# Patient Record
Sex: Female | Born: 1975 | Race: Black or African American | Hispanic: No | Marital: Single | Smoking: Current some day smoker
Health system: Southern US, Community
[De-identification: ages and names within clinical notes are randomized; demographics above are authoritative.]

## PROBLEM LIST (undated history)

## (undated) DIAGNOSIS — F419 Anxiety disorder, unspecified: Secondary | ICD-10-CM

## (undated) DIAGNOSIS — F329 Major depressive disorder, single episode, unspecified: Secondary | ICD-10-CM

## (undated) DIAGNOSIS — K3184 Gastroparesis: Secondary | ICD-10-CM

## (undated) DIAGNOSIS — I10 Essential (primary) hypertension: Secondary | ICD-10-CM

## (undated) DIAGNOSIS — E119 Type 2 diabetes mellitus without complications: Secondary | ICD-10-CM

## (undated) HISTORY — DX: Gastroparesis: K31.84

## (undated) HISTORY — PX: COLONOSCOPY: SHX174

## (undated) HISTORY — PX: APPENDECTOMY: SHX54

## (undated) HISTORY — PX: UPPER GASTROINTESTINAL ENDOSCOPY: SHX188

## (undated) HISTORY — DX: Major depressive disorder, single episode, unspecified: F32.9

## (undated) HISTORY — PX: ENDOMETRIAL ABLATION: SHX621

## (undated) HISTORY — PX: HERNIA REPAIR: SHX51

## (undated) HISTORY — PX: TUBAL LIGATION: SHX77

---

## 1997-08-09 ENCOUNTER — Other Ambulatory Visit: Admission: RE | Admit: 1997-08-09 | Discharge: 1997-08-09 | Payer: Self-pay | Admitting: Obstetrics

## 1998-01-01 ENCOUNTER — Encounter: Admission: RE | Admit: 1998-01-01 | Discharge: 1998-01-01 | Payer: Self-pay | Admitting: Family Medicine

## 1998-01-23 ENCOUNTER — Encounter: Admission: RE | Admit: 1998-01-23 | Discharge: 1998-01-23 | Payer: Self-pay | Admitting: Family Medicine

## 1998-05-12 ENCOUNTER — Emergency Department (HOSPITAL_COMMUNITY): Admission: EM | Admit: 1998-05-12 | Discharge: 1998-05-12 | Payer: Self-pay | Admitting: Emergency Medicine

## 1998-06-25 ENCOUNTER — Encounter: Admission: RE | Admit: 1998-06-25 | Discharge: 1998-06-25 | Payer: Self-pay | Admitting: Family Medicine

## 1998-07-01 ENCOUNTER — Other Ambulatory Visit: Admission: RE | Admit: 1998-07-01 | Discharge: 1998-07-01 | Payer: Self-pay | Admitting: Sports Medicine

## 1998-07-01 ENCOUNTER — Encounter: Admission: RE | Admit: 1998-07-01 | Discharge: 1998-07-01 | Payer: Self-pay | Admitting: Family Medicine

## 1998-07-15 ENCOUNTER — Encounter: Admission: RE | Admit: 1998-07-15 | Discharge: 1998-07-15 | Payer: Self-pay | Admitting: Family Medicine

## 1998-11-20 ENCOUNTER — Encounter: Admission: RE | Admit: 1998-11-20 | Discharge: 1998-11-20 | Payer: Self-pay | Admitting: Family Medicine

## 1999-04-08 ENCOUNTER — Encounter: Admission: RE | Admit: 1999-04-08 | Discharge: 1999-04-08 | Payer: Self-pay | Admitting: Family Medicine

## 1999-04-24 ENCOUNTER — Encounter: Admission: RE | Admit: 1999-04-24 | Discharge: 1999-04-24 | Payer: Self-pay | Admitting: Family Medicine

## 1999-06-14 ENCOUNTER — Emergency Department (HOSPITAL_COMMUNITY): Admission: EM | Admit: 1999-06-14 | Discharge: 1999-06-15 | Payer: Self-pay | Admitting: *Deleted

## 1999-08-18 ENCOUNTER — Other Ambulatory Visit: Admission: RE | Admit: 1999-08-18 | Discharge: 1999-08-18 | Payer: Self-pay | Admitting: Obstetrics

## 1999-09-08 ENCOUNTER — Inpatient Hospital Stay (HOSPITAL_COMMUNITY): Admission: AD | Admit: 1999-09-08 | Discharge: 1999-09-08 | Payer: Self-pay | Admitting: Obstetrics

## 2000-03-02 ENCOUNTER — Inpatient Hospital Stay (HOSPITAL_COMMUNITY): Admission: AD | Admit: 2000-03-02 | Discharge: 2000-03-02 | Payer: Self-pay | Admitting: Obstetrics

## 2000-03-18 ENCOUNTER — Inpatient Hospital Stay (HOSPITAL_COMMUNITY): Admission: AD | Admit: 2000-03-18 | Discharge: 2000-03-21 | Payer: Self-pay | Admitting: Obstetrics

## 2000-10-12 ENCOUNTER — Emergency Department (HOSPITAL_COMMUNITY): Admission: EM | Admit: 2000-10-12 | Discharge: 2000-10-12 | Payer: Self-pay | Admitting: Emergency Medicine

## 2000-10-12 ENCOUNTER — Encounter: Payer: Self-pay | Admitting: Emergency Medicine

## 2001-12-03 ENCOUNTER — Emergency Department (HOSPITAL_COMMUNITY): Admission: EM | Admit: 2001-12-03 | Discharge: 2001-12-03 | Payer: Self-pay | Admitting: Emergency Medicine

## 2002-06-07 ENCOUNTER — Other Ambulatory Visit: Admission: RE | Admit: 2002-06-07 | Discharge: 2002-06-07 | Payer: Self-pay | Admitting: Obstetrics and Gynecology

## 2004-04-17 ENCOUNTER — Ambulatory Visit (HOSPITAL_COMMUNITY): Admission: RE | Admit: 2004-04-17 | Discharge: 2004-04-17 | Payer: Self-pay | Admitting: Obstetrics & Gynecology

## 2004-10-14 ENCOUNTER — Observation Stay (HOSPITAL_COMMUNITY): Admission: AD | Admit: 2004-10-14 | Discharge: 2004-10-15 | Payer: Self-pay | Admitting: Obstetrics & Gynecology

## 2004-11-25 ENCOUNTER — Ambulatory Visit (HOSPITAL_COMMUNITY): Admission: RE | Admit: 2004-11-25 | Discharge: 2004-11-25 | Payer: Self-pay | Admitting: Obstetrics & Gynecology

## 2004-11-27 ENCOUNTER — Inpatient Hospital Stay (HOSPITAL_COMMUNITY): Admission: RE | Admit: 2004-11-27 | Discharge: 2004-11-30 | Payer: Self-pay | Admitting: Obstetrics & Gynecology

## 2004-11-27 ENCOUNTER — Encounter (INDEPENDENT_AMBULATORY_CARE_PROVIDER_SITE_OTHER): Payer: Self-pay | Admitting: Specialist

## 2004-12-09 ENCOUNTER — Encounter: Admission: RE | Admit: 2004-12-09 | Discharge: 2005-01-08 | Payer: Self-pay | Admitting: Obstetrics & Gynecology

## 2005-01-09 ENCOUNTER — Encounter: Admission: RE | Admit: 2005-01-09 | Discharge: 2005-02-07 | Payer: Self-pay | Admitting: Obstetrics & Gynecology

## 2005-02-08 ENCOUNTER — Encounter: Admission: RE | Admit: 2005-02-08 | Discharge: 2005-03-10 | Payer: Self-pay | Admitting: Obstetrics & Gynecology

## 2005-03-11 ENCOUNTER — Encounter: Admission: RE | Admit: 2005-03-11 | Discharge: 2005-04-09 | Payer: Self-pay | Admitting: Obstetrics & Gynecology

## 2005-04-10 ENCOUNTER — Encounter: Admission: RE | Admit: 2005-04-10 | Discharge: 2005-05-10 | Payer: Self-pay | Admitting: Obstetrics & Gynecology

## 2005-05-11 ENCOUNTER — Encounter: Admission: RE | Admit: 2005-05-11 | Discharge: 2005-06-10 | Payer: Self-pay | Admitting: Obstetrics & Gynecology

## 2005-06-10 IMAGING — US US OB TRANSVAGINAL MODIFY
1 series · 18 of 28 positions shown · non-contrast
Comparison: none

CLINICAL DATA: Uncertain gestational age.
 EARLY OBSTETRICAL ULTRASOUND WITH TRANSVAGINAL:
 Transabdominal and endovaginal images were obtained.
 There is a single living intrauterine gestation with crown rump length indicating a gestational age of 6 weeks 4 days and an EDC of 12/07/04.  The fetal heart rate is regular and within normal limits, measuring 109 bpm.  A small remote subchorionic hemorrhage is noted measuring approximately 1 cm in greatest width.
 Both ovaries have a normal appearance and there is no free pelvic fluid.

[Series 1: us ob comp<14 wk · 18 of 38 slices shown]
[im 1/38]
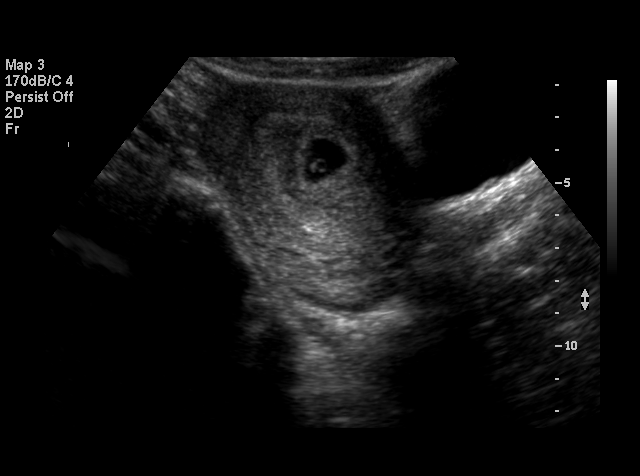
[im 3/38]
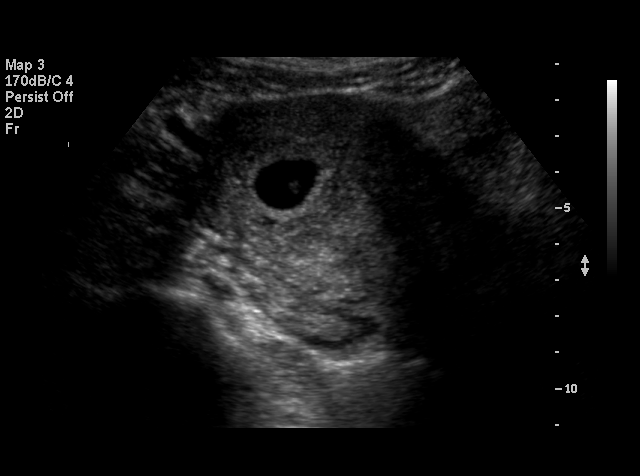
[im 5/38]
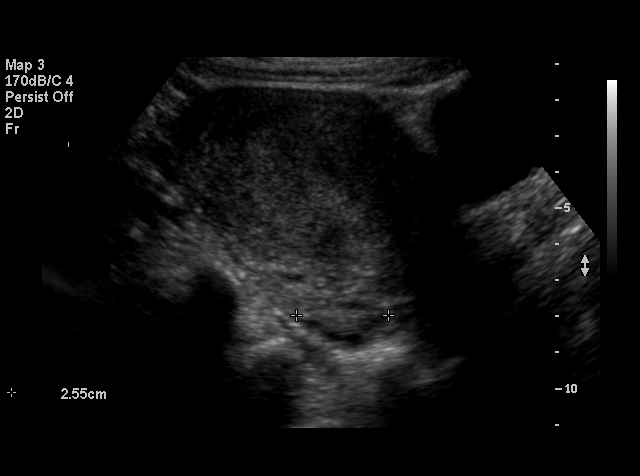
[im 7/38]
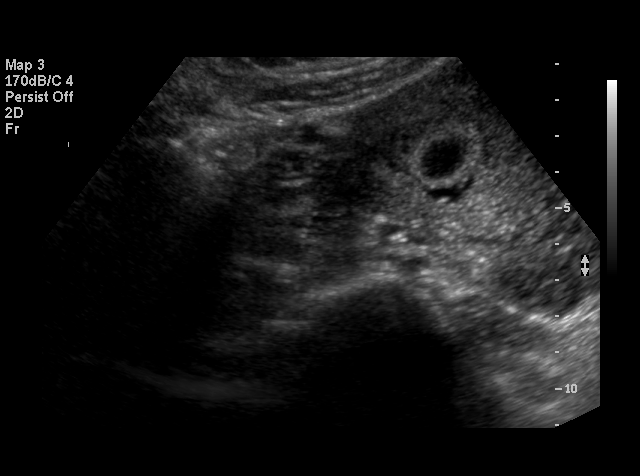
[im 10/38]
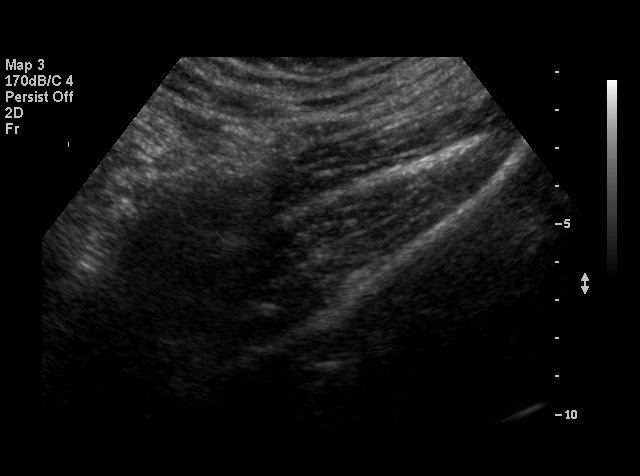
[im 11/38]
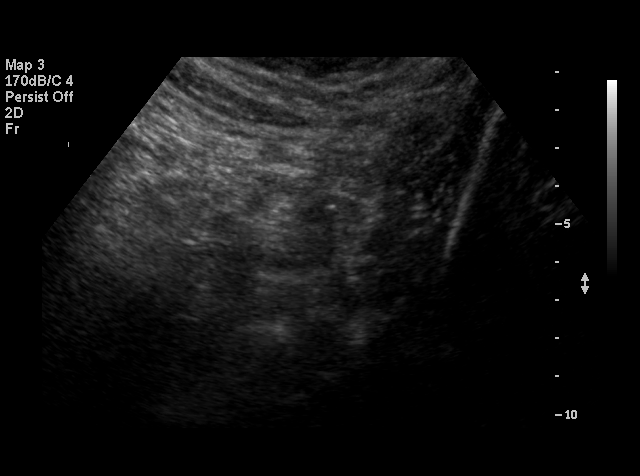
[im 14/38]
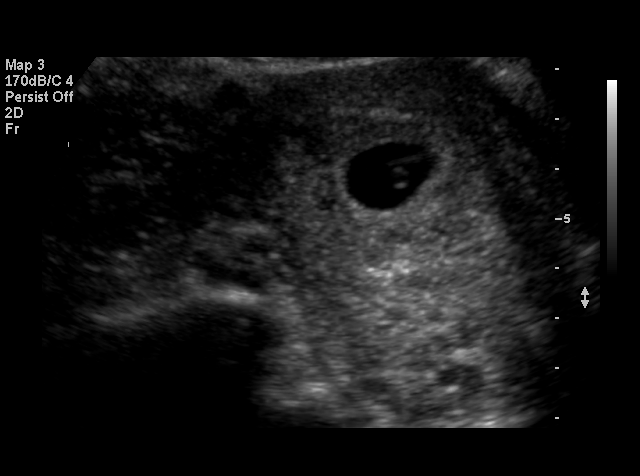
[im 16/38]
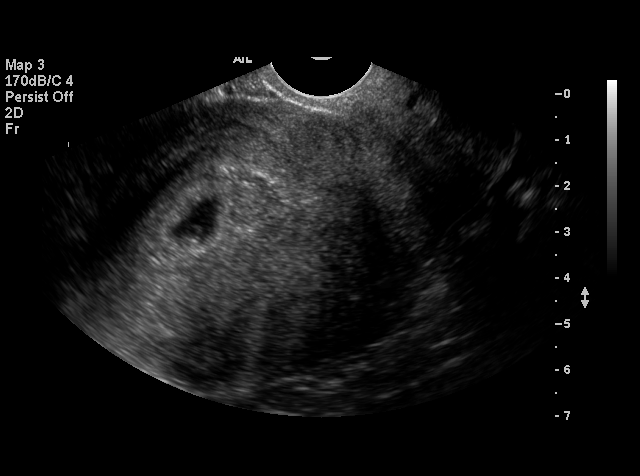
[im 18/38]
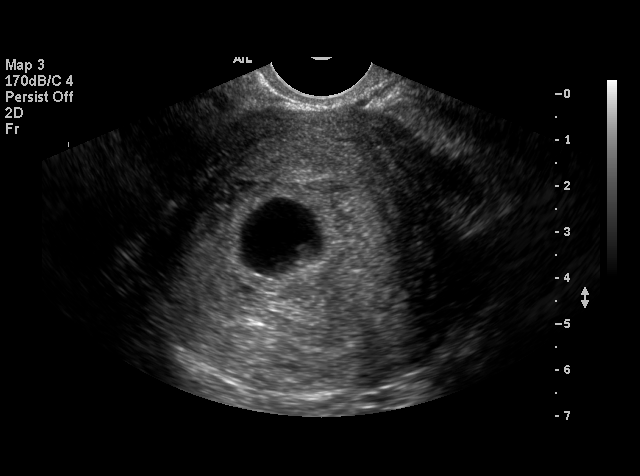
[im 20/38]
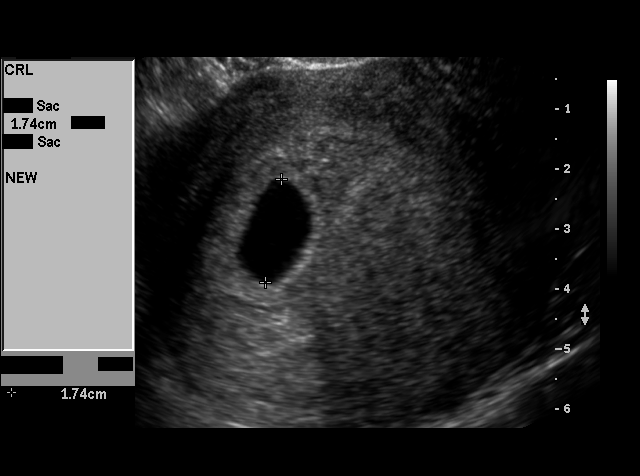
[im 22/38]
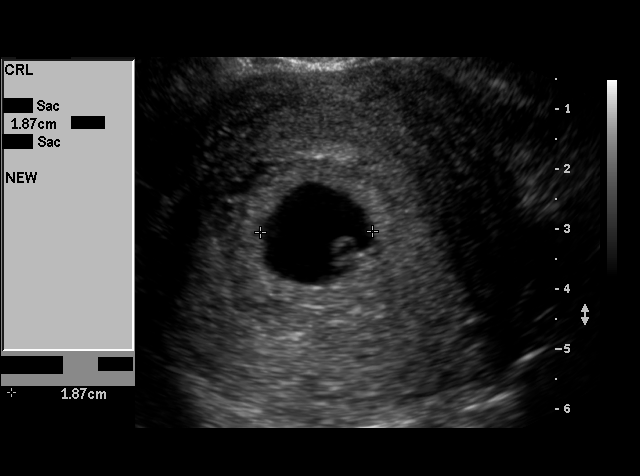
[im 24/38]
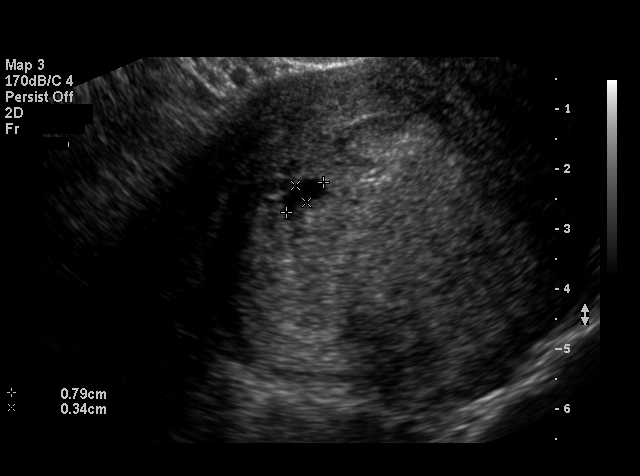
[im 27/38]
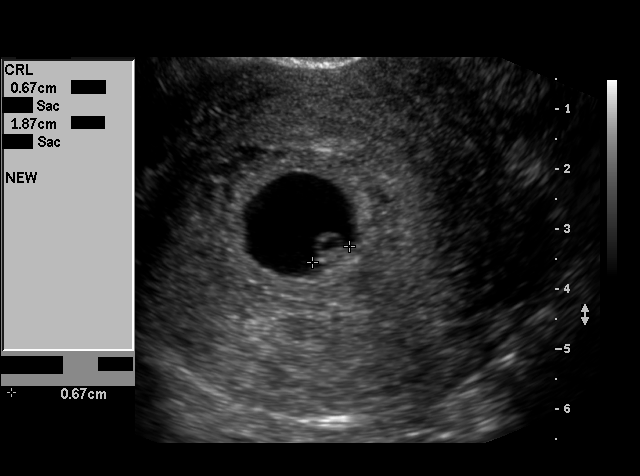
[im 29/38]
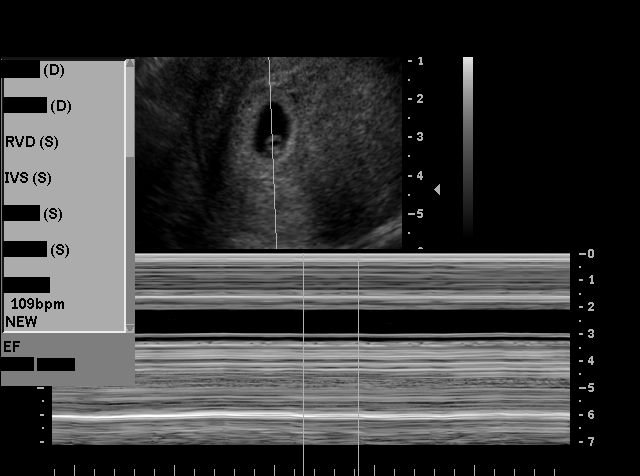
[im 31/38]
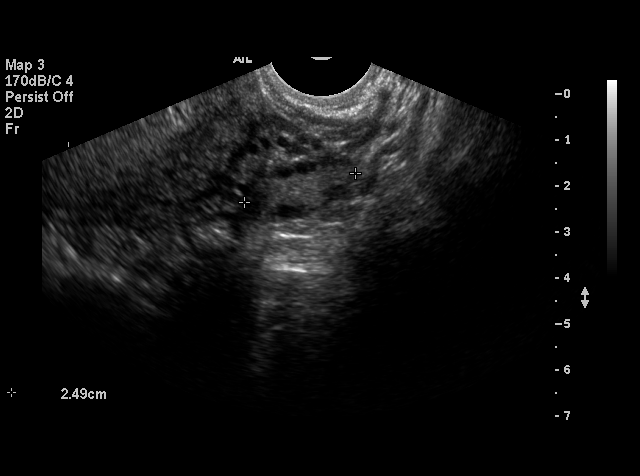
[im 33/38]
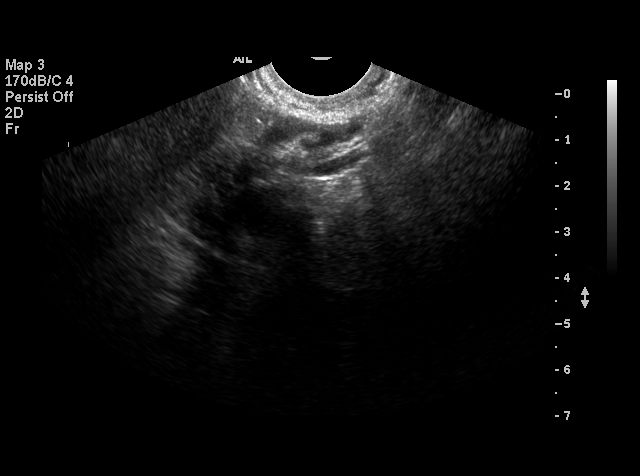
[im 35/38]
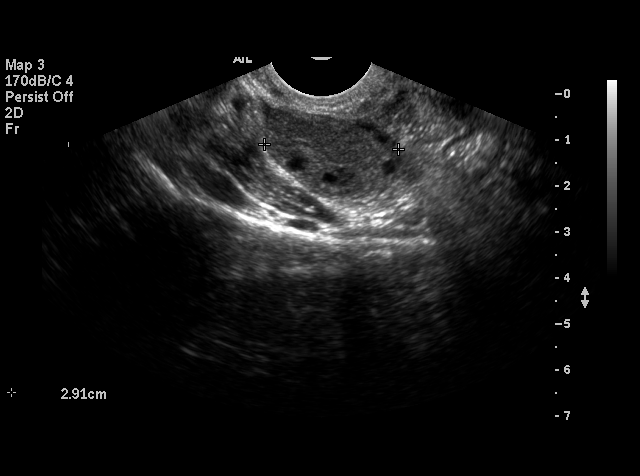
[im 38/38]
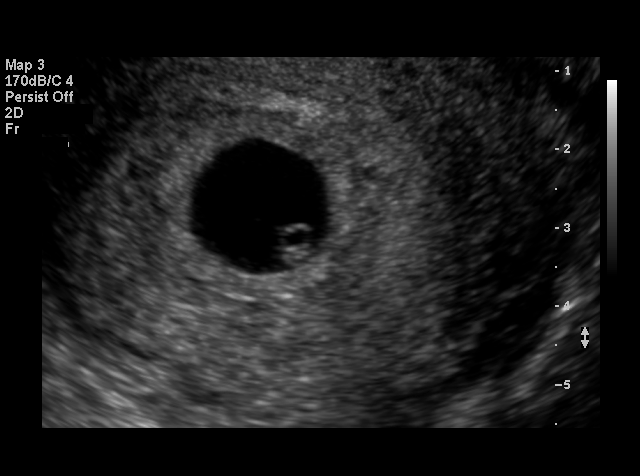

[18 of 28 positions shown; findings below may reference images not displayed]

IMPRESSION: 1.  Single living intrauterine gestation with a crown rump length indicating a gestational age of 6 weeks 4 days and an EDC of 12/07/04.
 2.  Complete anatomy scan at 19 ? 21 weeks is recommended.

## 2005-06-11 ENCOUNTER — Encounter: Admission: RE | Admit: 2005-06-11 | Discharge: 2005-07-08 | Payer: Self-pay | Admitting: Obstetrics & Gynecology

## 2005-07-09 ENCOUNTER — Encounter: Admission: RE | Admit: 2005-07-09 | Discharge: 2005-08-08 | Payer: Self-pay | Admitting: Obstetrics & Gynecology

## 2005-08-09 ENCOUNTER — Encounter: Admission: RE | Admit: 2005-08-09 | Discharge: 2005-09-07 | Payer: Self-pay | Admitting: Obstetrics & Gynecology

## 2005-09-08 ENCOUNTER — Encounter: Admission: RE | Admit: 2005-09-08 | Discharge: 2005-10-08 | Payer: Self-pay | Admitting: Obstetrics & Gynecology

## 2007-10-12 ENCOUNTER — Ambulatory Visit (HOSPITAL_COMMUNITY): Admission: RE | Admit: 2007-10-12 | Discharge: 2007-10-12 | Payer: Self-pay | Admitting: Obstetrics and Gynecology

## 2007-10-12 ENCOUNTER — Encounter (INDEPENDENT_AMBULATORY_CARE_PROVIDER_SITE_OTHER): Payer: Self-pay | Admitting: Obstetrics and Gynecology

## 2009-12-23 ENCOUNTER — Emergency Department (HOSPITAL_COMMUNITY)
Admission: EM | Admit: 2009-12-23 | Discharge: 2009-12-23 | Payer: Self-pay | Source: Home / Self Care | Admitting: Family Medicine

## 2010-07-10 LAB — HIV ANTIBODY (ROUTINE TESTING W REFLEX): HIV: NONREACTIVE

## 2010-07-10 LAB — POCT URINALYSIS DIPSTICK
Glucose, UA: NEGATIVE mg/dL
Nitrite: NEGATIVE
Protein, ur: NEGATIVE mg/dL
Urobilinogen, UA: 0.2 mg/dL (ref 0.0–1.0)

## 2010-07-10 LAB — GC/CHLAMYDIA PROBE AMP, GENITAL
Chlamydia, DNA Probe: NEGATIVE
GC Probe Amp, Genital: NEGATIVE

## 2010-07-10 LAB — WET PREP, GENITAL: Trich, Wet Prep: NONE SEEN

## 2010-07-10 LAB — POCT PREGNANCY, URINE: Preg Test, Ur: NEGATIVE

## 2010-09-08 NOTE — Op Note (Signed)
NAMEJASIYA, MARKIE NO.:  [id number]   MEDICAL RECORD NO.:  [id number]          PATIENT TYPE:  AMB   LOCATION:  SDC                           FACILITY:  [facility]   PHYSICIAN:  Lenoard Aden, M.D.DATE OF BIRTH:  Apr 07, 1976   DATE OF PROCEDURE:  10/12/2007  DATE OF DISCHARGE:                               OPERATIVE REPORT   PREOPERATIVE DIAGNOSES:  Menorrhagia and dysmenorrhea.   POSTOPERATIVE DIAGNOSES:  Menorrhagia, dysmenorrhea, plus anterior wall  uterine endometrial polyp.   PROCEDURE:  Diagnostic hysteroscopy, dilation and curettage,  polypectomy, NovaSure endometrial ablation.   ANESTHESIA:  General.   BLOOD LOSS:  Less than 2 mL.   SURGEON:  Lenoard Aden, MD.   COMPLICATIONS:  None.   DRAINS:  None.   COUNTS:  Correct.   Patient to recovery in good condition.   SPECIMEN:  Endometrial curettings to pathology.   Fluid deficit of 50 mL.   BRIEF OPERATIVE NOTE:  Being apprised of risks of anesthesia, infection,  bleeding, injury to abdominal organs, need for repair, the patient was  brought to the operating room where she was administered general  anesthetic without complications, prepped and draped in the usual  sterile fashion.  Catheter was placed.  Bladder was emptied.  Exam under  anesthesia reveals anteflexed uterus and no adnexal masses.  Dilute  Pitressin solution 16 mL total placed at 3 and 9 o'clock cervicovaginal  junction, dilute paracervical block 30 mL.  Scope placed in standard  fashion at 4 and 8 o' clock.  A single-tooth tenaculum was placed,  grasped with the cervix.  Uterus sounds to 9 cm, cervical length of 2  cm.  Cervix was easily dilated up to a #27 Pratt dilator.  Hysteroscope  was placed.  Visualization of a large anterior wall polyp, bilateral  normal tubal ostia.  At this time, polyp forceps was placed and removal  of endometrial polyp was done without difficulty.  D&C was performed.  Endometrium curettings were  visualized.  Revisualization in the cavity  reveals the complete removal of the anterior wall endometrial polyp.  At  this time, NovaSure device was placed and seated in the proper fashion.  CO2 test was performed and was negative.  Cavity width of 4 cm noted.  At this time, NovaSure was initiated and terminated as noted.  Instruments were removed  without difficulty.  Revisualization reveals a well-ablated endometrium  and no evidence of perforation.  Good hemostasis was achieved.  All  incisions were removed.  The patient tolerated the procedure and was  transferred to recovery room in good condition.      Lenoard Aden, M.D.  Electronically Signed     RJT/MEDQ  D:  10/12/2007  T:  10/13/2007  Job:  [id number]

## 2010-09-08 NOTE — H&P (Signed)
NAMEROMEY, COHEA NO.:  [id number]   MEDICAL RECORD NO.:  [id number]          PATIENT TYPE:  AMB   LOCATION:  SDC                           FACILITY:  [facility]   PHYSICIAN:  Lenoard Aden, M.D.DATE OF BIRTH:  1975-06-19   DATE OF ADMISSION:  DATE OF DISCHARGE:                              HISTORY & PHYSICAL   CHIEF COMPLAINT:  Dysmenorrhea, menorrhagia.   She is a 35 year old African-American female, G7, P4, who presents for  NovaSure, hysteroscopy, D&C.   She has no known drug allergies.   Medications previously were phentermine.   She is a nonsmoker, nondrinker.  Denies domestic or physical violence.   History of three vaginal deliveries and one C-section.  History of tubal  ligation.   PHYSICAL EXAMINATION:  She is a well-developed and well-nourished  African-American female in no acute distress.  HEENT:  Normal.  LUNGS:  Clear.  HEART:  Regular rate and rhythm.  ABDOMEN:  Soft, nontender.  PELVIC:  The uterus to be normal size, shape, and contour, bulky,  slightly enlarged.  No adnexal masses.  Uterine position is anteverted.   IMPRESSION:  Dysmenorrhea, menorrhagia with secondary to anemia.  Previous hemoglobin 10.2.   PLAN:  Proceed with diagnostic hysteroscopy, D&C, possible resectoscopic  polypectomy, NovaSure endometrial ablation.  The risks of anesthesia,  infection, bleeding, injury to intra-abdominal organs,  need for  __________ is discussed.  __________ complications, to include bowel and  bladder injury are noted, possible need for laparotomy with need for  repair of injured abdominal organs discussed, and possible inability to  cure pelvic pain and dysmenorrhea noted.  Patient acknowledges and  wishes to proceed.      Lenoard Aden, M.D.  Electronically Signed     RJT/MEDQ  D:  10/11/2007  T:  10/11/2007  Job:  [id number]

## 2010-09-11 NOTE — Op Note (Signed)
NAMEGURNEET, Gwendolyn Carrillo NO.:  [id number]   MEDICAL RECORD NO.:  [id number]          PATIENT TYPE:  INP   LOCATION:  9117                          FACILITY:  [facility]   PHYSICIAN:  Roseanna Rainbow, M.D.DATE OF BIRTH:  1975/12/03   DATE OF PROCEDURE:  11/27/2004  DATE OF DISCHARGE:                                 OPERATIVE REPORT   PREOPERATIVE DIAGNOSES:  1.  Intrauterine pregnancy at 38 plus weeks.  2.  Gestational diabetes, agent-requiring.  3.  Suspected large for gestational age fetus.  4.  Desires sterilization.   POSTOPERATIVE DIAGNOSES:  1.  Intrauterine pregnancy at 38 plus weeks.  2.  Gestational diabetes, agent-requiring.  3.  Suspected large for gestational age fetus.  4.  Desires sterilization.   PROCEDURES:  1.  Primary low uterine flap elliptical cesarean delivery.  2.  Modified Pomeroy bilateral tubal ligation via a Pfannenstiel skin      incision.   SURGEON:  Roseanna Rainbow, M.D.   ASSISTANT:  Bing Neighbors. Clearance Coots, M.D.   ANESTHESIA:  Spinal.   ESTIMATED BLOOD LOSS:  600 mL.   IV FLUIDS:  As per anesthesiology.   COMPLICATIONS:  None.   PROCEDURE:  The patient was taken to the operating room with an IV running.  A spinal anesthetic was then placed without difficulty.  The patient was  placed in the dorsal supine position with a leftward tilt and prepped and  draped in the usual sterile fashion.  A Pfannenstiel skin incision was then  made with a scalpel and carried down to the underlying fascia with the  Bovie.  The fascia was nicked in the midline.  The fascial incision was then  extended bilaterally with curved Mayo scissors.  The superior aspect of the  fascial incision was then tented up with Kocher clamps and the underlying  rectus muscles dissected off.  The inferior aspect of the fascial incision  was manipulated in a similar fashion.  The rectus muscles were separated in  midline.  The parietal peritoneum was tented up  and entered sharply.  The  peritoneal incision was then extended superiorly and inferiorly with good  visualization of the bladder.  The bladder blade was then placed.  The  vesicouterine peritoneum was tented up and entered sharply with Metzenbaum  scissors. This incision was then extended bilaterally and the bladder flap  created bluntly.  The lower uterine segment was then incised in a transverse  fashion with the scalpel.  The uterine incision was extended with bandage  scissors.  The infant's head was then delivered atraumatically.  The  oropharynx was suctioned with bulb suction.  The cord was clamped and cut.  The infant was handed off to the awaiting neonatologist.  Apgars were 9 and  9 at one and five minutes, respectively.  The placenta was then removed.  The intrauterine cavity was evacuated of any remaining amniotic fluid, clots  and debris with moistened laparotomy sponge.  The uterus was then  exteriorized.  The uterine incision was reapproximated in a running  interlocking fashion using 0 Monocryl.  Adequate hemostasis  was noted.  At  this point the midisthmic portion of the left fallopian tube was then  grasped with a Babcock clamp.  Two ligatures of 0 plain were placed around a  2 cm segment of tube.  The tube was excised.  Adequate hemostasis was noted.  The right fallopian tube was manipulated in a similar fashion.  The uterus  was returned to the abdomen.  The paracolic gutters were copiously  irrigated.  The parietal peritoneum was reapproximated in a running fashion  with 2-0 Vicryl.  The fascia was reapproximated in a running fashion with 0  Vicryl.  The skin was reapproximated with staples.  At the close of the  procedure, the instrument and pack counts were said to be correct x2.  Cefazolin 1 g was given at cord clamp.  The patient was taken to the PACU  awake and in stable condition.       LAJ/MEDQ  D:  11/27/2004  T:  11/27/2004  Job:  [id number]

## 2011-01-21 LAB — CBC
HCT: 35 — ABNORMAL LOW
Platelets: 271
RDW: 14.1
WBC: 8

## 2011-10-01 ENCOUNTER — Emergency Department (HOSPITAL_COMMUNITY)
Admission: EM | Admit: 2011-10-01 | Discharge: 2011-10-01 | Disposition: A | Discharge status: Home / Self Care | Payer: 59 | Attending: Emergency Medicine | Admitting: Emergency Medicine

## 2011-10-01 ENCOUNTER — Encounter (HOSPITAL_COMMUNITY): Payer: Self-pay | Admitting: *Deleted

## 2011-10-01 DIAGNOSIS — R0602 Shortness of breath: Secondary | ICD-10-CM | POA: Insufficient documentation

## 2011-10-01 DIAGNOSIS — F17200 Nicotine dependence, unspecified, uncomplicated: Secondary | ICD-10-CM | POA: Insufficient documentation

## 2011-10-01 DIAGNOSIS — I471 Supraventricular tachycardia, unspecified: Secondary | ICD-10-CM | POA: Insufficient documentation

## 2011-10-01 DIAGNOSIS — I10 Essential (primary) hypertension: Secondary | ICD-10-CM | POA: Insufficient documentation

## 2011-10-01 DIAGNOSIS — F411 Generalized anxiety disorder: Secondary | ICD-10-CM | POA: Insufficient documentation

## 2011-10-01 DIAGNOSIS — R079 Chest pain, unspecified: Secondary | ICD-10-CM | POA: Insufficient documentation

## 2011-10-01 DIAGNOSIS — Z79899 Other long term (current) drug therapy: Secondary | ICD-10-CM | POA: Insufficient documentation

## 2011-10-01 DIAGNOSIS — I951 Orthostatic hypotension: Secondary | ICD-10-CM | POA: Insufficient documentation

## 2011-10-01 HISTORY — DX: Essential (primary) hypertension: I10

## 2011-10-01 HISTORY — DX: Anxiety disorder, unspecified: F41.9

## 2011-10-01 LAB — CBC
Hemoglobin: 13.2 g/dL (ref 12.0–15.0)
MCH: 31.4 pg (ref 26.0–34.0)
Platelets: 360 10*3/uL (ref 150–400)
RBC: 4.21 MIL/uL (ref 3.87–5.11)
WBC: 13.4 10*3/uL — ABNORMAL HIGH (ref 4.0–10.5)

## 2011-10-01 LAB — BASIC METABOLIC PANEL
BUN: 14 mg/dL (ref 6–23)
CO2: 22 mEq/L (ref 19–32)
Chloride: 103 mEq/L (ref 96–112)
GFR calc non Af Amer: >90 mL/min (ref >90)
Glucose, Bld: 114 mg/dL — ABNORMAL HIGH (ref 70–99)
Potassium: 3.6 mEq/L (ref 3.5–5.1)
Sodium: 138 mEq/L (ref 135–145)

## 2011-10-01 LAB — POCT I-STAT TROPONIN I

## 2011-10-01 LAB — DIFFERENTIAL
Lymphocytes Relative: 29 % (ref 12–46)
Lymphs Abs: 3.9 10*3/uL (ref 0.7–4.0)
Monocytes Relative: 8 % (ref 3–12)
Neutro Abs: 8.5 10*3/uL — ABNORMAL HIGH (ref 1.7–7.7)
Neutrophils Relative %: 63 % (ref 43–77)

## 2011-10-01 MED ORDER — METOPROLOL SUCCINATE ER 50 MG PO TB24: 50.0000 mg | ORAL_TABLET | Freq: Every day | ORAL | Status: DC

## 2011-10-01 MED ORDER — SODIUM CHLORIDE 0.9 % IV BOLUS (SEPSIS)
1000.0000 mL | Freq: Once | INTRAVENOUS | Status: AC
  Administered 2011-10-01: 1000 mL via INTRAVENOUS

## 2011-10-01 MED ORDER — ADENOSINE 6 MG/2ML IV SOLN
INTRAVENOUS | Status: AC
  Administered 2011-10-01: 6 mg via INTRAVENOUS
  Filled 2011-10-01: qty 6

## 2011-10-01 MED ORDER — ADENOSINE 6 MG/2ML IV SOLN
6.0000 mg | Freq: Once | INTRAVENOUS | Status: AC
  Administered 2011-10-01: 6 mg via INTRAVENOUS

## 2011-10-01 NOTE — ED Notes (Signed)
UVO:[id number]<YQ> Expected date:<BR> Expected time:11:35 AM<BR> Means of arrival:<BR> Comments:<BR> M61 - 36yoF Anxiety attack

## 2011-10-01 NOTE — ED Notes (Signed)
Pt reports left chest pain/pressure that started last night, states "I thought it was just anxiety but it will not go away." pt reports pressure to left chest that radiates to left shoulder. Also c/o SOB and dizziness while driving to ER, pt had to call EMS to transport her here.

## 2011-10-01 NOTE — ED Notes (Signed)
Adenosine given, pt converted to ST no ectopy noted. Pt reports relief of chest pain/pressure. States "I feel much better now."

## 2011-10-01 NOTE — ED Provider Notes (Addendum)
History     CSN: [id number]  Arrival date & time 10/01/11  1138   First MD Initiated Contact with Patient 10/01/11 1145      Chief Complaint  Patient presents with  . Anxiety  . Chest Pain    (Consider location/radiation/quality/duration/timing/severity/associated sxs/prior treatment) Patient is a 36 y.o. female presenting with anxiety and chest pain. The history is provided by the patient and the EMS personnel. The history is limited by the condition of the patient.  Anxiety Associated symptoms include chest pain.  Chest Pain   the pt is a 43 y female with hx htn and anxiety attacks.  She c/o anxiety, fast hr, cp, sob since last night. She denies n/v/f/c/cough.   She smokes cigars. She has not used OTC cold meds recently.  She does not drink a lot of caffeine.  Level 5 caveat applies for urgent need for intervention.   PSVT with HR 202 and mild hypotension.  Past Medical History  Diagnosis Date  . Hypertension   . Anxiety     Past Surgical History  Procedure Date  . Endometrial ablation   . Cesarean section     History reviewed. No pertinent family history.  History  Substance Use Topics  . Smoking status: Current Everyday Smoker    Types: Cigars  . Smokeless tobacco: Not on file  . Alcohol Use: Yes     occ    OB History    Grav Para Term Preterm Abortions TAB SAB Ect Mult Living                  Review of Systems  Unable to perform ROS Cardiovascular: Positive for chest pain.    Allergies  Review of patient's allergies indicates no known allergies.  Home Medications   Current Outpatient Rx  Name Route Sig Dispense Refill  . LISINOPRIL 10 MG PO TABS Oral Take 10 mg by mouth daily.    Gaylord Shih TRI-CYCLEN (28) PO Oral Take 1 tablet by mouth daily.      BP 94/64  Pulse 101  Resp 18  SpO2 100%  Physical Exam  Nursing note and vitals reviewed. Constitutional: She is oriented to person, place, and time. She appears well-developed and  well-nourished. No distress.  HENT:  Head: Normocephalic and atraumatic.  Eyes: Conjunctivae are normal.  Neck: Normal range of motion.  Cardiovascular: Regular rhythm.   No murmur heard.      tachycardia  Pulmonary/Chest: Effort normal. She has no rales.  Abdominal: Soft. Bowel sounds are normal. She exhibits no distension.  Musculoskeletal: Normal range of motion.  Neurological: She is alert and oriented to person, place, and time.  Skin: Skin is warm and dry.  Psychiatric: She has a normal mood and affect. Thought content normal.    ED Course  Procedures (including critical care time) PSVT Resolved with IV adenosine 6 mg.   Labs Reviewed  CBC  DIFFERENTIAL  BASIC METABOLIC PANEL   No results found.   No diagnosis found.  ECG PSVT with heart rate 202 beats per minute. Normal axis. Normal intervals. Nonspecific T-wave changes. PSVT  ECG after adenosine Sinus tachycardia with heart rate 118 beats per minute. Normal axis. Normal intervals. Normal.  ST and T waves. Sinus tachycardia  No orthostatic sxs after IVF and rate control  I spoke with Dr. Sharyn Lull. He said stop licinopril hctz and start toprol.  F/u in office next week.  MDM  psvt        Tinnie Gens  Mickaela Starlin, MD 10/01/11 1610  Cheri Guppy, MD 10/01/11 1525

## 2011-10-01 NOTE — Discharge Instructions (Signed)
Your blood tests do not show any damage to your heart.  Stop your lisinopril.  Use Toprol to control your heart rate.  Followup with Dr. her only next Monday or Tuesday.  Call for appointment time.  Return for worse or uncontrolled symptoms

## 2011-10-01 NOTE — ED Notes (Signed)
Pts HR noted to be 205. EKG captured and shown to Dr. Weldon Inches. Dr. Weldon Inches at bedside to eval pt. VORBV for Adenosine.

## 2011-12-15 ENCOUNTER — Emergency Department (HOSPITAL_COMMUNITY)
Admission: EM | Admit: 2011-12-15 | Discharge: 2011-12-15 | Disposition: A | Discharge status: Home / Self Care | Payer: 59 | Attending: Emergency Medicine | Admitting: Emergency Medicine

## 2011-12-15 DIAGNOSIS — Z79899 Other long term (current) drug therapy: Secondary | ICD-10-CM | POA: Insufficient documentation

## 2011-12-15 DIAGNOSIS — I471 Supraventricular tachycardia: Secondary | ICD-10-CM

## 2011-12-15 DIAGNOSIS — I10 Essential (primary) hypertension: Secondary | ICD-10-CM | POA: Insufficient documentation

## 2011-12-15 DIAGNOSIS — F411 Generalized anxiety disorder: Secondary | ICD-10-CM | POA: Insufficient documentation

## 2011-12-15 DIAGNOSIS — R42 Dizziness and giddiness: Secondary | ICD-10-CM | POA: Insufficient documentation

## 2011-12-15 DIAGNOSIS — R079 Chest pain, unspecified: Secondary | ICD-10-CM | POA: Insufficient documentation

## 2011-12-15 DIAGNOSIS — I498 Other specified cardiac arrhythmias: Secondary | ICD-10-CM | POA: Insufficient documentation

## 2011-12-15 DIAGNOSIS — F17200 Nicotine dependence, unspecified, uncomplicated: Secondary | ICD-10-CM | POA: Insufficient documentation

## 2011-12-15 MED ORDER — ADENOSINE 6 MG/2ML IV SOLN
INTRAVENOUS | Status: AC
  Administered 2011-12-15: 6 mg via INTRAVENOUS
  Filled 2011-12-15: qty 6

## 2011-12-15 NOTE — ED Notes (Signed)
EDP gave permission for pt to have something to eat and drink

## 2011-12-15 NOTE — ED Notes (Signed)
MD at bedside. 

## 2011-12-15 NOTE — ED Notes (Signed)
Pt states she feels like her heart is racing since 9:30 pm. States has had episode of "tachycardia" in June with HR 205.  Also feels chest tightness, SOB, and dizzy.  In SVT with rate 229

## 2011-12-15 NOTE — ED Provider Notes (Signed)
History     CSN: [id number]  Arrival date & time 12/15/11  0129   First MD Initiated Contact with Patient 12/15/11 [phone]      Chief Complaint  Patient presents with  . SVT   . Chest Pain    (Consider location/radiation/quality/duration/timing/severity/associated sxs/prior treatment) HPI 36 year old female presents to emergency department complaining of palpitations. Patient with history of SVT, required adenosine cardioversion in June. Patient reports since around 9:30 she has felt her heart was racing. She initially thought was a panic attack, but found herself to the emergency department was not improving. She is complaining of some shortness of breath and mild chest pressure patient feels dizziness. Patient had workup through Dr. Sharyn Lull, reports he was discussing possible ablation with her, but admits that she doesn't understand his accent very well. Patient was told to cut back on her Adderall use. She took a pill on Friday and then again today. She reports work has been very stressful recently and she is having trouble being a single mom and working a stressful job.  Past Medical History  Diagnosis Date  . Hypertension   . Anxiety     Past Surgical History  Procedure Date  . Endometrial ablation   . Cesarean section     No family history on file.  History  Substance Use Topics  . Smoking status: Current Everyday Smoker    Types: Cigars  . Smokeless tobacco: Not on file  . Alcohol Use: Yes     occ    OB History    Grav Para Term Preterm Abortions TAB SAB Ect Mult Living                  Review of Systems  All other systems reviewed and are negative.    Allergies  Review of patient's allergies indicates no known allergies.  Home Medications   Current Outpatient Rx  Name Route Sig Dispense Refill  . ACETAMINOPHEN 500 MG PO TABS Oral Take 1,000 mg by mouth every 6 (six) hours as needed. For pain.    Marland Kitchen AMPHETAMINE-DEXTROAMPHETAMINE 10 MG PO TABS Oral Take  10 mg by mouth 2 (two) times daily.    Marland Kitchen LISINOPRIL-HYDROCHLOROTHIAZIDE 20-12.5 MG PO TABS Oral Take 1 tablet by mouth daily.    Gaylord Shih TRI-CYCLEN (28) PO Oral Take 1 tablet by mouth daily.    Marland Kitchen VITAMIN C 500 MG PO TABS Oral Take 1,000 mg by mouth daily.      BP 115/72  Pulse 117  Temp 98.6 F (37 C) (Oral)  Resp 18  SpO2 100%  Physical Exam  Nursing note and vitals reviewed. Constitutional: She is oriented to person, place, and time. She appears well-developed and well-nourished.  HENT:  Head: Normocephalic and atraumatic.  Nose: Nose normal.  Mouth/Throat: Oropharynx is clear and moist.  Eyes: Conjunctivae and EOM are normal. Pupils are equal, round, and reactive to light.  Neck: Normal range of motion. Neck supple. No JVD present. No tracheal deviation present. No thyromegaly present.  Cardiovascular: Regular rhythm, normal heart sounds and intact distal pulses.  Exam reveals no gallop and no friction rub.   No murmur heard.      Tachycardia noted  Pulmonary/Chest: Effort normal and breath sounds normal. No stridor. No respiratory distress. She has no wheezes. She has no rales. She exhibits no tenderness.  Abdominal: Soft. Bowel sounds are normal. She exhibits no distension and no mass. There is no tenderness. There is no rebound and no guarding.  Musculoskeletal: Normal range of motion. She exhibits no edema and no tenderness.  Lymphadenopathy:    She has no cervical adenopathy.  Neurological: She is alert and oriented to person, place, and time. She has normal reflexes. She exhibits normal muscle tone. Coordination normal.  Skin: Skin is warm and dry. No rash noted. No erythema. No pallor.  Psychiatric: She has a normal mood and affect. Her behavior is normal. Judgment and thought content normal.    ED Course  CARDIOVERSION Performed by: Olivia Mackie Authorized by: Olivia Mackie Consent: The procedure was performed in an emergent situation. Risks and benefits: risks,  benefits and alternatives were discussed Consent given by: patient Time out: Immediately prior to procedure a "time out" was called to verify the correct patient, procedure, equipment, support staff and site/side marked as required. Patient sedated: no Cardioversion basis: emergent Pre-procedure rhythm: supraventricular tachycardia Patient position: patient was placed in a supine position Post-procedure rhythm: normal sinus rhythm Patient tolerance: Patient tolerated the procedure well with no immediate complications. Comments: Patient was given 6 mg adenosine rapid push with resolution of SVT   (including critical care time) CRITICAL CARE Performed by: Olivia Mackie   Total critical care time: 35  Critical care time was exclusive of separately billable procedures and treating other patients.  Critical care was necessary to treat or prevent imminent or life-threatening deterioration.  Critical care was time spent personally by me on the following activities: development of treatment plan with patient and/or surrogate as well as nursing, discussions with consultants, evaluation of patient's response to treatment, examination of patient, obtaining history from patient or surrogate, ordering and performing treatments and interventions, ordering and review of laboratory studies, ordering and review of radiographic studies, pulse oximetry and re-evaluation of patient's condition.  Labs Reviewed - No data to display No results found.  Pre adenosine:  Date: 12/15/2011  Rate: 231   Rhythm: sinus tachycardia  QRS Axis: right  Intervals: normal  ST/T Wave abnormalities: nonspecific ST/T changes  Conduction Disutrbances:none  Narrative Interpretation: svt new from prior  Old EKG Reviewed: changes noted   Date: 12/15/2011  Rate: 117  Rhythm: sinus tachycardia  QRS Axis: right  Intervals: normal  ST/T Wave abnormalities: normal  Conduction Disutrbances:none  Narrative Interpretation:  svt resolved  Old EKG Reviewed: changes noted     1. SVT (supraventricular tachycardia)       MDM  36 yo female s/p SVT episode with good response to adenosine x 1.  Discussed with patient avoidance of stimulants and f/u with cardiology. She requests referral to different cardiology group.        Olivia Mackie, MD 12/15/11 [phone]

## 2011-12-15 NOTE — ED Notes (Signed)
Patient placed on Zoll monitor

## 2011-12-15 NOTE — ED Notes (Signed)
Pt states she was feeling her heart racing since 9:30pm. Pt states "It felt like my heart was beating to my back". PT denies chest pain after administration of adenosine 6mg .

## 2012-08-28 ENCOUNTER — Telehealth: Payer: Self-pay | Admitting: Family Medicine

## 2012-08-30 MED ORDER — METOPROLOL SUCCINATE ER 50 MG PO TB24: 50.0000 mg | ORAL_TABLET | Freq: Every day | ORAL | Status: DC

## 2012-08-30 NOTE — Telephone Encounter (Signed)
Rx Refilled  

## 2012-11-06 ENCOUNTER — Telehealth: Payer: Self-pay | Admitting: Family Medicine

## 2012-11-06 MED ORDER — METOPROLOL SUCCINATE ER 50 MG PO TB24: 50.0000 mg | ORAL_TABLET | Freq: Every day | ORAL | Status: DC

## 2012-11-06 NOTE — Telephone Encounter (Signed)
Meds refilled.

## 2012-12-04 ENCOUNTER — Telehealth: Payer: Self-pay | Admitting: Family Medicine

## 2012-12-04 MED ORDER — METOPROLOL SUCCINATE ER 50 MG PO TB24: 50.0000 mg | ORAL_TABLET | Freq: Every day | ORAL | Status: DC

## 2012-12-04 NOTE — Telephone Encounter (Signed)
Rx Refilled for 90 days 

## 2014-05-19 DIAGNOSIS — Z9049 Acquired absence of other specified parts of digestive tract: Secondary | ICD-10-CM | POA: Insufficient documentation

## 2018-07-04 ENCOUNTER — Other Ambulatory Visit: Payer: Self-pay

## 2018-07-04 ENCOUNTER — Emergency Department (HOSPITAL_COMMUNITY): Payer: 59

## 2018-07-04 ENCOUNTER — Encounter (HOSPITAL_COMMUNITY): Payer: Self-pay | Admitting: Emergency Medicine

## 2018-07-04 ENCOUNTER — Emergency Department (HOSPITAL_COMMUNITY)
Admission: EM | Admit: 2018-07-04 | Discharge: 2018-07-04 | Disposition: A | Discharge status: Home / Self Care | Payer: 59 | Attending: Emergency Medicine | Admitting: Emergency Medicine

## 2018-07-04 DIAGNOSIS — J101 Influenza due to other identified influenza virus with other respiratory manifestations: Secondary | ICD-10-CM

## 2018-07-04 DIAGNOSIS — I10 Essential (primary) hypertension: Secondary | ICD-10-CM | POA: Diagnosis not present

## 2018-07-04 DIAGNOSIS — F419 Anxiety disorder, unspecified: Secondary | ICD-10-CM | POA: Diagnosis not present

## 2018-07-04 DIAGNOSIS — R0602 Shortness of breath: Secondary | ICD-10-CM | POA: Diagnosis not present

## 2018-07-04 DIAGNOSIS — F17210 Nicotine dependence, cigarettes, uncomplicated: Secondary | ICD-10-CM | POA: Insufficient documentation

## 2018-07-04 DIAGNOSIS — E1165 Type 2 diabetes mellitus with hyperglycemia: Secondary | ICD-10-CM | POA: Diagnosis present

## 2018-07-04 HISTORY — DX: Type 2 diabetes mellitus without complications: E11.9

## 2018-07-04 LAB — INFLUENZA PANEL BY PCR (TYPE A & B)
INFLAPCR: POSITIVE — AB
Influenza B By PCR: NEGATIVE

## 2018-07-04 LAB — URINALYSIS, ROUTINE W REFLEX MICROSCOPIC
BACTERIA UA: NONE SEEN
BILIRUBIN URINE: NEGATIVE
Glucose, UA: >=500 mg/dL — AB
Ketones, ur: 20 mg/dL — AB
Leukocytes,Ua: NEGATIVE
Nitrite: NEGATIVE
PH: 6 (ref 5.0–8.0)
Protein, ur: NEGATIVE mg/dL
SPECIFIC GRAVITY, URINE: 1.025 (ref 1.005–1.030)

## 2018-07-04 LAB — CBC
HEMATOCRIT: 38.2 % (ref 36.0–46.0)
Hemoglobin: 13.5 g/dL (ref 12.0–15.0)
MCH: 31.9 pg (ref 26.0–34.0)
MCHC: 35.3 g/dL (ref 30.0–36.0)
MCV: 90.3 fL (ref 80.0–100.0)
NRBC: 0 % (ref 0.0–0.2)
PLATELETS: 268 10*3/uL (ref 150–400)
RBC: 4.23 MIL/uL (ref 3.87–5.11)
RDW: 11.9 % (ref 11.5–15.5)
WBC: 8.5 10*3/uL (ref 4.0–10.5)

## 2018-07-04 LAB — BASIC METABOLIC PANEL
Anion gap: 15 (ref 5–15)
BUN: 19 mg/dL (ref 6–20)
CALCIUM: 9.5 mg/dL (ref 8.9–10.3)
CHLORIDE: 91 mmol/L — AB (ref 98–111)
CO2: 23 mmol/L (ref 22–32)
CREATININE: 0.84 mg/dL (ref 0.44–1.00)
GFR calc non Af Amer: >60 mL/min (ref >60)
Glucose, Bld: 364 mg/dL — ABNORMAL HIGH (ref 70–99)
Potassium: 3.5 mmol/L (ref 3.5–5.1)
Sodium: 129 mmol/L — ABNORMAL LOW (ref 135–145)

## 2018-07-04 LAB — CBG MONITORING, ED: GLUCOSE-CAPILLARY: 375 mg/dL — AB (ref 70–99)

## 2018-07-04 LAB — I-STAT TROPONIN, ED: TROPONIN I, POC: 0 ng/mL (ref 0.00–0.08)

## 2018-07-04 LAB — I-STAT BETA HCG BLOOD, ED (MC, WL, AP ONLY)

## 2018-07-04 MED ORDER — OSELTAMIVIR PHOSPHATE 75 MG PO CAPS
75.0000 mg | ORAL_CAPSULE | Freq: Once | ORAL | Status: AC
  Administered 2018-07-04: 75 mg via ORAL
  Filled 2018-07-04: qty 1

## 2018-07-04 MED ORDER — ACETAMINOPHEN 500 MG PO TABS
1000.0000 mg | ORAL_TABLET | Freq: Once | ORAL | Status: AC
  Administered 2018-07-04: 1000 mg via ORAL
  Filled 2018-07-04: qty 2

## 2018-07-04 MED ORDER — OSELTAMIVIR PHOSPHATE 75 MG PO CAPS: 75.0000 mg | ORAL_CAPSULE | Freq: Two times a day (BID) | ORAL | 0 refills | Status: DC

## 2018-07-04 MED ORDER — SODIUM CHLORIDE 0.9 % IV BOLUS
1000.0000 mL | Freq: Once | INTRAVENOUS | Status: AC
  Administered 2018-07-04: 1000 mL via INTRAVENOUS

## 2018-07-04 MED ORDER — ONDANSETRON HCL 4 MG/2ML IJ SOLN
4.0000 mg | Freq: Once | INTRAMUSCULAR | Status: AC
  Administered 2018-07-04: 4 mg via INTRAVENOUS
  Filled 2018-07-04: qty 2

## 2018-07-04 MED ORDER — GLIPIZIDE ER 5 MG PO TB24: 5.0000 mg | ORAL_TABLET | Freq: Every day | ORAL | 1 refills | Status: DC

## 2018-07-04 NOTE — ED Notes (Signed)
CBG is 375.

## 2018-07-04 NOTE — ED Notes (Signed)
Pt aware of UA need 

## 2018-07-04 NOTE — Discharge Instructions (Addendum)
You were evaluated in the Emergency Department and after careful evaluation, we did not find any emergent condition requiring admission or further testing in the hospital.  Your symptoms today seem to be due to the flu.  Please take the Tamiflu medication as directed.  We have also sent a refill of your glipizide to your pharmacy, please restart this medication.  Continue to drink plenty of fluids at home.  Please return to the Emergency Department if you experience any worsening of your condition.  We encourage you to follow up with a primary care provider.  Thank you for allowing [place] to be a part of your care.

## 2018-07-04 NOTE — ED Notes (Signed)
Patient verbalizes understanding of discharge instructions. Opportunity for questioning and answers were provided. 

## 2018-07-04 NOTE — ED Provider Notes (Signed)
[facility] Emergency Department Provider Note MRN:  [id number]  Arrival date & time: 07/04/18     Chief Complaint   Hyperglycemia and Tachycardia   History of Present Illness   Gwendolyn Carrillo is a 43 y.o. year-old female with a history of diabetes, hypertension presenting to the ED with chief complaint of hyperglycemia.  Patient explains that she lost weight, about 42 pounds last year, which helped her get off insulin and helped her to only have to take her glipizide every other day.  Her prescription ran out a month ago and she decided to try to go without.  No diabetes medications for the past month.  Patient endorsing cold-like symptoms last week that seemed to go away, but now seem to have returned today.  Body aches, cough, general malaise, elevated heart rate, drinking a lot of water, frequent urination, denies fever, mild dull frontal headache, no neck pain, dyspnea on exertion, no chest pain, no abdominal pain.  Symptoms are constant, moderate, no exacerbating relieving factors.  Review of Systems  A complete 10 system review of systems was obtained and all systems are negative except as noted in the HPI and PMH.   Patient's Health History    Past Medical History:  Diagnosis Date  . Anxiety   . Diabetes mellitus without complication (HCC)   . Hypertension     Past Surgical History:  Procedure Laterality Date  . CESAREAN SECTION    . ENDOMETRIAL ABLATION      No family history on file.  Social History   Socioeconomic History  . Marital status: Married    Spouse name: Not on file  . Number of children: Not on file  . Years of education: Not on file  . Highest education level: Not on file  Occupational History  . Not on file  Social Needs  . Financial resource strain: Not on file  . Food insecurity:    Worry: Not on file    Inability: Not on file  . Transportation needs:    Medical: Not on file    Non-medical: Not on file  Tobacco Use  .  Smoking status: Current Every Day Smoker    Types: Cigars  . Smokeless tobacco: Never Used  Substance and Sexual Activity  . Alcohol use: Yes    Comment: occ  . Drug use: No  . Sexual activity: Not Currently  Lifestyle  . Physical activity:    Days per week: Not on file    Minutes per session: Not on file  . Stress: Not on file  Relationships  . Social connections:    Talks on phone: Not on file    Gets together: Not on file    Attends religious service: Not on file    Active member of club or organization: Not on file    Attends meetings of clubs or organizations: Not on file    Relationship status: Not on file  . Intimate partner violence:    Fear of current or ex partner: Not on file    Emotionally abused: Not on file    Physically abused: Not on file    Forced sexual activity: Not on file  Other Topics Concern  . Not on file  Social History Narrative  . Not on file     Physical Exam  Vital Signs and Nursing Notes reviewed Vitals:   07/04/18 1200 07/04/18 1215  BP: 127/80 129/77  Pulse: (!) 113 (!) 114  Resp: 19 19  Temp:    SpO2: 97% 97%    CONSTITUTIONAL: Well-appearing, NAD NEURO:  Alert and oriented x 3, no focal deficits EYES:  eyes equal and reactive ENT/NECK:  no LAD, no JVD CARDIO: Tachycardic rate, well-perfused, normal S1 and S2 PULM:  CTAB no wheezing or rhonchi GI/GU:  normal bowel sounds, non-distended, non-tender MSK/SPINE:  No gross deformities, no edema SKIN:  no rash, atraumatic PSYCH:  Appropriate speech and behavior  Diagnostic and Interventional Summary    EKG Interpretation  Date/Time:  Tuesday July 04 2018 08:59:35 EDT Ventricular Rate:  115 PR Interval:    QRS Duration: 87 QT Interval:  329 QTC Calculation: 455 R Axis:   86 Text Interpretation:  Sinus tachycardia Consider right atrial enlargement Confirmed by Kennis Carina [phone]) on 07/04/2018 9:21:22 AM      Labs Reviewed  BASIC METABOLIC PANEL - Abnormal; Notable for  the following components:      Result Value   Sodium 129 (*)    Chloride 91 (*)    Glucose, Bld 364 (*)    All other components within normal limits  URINALYSIS, ROUTINE W REFLEX MICROSCOPIC - Abnormal; Notable for the following components:   Glucose, UA >=500 (*)    Hgb urine dipstick SMALL (*)    Ketones, ur 20 (*)    All other components within normal limits  INFLUENZA PANEL BY PCR (TYPE A & B) - Abnormal; Notable for the following components:   Influenza A By PCR POSITIVE (*)    All other components within normal limits  CBG MONITORING, ED - Abnormal; Notable for the following components:   Glucose-Capillary 375 (*)    All other components within normal limits  CBC  I-STAT BETA HCG BLOOD, ED ([facility], [facility], [facility] ONLY)  I-STAT TROPONIN, ED    DG Chest 2 View  Final Result      Medications  sodium chloride 0.9 % bolus 1,000 mL (0 mLs Intravenous Stopped 07/04/18 1054)  ondansetron (ZOFRAN) injection 4 mg (4 mg Intravenous Given 07/04/18 0927)  sodium chloride 0.9 % bolus 1,000 mL (1,000 mLs Intravenous New Bag/Given 07/04/18 1145)  oseltamivir (TAMIFLU) capsule 75 mg (75 mg Oral Given 07/04/18 1145)  acetaminophen (TYLENOL) tablet 1,000 mg (1,000 mg Oral Given 07/04/18 1145)     Procedures Critical Care  ED Course and Medical Decision Making  I have reviewed the triage vital signs and the nursing notes.  Pertinent labs & imaging results that were available during my care of the patient were reviewed by me and considered in my medical decision making (see below for details).  Considering DKA in this 43 year old female who has had no diabetes medications for the past month, also exhibiting infectious symptoms, considering pneumonia versus viral process.  Work-up pending.  No evidence of DKA, hyperglycemia is mild, provided with 2 L IV fluids.  Influenza A positive.  No meningismus.  Mild fever with mild tachycardia here in the ED but well-appearing.  Heart rate improving with fluids.   Appropriate for outpatient management with Tamiflu, continued hydration at home, advised to restart her glipizide.  After the discussed management above, the patient was determined to be safe for discharge.  The patient was in agreement with this plan and all questions regarding their care were answered.  ED return precautions were discussed and the patient will return to the ED with any significant worsening of condition.  Elmer Sow. Pilar Plate, MD [facility] Emergency Medicine [facility] [email or web address]  Final Clinical Impressions(s) / ED  Diagnoses     ICD-10-CM   1. Influenza A J10.1   2. SOB (shortness of breath) R06.02 DG Chest 2 View    DG Chest 2 View    ED Discharge Orders         Ordered    oseltamivir (TAMIFLU) 75 MG capsule  Every 12 hours     07/04/18 1220    glipiZIDE (GLUCOTROL XL) 5 MG 24 hr tablet  Daily with breakfast     07/04/18 1220             Sabas Sous, MD 07/04/18 1224

## 2018-07-04 NOTE — ED Triage Notes (Signed)
Pt reports high blood sugars in the 400's and a rapid heart beat. Pt reports this started yesterday.

## 2019-06-14 ENCOUNTER — Telehealth: Payer: Self-pay | Admitting: Oncology

## 2019-06-14 NOTE — Telephone Encounter (Signed)
Received a new hem referral from Dr. Holley Bouche for elevated white blood cell count. Gwendolyn Carrillo has been cld and scheduled to see Dr. Clelia Croft on 3/4 at 2pm. Pt aware to arrive 15 minutes early.

## 2019-06-28 ENCOUNTER — Other Ambulatory Visit: Payer: Self-pay

## 2019-06-28 ENCOUNTER — Inpatient Hospital Stay: Payer: 59 | Attending: Oncology | Admitting: Oncology

## 2019-06-28 VITALS — BP 122/81 | HR 84 | Temp 97.6°F | Resp 18 | Ht 64.0 in | Wt 149.9 lb

## 2019-06-28 DIAGNOSIS — D72819 Decreased white blood cell count, unspecified: Secondary | ICD-10-CM | POA: Insufficient documentation

## 2019-06-28 DIAGNOSIS — E119 Type 2 diabetes mellitus without complications: Secondary | ICD-10-CM | POA: Insufficient documentation

## 2019-06-28 DIAGNOSIS — D72829 Elevated white blood cell count, unspecified: Secondary | ICD-10-CM

## 2019-06-28 DIAGNOSIS — I10 Essential (primary) hypertension: Secondary | ICD-10-CM | POA: Insufficient documentation

## 2019-06-28 DIAGNOSIS — Z7984 Long term (current) use of oral hypoglycemic drugs: Secondary | ICD-10-CM | POA: Insufficient documentation

## 2019-06-28 DIAGNOSIS — F419 Anxiety disorder, unspecified: Secondary | ICD-10-CM | POA: Insufficient documentation

## 2019-06-28 DIAGNOSIS — Z79899 Other long term (current) drug therapy: Secondary | ICD-10-CM | POA: Diagnosis not present

## 2019-06-28 DIAGNOSIS — F17210 Nicotine dependence, cigarettes, uncomplicated: Secondary | ICD-10-CM | POA: Insufficient documentation

## 2019-06-28 NOTE — Progress Notes (Signed)
Reason for the request: Leukocytopenia.  HPI: I was asked by Dr. Earney Navy to evaluate Gwendolyn Carrillo for leukocytopenia.  She is a 44 year old woman with history of hypertension, diabetes and anxiety who was found to have leukocytosis based on laboratory testing in January 2021.  At that time her white cell count was 13.6 with nearly hemoglobin and normal platelet count.  Her differential was normal at that time.  Previous says CBC dating back to 2009 showed fluctuating white cell count as high as 13.9 back in 2013.  Clinically, she reports no complaints at this time.  She does smoke 5 cigarettes a day and has done so for last 7 years.  She denies any lymphadenopathy, weight loss or appetite changes.  Continues to work full-time without any decline in ability to do so.  She does not report any headaches, blurry vision, syncope or seizures. Does not report any fevers, chills or sweats.  Does not report any cough, wheezing or hemoptysis.  Does not report any chest pain, palpitation, orthopnea or leg edema.  Does not report any nausea, vomiting or abdominal pain.  Does not report any constipation or diarrhea.  Does not report any skeletal complaints.    Does not report frequency, urgency or hematuria.  Does not report any skin rashes or lesions. Does not report any heat or cold intolerance.  Does not report any lymphadenopathy or petechiae.  Does not report any anxiety or depression.  Remaining review of systems is negative.    Past Medical History:  Diagnosis Date  . Anxiety   . Diabetes mellitus without complication (HCC)   . Hypertension   :  Past Surgical History:  Procedure Laterality Date  . CESAREAN SECTION    . ENDOMETRIAL ABLATION    :   Current Outpatient Medications:  .  acetaminophen (TYLENOL) 500 MG tablet, Take 1,000 mg by mouth every 6 (six) hours as needed. For pain., Disp: , Rfl:  .  glipiZIDE (GLUCOTROL XL) 5 MG 24 hr tablet, Take 1 tablet (5 mg total) by mouth daily with  breakfast., Disp: 30 tablet, Rfl: 1 .  hydrochlorothiazide (HYDRODIURIL) 50 MG tablet, Take 50 mg by mouth daily., Disp: , Rfl:  .  ibuprofen (ADVIL,MOTRIN) 200 MG tablet, Take 400 mg by mouth as needed for moderate pain., Disp: , Rfl:  .  lisinopril (PRINIVIL,ZESTRIL) 20 MG tablet, Take 20 mg by mouth daily., Disp: , Rfl:  .  metoprolol succinate (TOPROL-XL) 50 MG 24 hr tablet, Take 1 tablet (50 mg total) by mouth daily. Take with or immediately following a meal. (Patient not taking: Reported on 07/04/2018), Disp: 90 tablet, Rfl: 1 .  Omega-3 Fatty Acids (FISH OIL) 1000 MG CAPS, Take 2,000 mg by mouth daily., Disp: , Rfl:  .  omeprazole (PRILOSEC) 40 MG capsule, Take 40 mg by mouth daily., Disp: , Rfl:  .  oseltamivir (TAMIFLU) 75 MG capsule, Take 1 capsule (75 mg total) by mouth every 12 (twelve) hours., Disp: 10 capsule, Rfl: 0 .  vitamin C (ASCORBIC ACID) 500 MG tablet, Take 1,000 mg by mouth daily., Disp: , Rfl: :  No Known Allergies:  No family history on file.:  Social History   Socioeconomic History  . Marital status: Married    Spouse name: Not on file  . Number of children: Not on file  . Years of education: Not on file  . Highest education level: Not on file  Occupational History  . Not on file  Tobacco Use  . Smoking  status: Current Every Day Smoker    Types: Cigars  . Smokeless tobacco: Never Used  Substance and Sexual Activity  . Alcohol use: Yes    Comment: occ  . Drug use: No  . Sexual activity: Not Currently  Other Topics Concern  . Not on file  Social History Narrative  . Not on file   Social Determinants of Health   Financial Resource Strain:   . Difficulty of Paying Living Expenses: Not on file  Food Insecurity:   . Worried About Charity fundraiser in the Last Year: Not on file  . Ran Out of Food in the Last Year: Not on file  Transportation Needs:   . Lack of Transportation (Medical): Not on file  . Lack of Transportation (Non-Medical): Not on file   Physical Activity:   . Days of Exercise per Week: Not on file  . Minutes of Exercise per Session: Not on file  Stress:   . Feeling of Stress : Not on file  Social Connections:   . Frequency of Communication with Friends and Family: Not on file  . Frequency of Social Gatherings with Friends and Family: Not on file  . Attends Religious Services: Not on file  . Active Member of Clubs or Organizations: Not on file  . Attends Archivist Meetings: Not on file  . Marital Status: Not on file  Intimate Partner Violence:   . Fear of Current or Ex-Partner: Not on file  . Emotionally Abused: Not on file  . Physically Abused: Not on file  . Sexually Abused: Not on file  :  Pertinent items are noted in HPI.  Exam: Blood pressure 122/81, pulse 84, temperature 97.6 F (36.4 C), resp. rate 18, height 5\' 4"  (1.626 m), weight 149 lb 14.4 oz (68 kg), SpO2 100 %.   ECOG 0 General appearance: alert and cooperative appeared without distress. Head: atraumatic without any abnormalities. Eyes: conjunctivae/corneas clear. PERRL.  Sclera anicteric. Throat: lips, mucosa, and tongue normal; without oral thrush or ulcers. Resp: clear to auscultation bilaterally without rhonchi, wheezes or dullness to percussion. Cardio: regular rate and rhythm, S1, S2 normal, no murmur, click, rub or gallop GI: soft, non-tender; bowel sounds normal; no masses,  no organomegaly Skin: Skin color, texture, turgor normal. No rashes or lesions Lymph nodes: Cervical, supraclavicular, and axillary nodes normal. Neurologic: Grossly normal without any motor, sensory or deep tendon reflexes. Musculoskeletal: No joint deformity or effusion.  CBC    Component Value Date/Time   WBC 8.5 07/04/2018 0818   RBC 4.23 07/04/2018 0818   HGB 13.5 07/04/2018 0818   HCT 38.2 07/04/2018 0818   PLT 268 07/04/2018 0818   MCV 90.3 07/04/2018 0818   MCH 31.9 07/04/2018 0818   MCHC 35.3 07/04/2018 0818   RDW 11.9 07/04/2018 0818    LYMPHSABS 3.9 10/01/2011 1213   MONOABS 1.0 10/01/2011 1213   EOSABS 0.1 10/01/2011 1213   BASOSABS 0.0 10/01/2011 1213   Results for LEILYNN, PILAT (MRN [id number]) as of 06/28/2019 14:03  Ref. Range 10/12/2007 13:12 10/01/2011 12:13 07/04/2018 08:18  WBC Latest Ref Range: 4.0 - 10.5 K/uL 8.0 13.4 (H) 8.5    Assessment and Plan:   44 year old woman with the following:  1.  Leukocytosis dating back at least 06/16/2011 with a white cell count at time was 13.4.  CBC obtained in January 2021 showed similar laboratory findings with white cell count of 13.6 and normal differential.  She had a fluctuating white cell count  in between including normal white cell count of 2020.  The differential diagnosis was reviewed at this time.  Reactive leukocytosis for benign causes is the most likely etiology.  Smoking is certainly contributing to that as well as stress.  Lymphoproliferative disorder or myeloproliferative disorder are considered less likely.  Chronic myelogenous leukemia, marrow fibrosis among other causes are considered less likely.  From a management standpoint, I have recommended period of observation and surveillance and repeat testing in 6 months.  I recommended smoking cessation as well as stress reduction and exercise.    If her CBC shows close to normal range for similar white cell count and I do not recommend any further testing.  If she has persistent elevation or higher increase in her white cell count then a work-up for myeloproliferative disorder may be needed.  2.  Follow-up: will be in 6 months for repeat testing.  45  minutes were dedicated to this visit. The time was spent on reviewing laboratory data, discussing differential diagnosis and answering questions regarding future plan.       A copy of this consult has been forwarded to the requesting physician.

## 2019-06-29 ENCOUNTER — Telehealth: Payer: Self-pay | Admitting: Oncology

## 2019-06-29 NOTE — Telephone Encounter (Signed)
Scheduled appt per 3/4 los.  Spoke with pt and she is aware of her scheduled appt dates and time,

## 2019-08-27 IMAGING — CR CHEST - 2 VIEW
2 series · 2 of 2 positions shown · non-contrast
Comparison: None.

CLINICAL DATA: Shortness of breath.  Chest pain.  Productive cough.

EXAM:
CHEST - 2 VIEW

[chest lat]
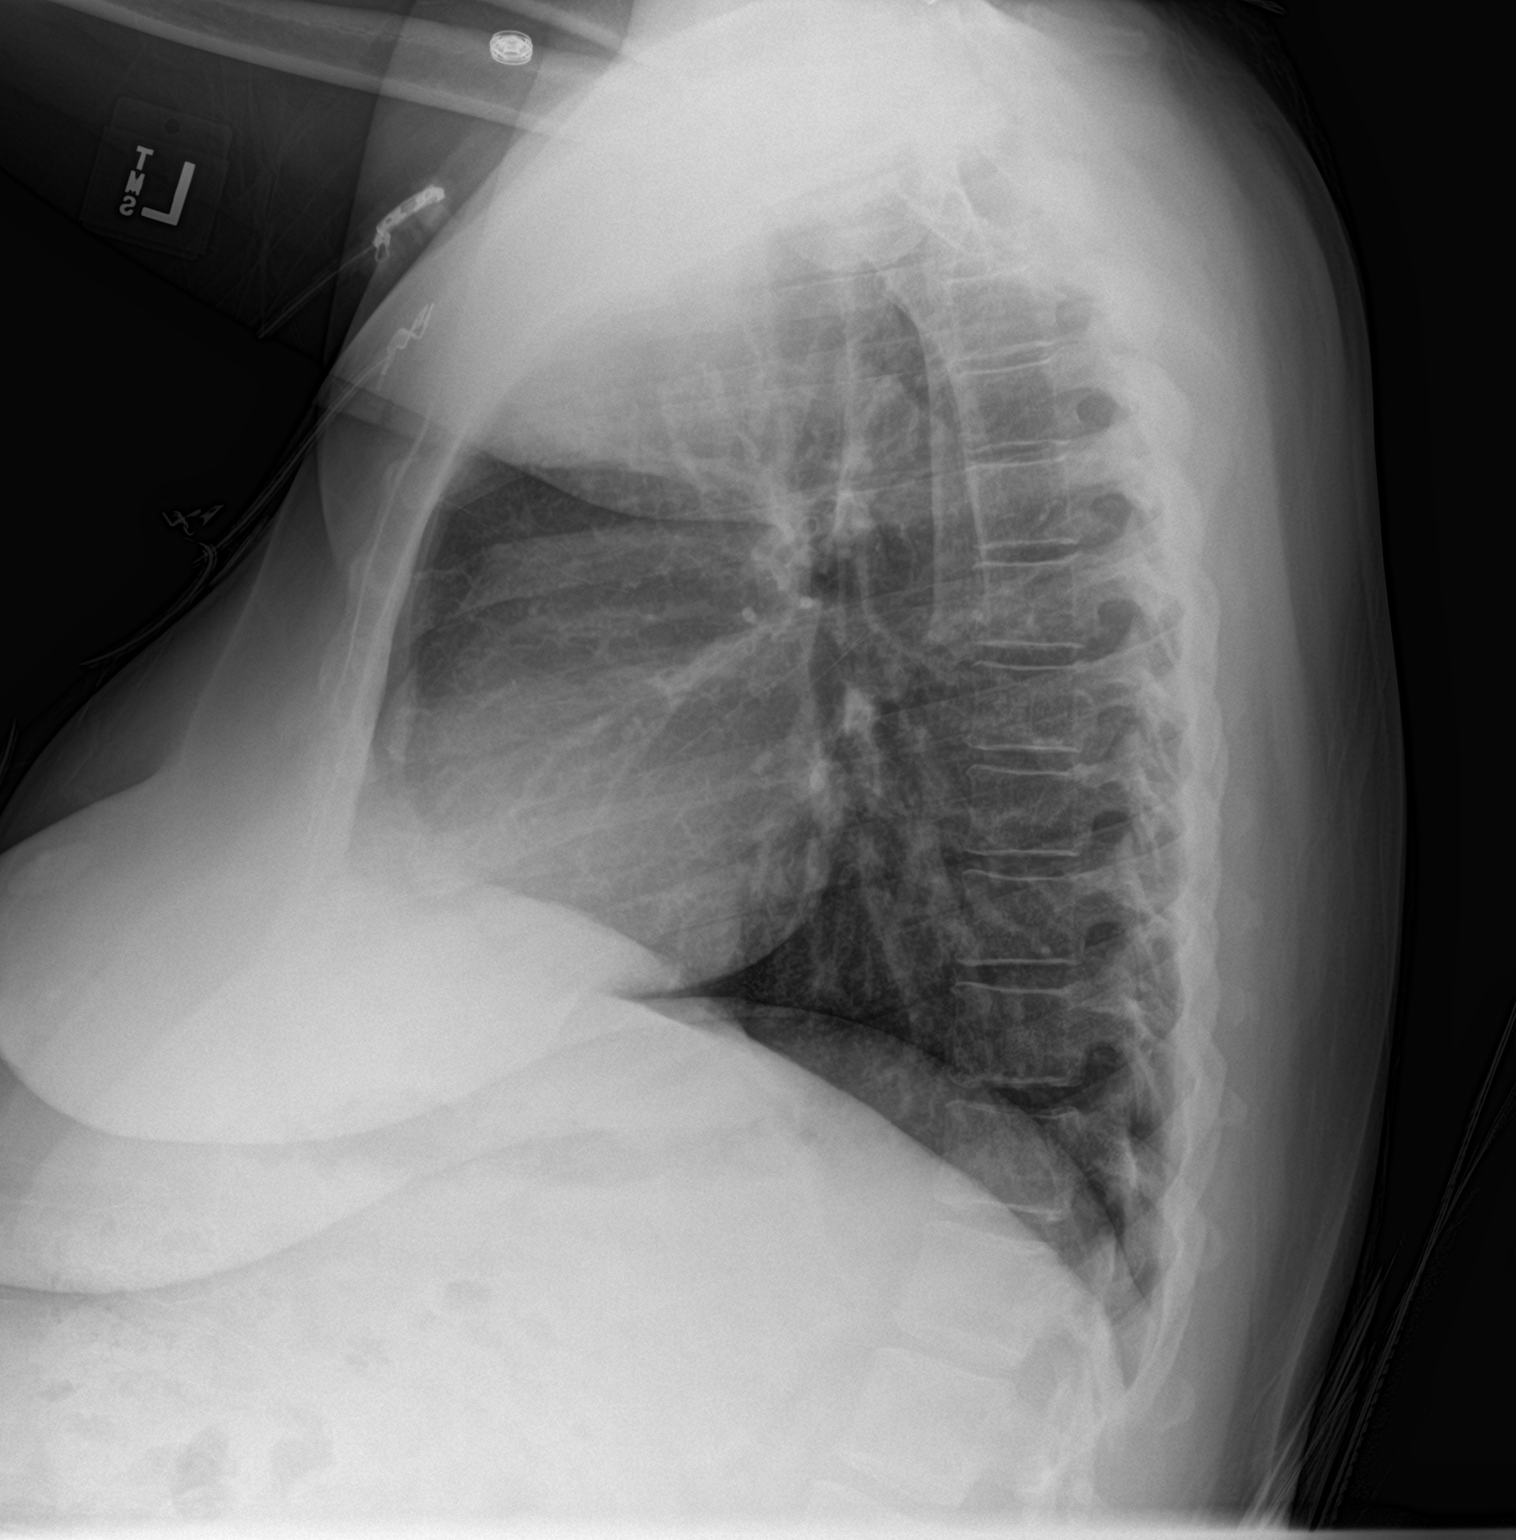

[chest ap]
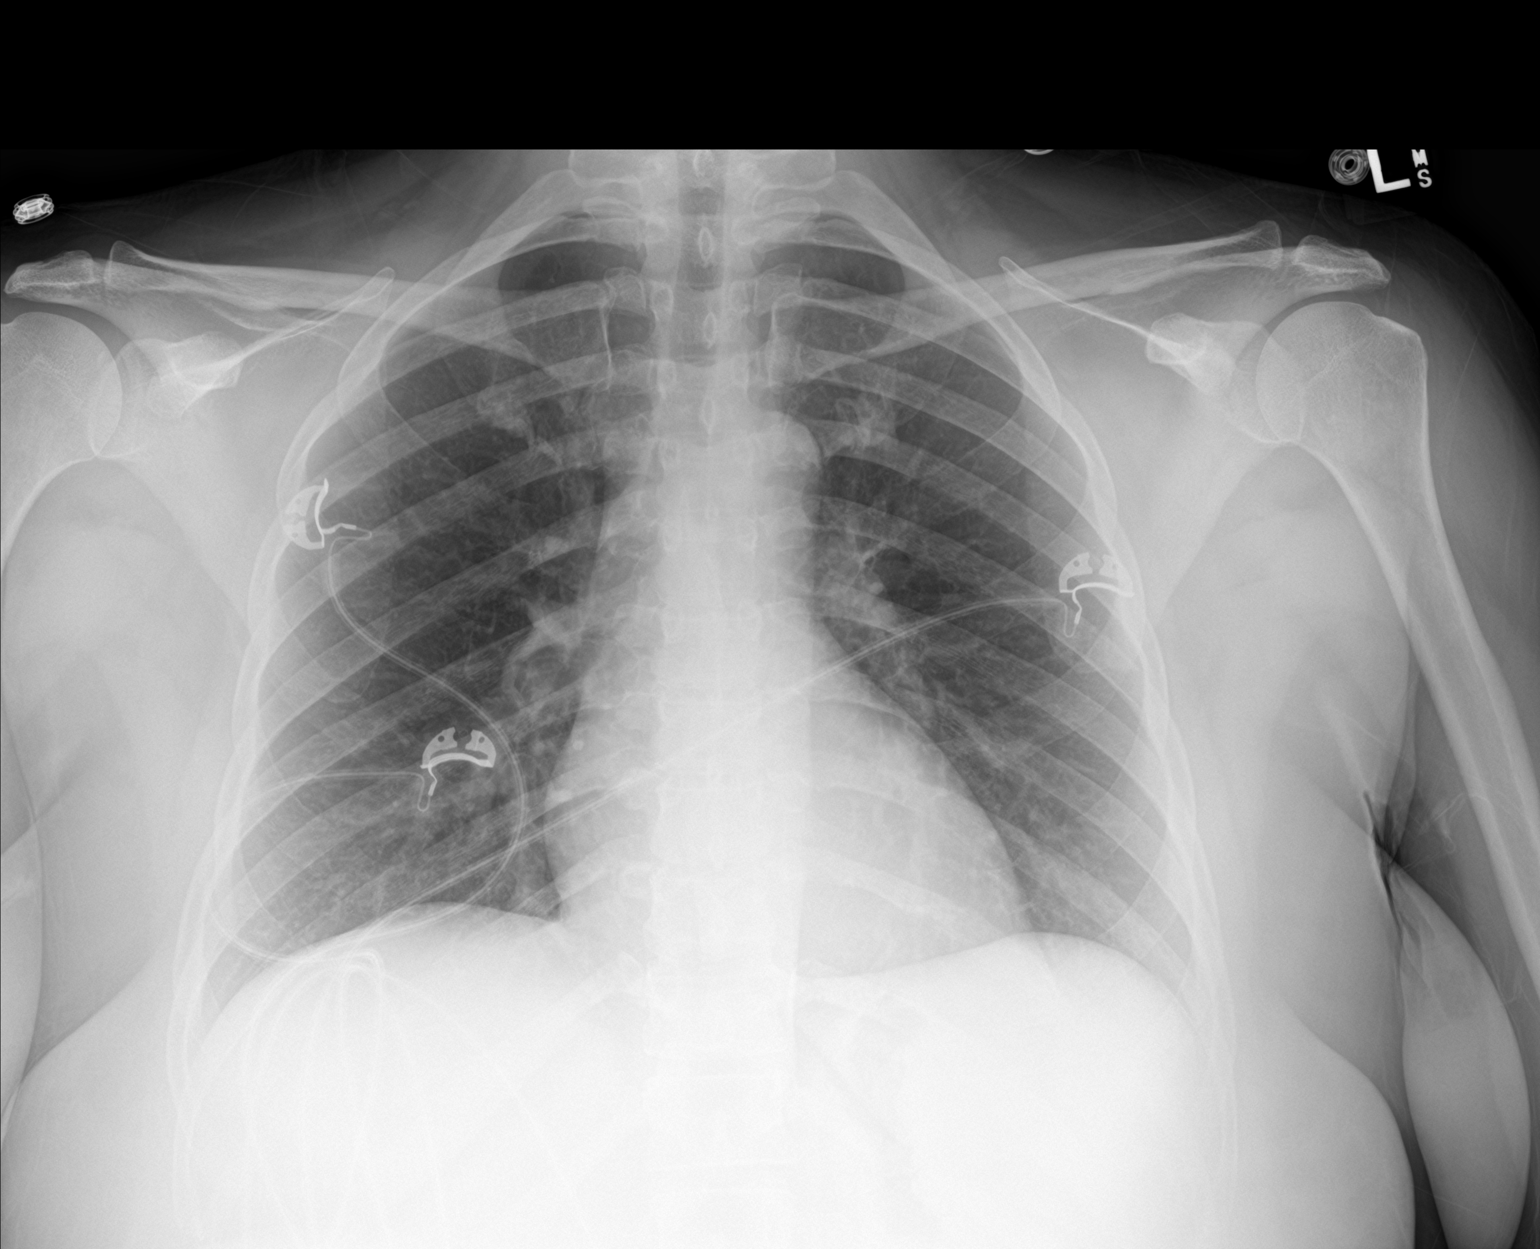

[2 of 2 positions shown; findings below may reference images not displayed]

FINDINGS: The heart size and mediastinal contours are within normal limits.
Both lungs are clear except for slight peribronchial thickening. The
visualized skeletal structures are unremarkable.
IMPRESSION: Slight bronchitic changes.  Otherwise, normal exam.

## 2019-10-10 ENCOUNTER — Emergency Department (HOSPITAL_COMMUNITY)
Admission: EM | Admit: 2019-10-10 | Discharge: 2019-10-10 | Disposition: A | Discharge status: Home / Self Care | Payer: No Typology Code available for payment source | Attending: Emergency Medicine | Admitting: Emergency Medicine

## 2019-10-10 ENCOUNTER — Encounter (HOSPITAL_COMMUNITY): Payer: Self-pay | Admitting: *Deleted

## 2019-10-10 ENCOUNTER — Other Ambulatory Visit: Payer: Self-pay

## 2019-10-10 DIAGNOSIS — R110 Nausea: Secondary | ICD-10-CM | POA: Diagnosis not present

## 2019-10-10 DIAGNOSIS — Z79899 Other long term (current) drug therapy: Secondary | ICD-10-CM | POA: Diagnosis not present

## 2019-10-10 DIAGNOSIS — R1084 Generalized abdominal pain: Secondary | ICD-10-CM | POA: Diagnosis present

## 2019-10-10 DIAGNOSIS — E119 Type 2 diabetes mellitus without complications: Secondary | ICD-10-CM | POA: Insufficient documentation

## 2019-10-10 DIAGNOSIS — I10 Essential (primary) hypertension: Secondary | ICD-10-CM | POA: Diagnosis not present

## 2019-10-10 DIAGNOSIS — Z7984 Long term (current) use of oral hypoglycemic drugs: Secondary | ICD-10-CM | POA: Insufficient documentation

## 2019-10-10 LAB — CBC
HCT: 38.4 % (ref 36.0–46.0)
Hemoglobin: 13.5 g/dL (ref 12.0–15.0)
MCH: 33.3 pg (ref 26.0–34.0)
MCHC: 35.2 g/dL (ref 30.0–36.0)
MCV: 94.8 fL (ref 80.0–100.0)
Platelets: 289 10*3/uL (ref 150–400)
RBC: 4.05 MIL/uL (ref 3.87–5.11)
RDW: 12.4 % (ref 11.5–15.5)
WBC: 10.4 10*3/uL (ref 4.0–10.5)
nRBC: 0 % (ref 0.0–0.2)

## 2019-10-10 LAB — COMPREHENSIVE METABOLIC PANEL
ALT: 41 U/L (ref 0–44)
AST: 31 U/L (ref 15–41)
Albumin: 4 g/dL (ref 3.5–5.0)
Alkaline Phosphatase: 60 U/L (ref 38–126)
Anion gap: 11 (ref 5–15)
BUN: 12 mg/dL (ref 6–20)
CO2: 23 mmol/L (ref 22–32)
Calcium: 9.5 mg/dL (ref 8.9–10.3)
Chloride: 99 mmol/L (ref 98–111)
Creatinine, Ser: 0.82 mg/dL (ref 0.44–1.00)
GFR calc Af Amer: >60 mL/min (ref >60)
GFR calc non Af Amer: >60 mL/min (ref >60)
Glucose, Bld: 91 mg/dL (ref 70–99)
Potassium: 3.2 mmol/L — ABNORMAL LOW (ref 3.5–5.1)
Sodium: 133 mmol/L — ABNORMAL LOW (ref 135–145)
Total Bilirubin: 1 mg/dL (ref 0.3–1.2)
Total Protein: 8.3 g/dL — ABNORMAL HIGH (ref 6.5–8.1)

## 2019-10-10 LAB — LIPASE, BLOOD: Lipase: 44 U/L (ref 11–51)

## 2019-10-10 LAB — I-STAT BETA HCG BLOOD, ED (MC, WL, AP ONLY): I-stat hCG, quantitative: <5 m[IU]/mL (ref <5)

## 2019-10-10 LAB — CBG MONITORING, ED: Glucose-Capillary: 91 mg/dL (ref 70–99)

## 2019-10-10 MED ORDER — ONDANSETRON HCL 4 MG PO TABS: 4.0000 mg | ORAL_TABLET | Freq: Three times a day (TID) | ORAL | 0 refills | Status: DC | PRN

## 2019-10-10 MED ORDER — SODIUM CHLORIDE 0.9% FLUSH: 3.0000 mL | Freq: Once | INTRAVENOUS | Status: DC

## 2019-10-10 MED ORDER — ONDANSETRON HCL 4 MG PO TABS
4.0000 mg | ORAL_TABLET | Freq: Once | ORAL | Status: AC
  Administered 2019-10-10: 4 mg via ORAL
  Filled 2019-10-10: qty 1

## 2019-10-10 NOTE — ED Notes (Signed)
Discharge instructions reviewed with pt. Pt verbalized understanding.   

## 2019-10-10 NOTE — ED Triage Notes (Signed)
PT states 2016 had appy and since has been having stomach issue.  Diagnosed a year later for DM.  Pt states needed to come in for mid abdominal pain.

## 2019-10-10 NOTE — ED Provider Notes (Signed)
[facility] EMERGENCY DEPARTMENT Provider Note   CSN: [id number] Arrival date & time: 10/10/19  1359     History Chief Complaint  Patient presents with  . Abdominal Pain    Gwendolyn Carrillo is a 44 y.o. female.  Patient with history of diabetes, hypertension who presents to the ED with abdominal cramping.  Symptoms on and off for several months to years.  Does have GI follow-up coming up next month with an EGD and colonoscopy.  States had slightly worse cramping this morning.  Some emesis.  Has now resolved.  Denies any constipation or diarrhea but does have those at times.  Has not noticed any blood in her stool.  Denies any urinary symptoms.  The history is provided by the patient.  Abdominal Cramping This is a chronic problem. The current episode started more than 1 week ago. The problem occurs every several days. Associated symptoms include abdominal pain. Pertinent negatives include no chest pain, no headaches and no shortness of breath. Nothing aggravates the symptoms. Nothing relieves the symptoms. She has tried nothing for the symptoms. The treatment provided no relief.       Past Medical History:  Diagnosis Date  . Anxiety   . Diabetes mellitus without complication (HCC)   . Hypertension     There are no problems to display for this patient.   Past Surgical History:  Procedure Laterality Date  . APPENDECTOMY    . CESAREAN SECTION    . ENDOMETRIAL ABLATION       OB History   No obstetric history on file.     No family history on file.  Social History   Tobacco Use  . Smoking status: Current Every Day Smoker    Types: Cigars  . Smokeless tobacco: Never Used  Vaping Use  . Vaping Use: Never used  Substance Use Topics  . Alcohol use: Yes    Comment: occ  . Drug use: No    Home Medications Prior to Admission medications   Medication Sig Start Date End Date Taking? Authorizing Provider  acetaminophen (TYLENOL) 500 MG tablet Take  1,000 mg by mouth every 6 (six) hours as needed. For pain.    [provider]  ALPRAZolam (XANAX XR) 0.5 MG 24 hr tablet Take 0.5 mg by mouth daily as needed. 04/12/19   [provider]  ALPRAZolam Prudy Feeler) 0.5 MG tablet Take 0.5 mg by mouth daily as needed. 06/10/19   [provider]  atorvastatin (LIPITOR) 40 MG tablet Take 40 mg by mouth daily. 05/08/19   [provider]  fluconazole (DIFLUCAN) 150 MG tablet Take 150 mg by mouth daily. 05/08/19   [provider]  glipiZIDE (GLUCOTROL XL) 5 MG 24 hr tablet Take 1 tablet (5 mg total) by mouth daily with breakfast. 07/04/18   Sabas Sous, MD  hydrochlorothiazide (HYDRODIURIL) 50 MG tablet Take 50 mg by mouth daily.    [provider]  ibuprofen (ADVIL,MOTRIN) 200 MG tablet Take 400 mg by mouth as needed for moderate pain.    [provider]  lisinopril (PRINIVIL,ZESTRIL) 20 MG tablet Take 20 mg by mouth daily.    [provider]  metFORMIN (GLUCOPHAGE-XR) 500 MG 24 hr tablet Take 500 mg by mouth daily. 01/22/19   [provider]  metoprolol succinate (TOPROL-XL) 50 MG 24 hr tablet Take 1 tablet (50 mg total) by mouth daily. Take with or immediately following a meal. Patient not taking: Reported on 07/04/2018 12/04/12  Susy Frizzle, MD  Omega-3 Fatty Acids (FISH OIL) 1000 MG CAPS Take 2,000 mg by mouth daily.    [provider]  omeprazole (PRILOSEC) 40 MG capsule Take 40 mg by mouth daily.    [provider]  ondansetron (ZOFRAN) 4 MG tablet Take 1 tablet (4 mg total) by mouth every 8 (eight) hours as needed for up to 20 doses for nausea or vomiting. 10/10/19   Adaiah Jaskot, DO  oseltamivir (TAMIFLU) 75 MG capsule Take 1 capsule (75 mg total) by mouth every 12 (twelve) hours. 07/04/18   Maudie Flakes, MD  OZEMPIC, 1 MG/DOSE, 2 MG/1.5ML SOPN SMARTSIG:1 Pre-Filled Pen Syringe SUB-Q Once a Week 06/02/19   [provider]  vitamin C (ASCORBIC  ACID) 500 MG tablet Take 1,000 mg by mouth daily.    [provider]  Vitamin D, Ergocalciferol, (DRISDOL) 1.25 MG (50000 UNIT) CAPS capsule Take 50,000 Units by mouth once a week. 06/12/19   [provider]    Allergies    Patient has no known allergies.  Review of Systems   Review of Systems  Constitutional: Negative for chills and fever.  HENT: Negative for ear pain and sore throat.   Eyes: Negative for pain and visual disturbance.  Respiratory: Negative for cough and shortness of breath.   Cardiovascular: Negative for chest pain and palpitations.  Gastrointestinal: Positive for abdominal pain and nausea. Negative for vomiting.  Genitourinary: Negative for dysuria and hematuria.  Musculoskeletal: Negative for arthralgias and back pain.  Skin: Negative for color change and rash.  Neurological: Negative for seizures, syncope and headaches.  All other systems reviewed and are negative.   Physical Exam Updated Vital Signs BP 112/84 (BP Location: Left Arm)   Pulse 86   Temp 97.7 F (36.5 C) (Oral)   Resp 18   SpO2 100%   Physical Exam Vitals and nursing note reviewed.  Constitutional:      General: She is not in acute distress.    Appearance: She is well-developed.  HENT:     Head: Normocephalic and atraumatic.     Mouth/Throat:     Mouth: Mucous membranes are moist.  Eyes:     Extraocular Movements: Extraocular movements intact.     Conjunctiva/sclera: Conjunctivae normal.  Cardiovascular:     Rate and Rhythm: Normal rate and regular rhythm.     Heart sounds: Normal heart sounds. No murmur heard.   Pulmonary:     Effort: Pulmonary effort is normal. No respiratory distress.     Breath sounds: Normal breath sounds.  Abdominal:     General: There is no distension.     Palpations: Abdomen is soft.     Tenderness: There is no abdominal tenderness. There is no right CVA tenderness, left CVA tenderness, guarding or rebound. Negative signs include Murphy's  sign and Rovsing's sign.  Musculoskeletal:     Cervical back: Neck supple.  Skin:    General: Skin is warm and dry.     Capillary Refill: Capillary refill takes less than 2 seconds.  Neurological:     General: No focal deficit present.     Mental Status: She is alert.     ED Results / Procedures / Treatments   Labs (all labs ordered are listed, but only abnormal results are displayed) Labs Reviewed  COMPREHENSIVE METABOLIC PANEL - Abnormal; Notable for the following components:      Result Value   Sodium 133 (*)    Potassium 3.2 (*)  Total Protein 8.3 (*)    All other components within normal limits  LIPASE, BLOOD  CBC  I-STAT BETA HCG BLOOD, ED ([facility], [facility], [facility] ONLY)  CBG MONITORING, ED    EKG EKG Interpretation  Date/Time:  Wednesday October 10 2019 14:15:44 EDT Ventricular Rate:  104 PR Interval:  154 QRS Duration: 82 QT Interval:  336 QTC Calculation: 441 R Axis:   89 Text Interpretation: Sinus tachycardia Right atrial enlargement Borderline ECG Confirmed by Virgina Norfolk [phone]) on 10/10/2019 6:47:32 PM   Radiology No results found.  Procedures Procedures (including critical care time)  Medications Ordered in ED Medications  sodium chloride flush (NS) 0.9 % injection 3 mL (has no administration in time range)  ondansetron (ZOFRAN) tablet 4 mg (has no administration in time range)    ED Course  I have reviewed the triage vital signs and the nursing notes.  Pertinent labs & imaging results that were available during my care of the patient were reviewed by me and considered in my medical decision making (see chart for details).    MDM Rules/Calculators/A&P                          CORIANNE BUCCELLATO is a 44 year old female with history of hypertension, diabetes who presents to the ED with abdominal cramping.  Normal vitals.  No fever.  Lab work done prior to my evaluation.  Lipase normal doubt pancreatitis.  Gallbladder and liver enzymes within normal limits.   Doubt hepatobiliary process such as cholecystitis.  No significant leukocytosis, anemia, electrolyte abnormality.  Pregnancy test is negative.  Patient with crampy abdominal pain on and off for several months to years.  Possibly IBS or IBD.  Does not have any blood in her stool or vomit.  Patient overall with unremarkable abdominal exam.  She could have some mild gastroparesis as well.  Will give Zofran.  Does have follow-up with GI in place for EGD and colonoscopy which will be useful to rule out IBD.  Given return precautions and discharged from ED in good condition.  This chart was dictated using voice recognition software.  Despite best efforts to proofread,  errors can occur which can change the documentation meaning.  Final Clinical Impression(s) / ED Diagnoses Final diagnoses:  Generalized abdominal pain    Rx / DC Orders ED Discharge Orders         Ordered    ondansetron (ZOFRAN) 4 MG tablet  Every 8 hours PRN     Discontinue  Reprint     10/10/19 1926           Virgina Norfolk, DO 10/10/19 1927

## 2019-12-28 ENCOUNTER — Inpatient Hospital Stay: Payer: No Typology Code available for payment source | Attending: Oncology | Admitting: Oncology

## 2019-12-28 ENCOUNTER — Inpatient Hospital Stay: Payer: No Typology Code available for payment source

## 2020-01-21 ENCOUNTER — Ambulatory Visit (INDEPENDENT_AMBULATORY_CARE_PROVIDER_SITE_OTHER): Payer: No Typology Code available for payment source | Admitting: Adult Health

## 2020-01-21 ENCOUNTER — Encounter: Payer: Self-pay | Admitting: Adult Health

## 2020-01-21 ENCOUNTER — Other Ambulatory Visit: Payer: Self-pay

## 2020-01-21 VITALS — BP 126/78 | HR 86 | Ht 64.0 in | Wt 149.0 lb

## 2020-01-21 DIAGNOSIS — F411 Generalized anxiety disorder: Secondary | ICD-10-CM

## 2020-01-21 DIAGNOSIS — F331 Major depressive disorder, recurrent, moderate: Secondary | ICD-10-CM

## 2020-01-21 NOTE — Progress Notes (Signed)
[name] MD/PA/NP Initial Note  01/21/2020 5:32 PM Gwendolyn Carrillo  MRN:  [id number]  Chief Complaint:   HPI:   Describes mood today as "not so good". Tearful at times - "I cry alone". Mood symptoms - reports depression, anxiety, and irritability. Has "felt overwhelmed". Having panic attacks. Seen in the ER thinking she was having heart attack. Has been trialed on multiple SSRI's and other medications without any success. Has taken Xanax 0.5mg  daily and feels like it is helpful. Has taken two tablets daily on "occasion".   Stating I'm having to face some things of my own". Raised with 3 brothers. Mother was a good mother, but not the kindest. Had her first child at 53. Had her 4th child at 55. Has always taken care of her responsibilities. Feels if she failed as a mother, it's because she gave her children too much. Stating "I'm ready to live and I don't know how". Has been taking Xanax 0.5mg  daily for anxiety. Need help learning how to "feel ok with saying no". Needs to learn to put herself first sometimes. Wants to get up and enjoy her day. Stating "sometimes I feel like packing up my things and disappearing".   Has had a very difficult time with her oldest son. Son diagnosed with Schizoaffective disorder in 2015. Has to have an injection monthly. Son had a football scholarship to [employer] and was not able to attend.  Her ex is still giving her a hard time. Needs a break - "a getaway". No desire to do things. No sex drive. Hates her job. Is stressed out. Feels like she lost herself and can't find her way back. Having to accept that she can be "broken". Has tried to heal on her own and can't. Decreased interest and motivation. Has a difficult time taking a shower. Taking medications as prescribed.   Energy levels low - "all the time". Active, does not have a regular exercise routine.  Enjoys some usual interests and activities. Single. Lives with boyfriend x 3 years. Has 4 children 25, 19, 23,  and 15. Family local. Spending time with family. Appetite adequate - doing better now. Recent GI issues. Weight gain - 10 pounds - 149 pounds. Sleeps well most nights. Averages 8 to 10 hours - "it never feels like enough". Focus and concentration difficulties - "that's a big issue". Completing tasks. Managing aspects of household. Works full time for [employer] x 2 years. Has been struggling in work setting to complete tasks. Stating "I'm not able to do my job". Stating "my performance is not where it should be". Has beenworking with Production designer, theatre/television/film.  Denies SI or HI.  Denies AH or VH.  Previous medication trials: Xanax 0.5mg  daily, Lexapro x 1 year, Trintellix, any antidepressant.   Visit Diagnosis:    ICD-10-CM   1. Generalized anxiety disorder  F41.1   2. Major depressive disorder, recurrent episode, moderate (HCC)  F33.1     Past Psychiatric History: Denies psychiatric hospitalization.  Past Medical History:  Past Medical History:  Diagnosis Date  . Anxiety   . Diabetes mellitus without complication (HCC)   . Hypertension     Past Surgical History:  Procedure Laterality Date  . APPENDECTOMY    . CESAREAN SECTION    . ENDOMETRIAL ABLATION      Family Psychiatric History: Denies any family history of mental illness.  Family History: No family history on file.  Social History:  Social History   Socioeconomic History  . Marital status: Married  Spouse name: Not on file  . Number of children: Not on file  . Years of education: Not on file  . Highest education level: Not on file  Occupational History  . Not on file  Tobacco Use  . Smoking status: Current Every Day Smoker    Types: Cigars  . Smokeless tobacco: Never Used  Vaping Use  . Vaping Use: Never used  Substance and Sexual Activity  . Alcohol use: Yes    Comment: occ  . Drug use: No  . Sexual activity: Not Currently  Other Topics Concern  . Not on file  Social History Narrative  . Not on file   Social  Determinants of Health   Financial Resource Strain:   . Difficulty of Paying Living Expenses: Not on file  Food Insecurity:   . Worried About Programme researcher, broadcasting/film/video in the Last Year: Not on file  . Ran Out of Food in the Last Year: Not on file  Transportation Needs:   . Lack of Transportation (Medical): Not on file  . Lack of Transportation (Non-Medical): Not on file  Physical Activity:   . Days of Exercise per Week: Not on file  . Minutes of Exercise per Session: Not on file  Stress:   . Feeling of Stress : Not on file  Social Connections:   . Frequency of Communication with Friends and Family: Not on file  . Frequency of Social Gatherings with Friends and Family: Not on file  . Attends Religious Services: Not on file  . Active Member of Clubs or Organizations: Not on file  . Attends Banker Meetings: Not on file  . Marital Status: Not on file    Allergies: No Known Allergies  Metabolic Disorder Labs: No results found for: HGBA1C, MPG No results found for: PROLACTIN No results found for: CHOL, TRIG, HDL, CHOLHDL, VLDL, LDLCALC No results found for: TSH  Therapeutic Level Labs: No results found for: LITHIUM No results found for: VALPROATE No components found for:  CBMZ  Current Medications: Current Outpatient Medications  Medication Sig Dispense Refill  . acetaminophen (TYLENOL) 500 MG tablet Take 1,000 mg by mouth every 6 (six) hours as needed. For pain.    Marland Kitchen ALPRAZolam (XANAX XR) 0.5 MG 24 hr tablet Take 0.5 mg by mouth daily as needed.    . ALPRAZolam (XANAX) 0.5 MG tablet Take 0.5 mg by mouth daily as needed.    Marland Kitchen atorvastatin (LIPITOR) 40 MG tablet Take 40 mg by mouth daily.    . fluconazole (DIFLUCAN) 150 MG tablet Take 150 mg by mouth daily.    Marland Kitchen glipiZIDE (GLUCOTROL XL) 5 MG 24 hr tablet Take 1 tablet (5 mg total) by mouth daily with breakfast. 30 tablet 1  . hydrochlorothiazide (HYDRODIURIL) 50 MG tablet Take 50 mg by mouth daily.    Marland Kitchen ibuprofen  (ADVIL,MOTRIN) 200 MG tablet Take 400 mg by mouth as needed for moderate pain.    Marland Kitchen lisinopril (PRINIVIL,ZESTRIL) 20 MG tablet Take 20 mg by mouth daily.    . metFORMIN (GLUCOPHAGE-XR) 500 MG 24 hr tablet Take 500 mg by mouth daily.    . metoprolol succinate (TOPROL-XL) 50 MG 24 hr tablet Take 1 tablet (50 mg total) by mouth daily. Take with or immediately following a meal. (Patient not taking: Reported on 07/04/2018) 90 tablet 1  . Omega-3 Fatty Acids (FISH OIL) 1000 MG CAPS Take 2,000 mg by mouth daily.    Marland Kitchen omeprazole (PRILOSEC) 40 MG capsule Take 40  mg by mouth daily.    . ondansetron (ZOFRAN) 4 MG tablet Take 1 tablet (4 mg total) by mouth every 8 (eight) hours as needed for up to 20 doses for nausea or vomiting. 20 tablet 0  . oseltamivir (TAMIFLU) 75 MG capsule Take 1 capsule (75 mg total) by mouth every 12 (twelve) hours. 10 capsule 0  . OZEMPIC, 1 MG/DOSE, 2 MG/1.5ML SOPN SMARTSIG:1 Pre-Filled Pen Syringe SUB-Q Once a Week    . vitamin C (ASCORBIC ACID) 500 MG tablet Take 1,000 mg by mouth daily.    . Vitamin D, Ergocalciferol, (DRISDOL) 1.25 MG (50000 UNIT) CAPS capsule Take 50,000 Units by mouth once a week.     No current facility-administered medications for this visit.    Medication Side Effects: none  Orders placed this visit:  No orders of the defined types were placed in this encounter.   Psychiatric Specialty Exam:  Review of Systems  Blood pressure 126/78, pulse 86, height 5\' 4"  (1.626 m), weight 149 lb (67.6 kg).Body mass index is 25.58 kg/m.  General Appearance: Casual  Eye Contact:  Good  Speech:  Clear and Coherent and Normal Rate  Volume:  Normal  Mood:  Anxious, Depressed and Irritable  Affect:  Appropriate and Congruent  Thought Process:  Coherent and Descriptions of Associations: Intact  Orientation:  Full (Time, Place, and Person)  Thought Content: Logical   Suicidal Thoughts:  No  Homicidal Thoughts:  No  Memory:  WNL  Judgement:  Fair  Insight:  Fair   Psychomotor Activity:  Normal  Concentration:  Concentration: Fair  Recall:  NA  Fund of Knowledge: Good  Language: Good  Assets:  Communication Skills Desire for Improvement Financial Resources/Insurance Housing Intimacy Leisure Time Physical Health Resilience Social Support Talents/Skills Transportation Vocational/Educational  ADL's:  Intact  Cognition: WNL  Prognosis:  Good   Screenings: MDQ  Receiving Psychotherapy: No   Treatment Plan/Recommendations:   Plan:  PDMP reviewed  1. Increase Xanax 0.5mg  daily to BID 2. Set up with therapist   Patient disabled at this time. She is to remain out of work 01/21/2020 to 02/18/2020. She will return in 4 weeks for re-evaluation.  Refer to [facility] for ADHD testing.  Plans to set up an appointment with a therapist.   RTC 4 weeks  Patient advised to contact office with any questions, adverse effects, or acute worsening in signs and symptoms.  Time spent with patient was 60 minutes.   Greater than 50% of face to face time with patient was spent on counseling and coordination of care. We discussed medications, therapy, ADHD - testing. Information ive to get tested.    02/20/2020, NP

## 2020-01-24 ENCOUNTER — Telehealth: Payer: Self-pay | Admitting: Adult Health

## 2020-01-24 NOTE — Telephone Encounter (Signed)
FMLA received today and given to [facility]

## 2020-01-25 NOTE — Telephone Encounter (Signed)
Paperwork completed for continuous leave, but need to confirm beginning date. Note states to remain out of work 01/21/20

## 2020-01-28 ENCOUNTER — Telehealth: Payer: Self-pay | Admitting: Adult Health

## 2020-01-28 DIAGNOSIS — Z0289 Encounter for other administrative examinations: Secondary | ICD-10-CM

## 2020-01-28 NOTE — Telephone Encounter (Signed)
Patient disabled at this time. She is to remain out of work 01/21/2020 to 02/18/2020. She will return in 4 weeks for re-evaluation.

## 2020-01-28 NOTE — Telephone Encounter (Signed)
Thank you :)

## 2020-01-28 NOTE — Telephone Encounter (Signed)
[place] forms completed, signed and faxed.

## 2020-01-31 ENCOUNTER — Telehealth: Payer: Self-pay | Admitting: Adult Health

## 2020-01-31 NOTE — Telephone Encounter (Signed)
Please review

## 2020-01-31 NOTE — Telephone Encounter (Signed)
Patient was last seen on 9/27 and was asked if she wanted an RX but she said no. She is feeling depressed now and would like a medicine called in. [facility] [address] Ginette Otto. Next appt is 10/21.

## 2020-01-31 NOTE — Telephone Encounter (Signed)
Have her move up apt if possible.

## 2020-01-31 NOTE — Telephone Encounter (Signed)
She will need to set up an appointment with previous history of medication failures to discuss. Was also advised to set up appt with [facility].

## 2020-02-01 ENCOUNTER — Telehealth: Payer: Self-pay | Admitting: Adult Health

## 2020-02-01 NOTE — Telephone Encounter (Signed)
Please have her move up apt if possible.

## 2020-02-01 NOTE — Telephone Encounter (Signed)
Error

## 2020-02-14 ENCOUNTER — Encounter: Payer: Self-pay | Admitting: Adult Health

## 2020-02-14 ENCOUNTER — Other Ambulatory Visit: Payer: Self-pay

## 2020-02-14 ENCOUNTER — Ambulatory Visit (INDEPENDENT_AMBULATORY_CARE_PROVIDER_SITE_OTHER): Payer: No Typology Code available for payment source | Admitting: Adult Health

## 2020-02-14 DIAGNOSIS — F410 Panic disorder [episodic paroxysmal anxiety] without agoraphobia: Secondary | ICD-10-CM | POA: Diagnosis not present

## 2020-02-14 DIAGNOSIS — F422 Mixed obsessional thoughts and acts: Secondary | ICD-10-CM

## 2020-02-14 DIAGNOSIS — F331 Major depressive disorder, recurrent, moderate: Secondary | ICD-10-CM

## 2020-02-14 DIAGNOSIS — F411 Generalized anxiety disorder: Secondary | ICD-10-CM

## 2020-02-14 MED ORDER — LORAZEPAM 1 MG PO TABS: 1.0000 mg | ORAL_TABLET | Freq: Two times a day (BID) | ORAL | 0 refills | Status: DC

## 2020-02-14 MED ORDER — FLUOXETINE HCL 10 MG PO CAPS: 10.0000 mg | ORAL_CAPSULE | Freq: Every day | ORAL | 2 refills | Status: DC

## 2020-02-14 NOTE — Progress Notes (Signed)
Gwendolyn Carrillo [id number] 1975-08-12 44 y.o.  Subjective:   Patient ID:  Gwendolyn Carrillo is a 44 y.o. (DOB [birth year]) female.  Chief Complaint: No chief complaint on file.   HPI Gwendolyn Carrillo presents to the office today for follow-up of MDD and GAD.   Describes mood today as "not the best". Crying throughout the day. Mood symptoms - reports depression, anxiety, and irritability. Anxiety is worse. Having panic attacks. Stating "I'm panicking over everything". Increased worry and rumination. Thinking the worst is going to happen. Stating "I have periods of feeling so sad". Gets overwhelmed easily. Wanting things in order - "clean freak". Difficulties completing ADL's. Mostly staying home.Stating "right now I feel like a failure". Decreased interest and motivation. Taking medications as prescribed - Xanax making her tired. Does not feel like she is capable of returning to work.  Energy levels low. Trying some different vitamins. Active, does not have a regular exercise routine.  Not able to enjoy usual interests and activities. Single. Lives with boyfriend x 3 years. Has 4 children 25, 19, 23, and 15. Family local. Spending time with family. Appetite adequate - doing better now. GI issues have improved. Weight loss 10 pounds - 138 pounds.  Sleeps better some nights than others. Averages 8 to 10 hours of broken sleep. Focus and concentration difficulties. Awaiting appointment with [facility]. Completing tasks. Managing some aspects of household. Works full time for [employer]. Out on disability currently. Denies SI or HI.  Denies AH or VH.  Previous medication trials: Xanax 0.5mg  daily, Lexapro x 1 year - felt tired all the time, Trintellix, any antidepressant.     Review of Systems:  Review of Systems  Musculoskeletal: Negative for gait problem.  Neurological: Negative for tremors.  Psychiatric/Behavioral:       Please refer to HPI    Medications: I have reviewed the patient's current  medications.  Current Outpatient Medications  Medication Sig Dispense Refill   acetaminophen (TYLENOL) 500 MG tablet Take 1,000 mg by mouth every 6 (six) hours as needed. For pain.     ALPRAZolam (XANAX XR) 0.5 MG 24 hr tablet Take 0.5 mg by mouth daily as needed.     atorvastatin (LIPITOR) 40 MG tablet Take 40 mg by mouth daily.     fluconazole (DIFLUCAN) 150 MG tablet Take 150 mg by mouth daily.     FLUoxetine (PROZAC) 10 MG capsule Take 1 capsule (10 mg total) by mouth daily. 30 capsule 2   glipiZIDE (GLUCOTROL XL) 5 MG 24 hr tablet Take 1 tablet (5 mg total) by mouth daily with breakfast. 30 tablet 1   hydrochlorothiazide (HYDRODIURIL) 50 MG tablet Take 50 mg by mouth daily.     ibuprofen (ADVIL,MOTRIN) 200 MG tablet Take 400 mg by mouth as needed for moderate pain.     lisinopril (PRINIVIL,ZESTRIL) 20 MG tablet Take 20 mg by mouth daily.     LORazepam (ATIVAN) 1 MG tablet Take 1 tablet (1 mg total) by mouth 2 (two) times daily. 30 tablet 0   metFORMIN (GLUCOPHAGE-XR) 500 MG 24 hr tablet Take 500 mg by mouth daily.     metoprolol succinate (TOPROL-XL) 50 MG 24 hr tablet Take 1 tablet (50 mg total) by mouth daily. Take with or immediately following a meal. (Patient not taking: Reported on 07/04/2018) 90 tablet 1   Omega-3 Fatty Acids (FISH OIL) 1000 MG CAPS Take 2,000 mg by mouth daily.     omeprazole (PRILOSEC) 40 MG capsule Take 40 mg by  mouth daily.     ondansetron (ZOFRAN) 4 MG tablet Take 1 tablet (4 mg total) by mouth every 8 (eight) hours as needed for up to 20 doses for nausea or vomiting. 20 tablet 0   oseltamivir (TAMIFLU) 75 MG capsule Take 1 capsule (75 mg total) by mouth every 12 (twelve) hours. 10 capsule 0   OZEMPIC, 1 MG/DOSE, 2 MG/1.5ML SOPN SMARTSIG:1 Pre-Filled Pen Syringe SUB-Q Once a Week     vitamin C (ASCORBIC ACID) 500 MG tablet Take 1,000 mg by mouth daily.     Vitamin D, Ergocalciferol, (DRISDOL) 1.25 MG (50000 UNIT) CAPS capsule Take 50,000 Units  by mouth once a week.     No current facility-administered medications for this visit.    Medication Side Effects: None  Allergies: No Known Allergies  Past Medical History:  Diagnosis Date   Anxiety    Diabetes mellitus without complication (HCC)    Hypertension     No family history on file.  Social History   Socioeconomic History   Marital status: Married    Spouse name: Not on file   Number of children: Not on file   Years of education: Not on file   Highest education level: Not on file  Occupational History   Not on file  Tobacco Use   Smoking status: Current Every Day Smoker    Types: Cigars   Smokeless tobacco: Never Used  Vaping Use   Vaping Use: Never used  Substance and Sexual Activity   Alcohol use: Yes    Comment: occ   Drug use: No   Sexual activity: Not Currently  Other Topics Concern   Not on file  Social History Narrative   Not on file   Social Determinants of Health   Financial Resource Strain:    Difficulty of Paying Living Expenses: Not on file  Food Insecurity:    Worried About Running Out of Food in the Last Year: Not on file   Ran Out of Food in the Last Year: Not on file  Transportation Needs:    Lack of Transportation (Medical): Not on file   Lack of Transportation (Non-Medical): Not on file  Physical Activity:    Days of Exercise per Week: Not on file   Minutes of Exercise per Session: Not on file  Stress:    Feeling of Stress : Not on file  Social Connections:    Frequency of Communication with Friends and Family: Not on file   Frequency of Social Gatherings with Friends and Family: Not on file   Attends Religious Services: Not on file   Active Member of Clubs or Organizations: Not on file   Attends Banker Meetings: Not on file   Marital Status: Not on file  Intimate Partner Violence:    Fear of Current or Ex-Partner: Not on file   Emotionally Abused: Not on file   Physically  Abused: Not on file   Sexually Abused: Not on file    Past Medical History, Surgical history, Social history, and Family history were reviewed and updated as appropriate.   Please see review of systems for further details on the patient's review from today.   Objective:   Physical Exam:  There were no vitals taken for this visit.  Physical Exam Constitutional:      General: She is not in acute distress. Musculoskeletal:        General: No deformity.  Neurological:     Mental Status: She is alert and  oriented to person, place, and time.     Coordination: Coordination normal.  Psychiatric:        Attention and Perception: Attention and perception normal. She does not perceive auditory or visual hallucinations.        Mood and Affect: Mood is anxious and depressed. Affect is not labile, blunt, angry or inappropriate.        Speech: Speech normal.        Behavior: Behavior normal.        Thought Content: Thought content normal. Thought content is not paranoid or delusional. Thought content does not include homicidal or suicidal ideation. Thought content does not include homicidal or suicidal plan.        Cognition and Memory: Cognition and memory normal.        Judgment: Judgment normal.     Comments: Insight intact     Lab Review:     Component Value Date/Time   NA 133 (L) 10/10/2019 1441   K 3.2 (L) 10/10/2019 1441   CL 99 10/10/2019 1441   CO2 23 10/10/2019 1441   GLUCOSE 91 10/10/2019 1441   BUN 12 10/10/2019 1441   CREATININE 0.82 10/10/2019 1441   CALCIUM 9.5 10/10/2019 1441   PROT 8.3 (H) 10/10/2019 1441   ALBUMIN 4.0 10/10/2019 1441   AST 31 10/10/2019 1441   ALT 41 10/10/2019 1441   ALKPHOS 60 10/10/2019 1441   BILITOT 1.0 10/10/2019 1441   GFRNONAA >60 10/10/2019 1441   GFRAA >60 10/10/2019 1441       Component Value Date/Time   WBC 10.4 10/10/2019 1441   RBC 4.05 10/10/2019 1441   HGB 13.5 10/10/2019 1441   HCT 38.4 10/10/2019 1441   PLT 289  10/10/2019 1441   MCV 94.8 10/10/2019 1441   MCH 33.3 10/10/2019 1441   MCHC 35.2 10/10/2019 1441   RDW 12.4 10/10/2019 1441   LYMPHSABS 3.9 10/01/2011 1213   MONOABS 1.0 10/01/2011 1213   EOSABS 0.1 10/01/2011 1213   BASOSABS 0.0 10/01/2011 1213    No results found for: POCLITH, LITHIUM   No results found for: PHENYTOIN, PHENOBARB, VALPROATE, CBMZ   .res Assessment: Plan:    Plan:  PDMP reviewed  1. D/C Xanax 0.5mg  daily to BID 2. Add Ativan 1mg  BID 3. Add Prozac 10mg  daily  Patient disabled at this time. She is to remain out of work 02/14/2020 to 03/18/2020. She will return in 4 weeks for re-evaluation.  Refer to [facility] for ADHD testing.  [facility] 02/16/2020 - therapist.  RTC 4 weeks  Patient advised to contact office with any questions, adverse effects, or acute worsening in signs and symptoms.   Greater than 50% of face to face time with patient was spent on counseling and coordination of care.    Diagnoses and all orders for this visit:  Generalized anxiety disorder -     FLUoxetine (PROZAC) 10 MG capsule; Take 1 capsule (10 mg total) by mouth daily. -     LORazepam (ATIVAN) 1 MG tablet; Take 1 tablet (1 mg total) by mouth 2 (two) times daily.  Major depressive disorder, recurrent episode, moderate (HCC) -     FLUoxetine (PROZAC) 10 MG capsule; Take 1 capsule (10 mg total) by mouth daily.  Mixed obsessional thoughts and acts -     FLUoxetine (PROZAC) 10 MG capsule; Take 1 capsule (10 mg total) by mouth daily.  Panic attacks -     LORazepam (ATIVAN) 1 MG tablet; Take 1 tablet (1 mg  total) by mouth 2 (two) times daily.     Please see After Visit Summary for patient specific instructions.  No future appointments.  No orders of the defined types were placed in this encounter.   -------------------------------

## 2020-02-15 ENCOUNTER — Telehealth: Payer: Self-pay | Admitting: Adult Health

## 2020-02-15 NOTE — Telephone Encounter (Signed)
Received a fax from [employer] to complete a disability form on [place].

## 2020-02-19 ENCOUNTER — Telehealth: Payer: Self-pay | Admitting: Adult Health

## 2020-02-19 DIAGNOSIS — Z0289 Encounter for other administrative examinations: Secondary | ICD-10-CM

## 2020-02-19 NOTE — Telephone Encounter (Signed)
[employer] disability forms and complete and faxed with requested records.

## 2020-02-20 NOTE — Telephone Encounter (Signed)
Noted  

## 2020-02-20 NOTE — Telephone Encounter (Signed)
Paperwork completed and signed by [name]

## 2020-02-28 ENCOUNTER — Other Ambulatory Visit: Payer: Self-pay | Admitting: Adult Health

## 2020-02-28 DIAGNOSIS — F410 Panic disorder [episodic paroxysmal anxiety] without agoraphobia: Secondary | ICD-10-CM

## 2020-02-28 DIAGNOSIS — F411 Generalized anxiety disorder: Secondary | ICD-10-CM

## 2020-02-29 ENCOUNTER — Other Ambulatory Visit: Payer: Self-pay | Admitting: Adult Health

## 2020-02-29 ENCOUNTER — Telehealth: Payer: Self-pay | Admitting: Adult Health

## 2020-02-29 DIAGNOSIS — F411 Generalized anxiety disorder: Secondary | ICD-10-CM

## 2020-02-29 DIAGNOSIS — F410 Panic disorder [episodic paroxysmal anxiety] without agoraphobia: Secondary | ICD-10-CM

## 2020-02-29 MED ORDER — LORAZEPAM 1 MG PO TABS: 1.0000 mg | ORAL_TABLET | Freq: Two times a day (BID) | ORAL | 0 refills | Status: DC

## 2020-02-29 NOTE — Telephone Encounter (Signed)
It was denied yesterday, she has apt now

## 2020-02-29 NOTE — Telephone Encounter (Signed)
Pt called requesting refill for Lorazepam 2/d @ [facility] on file. Apt 11/22. Pt has 1 pill left.

## 2020-03-07 ENCOUNTER — Other Ambulatory Visit: Payer: Self-pay | Admitting: Adult Health

## 2020-03-07 DIAGNOSIS — F411 Generalized anxiety disorder: Secondary | ICD-10-CM

## 2020-03-07 DIAGNOSIS — F331 Major depressive disorder, recurrent, moderate: Secondary | ICD-10-CM

## 2020-03-07 DIAGNOSIS — F422 Mixed obsessional thoughts and acts: Secondary | ICD-10-CM

## 2020-03-17 ENCOUNTER — Telehealth: Payer: Self-pay | Admitting: Adult Health

## 2020-03-17 ENCOUNTER — Other Ambulatory Visit: Payer: Self-pay

## 2020-03-17 ENCOUNTER — Ambulatory Visit (INDEPENDENT_AMBULATORY_CARE_PROVIDER_SITE_OTHER): Payer: No Typology Code available for payment source | Admitting: Adult Health

## 2020-03-17 ENCOUNTER — Encounter: Payer: Self-pay | Admitting: Adult Health

## 2020-03-17 DIAGNOSIS — F411 Generalized anxiety disorder: Secondary | ICD-10-CM

## 2020-03-17 DIAGNOSIS — F422 Mixed obsessional thoughts and acts: Secondary | ICD-10-CM

## 2020-03-17 DIAGNOSIS — F410 Panic disorder [episodic paroxysmal anxiety] without agoraphobia: Secondary | ICD-10-CM | POA: Diagnosis not present

## 2020-03-17 DIAGNOSIS — F331 Major depressive disorder, recurrent, moderate: Secondary | ICD-10-CM

## 2020-03-17 MED ORDER — ALPRAZOLAM 0.5 MG PO TABS: 0.5000 mg | ORAL_TABLET | Freq: Two times a day (BID) | ORAL | 2 refills | Status: DC | PRN

## 2020-03-17 MED ORDER — FLUOXETINE HCL 20 MG PO CAPS: 20.0000 mg | ORAL_CAPSULE | Freq: Every day | ORAL | 5 refills | Status: DC

## 2020-03-17 NOTE — Telephone Encounter (Signed)
Received a fax from [facility] for short term disability form completion.

## 2020-03-17 NOTE — Telephone Encounter (Signed)
Pt called to ask nurse to change dates on FMLA paperwork to 03/30/20 and return to work date 03/31/20.Fax to number on form. Next apt 12/20 Contact # [phone] if needed

## 2020-03-17 NOTE — Progress Notes (Signed)
[name] [id number] 1975/07/07 44 y.o.  Subjective:   Patient ID:  Gwendolyn Carrillo is a 44 y.o. (DOB 1975/11/05) female.  Chief Complaint: No chief complaint on file.   HPI Gwendolyn Carrillo presents to the office today for follow-up of MDD, mixed obsessional thoughts , panic attacks, and GAD.   Describes mood today as "a little better, not great". Tearful at times, but not as often. Having depressed days - "not as many". Feels like Ativan is not helping - "it's making me dizzy". Reports panic attacks. Waking up from sleep "not able to breathe". Feels like environmental stressors are also contrubuting to current mood. Stating "I feel like the Prozac is working finally"Wanting to do more than she was - "maybe a 20% difference. Continues to worry and ruminate. Anticipation of "what is going to happen". Son has schizophrenia and she worries about him. Has a lot of "dread". No desire for sex or affection. Has improved ADL's - not as hard. Mostly staying home. Decreased interest and motivation. Taking medications as prescribed. Does not feel like she is capable of returning to work.  Energy levels remain low. Active, does not have a regular exercise routine.  Not able to enjoy usual interests and activities. Single. Lives with boyfriend x 3 years. Has 4 children 25, 19, 23, and 15. Family local. Spending time with family. Appetite improved. Weight stabilizing - 138 pounds.  Sleeps better some nights than others. Averages 6 hours and then wakes up early. Focus and concentration difficulties. Appointment with [facility] pending. Completing tasks. Managing some aspects of household. Works full time for [employer]. Out on disability currently. Denies SI or HI.  Denies AH or VH.  Previous medication trials: Xanax 0.5mg  daily, Lexapro x 1 year - felt tired all the time, Trintellix, any antidepressant.     Review of Systems:  Review of Systems  Musculoskeletal: Negative for gait problem.   Neurological: Negative for tremors.  Psychiatric/Behavioral:       Please refer to HPI    Medications: I have reviewed the patient's current medications.  Current Outpatient Medications  Medication Sig Dispense Refill   acetaminophen (TYLENOL) 500 MG tablet Take 1,000 mg by mouth every 6 (six) hours as needed. For pain.     ALPRAZolam (XANAX XR) 0.5 MG 24 hr tablet Take 0.5 mg by mouth daily as needed.     ALPRAZolam (XANAX) 0.5 MG tablet Take 1 tablet (0.5 mg total) by mouth 2 (two) times daily as needed for anxiety. 60 tablet 2   atorvastatin (LIPITOR) 40 MG tablet Take 40 mg by mouth daily.     fluconazole (DIFLUCAN) 150 MG tablet Take 150 mg by mouth daily.     FLUoxetine (PROZAC) 20 MG capsule Take 1 capsule (20 mg total) by mouth daily. 30 capsule 5   glipiZIDE (GLUCOTROL XL) 5 MG 24 hr tablet Take 1 tablet (5 mg total) by mouth daily with breakfast. 30 tablet 1   hydrochlorothiazide (HYDRODIURIL) 50 MG tablet Take 50 mg by mouth daily.     ibuprofen (ADVIL,MOTRIN) 200 MG tablet Take 400 mg by mouth as needed for moderate pain.     lisinopril (PRINIVIL,ZESTRIL) 20 MG tablet Take 20 mg by mouth daily.     LORazepam (ATIVAN) 1 MG tablet Take 1 tablet (1 mg total) by mouth 2 (two) times daily. 60 tablet 0   metFORMIN (GLUCOPHAGE-XR) 500 MG 24 hr tablet Take 500 mg by mouth daily.     metoprolol succinate (TOPROL-XL) 50  MG 24 hr tablet Take 1 tablet (50 mg total) by mouth daily. Take with or immediately following a meal. (Patient not taking: Reported on 07/04/2018) 90 tablet 1   Omega-3 Fatty Acids (FISH OIL) 1000 MG CAPS Take 2,000 mg by mouth daily.     omeprazole (PRILOSEC) 40 MG capsule Take 40 mg by mouth daily.     ondansetron (ZOFRAN) 4 MG tablet Take 1 tablet (4 mg total) by mouth every 8 (eight) hours as needed for up to 20 doses for nausea or vomiting. 20 tablet 0   oseltamivir (TAMIFLU) 75 MG capsule Take 1 capsule (75 mg total) by mouth every 12 (twelve)  hours. 10 capsule 0   OZEMPIC, 1 MG/DOSE, 2 MG/1.5ML SOPN SMARTSIG:1 Pre-Filled Pen Syringe SUB-Q Once a Week     vitamin C (ASCORBIC ACID) 500 MG tablet Take 1,000 mg by mouth daily.     Vitamin D, Ergocalciferol, (DRISDOL) 1.25 MG (50000 UNIT) CAPS capsule Take 50,000 Units by mouth once a week.     No current facility-administered medications for this visit.    Medication Side Effects: None  Allergies: No Known Allergies  Past Medical History:  Diagnosis Date   Anxiety    Diabetes mellitus without complication (HCC)    Hypertension     No family history on file.  Social History   Socioeconomic History   Marital status: Married    Spouse name: Not on file   Number of children: Not on file   Years of education: Not on file   Highest education level: Not on file  Occupational History   Not on file  Tobacco Use   Smoking status: Current Every Day Smoker    Types: Cigars   Smokeless tobacco: Never Used  Vaping Use   Vaping Use: Never used  Substance and Sexual Activity   Alcohol use: Yes    Comment: occ   Drug use: No   Sexual activity: Not Currently  Other Topics Concern   Not on file  Social History Narrative   Not on file   Social Determinants of Health   Financial Resource Strain:    Difficulty of Paying Living Expenses: Not on file  Food Insecurity:    Worried About Running Out of Food in the Last Year: Not on file   Ran Out of Food in the Last Year: Not on file  Transportation Needs:    Lack of Transportation (Medical): Not on file   Lack of Transportation (Non-Medical): Not on file  Physical Activity:    Days of Exercise per Week: Not on file   Minutes of Exercise per Session: Not on file  Stress:    Feeling of Stress : Not on file  Social Connections:    Frequency of Communication with Friends and Family: Not on file   Frequency of Social Gatherings with Friends and Family: Not on file   Attends Religious Services:  Not on file   Active Member of Clubs or Organizations: Not on file   Attends Banker Meetings: Not on file   Marital Status: Not on file  Intimate Partner Violence:    Fear of Current or Ex-Partner: Not on file   Emotionally Abused: Not on file   Physically Abused: Not on file   Sexually Abused: Not on file    Past Medical History, Surgical history, Social history, and Family history were reviewed and updated as appropriate.   Please see review of systems for further details on the patient's  review from today.   Objective:   Physical Exam:  There were no vitals taken for this visit.  Physical Exam Constitutional:      General: She is not in acute distress. Musculoskeletal:        General: No deformity.  Neurological:     Mental Status: She is alert and oriented to person, place, and time.     Coordination: Coordination normal.  Psychiatric:        Attention and Perception: Attention and perception normal. She does not perceive auditory or visual hallucinations.        Mood and Affect: Mood is anxious and depressed. Affect is not labile, blunt, angry or inappropriate.        Speech: Speech normal.        Behavior: Behavior normal.        Thought Content: Thought content normal. Thought content is not paranoid or delusional. Thought content does not include homicidal or suicidal ideation. Thought content does not include homicidal or suicidal plan.        Cognition and Memory: Cognition and memory normal.        Judgment: Judgment normal.     Comments: Insight intact     Lab Review:     Component Value Date/Time   NA 133 (L) 10/10/2019 1441   K 3.2 (L) 10/10/2019 1441   CL 99 10/10/2019 1441   CO2 23 10/10/2019 1441   GLUCOSE 91 10/10/2019 1441   BUN 12 10/10/2019 1441   CREATININE 0.82 10/10/2019 1441   CALCIUM 9.5 10/10/2019 1441   PROT 8.3 (H) 10/10/2019 1441   ALBUMIN 4.0 10/10/2019 1441   AST 31 10/10/2019 1441   ALT 41 10/10/2019 1441    ALKPHOS 60 10/10/2019 1441   BILITOT 1.0 10/10/2019 1441   GFRNONAA >60 10/10/2019 1441   GFRAA >60 10/10/2019 1441       Component Value Date/Time   WBC 10.4 10/10/2019 1441   RBC 4.05 10/10/2019 1441   HGB 13.5 10/10/2019 1441   HCT 38.4 10/10/2019 1441   PLT 289 10/10/2019 1441   MCV 94.8 10/10/2019 1441   MCH 33.3 10/10/2019 1441   MCHC 35.2 10/10/2019 1441   RDW 12.4 10/10/2019 1441   LYMPHSABS 3.9 10/01/2011 1213   MONOABS 1.0 10/01/2011 1213   EOSABS 0.1 10/01/2011 1213   BASOSABS 0.0 10/01/2011 1213    No results found for: POCLITH, LITHIUM   No results found for: PHENYTOIN, PHENOBARB, VALPROATE, CBMZ   .res Assessment: Plan:    Plan:  PDMP reviewed  1. Restart Xanax 0.5mg  daily to BID 2. D/C Ativan 1mg  BID 3. Increase Prozac 10mg  to 20mg  daily  Patient disabled at this time. She is to remain out of work 03/17/2020 to 03/30/2020. Patient wanting to give medication changes a few more weeks before returning to work. Improving. She will plan to return to work on 03/31/2020 without restrictions.  Refer to [facility] for ADHD testing.  Continue therapy - [facility] - therapist.  RTC 4 weeks  Patient advised to contact office with any questions, adverse effects, or acute worsening in signs and symptoms.   Diagnoses and all orders for this visit:  Panic attacks  Generalized anxiety disorder -     FLUoxetine (PROZAC) 20 MG capsule; Take 1 capsule (20 mg total) by mouth daily. -     ALPRAZolam (XANAX) 0.5 MG tablet; Take 1 tablet (0.5 mg total) by mouth 2 (two) times daily as needed for anxiety.  Major depressive  disorder, recurrent episode, moderate (HCC) -     FLUoxetine (PROZAC) 20 MG capsule; Take 1 capsule (20 mg total) by mouth daily.  Mixed obsessional thoughts and acts -     FLUoxetine (PROZAC) 20 MG capsule; Take 1 capsule (20 mg total) by mouth daily.     Please see After Visit Summary for patient specific instructions.  No future  appointments.  No orders of the defined types were placed in this encounter.   -------------------------------

## 2020-03-18 NOTE — Telephone Encounter (Signed)
Noted, working on her forms

## 2020-03-19 NOTE — Telephone Encounter (Signed)
Noted  

## 2020-03-19 NOTE — Telephone Encounter (Signed)
Forms completed

## 2020-03-19 NOTE — Telephone Encounter (Signed)
Forms completed and given to Gina to sign  

## 2020-03-24 ENCOUNTER — Telehealth: Payer: Self-pay | Admitting: Adult Health

## 2020-03-24 NOTE — Telephone Encounter (Signed)
noted 

## 2020-03-24 NOTE — Telephone Encounter (Signed)
STD forms with records were faxed to [employer]

## 2020-03-25 ENCOUNTER — Telehealth: Payer: Self-pay | Admitting: Adult Health

## 2020-03-26 NOTE — Telephone Encounter (Signed)
Error

## 2020-04-08 ENCOUNTER — Other Ambulatory Visit: Payer: Self-pay | Admitting: Adult Health

## 2020-04-08 DIAGNOSIS — F411 Generalized anxiety disorder: Secondary | ICD-10-CM

## 2020-04-08 DIAGNOSIS — F422 Mixed obsessional thoughts and acts: Secondary | ICD-10-CM

## 2020-04-08 DIAGNOSIS — F331 Major depressive disorder, recurrent, moderate: Secondary | ICD-10-CM

## 2020-04-14 ENCOUNTER — Ambulatory Visit (INDEPENDENT_AMBULATORY_CARE_PROVIDER_SITE_OTHER): Payer: No Typology Code available for payment source | Admitting: Adult Health

## 2020-04-14 ENCOUNTER — Encounter: Payer: Self-pay | Admitting: Adult Health

## 2020-04-14 ENCOUNTER — Other Ambulatory Visit: Payer: Self-pay

## 2020-04-14 DIAGNOSIS — F411 Generalized anxiety disorder: Secondary | ICD-10-CM

## 2020-04-14 DIAGNOSIS — F331 Major depressive disorder, recurrent, moderate: Secondary | ICD-10-CM | POA: Diagnosis not present

## 2020-04-14 DIAGNOSIS — F422 Mixed obsessional thoughts and acts: Secondary | ICD-10-CM | POA: Diagnosis not present

## 2020-04-14 DIAGNOSIS — G4700 Insomnia, unspecified: Secondary | ICD-10-CM

## 2020-04-14 DIAGNOSIS — F410 Panic disorder [episodic paroxysmal anxiety] without agoraphobia: Secondary | ICD-10-CM | POA: Diagnosis not present

## 2020-04-14 MED ORDER — HYDROXYZINE HCL 25 MG PO TABS: ORAL_TABLET | ORAL | 2 refills | Status: DC

## 2020-04-14 NOTE — Progress Notes (Signed)
SHANDEL BUSIC [id number] [birth year] 44 y.o.  Subjective:   Patient ID:  Gwendolyn Carrillo is a 44 y.o. (DOB Apr 01, 1976) female.  Chief Complaint: No chief complaint on file.   HPI RHYLIE STEHR presents to the office today for follow-up of MDD, mixed obsessional thoughts , panic attacks, and GAD.   Describes mood today as "not the best". Tearful throughout interview. Pleasant. Mood symptoms - feels depressed, anxious, and irritable. More anxious overall. Stating "I'm not doing to well". Reports panic attacks. Loosing sleep. She and boyfriend of 3 years have broken up - "he put me out". Her mother has agreed to let her come back home. Does not feel capable of working right now. Stating "I need to have a few weeks to get straightened out". Not able to complete ADL's. Stating "I feel sick physically and emotionally". Decreased interest and motivation. Taking medications as prescribed. Seeing therapist.  Energy levels low. Active, does not have a regular exercise routine.  Not able to enjoy usual interests and activities. Single. Has 4 children 25, 19, 23, and 15. Family local. Spending time with family. Appetite improved. Weight gain 8 pounds - 146 pounds.  Has not been sleeping - "it's really bad". Mind is constantly racing. Averages 4 hours. Focus and concentration difficulties. Completing tasks. Managing some aspects of household. Works full time for [employer].  Denies SI or HI.  Denies AH or VH.  Previous medication trials: Xanax 0.5mg  daily, Lexapro x 1 year - felt tired all the time, Trintellix, any antidepressant.    Review of Systems:  Review of Systems  Musculoskeletal: Negative for gait problem.  Neurological: Negative for tremors.  Psychiatric/Behavioral:       Please refer to HPI    Medications: I have reviewed the patient's current medications.  Current Outpatient Medications  Medication Sig Dispense Refill   acetaminophen (TYLENOL) 500 MG tablet Take 1,000 mg by  mouth every 6 (six) hours as needed. For pain.     ALPRAZolam (XANAX XR) 0.5 MG 24 hr tablet Take 0.5 mg by mouth daily as needed.     ALPRAZolam (XANAX) 0.5 MG tablet Take 1 tablet (0.5 mg total) by mouth 2 (two) times daily as needed for anxiety. 60 tablet 2   atorvastatin (LIPITOR) 40 MG tablet Take 40 mg by mouth daily.     fluconazole (DIFLUCAN) 150 MG tablet Take 150 mg by mouth daily.     FLUoxetine (PROZAC) 20 MG capsule Take 1 capsule (20 mg total) by mouth daily. 30 capsule 5   glipiZIDE (GLUCOTROL XL) 5 MG 24 hr tablet Take 1 tablet (5 mg total) by mouth daily with breakfast. 30 tablet 1   hydrochlorothiazide (HYDRODIURIL) 50 MG tablet Take 50 mg by mouth daily.     hydrOXYzine (ATARAX/VISTARIL) 25 MG tablet Take one to two capsules at bedtime. 60 tablet 2   ibuprofen (ADVIL,MOTRIN) 200 MG tablet Take 400 mg by mouth as needed for moderate pain.     lisinopril (PRINIVIL,ZESTRIL) 20 MG tablet Take 20 mg by mouth daily.     LORazepam (ATIVAN) 1 MG tablet Take 1 tablet (1 mg total) by mouth 2 (two) times daily. 60 tablet 0   metFORMIN (GLUCOPHAGE-XR) 500 MG 24 hr tablet Take 500 mg by mouth daily.     metoprolol succinate (TOPROL-XL) 50 MG 24 hr tablet Take 1 tablet (50 mg total) by mouth daily. Take with or immediately following a meal. (Patient not taking: Reported on 07/04/2018) 90 tablet 1  Omega-3 Fatty Acids (FISH OIL) 1000 MG CAPS Take 2,000 mg by mouth daily.     omeprazole (PRILOSEC) 40 MG capsule Take 40 mg by mouth daily.     ondansetron (ZOFRAN) 4 MG tablet Take 1 tablet (4 mg total) by mouth every 8 (eight) hours as needed for up to 20 doses for nausea or vomiting. 20 tablet 0   oseltamivir (TAMIFLU) 75 MG capsule Take 1 capsule (75 mg total) by mouth every 12 (twelve) hours. 10 capsule 0   OZEMPIC, 1 MG/DOSE, 2 MG/1.5ML SOPN SMARTSIG:1 Pre-Filled Pen Syringe SUB-Q Once a Week     vitamin C (ASCORBIC ACID) 500 MG tablet Take 1,000 mg by mouth daily.      Vitamin D, Ergocalciferol, (DRISDOL) 1.25 MG (50000 UNIT) CAPS capsule Take 50,000 Units by mouth once a week.     No current facility-administered medications for this visit.    Medication Side Effects: None  Allergies: No Known Allergies  Past Medical History:  Diagnosis Date   Anxiety    Diabetes mellitus without complication (HCC)    Hypertension     No family history on file.  Social History   Socioeconomic History   Marital status: Married    Spouse name: Not on file   Number of children: Not on file   Years of education: Not on file   Highest education level: Not on file  Occupational History   Not on file  Tobacco Use   Smoking status: Current Every Day Smoker    Types: Cigars   Smokeless tobacco: Never Used  Vaping Use   Vaping Use: Never used  Substance and Sexual Activity   Alcohol use: Yes    Comment: occ   Drug use: No   Sexual activity: Not Currently  Other Topics Concern   Not on file  Social History Narrative   Not on file   Social Determinants of Health   Financial Resource Strain: Not on file  Food Insecurity: Not on file  Transportation Needs: Not on file  Physical Activity: Not on file  Stress: Not on file  Social Connections: Not on file  Intimate Partner Violence: Not on file    Past Medical History, Surgical history, Social history, and Family history were reviewed and updated as appropriate.   Please see review of systems for further details on the patient's review from today.   Objective:   Physical Exam:  There were no vitals taken for this visit.  Physical Exam Constitutional:      General: She is not in acute distress. Musculoskeletal:        General: No deformity.  Neurological:     Mental Status: She is alert and oriented to person, place, and time.     Coordination: Coordination normal.  Psychiatric:        Attention and Perception: Attention and perception normal. She does not perceive auditory or  visual hallucinations.        Mood and Affect: Mood is anxious and depressed. Affect is flat and tearful. Affect is not labile, blunt, angry or inappropriate.        Speech: Speech normal.        Behavior: Behavior is withdrawn.        Thought Content: Thought content normal. Thought content is not paranoid or delusional. Thought content does not include homicidal or suicidal ideation. Thought content does not include homicidal or suicidal plan.        Cognition and Memory: Cognition and memory  normal.        Judgment: Judgment normal.     Comments: Insight intact     Lab Review:     Component Value Date/Time   NA 133 (L) 10/10/2019 1441   K 3.2 (L) 10/10/2019 1441   CL 99 10/10/2019 1441   CO2 23 10/10/2019 1441   GLUCOSE 91 10/10/2019 1441   BUN 12 10/10/2019 1441   CREATININE 0.82 10/10/2019 1441   CALCIUM 9.5 10/10/2019 1441   PROT 8.3 (H) 10/10/2019 1441   ALBUMIN 4.0 10/10/2019 1441   AST 31 10/10/2019 1441   ALT 41 10/10/2019 1441   ALKPHOS 60 10/10/2019 1441   BILITOT 1.0 10/10/2019 1441   GFRNONAA >60 10/10/2019 1441   GFRAA >60 10/10/2019 1441       Component Value Date/Time   WBC 10.4 10/10/2019 1441   RBC 4.05 10/10/2019 1441   HGB 13.5 10/10/2019 1441   HCT 38.4 10/10/2019 1441   PLT 289 10/10/2019 1441   MCV 94.8 10/10/2019 1441   MCH 33.3 10/10/2019 1441   MCHC 35.2 10/10/2019 1441   RDW 12.4 10/10/2019 1441   LYMPHSABS 3.9 10/01/2011 1213   MONOABS 1.0 10/01/2011 1213   EOSABS 0.1 10/01/2011 1213   BASOSABS 0.0 10/01/2011 1213    No results found for: POCLITH, LITHIUM   No results found for: PHENYTOIN, PHENOBARB, VALPROATE, CBMZ   .res Assessment: Plan:    Plan:  PDMP reviewed  1. Increase Xanax 0.5mg  BID to TID 2. Continue Prozac 20mg  daily 3. Add Hydroxyzine 25mg  - 1 to 2 caps at bedtime.  Patient disabled at this time. She is to remain out of work 04/14/2020 to 04/28/2020. She will plan to return to work on 04/29/2020 without  restrictions.  Refer to [facility] for ADHD testing.  Continue therapy - [facility] 06/26/2020 - therapist.  RTC 4 weeks  Patient advised to contact office with any questions, adverse effects, or acute worsening in signs and symptoms.  Discussed potential benefits, risk, and side effects of benzodiazepines to include potential risk of tolerance and dependence, as well as possible drowsiness.  Advised patient not to drive if experiencing drowsiness and to take lowest possible effective dose to minimize risk of dependence and tolerance.   Diagnoses and all orders for this visit:  Generalized anxiety disorder -     hydrOXYzine (ATARAX/VISTARIL) 25 MG tablet; Take one to two capsules at bedtime.  Major depressive disorder, recurrent episode, moderate (HCC)  Mixed obsessional thoughts and acts  Panic attacks -     hydrOXYzine (ATARAX/VISTARIL) 25 MG tablet; Take one to two capsules at bedtime.  Insomnia, unspecified type -     hydrOXYzine (ATARAX/VISTARIL) 25 MG tablet; Take one to two capsules at bedtime.     Please see After Visit Summary for patient specific instructions.  No future appointments.  No orders of the defined types were placed in this encounter.   -------------------------------

## 2020-04-16 ENCOUNTER — Telehealth: Payer: Self-pay | Admitting: Adult Health

## 2020-04-16 NOTE — Telephone Encounter (Signed)
Received Short Term Disability fax from [employer] regarding Gwendolyn Carrillo.

## 2020-04-16 NOTE — Telephone Encounter (Signed)
FYI

## 2020-04-23 ENCOUNTER — Telehealth: Payer: Self-pay | Admitting: Adult Health

## 2020-04-23 NOTE — Telephone Encounter (Signed)
Error

## 2020-04-24 ENCOUNTER — Telehealth: Payer: Self-pay | Admitting: Adult Health

## 2020-04-24 NOTE — Telephone Encounter (Signed)
Noted  

## 2020-04-24 NOTE — Telephone Encounter (Signed)
Form completed and given to Almira Coaster to review and sign for restrictions and limitations.

## 2020-04-24 NOTE — Telephone Encounter (Signed)
Form and records faxed to [facility]

## 2020-04-29 NOTE — Telephone Encounter (Signed)
noted 

## 2020-05-12 ENCOUNTER — Ambulatory Visit: Payer: No Typology Code available for payment source | Admitting: Adult Health

## 2020-05-12 ENCOUNTER — Other Ambulatory Visit: Payer: Self-pay | Admitting: Adult Health

## 2020-05-12 DIAGNOSIS — F422 Mixed obsessional thoughts and acts: Secondary | ICD-10-CM

## 2020-05-12 DIAGNOSIS — F331 Major depressive disorder, recurrent, moderate: Secondary | ICD-10-CM

## 2020-05-12 DIAGNOSIS — F411 Generalized anxiety disorder: Secondary | ICD-10-CM

## 2020-06-16 ENCOUNTER — Ambulatory Visit (INDEPENDENT_AMBULATORY_CARE_PROVIDER_SITE_OTHER): Payer: 59 | Admitting: Adult Health

## 2020-06-16 ENCOUNTER — Encounter: Payer: Self-pay | Admitting: Adult Health

## 2020-06-16 ENCOUNTER — Other Ambulatory Visit: Payer: Self-pay

## 2020-06-16 DIAGNOSIS — F411 Generalized anxiety disorder: Secondary | ICD-10-CM

## 2020-06-16 DIAGNOSIS — F410 Panic disorder [episodic paroxysmal anxiety] without agoraphobia: Secondary | ICD-10-CM

## 2020-06-16 DIAGNOSIS — F422 Mixed obsessional thoughts and acts: Secondary | ICD-10-CM

## 2020-06-16 DIAGNOSIS — F331 Major depressive disorder, recurrent, moderate: Secondary | ICD-10-CM

## 2020-06-16 MED ORDER — FLUOXETINE HCL 10 MG PO CAPS: 10.0000 mg | ORAL_CAPSULE | Freq: Every day | ORAL | 5 refills | Status: DC

## 2020-06-16 MED ORDER — FLUOXETINE HCL 20 MG PO CAPS: 20.0000 mg | ORAL_CAPSULE | Freq: Every day | ORAL | 5 refills | Status: DC

## 2020-06-16 MED ORDER — ALPRAZOLAM 1 MG PO TABS: 1.0000 mg | ORAL_TABLET | Freq: Two times a day (BID) | ORAL | 2 refills | Status: DC | PRN

## 2020-06-16 NOTE — Progress Notes (Signed)
Gwendolyn Carrillo [id number] 03/15/1976 45 y.o.  Subjective:   Patient ID:  Gwendolyn Carrillo is a 45 y.o. (DOB 04/11/1976) female.  Chief Complaint: No chief complaint on file.   HPI Gwendolyn Carrillo presents to the office today for follow-up of MDD, mixed obsessional thoughts , panic attacks, and GAD.   Describes mood today as "better". Denies tearfulness. Pleasant. Mood symptoms - decreased depression, anxiety, and irritability. Decreased panic attacks. Stating "I'm doing better". Quit previous job and has started at new job - working for [employer]. Stating "I love it". Is working in an office setting and feel that is much "better" for her.  Improved interest and motivation. Taking medications as prescribed. Seeing therapist.  Energy levels improved. Active, does not have a regular exercise routine.  Able to enjoy usual interests and activities. Single. Has 4 children 25, 19, 23, and 15. Family local. Spending time with family. Appetite improved. Weight gain 8 pounds - 146 pounds.  Sleep has improved. Averages 6 to 8 hours. Focus and concentration difficulties. Completing tasks. Managing some aspects of household. Works full time.  Denies SI or HI.  Denies AH or VH.  Previous medication trials: Xanax 0.5mg  daily, Lexapro x 1 year - felt tired all the time, Trintellix, any antidepressant.    Review of Systems:  Review of Systems  Musculoskeletal: Negative for gait problem.  Neurological: Negative for tremors.  Psychiatric/Behavioral:       Please refer to HPI    Medications: I have reviewed the patient's current medications.  Current Outpatient Medications  Medication Sig Dispense Refill  . FLUoxetine (PROZAC) 10 MG capsule Take 1 capsule (10 mg total) by mouth daily. 30 capsule 5  . acetaminophen (TYLENOL) 500 MG tablet Take 1,000 mg by mouth every 6 (six) hours as needed. For pain.    Marland Kitchen ALPRAZolam (XANAX) 1 MG tablet Take 1 tablet (1 mg total) by mouth  2 (two) times daily as needed for anxiety. 60 tablet 2  . atorvastatin (LIPITOR) 40 MG tablet Take 40 mg by mouth daily.    . fluconazole (DIFLUCAN) 150 MG tablet Take 150 mg by mouth daily.    Marland Kitchen FLUoxetine (PROZAC) 20 MG capsule Take 1 capsule (20 mg total) by mouth daily. 30 capsule 5  . glipiZIDE (GLUCOTROL XL) 5 MG 24 hr tablet Take 1 tablet (5 mg total) by mouth daily with breakfast. 30 tablet 1  . hydrochlorothiazide (HYDRODIURIL) 50 MG tablet Take 50 mg by mouth daily.    Marland Kitchen ibuprofen (ADVIL,MOTRIN) 200 MG tablet Take 400 mg by mouth as needed for moderate pain.    Marland Kitchen lisinopril (PRINIVIL,ZESTRIL) 20 MG tablet Take 20 mg by mouth daily.    . metFORMIN (GLUCOPHAGE-XR) 500 MG 24 hr tablet Take 500 mg by mouth daily.    . metoprolol succinate (TOPROL-XL) 50 MG 24 hr tablet Take 1 tablet (50 mg total) by mouth daily. Take with or immediately following a meal. (Patient not taking: Reported on 07/04/2018) 90 tablet 1  . Omega-3 Fatty Acids (FISH OIL) 1000 MG CAPS Take 2,000 mg by mouth daily.    Marland Kitchen omeprazole (PRILOSEC) 40 MG capsule Take 40 mg by mouth daily.    . ondansetron (ZOFRAN) 4 MG tablet Take 1 tablet (4 mg total) by mouth every 8 (eight) hours as needed for up to 20 doses for nausea or vomiting. 20 tablet 0  . oseltamivir (TAMIFLU) 75 MG capsule Take 1 capsule (75 mg total) by mouth every 12 (  twelve) hours. 10 capsule 0  . OZEMPIC, 1 MG/DOSE, 2 MG/1.5ML SOPN SMARTSIG:1 Pre-Filled Pen Syringe SUB-Q Once a Week    . vitamin C (ASCORBIC ACID) 500 MG tablet Take 1,000 mg by mouth daily.    . Vitamin D, Ergocalciferol, (DRISDOL) 1.25 MG (50000 UNIT) CAPS capsule Take 50,000 Units by mouth once a week.     No current facility-administered medications for this visit.    Medication Side Effects: None  Allergies: No Known Allergies  Past Medical History:  Diagnosis Date  . Anxiety   . Diabetes mellitus without complication (HCC)   . Hypertension     No family history on file.  Social  History   Socioeconomic History  . Marital status: Married    Spouse name: Not on file  . Number of children: Not on file  . Years of education: Not on file  . Highest education level: Not on file  Occupational History  . Not on file  Tobacco Use  . Smoking status: Current Every Day Smoker    Types: Cigars  . Smokeless tobacco: Never Used  Vaping Use  . Vaping Use: Never used  Substance and Sexual Activity  . Alcohol use: Yes    Comment: occ  . Drug use: No  . Sexual activity: Not Currently  Other Topics Concern  . Not on file  Social History Narrative  . Not on file   Social Determinants of Health   Financial Resource Strain: Not on file  Food Insecurity: Not on file  Transportation Needs: Not on file  Physical Activity: Not on file  Stress: Not on file  Social Connections: Not on file  Intimate Partner Violence: Not on file    Past Medical History, Surgical history, Social history, and Family history were reviewed and updated as appropriate.   Please see review of systems for further details on the patient's review from today.   Objective:   Physical Exam:  There were no vitals taken for this visit.  Physical Exam Constitutional:      General: She is not in acute distress. Musculoskeletal:        General: No deformity.  Neurological:     Mental Status: She is alert and oriented to person, place, and time.     Coordination: Coordination normal.  Psychiatric:        Attention and Perception: Attention and perception normal. She does not perceive auditory or visual hallucinations.        Mood and Affect: Mood normal. Mood is not anxious or depressed. Affect is not labile, blunt, angry or inappropriate.        Speech: Speech normal.        Behavior: Behavior normal.        Thought Content: Thought content normal. Thought content is not paranoid or delusional. Thought content does not include homicidal or suicidal ideation. Thought content does not include  homicidal or suicidal plan.        Cognition and Memory: Cognition and memory normal.        Judgment: Judgment normal.     Comments: Insight intact     Lab Review:     Component Value Date/Time   NA 133 (L) 10/10/2019 1441   K 3.2 (L) 10/10/2019 1441   CL 99 10/10/2019 1441   CO2 23 10/10/2019 1441   GLUCOSE 91 10/10/2019 1441   BUN 12 10/10/2019 1441   CREATININE 0.82 10/10/2019 1441   CALCIUM 9.5 10/10/2019 1441   PROT  8.3 (H) 10/10/2019 1441   ALBUMIN 4.0 10/10/2019 1441   AST 31 10/10/2019 1441   ALT 41 10/10/2019 1441   ALKPHOS 60 10/10/2019 1441   BILITOT 1.0 10/10/2019 1441   GFRNONAA >60 10/10/2019 1441   GFRAA >60 10/10/2019 1441       Component Value Date/Time   WBC 10.4 10/10/2019 1441   RBC 4.05 10/10/2019 1441   HGB 13.5 10/10/2019 1441   HCT 38.4 10/10/2019 1441   PLT 289 10/10/2019 1441   MCV 94.8 10/10/2019 1441   MCH 33.3 10/10/2019 1441   MCHC 35.2 10/10/2019 1441   RDW 12.4 10/10/2019 1441   LYMPHSABS 3.9 10/01/2011 1213   MONOABS 1.0 10/01/2011 1213   EOSABS 0.1 10/01/2011 1213   BASOSABS 0.0 10/01/2011 1213    No results found for: POCLITH, LITHIUM   No results found for: PHENYTOIN, PHENOBARB, VALPROATE, CBMZ   .res Assessment: Plan:    Plan:  PDMP reviewed  1. Increase Xanax 0.5mg  TID to 1mg  BID 2. Continue Prozac 20mg  daily 3. D/C Hydroxyzine 25mg  - 1 to 2 caps at bedtime.  Continue therapy - [facility] - therapist.  RTC 3 months  Patient advised to contact office with any questions, adverse effects, or acute worsening in signs and symptoms.  Discussed potential benefits, risk, and side effects of benzodiazepines to include potential risk of tolerance and dependence, as well as possible drowsiness.  Advised patient not to drive if experiencing drowsiness and to take lowest possible effective dose to minimize risk of dependence and tolerance.  Diagnoses and all orders for this visit:  Generalized anxiety disorder -      FLUoxetine (PROZAC) 20 MG capsule; Take 1 capsule (20 mg total) by mouth daily. -     ALPRAZolam (XANAX) 1 MG tablet; Take 1 tablet (1 mg total) by mouth 2 (two) times daily as needed for anxiety. -     FLUoxetine (PROZAC) 10 MG capsule; Take 1 capsule (10 mg total) by mouth daily.  Panic attacks  Major depressive disorder, recurrent episode, moderate (HCC) -     FLUoxetine (PROZAC) 20 MG capsule; Take 1 capsule (20 mg total) by mouth daily.  Mixed obsessional thoughts and acts -     FLUoxetine (PROZAC) 20 MG capsule; Take 1 capsule (20 mg total) by mouth daily.     Please see After Visit Summary for patient specific instructions.  Future Appointments  Date Time Provider Department Center  09/15/2020 10:00 AM Azizi Bally, , NP [facility] None    No orders of the defined types were placed in this encounter.   -------------------------------

## 2020-08-20 ENCOUNTER — Emergency Department (HOSPITAL_COMMUNITY)
Admission: EM | Admit: 2020-08-20 | Discharge: 2020-08-21 | Disposition: A | Discharge status: Other Acute Inpatient Hospital | Payer: 59 | Source: Home / Self Care | Attending: Emergency Medicine | Admitting: Emergency Medicine

## 2020-08-20 ENCOUNTER — Encounter (HOSPITAL_COMMUNITY): Payer: Self-pay

## 2020-08-20 DIAGNOSIS — R45851 Suicidal ideations: Secondary | ICD-10-CM | POA: Insufficient documentation

## 2020-08-20 DIAGNOSIS — Z79899 Other long term (current) drug therapy: Secondary | ICD-10-CM | POA: Insufficient documentation

## 2020-08-20 DIAGNOSIS — I10 Essential (primary) hypertension: Secondary | ICD-10-CM | POA: Insufficient documentation

## 2020-08-20 DIAGNOSIS — F17290 Nicotine dependence, other tobacco product, uncomplicated: Secondary | ICD-10-CM | POA: Insufficient documentation

## 2020-08-20 DIAGNOSIS — F419 Anxiety disorder, unspecified: Secondary | ICD-10-CM | POA: Insufficient documentation

## 2020-08-20 DIAGNOSIS — Z7984 Long term (current) use of oral hypoglycemic drugs: Secondary | ICD-10-CM | POA: Insufficient documentation

## 2020-08-20 DIAGNOSIS — Z20822 Contact with and (suspected) exposure to covid-19: Secondary | ICD-10-CM | POA: Insufficient documentation

## 2020-08-20 DIAGNOSIS — F329 Major depressive disorder, single episode, unspecified: Secondary | ICD-10-CM | POA: Insufficient documentation

## 2020-08-20 DIAGNOSIS — F332 Major depressive disorder, recurrent severe without psychotic features: Secondary | ICD-10-CM | POA: Insufficient documentation

## 2020-08-20 DIAGNOSIS — E119 Type 2 diabetes mellitus without complications: Secondary | ICD-10-CM | POA: Insufficient documentation

## 2020-08-20 LAB — COMPREHENSIVE METABOLIC PANEL
ALT: 34 U/L (ref 0–44)
AST: 35 U/L (ref 15–41)
Albumin: 3.8 g/dL (ref 3.5–5.0)
Alkaline Phosphatase: 52 U/L (ref 38–126)
Anion gap: 12 (ref 5–15)
BUN: 20 mg/dL (ref 6–20)
CO2: 25 mmol/L (ref 22–32)
Calcium: 9.3 mg/dL (ref 8.9–10.3)
Chloride: 101 mmol/L (ref 98–111)
Creatinine, Ser: 0.73 mg/dL (ref 0.44–1.00)
GFR, Estimated: >60 mL/min (ref >60)
Glucose, Bld: 99 mg/dL (ref 70–99)
Potassium: 3.7 mmol/L (ref 3.5–5.1)
Sodium: 138 mmol/L (ref 135–145)
Total Bilirubin: 0.3 mg/dL (ref 0.3–1.2)
Total Protein: 7.3 g/dL (ref 6.5–8.1)

## 2020-08-20 LAB — CBC WITH DIFFERENTIAL/PLATELET
Abs Immature Granulocytes: 0.02 10*3/uL (ref 0.00–0.07)
Basophils Absolute: 0 10*3/uL (ref 0.0–0.1)
Basophils Relative: 1 %
Eosinophils Absolute: 0.1 10*3/uL (ref 0.0–0.5)
Eosinophils Relative: 1 %
HCT: 38.7 % (ref 36.0–46.0)
Hemoglobin: 13.5 g/dL (ref 12.0–15.0)
Immature Granulocytes: 0 %
Lymphocytes Relative: 17 %
Lymphs Abs: 1.4 10*3/uL (ref 0.7–4.0)
MCH: 32.7 pg (ref 26.0–34.0)
MCHC: 34.9 g/dL (ref 30.0–36.0)
MCV: 93.7 fL (ref 80.0–100.0)
Monocytes Absolute: 0.6 10*3/uL (ref 0.1–1.0)
Monocytes Relative: 8 %
Neutro Abs: 6.1 10*3/uL (ref 1.7–7.7)
Neutrophils Relative %: 73 %
Platelets: 266 10*3/uL (ref 150–400)
RBC: 4.13 MIL/uL (ref 3.87–5.11)
RDW: 12.4 % (ref 11.5–15.5)
WBC: 8.3 10*3/uL (ref 4.0–10.5)
nRBC: 0 % (ref 0.0–0.2)

## 2020-08-20 LAB — ETHANOL: Alcohol, Ethyl (B): <10 mg/dL (ref <10)

## 2020-08-20 LAB — RAPID URINE DRUG SCREEN, HOSP PERFORMED
Amphetamines: NOT DETECTED
Barbiturates: NOT DETECTED
Benzodiazepines: POSITIVE — AB
Cocaine: NOT DETECTED
Opiates: NOT DETECTED
Tetrahydrocannabinol: POSITIVE — AB

## 2020-08-20 LAB — RESP PANEL BY RT-PCR (FLU A&B, COVID) ARPGX2
Influenza A by PCR: NEGATIVE
Influenza B by PCR: NEGATIVE
SARS Coronavirus 2 by RT PCR: NEGATIVE

## 2020-08-20 LAB — I-STAT BETA HCG BLOOD, ED (MC, WL, AP ONLY): I-stat hCG, quantitative: <5 m[IU]/mL (ref <5)

## 2020-08-20 LAB — SALICYLATE LEVEL: Salicylate Lvl: <7 mg/dL — ABNORMAL LOW (ref 7.0–30.0)

## 2020-08-20 LAB — CBG MONITORING, ED: Glucose-Capillary: 95 mg/dL (ref 70–99)

## 2020-08-20 LAB — ACETAMINOPHEN LEVEL: Acetaminophen (Tylenol), Serum: <10 ug/mL — ABNORMAL LOW (ref 10–30)

## 2020-08-20 MED ORDER — IBUPROFEN 200 MG PO TABS
600.0000 mg | ORAL_TABLET | Freq: Once | ORAL | Status: AC
  Administered 2020-08-20: 600 mg via ORAL
  Filled 2020-08-20: qty 3

## 2020-08-20 MED ORDER — LORAZEPAM 1 MG PO TABS: 0.0000 mg | ORAL_TABLET | Freq: Two times a day (BID) | ORAL | Status: DC

## 2020-08-20 MED ORDER — ALPRAZOLAM 0.5 MG PO TABS
1.0000 mg | ORAL_TABLET | Freq: Two times a day (BID) | ORAL | Status: DC | PRN
  Administered 2020-08-20: 1 mg via ORAL
  Filled 2020-08-20 (×2): qty 2

## 2020-08-20 MED ORDER — LORAZEPAM 1 MG PO TABS
0.0000 mg | ORAL_TABLET | Freq: Four times a day (QID) | ORAL | Status: DC
  Administered 2020-08-20: 1 mg via ORAL
  Administered 2020-08-20: 2 mg via ORAL
  Filled 2020-08-20: qty 2
  Filled 2020-08-20: qty 1

## 2020-08-20 MED ORDER — LORAZEPAM 2 MG/ML IJ SOLN: 0.0000 mg | Freq: Two times a day (BID) | INTRAMUSCULAR | Status: DC

## 2020-08-20 MED ORDER — FLUOXETINE HCL 20 MG PO CAPS
20.0000 mg | ORAL_CAPSULE | Freq: Every day | ORAL | Status: DC
  Administered 2020-08-21: 20 mg via ORAL
  Filled 2020-08-20: qty 1

## 2020-08-20 MED ORDER — THIAMINE HCL 100 MG/ML IJ SOLN: 100.0000 mg | Freq: Every day | INTRAMUSCULAR | Status: DC

## 2020-08-20 MED ORDER — ONDANSETRON 4 MG PO TBDP
4.0000 mg | ORAL_TABLET | Freq: Once | ORAL | Status: AC
  Administered 2020-08-20: 4 mg via ORAL
  Filled 2020-08-20: qty 1

## 2020-08-20 MED ORDER — HYDROCHLOROTHIAZIDE 25 MG PO TABS
25.0000 mg | ORAL_TABLET | Freq: Every day | ORAL | Status: DC
  Administered 2020-08-21: 25 mg via ORAL
  Filled 2020-08-20: qty 1

## 2020-08-20 MED ORDER — LISINOPRIL 20 MG PO TABS
20.0000 mg | ORAL_TABLET | Freq: Every day | ORAL | Status: DC
  Administered 2020-08-21: 20 mg via ORAL
  Filled 2020-08-20: qty 1

## 2020-08-20 MED ORDER — ONDANSETRON HCL 4 MG/2ML IJ SOLN: 4.0000 mg | Freq: Once | INTRAMUSCULAR | Status: DC

## 2020-08-20 MED ORDER — THIAMINE HCL 100 MG PO TABS
100.0000 mg | ORAL_TABLET | Freq: Every day | ORAL | Status: DC
  Administered 2020-08-20 – 2020-08-21 (×2): 100 mg via ORAL
  Filled 2020-08-20 (×2): qty 1

## 2020-08-20 MED ORDER — MELATONIN 3 MG PO TABS
3.0000 mg | ORAL_TABLET | Freq: Every evening | ORAL | Status: DC | PRN
  Administered 2020-08-20: 3 mg via ORAL
  Filled 2020-08-20: qty 1

## 2020-08-20 MED ORDER — LISINOPRIL 20 MG PO TABS: 20.0000 mg | ORAL_TABLET | Freq: Every day | ORAL | Status: DC

## 2020-08-20 MED ORDER — LORAZEPAM 2 MG/ML IJ SOLN
0.0000 mg | Freq: Four times a day (QID) | INTRAMUSCULAR | Status: DC
Start: 2020-08-20 — End: 2020-08-21
  Administered 2020-08-20 – 2020-08-21 (×2): 0 mg via INTRAVENOUS

## 2020-08-20 MED ORDER — FLUOXETINE HCL 10 MG PO CAPS
10.0000 mg | ORAL_CAPSULE | Freq: Every day | ORAL | Status: DC
  Administered 2020-08-21: 10 mg via ORAL
  Filled 2020-08-20: qty 1

## 2020-08-20 NOTE — ED Triage Notes (Signed)
Pt arrived via POV, c/o SI, with plan to shoot herself. No access to guns. Denies any HI. Denies any known trigger.

## 2020-08-20 NOTE — BH Assessment (Incomplete)
Comprehensive Clinical Assessment (CCA) Note  08/20/2020 Gwendolyn Carrillo [id number]  DISPOSITION:  Gave clinical report to Gwendolyn Gamblesarolyn Coleman, NP, who determined that Pt meets inpatient criteria.  [facility] advised.  The patient demonstrates the following risk factors for suicide: Chronic risk factors for suicide include: psychiatric disorder of GAD. Acute risk factors for suicide include: social withdrawal/isolation. Protective factors for this patient include: positive therapeutic relationship. Considering these factors, the overall suicide risk at this point appears to be low. Patient is not appropriate for outpatient follow up.  Flowsheet Row ED from 08/20/2020 in [facility]  C-SSRS RISK CATEGORY Low Risk     Chief Complaint:  Chief Complaint  Patient presents with  . Suicidal   Visit Diagnosis: Major Depressive Disorder, Recurrent, Severe w/o psychotic features; GAD  NARRATIVE:  Pt is a 45 year old female who [name] with complaint of ongoing suicidal ideation with plan to secure a firearm and shoot herself.  Pt reported that she has felt suicidal ''for a while.''  [place] in [name] her boyfriend, and she is employed.  She receives outpatient psychiatric treatment for GAD and depressive symptoms through [name].  Pt reported as follows:  She stated that she feels suicidal and is alarmed ''because I'm getting braver at the thought of killing myself.''  Pt endorsed the following symptoms:  Suicidal ideation; despondency; anxiety; tearfulness; isolation; irritability; poor sleep.  Pt reported that she smokes marijuana 3 times a week to try to ease emotional distress, and she also ingests ''several shots of vodka'' per night to calm down.  Pt denies having immediate access to a firearm.  Possible stressor is chronic body pain.  Pt denied homicidal ideation, hallucination, and self-injurious behavior.  Pt stated that she  did not feel safe going home.  Pt requested inpatient treatment.  During assessment, Pt presented as alert and oriented.  She had good eye contact and was cooperative.  Pt was appropriately groomed.  Pt's mood was depressed and anxious.  Affect was blunted.  Pt's speech was normal in rate, rhythm, and volume.  Thought processes were within normal range, and thought content was logical and goal-oriented. There was no evidence of delusion.  Memory and concentration were intact.  Insight, judgment, and impulse control were fair.  CCA Screening, Triage and Referral (STR)  Patient Reported Information How did you hear about Gwendolyn Carrillo? Self  Referral name: No data recorded Referral phone number: No data recorded  Whom do you see for routine medical problems? Primary Care  Practice/Facility Name: [name]  Practice/Facility Phone Number: No data recorded Name of Contact: No data recorded Contact Number: No data recorded Contact Fax Number: No data recorded Prescriber Name: No data recorded Prescriber Address (if known): No data recorded  What Is the Reason for Your Visit/Call Today? No data recorded How Long Has This Been Causing You Problems? 1-6 months  What Do You Feel Would Help You the Most Today? Treatment for Depression or other mood problem   Have You Recently Been in Any Inpatient Treatment (Hospital/Detox/Crisis Center/28-Day Program)? No  Name/Location of Program/Hospital:No data recorded How Long Were You There? No data recorded When Were You Discharged? No data recorded  Have You [facility] Received [facility] Before? Yes  Who [facility]? ED   Have You Recently Had Any Thoughts About Hurting Yourself? Yes  Are You Planning to Commit Suicide/Harm Yourself At This time? Yes  Have you Recently Had Thoughts About Hurting Someone Gwendolyn Carrillo? No  Explanation: No data recorded  Have You Used Any Alcohol or Drugs in the Past 24 Hours? Yes  How Long Ago  Did You Use Drugs or Alcohol? No data recorded What Did You Use and How Much? Marijuana, alcohol   Do You Currently Have a Therapist/Psychiatrist? Yes  Name of Therapist/Psychiatrist: [name] with [name]   Have You Been Recently Discharged From Any Office Practice or Programs? No  Explanation of Discharge From Practice/Program: No data recorded    CCA Screening Triage Referral Assessment Type of Contact: Tele-Assessment  Is this Initial or Reassessment? Initial Assessment  Date Telepsych consult ordered in [facility]:  08/20/2020  Time Telepsych consult ordered in [facility]:  No data recorded  Patient Reported Information Reviewed? Yes  Patient Left Without Being Seen? No data recorded Reason for Not Completing Assessment: No data recorded  Collateral Involvement: No data recorded  Does Patient Have a Court Appointed Legal Guardian? No data recorded Name and Contact of Legal Guardian: No data recorded If Minor and Not Living with Parent(s), Who has Custody? No data recorded Is CPS involved or ever been involved? Never  Is APS involved or ever been involved? Never   Patient Determined To Be At Risk for Harm To Self or Others Based on Review of Patient Reported Information or Presenting Complaint? No  Method: No data recorded Availability of Means: No data recorded Intent: No data recorded Notification Required: No data recorded Additional Information for Danger to Others Potential: No data recorded Additional Comments for Danger to Others Potential: No data recorded Are There Guns or Other Weapons in Your Home? No data recorded Types of Guns/Weapons: No data recorded Are These Weapons Safely Secured?                            No data recorded Who Could Verify You Are Able To Have These Secured: No data recorded Do You Have any Outstanding Charges, Pending Court Dates, Parole/Probation? No data recorded Contacted To Inform of Risk of Harm To Self or Others: No data  recorded  Location of Assessment: [name] Assessment Services   Does Patient Present under Involuntary Commitment? No  IVC Papers Initial File Date: No data recorded  [place] of Residence: [place]   Patient Currently Receiving the Following Services: Medication Management; Individual Therapy   Determination of Need: Emergent (2 hours)   Options For Referral: Inpatient Hospitalization     CCA Biopsychosocial Intake/Chief Complaint:  Pt endorsed ongoing suicidal ideation, despondency, and anxiety  Current Symptoms/Problems: Pt endorsed suicidal ideation with plan to secure a firearm and shoot herself   Patient Reported Schizophrenia/Schizoaffective Diagnosis in Past: No   Strengths: No data recorded Preferences: No data recorded Abilities: No data recorded  Type of Services Patient Feels are Needed: No data recorded  Initial Clinical Notes/Concerns: Pt endorsed suicidal ideation wtih plan to find gun and shoot herself.  She denied homicidal ideation, hallucination,self-injurious behavior   Mental Health Symptoms Depression:  Change in energy/activity; Fatigue; Hopelessness; Difficulty Concentrating; Sleep (too much or little); Tearfulness; Worthlessness   Duration of Depressive symptoms: Greater than two weeks   Mania:  N/A   Anxiety:   Restlessness; Tension; Worrying; Difficulty concentrating; Fatigue; Irritability   Psychosis:  None   Duration of Psychotic symptoms: No data recorded  Trauma:  N/A   Obsessions:  N/A   Compulsions:  N/A   Inattention:  N/A  Hyperactivity/Impulsivity:  N/A   Oppositional/Defiant Behaviors:  N/A   Emotional Irregularity:  N/A   Other Mood/Personality Symptoms:  No data recorded   Mental Status Exam Appearance and self-care  Stature:  Average   Weight:  Average weight   Clothing:  Casual   Grooming:  Normal   Cosmetic use:  Age appropriate   Posture/gait:  Normal   Motor activity:  Not Remarkable    Sensorium  Attention:  Normal   Concentration:  Normal   Orientation:  X5   Recall/memory:  Normal   Affect and Mood  Affect:  Blunted   Mood:  Depressed; Anxious   Relating  Eye contact:  Normal   Facial expression:  Depressed   Attitude toward examiner:  Cooperative   Thought and Language  Speech flow: Normal   Thought content:  Appropriate to Mood and Circumstances   Preoccupation:  None   Hallucinations:  None   Organization:  No data recorded  [employer] of Knowledge:  Average   Intelligence:  Average   Abstraction:  Functional   Judgement:  Fair   Reality Testing:  Adequate   Insight:  Fair   Decision Making:  Normal   Social Functioning  Social Maturity:  Isolates   Social Judgement:  Normal   Stress  Stressors:  Other (Comment) (Ongoing anxiety)   Coping Ability:  Overwhelmed   Skill Deficits:  None   Supports:  Friends/Service system     Religion:    Leisure/Recreation:    Exercise/Diet: Exercise/Diet Do You Have Any Trouble Sleeping?: Yes Explanation of Sleeping Difficulties: Significant insomnia   CCA Employment/Education Employment/Work Situation: Employment / Work Situation Employment situation: Employed  Education: Education Is Patient Currently Attending School?: No   CCA Family/Childhood History Family and Relationship History:    Childhood History:     Child/Adolescent Assessment:     CCA Substance Use Alcohol/Drug Use: Alcohol / Drug Use Pain Medications: Please see MAR Prescriptions: Please see MAR Over the Counter: Please see MAR History of alcohol / drug use?: Yes Substance #1 Name of Substance 1: Alcohol 1 - Amount (size/oz): a few shots 1 - Frequency: Daily 1 - Duration: Ongoing 1 - Last Use / Amount: 08/19/2020 1 - Method of Aquiring: Purchase 1- Route of Use: Ingestion Substance #2 Name of Substance 2: Marijuana 2 - Amount (size/oz): ''a few hits'' 2 - Frequency:  Weekly 2 - Duration: Ongoing 2 - Last Use / Amount: 08/19/2020                     ASAM's:  Six Dimensions of Multidimensional Assessment  Dimension 1:  Acute Intoxication and/or Withdrawal Potential:      Dimension 2:  Biomedical Conditions and Complications:      Dimension 3:  Emotional, Behavioral, or Cognitive Conditions and Complications:     Dimension 4:  Readiness to Change:     Dimension 5:  Relapse, Continued use, or Continued Problem Potential:     Dimension 6:  Recovery/Living Environment:     ASAM Severity Score:    ASAM Recommended Level of Treatment:     Substance use Disorder (SUD)    Recommendations for Services/Supports/Treatments:    DSM5 Diagnoses: Patient Active Problem List   Diagnosis Date Noted  . Severe episode of recurrent major depressive disorder, without psychotic features (HCC)   . S/P appendectomy 05/19/2014    Patient Centered Plan: Patient is on the following Treatment Plan(s):     Referrals  to Alternative Service(s): Referred to Alternative Service(s):   Place:   Date:   Time:    Referred to Alternative Service(s):   Place:   Date:   Time:    Referred to Alternative Service(s):   Place:   Date:   Time:    Referred to Alternative Service(s):   Place:   Date:   Time:     Earline Mayotte, [facility]

## 2020-08-20 NOTE — ED Provider Notes (Signed)
[facility] Provider Note   CSN: [id number] Arrival date & time: 08/20/20  8469     History Chief Complaint  Patient presents with  . Suicidal    Gwendolyn Carrillo is a 45 y.o. female.  Gwendolyn Carrillo is a 45 y.o. female with a history of hypertension, diabetes, gastroparesis, depression and anxiety, presents to the ED voluntarily for evaluation of depression with suicidal ideations.  Patient reports that after being extremely sick for a year with diabetes and gastroparesis she had worsening of her depression and anxiety.  She started seeking treatment for this last fall, but reports over the last few weeks her depression has been worsening.  She has been having increasing thoughts of suicide.  Now endorses continued more persistent thoughts of suicide with plan to gain access to a gun and shoot herself.  Patient reports that she just does not want to deal with living anymore, she wants to die, and she wants to go quickly.  She reports she sees a Veterinary surgeon and psychiatrist, and takes Prozac which she has been taking regularly.  She reports that her chronic illnesses and just the stress of life in general have become more difficult to manage.  She reports when she gets overwhelmed like this and cries a lot she often has a headache and nausea which she is experiencing today but denies vomiting or abdominal pain.  No chest pain or shortness of breath.  Headache feels like her typical headaches and she has no associated neck pain, stiffness, fever, vision changes, numbness or weakness.  No other medical complaints.  She does report that to help deal with this anxiety and depression she has been drinking more frequently, and reports that she has been taking a few shots of vodka nightly recently to help her relax.  Reports occasional marijuana use but otherwise denies any other drug use.  Reports she has not been checking her sugars regularly, is not insulin-dependent.   No other aggravating or alleviating factors.        Past Medical History:  Diagnosis Date  . Anxiety   . Diabetes mellitus without complication (HCC)   . Hypertension     Patient Active Problem List   Diagnosis Date Noted  . S/P appendectomy 05/19/2014    Past Surgical History:  Procedure Laterality Date  . APPENDECTOMY    . CESAREAN SECTION    . ENDOMETRIAL ABLATION       OB History   No obstetric history on file.     History reviewed. No pertinent family history.  Social History   Tobacco Use  . Smoking status: Current Every Day Smoker    Types: Cigars  . Smokeless tobacco: Never Used  Vaping Use  . Vaping Use: Never used  Substance Use Topics  . Alcohol use: Yes    Comment: occ  . Drug use: No    Home Medications Prior to Admission medications   Medication Sig Start Date End Date Taking? Authorizing Provider  acetaminophen (TYLENOL) 500 MG tablet Take 1,000 mg by mouth every 6 (six) hours as needed. For pain.    [provider]  ALPRAZolam Prudy Feeler) 1 MG tablet Take 1 tablet (1 mg total) by mouth 2 (two) times daily as needed for anxiety. 06/16/20   Mozingo, Thereasa Solo, NP  atorvastatin (LIPITOR) 40 MG tablet Take 40 mg by mouth daily. 05/08/19   [provider]  fluconazole (DIFLUCAN) 150 MG tablet Take 150 mg by mouth daily. 05/08/19  [provider]  FLUoxetine (PROZAC) 10 MG capsule Take 1 capsule (10 mg total) by mouth daily. 06/16/20   Mozingo, Thereasa Solo, NP  FLUoxetine (PROZAC) 20 MG capsule Take 1 capsule (20 mg total) by mouth daily. 06/16/20   Mozingo, Thereasa Solo, NP  glipiZIDE (GLUCOTROL XL) 5 MG 24 hr tablet Take 1 tablet (5 mg total) by mouth daily with breakfast. 07/04/18   Sabas Sous, MD  hydrochlorothiazide (HYDRODIURIL) 25 MG tablet Take 25 mg by mouth daily. 07/19/20   [provider]  hydrochlorothiazide (HYDRODIURIL) 50 MG tablet Take 50 mg by mouth daily.    [provider]   ibuprofen (ADVIL,MOTRIN) 200 MG tablet Take 400 mg by mouth as needed for moderate pain.    [provider]  LINZESS 145 MCG CAPS capsule Take 145 mcg by mouth daily. 08/06/20   [provider]  lisinopril (PRINIVIL,ZESTRIL) 20 MG tablet Take 20 mg by mouth daily.    [provider]  metFORMIN (GLUCOPHAGE-XR) 500 MG 24 hr tablet Take 500 mg by mouth daily. 01/22/19   [provider]  metoprolol succinate (TOPROL-XL) 50 MG 24 hr tablet Take 1 tablet (50 mg total) by mouth daily. Take with or immediately following a meal. Patient not taking: Reported on 07/04/2018 12/04/12   Donita Brooks, MD  Omega-3 Fatty Acids (FISH OIL) 1000 MG CAPS Take 2,000 mg by mouth daily.    [provider]  omeprazole (PRILOSEC) 40 MG capsule Take 40 mg by mouth daily.    [provider]  ondansetron (ZOFRAN) 4 MG tablet Take 1 tablet (4 mg total) by mouth every 8 (eight) hours as needed for up to 20 doses for nausea or vomiting. 10/10/19   Curatolo, Adam, DO  oseltamivir (TAMIFLU) 75 MG capsule Take 1 capsule (75 mg total) by mouth every 12 (twelve) hours. 07/04/18   Sabas Sous, MD  OZEMPIC, 0.25 OR 0.5 MG/DOSE, 2 MG/1.5ML SOPN Inject 0.5 mg into the skin once a week. 06/27/20   [provider]  OZEMPIC, 1 MG/DOSE, 2 MG/1.5ML SOPN SMARTSIG:1 Pre-Filled Pen Syringe SUB-Q Once a Week 06/02/19   [provider]  vitamin C (ASCORBIC ACID) 500 MG tablet Take 1,000 mg by mouth daily.    [provider]  Vitamin D, Ergocalciferol, (DRISDOL) 1.25 MG (50000 UNIT) CAPS capsule Take 50,000 Units by mouth once a week. 06/12/19   [provider]    Allergies    Patient has no known allergies.  Review of Systems   Review of Systems  Constitutional: Negative for chills and fever.  HENT: Negative.   Respiratory: Negative for cough and shortness of breath.   Cardiovascular: Negative for chest pain.  Gastrointestinal: Positive for nausea.  Negative for abdominal pain, diarrhea and vomiting.  Genitourinary: Negative for dysuria.  Musculoskeletal: Negative for arthralgias, myalgias, neck pain and neck stiffness.  Skin: Negative for color change and rash.  Neurological: Positive for headaches.  Psychiatric/Behavioral: Positive for dysphoric mood and suicidal ideas. Negative for self-injury. The patient is nervous/anxious.     Physical Exam Updated Vital Signs BP (!) 169/104 (BP Location: Left Arm)   Pulse 88   Temp 98.7 F (37.1 C) (Oral)   Resp 16   SpO2 92%   Physical Exam Vitals and nursing note reviewed.  Constitutional:      General: She is not in acute distress.    Appearance: Normal appearance. She is well-developed. She is not diaphoretic.  HENT:  Head: Normocephalic and atraumatic.     Mouth/Throat:     Mouth: Mucous membranes are moist.     Pharynx: Oropharynx is clear.  Eyes:     General:        Right eye: No discharge.        Left eye: No discharge.  Cardiovascular:     Rate and Rhythm: Normal rate and regular rhythm.     Heart sounds: Normal heart sounds. No murmur heard. No friction rub. No gallop.   Pulmonary:     Effort: Pulmonary effort is normal. No respiratory distress.     Breath sounds: Normal breath sounds. No wheezing or rales.     Comments: Respirations equal and unlabored, patient able to speak in full sentences, lungs clear to auscultation bilaterally  Abdominal:     General: Bowel sounds are normal. There is no distension.     Palpations: Abdomen is soft. There is no mass.     Tenderness: There is no abdominal tenderness. There is no guarding.     Comments: Abdomen soft, nondistended, nontender to palpation in all quadrants without guarding or peritoneal signs   Musculoskeletal:        General: No deformity.     Cervical back: Neck supple.  Skin:    General: Skin is warm and dry.     Capillary Refill: Capillary refill takes less than 2 seconds.  Neurological:     Mental  Status: She is alert and oriented to person, place, and time.     Coordination: Coordination normal.     Comments: Speech is clear, able to follow commands Moves extremities without ataxia, coordination intact  Psychiatric:        Attention and Perception: She does not perceive auditory or visual hallucinations.        Mood and Affect: Mood is anxious and depressed. Affect is tearful.        Speech: She is noncommunicative.        Behavior: Behavior normal. Behavior is cooperative.        Thought Content: Thought content includes suicidal ideation. Thought content does not include homicidal ideation. Thought content includes suicidal plan.     ED Results / Procedures / Treatments   Labs (all labs ordered are listed, but only abnormal results are displayed) Labs Reviewed  SALICYLATE LEVEL - Abnormal; Notable for the following components:      Result Value   Salicylate Lvl <7.0 (*)    All other components within normal limits  ACETAMINOPHEN LEVEL - Abnormal; Notable for the following components:   Acetaminophen (Tylenol), Serum <10 (*)    All other components within normal limits  RESP PANEL BY RT-PCR (FLU A&B, COVID) ARPGX2  COMPREHENSIVE METABOLIC PANEL  ETHANOL  CBC WITH DIFFERENTIAL/PLATELET  RAPID URINE DRUG SCREEN, HOSP PERFORMED  I-STAT BETA HCG BLOOD, ED ([facility], [facility], [facility] ONLY)    EKG EKG Interpretation  Date/Time:  Wednesday August 20 2020 10:27:54 EDT Ventricular Rate:  76 PR Interval:  174 QRS Duration: 82 QT Interval:  408 QTC Calculation: 459 R Axis:   87 Text Interpretation: Normal sinus rhythm Normal ECG No acute changes No significant change since last tracing Confirmed by Derwood KaplanNanavati, Ankit ([id number] on 08/20/2020 10:31:45 AM   Radiology No results found.  Procedures Procedures   Medications Ordered in ED Medications  LORazepam (ATIVAN) injection 0-4 mg (has no administration in time range)    Or  LORazepam (ATIVAN) tablet 0-4 mg (has no administration in time  range)  LORazepam (ATIVAN) injection 0-4 mg (has no administration in time range)    Or  LORazepam (ATIVAN) tablet 0-4 mg (has no administration in time range)  thiamine tablet 100 mg (100 mg Oral Given 08/20/20 1138)    Or  thiamine (B-1) injection 100 mg ( Intravenous See Alternative 08/20/20 1138)  ibuprofen (ADVIL) tablet 600 mg (600 mg Oral Given 08/20/20 1139)  ondansetron (ZOFRAN-ODT) disintegrating tablet 4 mg (4 mg Oral Given 08/20/20 1139)    ED Course  I have reviewed the triage vital signs and the nursing notes.  Pertinent labs & imaging results that were available during my care of the patient were reviewed by me and considered in my medical decision making (see chart for details).    MDM Rules/Calculators/A&P                           45 year old female presents with suicidal ideations and plan to shoot herself, does not have immediate access to weapons.  Worsening depression and anxiety in the setting of chronic illness.  No focal medical complaints today aside from nausea and headache which she reports she gets routinely when she gets very worked up and overwhelmed and has been crying which she reports today.  The symptoms resolved with Zofran and ibuprofen given in the ED.  Patient here voluntarily, will get medical clearance labs and consult TTS for evaluation.  I have independently ordered, reviewed and interpreted all labs: CBC: No leukocytosis, normal hemoglobin CMP: No electrolyte derangements, normal renal and liver function Ethanol, acetaminophen and salicylate levels negative. Pregnancy: Negative UDS: positive for THC and benzos  Patient is medically cleared.  Awaiting TTS evaluation and recommendations for disposition.  The patient has been placed in psychiatric observation due to the need to provide a safe environment for the patient while obtaining psychiatric consultation and evaluation, as well as ongoing medical and medication management to treat the  patient's condition.  The patient has not been placed under full IVC at this time.   Final Clinical Impression(s) / ED Diagnoses Final diagnoses:  Suicidal ideations    Rx / DC Orders ED Discharge Orders    None       Legrand Rams 08/20/20 1247    Derwood Kaplan, MD 08/21/20 2226

## 2020-08-20 NOTE — ED Notes (Signed)
Pt belongings: one shirt(G), one pants(Blk), earring(1set), chain(1), no cell phone, state emp debit card [id number], capital one  [id number], visa [id number], chime visa [phone]. Bag at nurse desk PT place in hall B

## 2020-08-20 NOTE — BH Assessment (Addendum)
Disposition: Pt meets inpt criteria per Vernard Gambles. Disposition Counselor notified [facility] (Tosin, RN) of patient's bed needs.

## 2020-08-21 ENCOUNTER — Other Ambulatory Visit: Payer: Self-pay

## 2020-08-21 ENCOUNTER — Inpatient Hospital Stay (HOSPITAL_COMMUNITY)
Admission: AD | Admit: 2020-08-21 | Discharge: 2020-08-25 | DRG: 885 | Disposition: A | Discharge status: Home / Self Care | Payer: 59 | Source: Intra-hospital | Attending: Psychiatry | Admitting: Psychiatry

## 2020-08-21 ENCOUNTER — Encounter (HOSPITAL_COMMUNITY): Payer: Self-pay | Admitting: Psychiatry

## 2020-08-21 DIAGNOSIS — I10 Essential (primary) hypertension: Secondary | ICD-10-CM | POA: Diagnosis present

## 2020-08-21 DIAGNOSIS — E559 Vitamin D deficiency, unspecified: Secondary | ICD-10-CM | POA: Diagnosis present

## 2020-08-21 DIAGNOSIS — F329 Major depressive disorder, single episode, unspecified: Secondary | ICD-10-CM | POA: Diagnosis present

## 2020-08-21 DIAGNOSIS — K3184 Gastroparesis: Secondary | ICD-10-CM | POA: Diagnosis present

## 2020-08-21 DIAGNOSIS — F17290 Nicotine dependence, other tobacco product, uncomplicated: Secondary | ICD-10-CM | POA: Diagnosis present

## 2020-08-21 DIAGNOSIS — R45851 Suicidal ideations: Secondary | ICD-10-CM | POA: Diagnosis present

## 2020-08-21 DIAGNOSIS — F332 Major depressive disorder, recurrent severe without psychotic features: Secondary | ICD-10-CM | POA: Diagnosis present

## 2020-08-21 DIAGNOSIS — K219 Gastro-esophageal reflux disease without esophagitis: Secondary | ICD-10-CM | POA: Diagnosis present

## 2020-08-21 DIAGNOSIS — F411 Generalized anxiety disorder: Secondary | ICD-10-CM | POA: Diagnosis present

## 2020-08-21 DIAGNOSIS — Z20822 Contact with and (suspected) exposure to covid-19: Secondary | ICD-10-CM | POA: Diagnosis present

## 2020-08-21 DIAGNOSIS — E119 Type 2 diabetes mellitus without complications: Secondary | ICD-10-CM

## 2020-08-21 MED ORDER — GABAPENTIN 100 MG PO CAPS
100.0000 mg | ORAL_CAPSULE | Freq: Three times a day (TID) | ORAL | Status: DC
  Administered 2020-08-21 – 2020-08-25 (×11): 100 mg via ORAL
  Filled 2020-08-21 (×18): qty 1

## 2020-08-21 MED ORDER — ACETAMINOPHEN 325 MG PO TABS
650.0000 mg | ORAL_TABLET | Freq: Four times a day (QID) | ORAL | Status: DC | PRN
  Administered 2020-08-23 – 2020-08-24 (×2): 650 mg via ORAL
  Filled 2020-08-21 (×2): qty 2

## 2020-08-21 MED ORDER — THIAMINE HCL 100 MG PO TABS
100.0000 mg | ORAL_TABLET | Freq: Every day | ORAL | Status: DC
  Administered 2020-08-22 – 2020-08-25 (×4): 100 mg via ORAL
  Filled 2020-08-21 (×7): qty 1

## 2020-08-21 MED ORDER — FLUOXETINE HCL 20 MG PO CAPS
40.0000 mg | ORAL_CAPSULE | Freq: Every day | ORAL | Status: DC
  Filled 2020-08-21 (×2): qty 2

## 2020-08-21 MED ORDER — HYDROXYZINE HCL 25 MG PO TABS: 25.0000 mg | ORAL_TABLET | Freq: Three times a day (TID) | ORAL | Status: DC | PRN

## 2020-08-21 MED ORDER — LISINOPRIL 20 MG PO TABS
20.0000 mg | ORAL_TABLET | Freq: Every day | ORAL | Status: DC
  Administered 2020-08-22 – 2020-08-25 (×4): 20 mg via ORAL
  Filled 2020-08-21 (×7): qty 1

## 2020-08-21 MED ORDER — ALUM & MAG HYDROXIDE-SIMETH 200-200-20 MG/5ML PO SUSP: 30.0000 mL | ORAL | Status: DC | PRN

## 2020-08-21 MED ORDER — LINACLOTIDE 145 MCG PO CAPS
145.0000 ug | ORAL_CAPSULE | Freq: Every day | ORAL | Status: DC
  Administered 2020-08-25: 145 ug via ORAL
  Filled 2020-08-21 (×7): qty 1

## 2020-08-21 MED ORDER — LORAZEPAM 1 MG PO TABS: 1.0000 mg | ORAL_TABLET | Freq: Four times a day (QID) | ORAL | Status: DC | PRN

## 2020-08-21 MED ORDER — HYDROCHLOROTHIAZIDE 50 MG PO TABS
50.0000 mg | ORAL_TABLET | Freq: Every day | ORAL | Status: DC
  Administered 2020-08-22 – 2020-08-25 (×4): 50 mg via ORAL
  Filled 2020-08-21 (×6): qty 1

## 2020-08-21 MED ORDER — ONDANSETRON HCL 4 MG PO TABS
4.0000 mg | ORAL_TABLET | Freq: Three times a day (TID) | ORAL | Status: DC | PRN
  Administered 2020-08-24 – 2020-08-25 (×2): 4 mg via ORAL
  Filled 2020-08-21 (×2): qty 1

## 2020-08-21 MED ORDER — VITAMIN D (ERGOCALCIFEROL) 1.25 MG (50000 UNIT) PO CAPS
50000.0000 [IU] | ORAL_CAPSULE | ORAL | Status: DC
  Administered 2020-08-25: 50000 [IU] via ORAL
  Filled 2020-08-21: qty 1

## 2020-08-21 MED ORDER — MAGNESIUM HYDROXIDE 400 MG/5ML PO SUSP: 30.0000 mL | Freq: Every day | ORAL | Status: DC | PRN

## 2020-08-21 MED ORDER — PANTOPRAZOLE SODIUM 40 MG PO TBEC
40.0000 mg | DELAYED_RELEASE_TABLET | Freq: Every day | ORAL | Status: DC
  Administered 2020-08-21 – 2020-08-25 (×5): 40 mg via ORAL
  Filled 2020-08-21 (×8): qty 1

## 2020-08-21 MED ORDER — HYDROCHLOROTHIAZIDE 25 MG PO TABS
25.0000 mg | ORAL_TABLET | Freq: Every day | ORAL | Status: DC
  Filled 2020-08-21 (×2): qty 1

## 2020-08-21 MED ORDER — LOPERAMIDE HCL 2 MG PO CAPS: 2.0000 mg | ORAL_CAPSULE | ORAL | Status: AC | PRN

## 2020-08-21 MED ORDER — TRAZODONE HCL 50 MG PO TABS
50.0000 mg | ORAL_TABLET | Freq: Every evening | ORAL | Status: DC | PRN
  Administered 2020-08-21 – 2020-08-24 (×4): 50 mg via ORAL
  Filled 2020-08-21 (×4): qty 1

## 2020-08-21 MED ORDER — NICOTINE 14 MG/24HR TD PT24
14.0000 mg | MEDICATED_PATCH | Freq: Every day | TRANSDERMAL | Status: DC
  Administered 2020-08-22: 14 mg via TRANSDERMAL
  Filled 2020-08-21 (×6): qty 1

## 2020-08-21 MED ORDER — ADULT MULTIVITAMIN W/MINERALS CH
1.0000 | ORAL_TABLET | Freq: Every day | ORAL | Status: DC
  Administered 2020-08-21 – 2020-08-24 (×4): 1 via ORAL
  Filled 2020-08-21 (×8): qty 1

## 2020-08-21 MED ORDER — HYDROXYZINE HCL 50 MG PO TABS
50.0000 mg | ORAL_TABLET | ORAL | Status: DC | PRN
  Administered 2020-08-21: 50 mg via ORAL
  Filled 2020-08-21: qty 1

## 2020-08-21 MED ORDER — METOPROLOL SUCCINATE ER 50 MG PO TB24
50.0000 mg | ORAL_TABLET | Freq: Every day | ORAL | Status: DC
  Filled 2020-08-21 (×2): qty 1
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
## 2020-08-21 NOTE — ED Provider Notes (Signed)
admited to [facility].   Virgina Norfolk, DO 08/21/20 [phone]

## 2020-08-21 NOTE — [facility] Suicide Risk Assessment (Signed)
[facility] Admission Suicide Risk Assessment   Nursing information obtained from:  Family Demographic factors:  Divorced, has children Current Mental Status:  Self-harm thoughts Loss Factors:  Physical health conditions Historical Factors:  Family history of mental illness or substance abuse,Impulsivity (My 43 y/o son has Bipolar & Schizophrenia"); alcohol and THC use prior to admission; previous psychiatric diagnoses  Risk Reduction Factors:  Sense of responsibility to family,Employed,Living with another person, especially a relative,Positive social support,Positive therapeutic relationship,Positive coping skills or problem solving skills  Total Time Spent in Direct Patient Care:  I personally spent 60 minutes on the unit in direct patient care. The direct patient care time included face-to-face time with the patient, reviewing the patient's chart, communicating with other professionals, and coordinating care. Greater than 50% of this time was spent in counseling or coordinating care with the patient regarding goals of hospitalization, psycho-education, and discharge planning needs.  Principal Problem: MDD (major depressive disorder), recurrent severe, without psychosis (HCC) Diagnosis:  Principal Problem:   MDD (major depressive disorder), recurrent severe, without psychosis (HCC) Active Problems:   GAD (generalized anxiety disorder)  Subjective Data: Patient is a 45y/o female with h/o MDD and GAD, who was admitted voluntarily from [facility] for management of worsening depression with SI. The patient states she has suffered with depressive episodes off and on since childhood and has been in her current severe depressive episode for over a month. She states it has been an effort to shower, to comb her hair, to go to work, or to talk to people, and she has found herself more isolative recently. She reports associated depressive symptoms including worsening anhedonia, poor sleep (4-6 hours nightly), low  appetite, poor focus, low energy, and belief that she is a "failure as a person." She states that she was started on Prozac 10mg  last November and the dose was gradually titrated up to 30mg  over a month ago. She can tell that of all the antidepressants she has previously tried that Prozac has worked better for her, but she is not finding symptom relief at her present dose. She denies previously having mood stabilizer or antipsychotic medications as augmentation to her SSRI. She states that she mentioned to her boyfriend that she was having thoughts of getting a gun and killing herself at which time he insisted that she seek assessment at the ED. She states she has never tried to commit suicide in the past and denies previous psychiatric inpatient admissions. She previously had psychotherapy through [facility] but states she feels she needs a new provider. She is seeing a mental health provider through [name] (Dr. December).   She reports that in addition to depression she has been diagnosed with GAD. She states she has intermittent panic attacks and physical symptoms associated with her anxiety. She is prescribed Xanax 1mg  bid but states that she usually takes 0.5mg  once a day if she is anxious at work. To help self-medicate for her anxiety and depression, she states she has also been using 3-4 shots of alcohol nightly or every other night since January. She denies withdrawal when she is not drinking and does not drink during the day. She denies cravings for alcohol or previous rehab/detox. 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 She denies h/o AVH, paranoia, mania/hypomania, PTSD symptoms, or HI.   Continued Clinical Symptoms:  Alcohol Use Disorder Identification Test Final Score (AUDIT): 6 The "Alcohol Use Disorders Identification Test", Guidelines for Use in Primary Care,  Second Edition.  World Science writer [facility]). Score between 0-7:  no or  low risk or alcohol related problems. Score between 8-15:  moderate risk of alcohol related problems. Score between 16-19:  high risk of alcohol related problems. Score 20 or above:  warrants further diagnostic evaluation for alcohol dependence and treatment.  CLINICAL FACTORS:   Panic Attacks Depression:   Anhedonia Hopelessness Insomnia Severe Alcohol/Substance Abuse/Dependencies More than one psychiatric diagnosis Previous Psychiatric Diagnoses and Treatments   Musculoskeletal: Strength & Muscle Tone: within normal limits Gait & Station: normal, steady Patient leans: N/A  Psychiatric Specialty Exam: Physical Exam Vitals reviewed.  HENT:     Head: Normocephalic.     Mouth/Throat:     Mouth: Mucous membranes are moist.     Pharynx: Oropharynx is clear.  Eyes:     Extraocular Movements: Extraocular movements intact.  Cardiovascular:     Rate and Rhythm: Normal rate and regular rhythm.  Pulmonary:     Effort: Pulmonary effort is normal.     Breath sounds: Normal breath sounds.  Musculoskeletal:        General: Normal range of motion.  Skin:    General: Skin is warm and dry.  Neurological:     Mental Status: She is alert.     Comments: CN 3-12 grossly intact, intact finger to nose, equal grip     Review of Systems  Constitutional: Negative for fever.  HENT: Negative.   Respiratory: Negative for shortness of breath.   Cardiovascular: Negative for chest pain.  Gastrointestinal: Negative for abdominal pain, diarrhea, nausea and vomiting.  Genitourinary: Negative.   Musculoskeletal: Negative.   Skin: Negative for rash.  Neurological: Negative for headaches.    Blood pressure 118/74, pulse 75, temperature 98.9 F (37.2 C), temperature source Oral, resp. rate 20, height 5\' 4"  (1.626 m), weight 78.9 kg, SpO2 100 %.Body mass index is 29.87 kg/m.  General Appearance: casually dressed in scrubs, fair hygiene, appears stated age  Eye Contact:  Good  Speech:  Clear and  Coherent and Normal Rate  Volume:  Normal  Mood:  Depressed  Affect:  Constricted  Thought Process:  Goal Directed and Linear  Orientation:  Full (Time, Place, and Person)  Thought Content:  Logical and Denies AVH, paranoia; no acute psychosis or delusions on exam; no obsessions/compulsions on exam  Suicidal Thoughts:  SI with thought to shoot self  Homicidal Thoughts:  No  Memory:  Recent;   Good  Judgement:  Fair  Insight:  Fair  Psychomotor Activity:  Normal  Concentration:  Concentration: Good and Attention Span: Good  Recall:  Good  Fund of Knowledge:  Good  Language:  Good  Akathisia:  Negative  Assets:  Communication Skills Desire for Improvement Housing Resilience Social Support Vocational/Educational  ADL's:  Intact  Cognition:  WNL   COGNITIVE FEATURES THAT CONTRIBUTE TO RISK:  None    SUICIDE RISK:   Moderate:  Frequent suicidal ideation with limited intensity, and duration, some specificity in terms of plans, no associated intent, good self-control, limited dysphoria/symptomatology, some risk factors present, and identifiable protective factors, including available and accessible social support.  PLAN OF CARE: See admission H&P  I certify that inpatient services furnished can reasonably be expected to improve the patient's condition.   , MD, FAPA 08/21/2020, 4:47 PM

## 2020-08-21 NOTE — Tx Team (Signed)
Initial Treatment Plan 08/21/2020 7:16 PM Gwendolyn Carrillo [id number]    PATIENT STRESSORS: Financial difficulties Medication change or noncompliance Occupational concerns Substance abuse   PATIENT STRENGTHS: Capable of independent living Financial means Supportive family/friends Work skills   PATIENT IDENTIFIED PROBLEMS: Alterations in mood (anxiety & depression) "My anxiety has been worse lately, I can't function well".    Substance Abuse "I drink about 3 shots of vodka to sleep and I smoke weed as well".    Financial constraints "I don't know if I still have my job at [employer]. I just started there in January".             DISCHARGE CRITERIA:  Improved stabilization in mood, thinking, and/or behavior Verbal commitment to aftercare and medication compliance  PRELIMINARY DISCHARGE PLAN: Outpatient therapy Return to previous living arrangement Return to previous work or school arrangements  PATIENT/FAMILY INVOLVEMENT: This treatment plan has been presented to and reviewed with the patient, Gwendolyn Carrillo.  The patient have been given the opportunity to ask questions and make suggestions.  Sherryl Manges, RN 08/21/2020, 7:16 PM

## 2020-08-21 NOTE — BH Assessment (Addendum)
Patient accepted to [facility] (adult unit) by Vernard Gambles. The attending provider is Dr. Jola Babinski. [facility] will be ready to assume care for patient after 9am. Room # 402-02. Nurse report [phone]. Please have patient sign the voluntary admission form prior to transfer. Also, nursing to arrange transfer.   Disposition updates provided to charge nurse Jerilee Hoh), patient's nurse Toni Amend, RN), and EDP (Dr. Candice Camp).
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
## 2020-08-21 NOTE — ED Notes (Signed)
Transport called to bring pt to [facility].

## 2020-08-21 NOTE — ED Notes (Signed)
Attempted report to [facility] without success. RN unavailable. Awaiting call back. Pt tearful, signed voluntary form with this RN at bedside.

## 2020-08-21 NOTE — Progress Notes (Signed)
[facility] Group Notes:  (Nursing/MHT/Case Management/Adjunct)  Date:  08/21/2020  Time:  2015 Type of Therapy:  wrap up group  Participation Level:  Active  Participation Quality:  Appropriate, Attentive, Sharing and Supportive  Affect:  Depressed  Cognitive:  Alert  Insight:  Improving  Engagement in Group:  Engaged  Modes of Intervention:  Clarification, Education and Support  Summary of Progress/Problems: Positive thinking and positive change were discussed.   Johann Capers S 08/21/2020, 9:22 PM

## 2020-08-21 NOTE — Progress Notes (Signed)
Pt's mother Robbi Garter [phone]), called with a concern about pt restarting Prozac. "She seemed to get worse when they increased her dosage a few months ago. I'm not a doctor or anything but I do have a Masters in Psychology. I know my daughter's depression got worse when they increased her Prozac a few months ago." Mother asked for name of provider here and the best time to call tomorrow.

## 2020-08-21 NOTE — Progress Notes (Addendum)
   08/21/20 2138  Psych Admission Type (Psych Patients Only)  Admission Status Voluntary  Psychosocial Assessment  Patient Complaints Anxiety;Depression  Eye Contact Fair  Facial Expression Sad;Worried  Affect Appropriate to circumstance  Speech Logical/coherent;Soft  Interaction Assertive  Motor Activity Slow  Appearance/Hygiene In scrubs  Behavior Characteristics Cooperative;Anxious  Mood Anxious;Depressed;Pleasant  Thought Process  Coherency WDL  Content WDL  Delusions None reported or observed  Perception WDL  Hallucination None reported or observed  Judgment Poor  Confusion None  Danger to Self  Current suicidal ideation? Denies  Danger to Others  Danger to Others None reported or observed   Pt denies SI, HI, AVH and pain. Pt rates anxiety 4/10 and depression 4/10. Pt states that Prozac has been the only medication helping her. "I know that my mother and husband believe that it is bad for me. They've been reading about it online." Pt says that the increased dosage of 40 mg is okay for her. "I feel like everyone is trying to manage my life even my children. I'm not living; I'm just existing."  Pt says her youngest is 43 years old. "I seem to take care of everyone else." Pt advised to use her time at [facility] to decide how she will handle her mental health and her life once she is discharged.

## 2020-08-22 LAB — LIPID PANEL
Cholesterol: 211 mg/dL — ABNORMAL HIGH (ref 0–200)
HDL: 50 mg/dL (ref >40)
LDL Cholesterol: 128 mg/dL — ABNORMAL HIGH (ref 0–99)
Total CHOL/HDL Ratio: 4.2 RATIO
Triglycerides: 163 mg/dL — ABNORMAL HIGH (ref <150)
VLDL: 33 mg/dL (ref 0–40)

## 2020-08-22 LAB — HEMOGLOBIN A1C
Hgb A1c MFr Bld: 5.9 % — ABNORMAL HIGH (ref 4.8–5.6)
Mean Plasma Glucose: 122.63 mg/dL

## 2020-08-22 LAB — TSH: TSH: 1.937 u[IU]/mL (ref 0.350–4.500)

## 2020-08-22 MED ORDER — DULOXETINE HCL 30 MG PO CPEP
30.0000 mg | ORAL_CAPSULE | Freq: Every day | ORAL | Status: DC
  Administered 2020-08-22 – 2020-08-23 (×2): 30 mg via ORAL
  Filled 2020-08-22 (×5): qty 1

## 2020-08-22 MED ORDER — HYDROXYZINE HCL 25 MG PO TABS
25.0000 mg | ORAL_TABLET | Freq: Three times a day (TID) | ORAL | Status: DC | PRN
  Administered 2020-08-22: 25 mg via ORAL
  Filled 2020-08-22: qty 1

## 2020-08-22 MED ORDER — FLUOXETINE HCL 20 MG PO CAPS
20.0000 mg | ORAL_CAPSULE | Freq: Every day | ORAL | Status: AC
  Administered 2020-08-22: 20 mg via ORAL
  Filled 2020-08-22: qty 1

## 2020-08-22 NOTE — Plan of Care (Signed)
Discussed with patient in presence of nursing the fact that she signed her 72 hour request for discharge last night. I advised that given the severity of her depression with SI on admission and the fact that we are cross-tapering medications that I would advise that she reconsider and rescind the form and stay for continued treatment and avoid potential IVC. She agrees after discussion to remain voluntary and rescind the form. Nursing to assist with this process.   Bartholomew Crews, MD, Celene Skeen

## 2020-08-22 NOTE — Progress Notes (Signed)
[facility]  08/22/2020 6:47 AM Gwendolyn Carrillo  MRN:  [id number]   Chief Complaint: worsening depression and SI  Subjective:  Gwendolyn Echevariaakisha J Streng is a 45 y.o. female with a history of MDD and GAD, who was initially admitted for inpatient psychiatric hospitalization on 08/21/2020 for management of worsening depression with SI. The patient is currently on Hospital Day 1.   Chart Review from last 24 hours:  The patient's chart was reviewed and nursing notes were reviewed. The patient's case was discussed in multidisciplinary team meeting.Per nursing, patient's mother and boyfriend called with concerns that she has only been suicidal since starting Prozac. No safety concerns or behavioral issues noted. Per MAR she was compliant with scheduled medications with exception of refusal of Linzess. She did receive Vistaril PRN X1 for anxiety and Trazodone X1 for sleep  Information Obtained Today During Patient Interview: The patient was seen and evaluated on the unit. On assessment today the patient reports that she slept well overnight and has a good appetite. We discussed the concerns expressed by her mother and boyfriend that she has only had SI since starting Prozac, and she admits that this may be the case when she thinks about the medication in retrospect. She was willing to consider transition to a different antidepressant after discussion today. After medication options were discussed, she consents to trial of Cymbalta. She denies AVH, paranoia, or delusions. She denies SI or HI. She admits she has not showered but she is waiting on her boyfriend to bring in her special soap and a change of clothes today. She states she attended group last night. She admits she still feels depressed but she is feeling less anxious with start of Neurontin. She states she had mild sensation of heart racing in the context of anxiety yesterday which has resolved and voices no other physical complaints. She does not  appear to have acute signs of alcohol withdrawal based on CIWA scores or clinical exam.  Principal Problem: MDD (major depressive disorder), recurrent severe, without psychosis (HCC) Diagnosis: Principal Problem:   MDD (major depressive disorder), recurrent severe, without psychosis (HCC) Active Problems:   GAD (generalized anxiety disorder)   Gastroparesis   Type II diabetes mellitus (HCC)   Hypertension   Vitamin D deficiency  Total Time Spent in Direct Patient Care:  I personally spent 30 minutes on the unit in direct patient care. The direct patient care time included face-to-face time with the patient, reviewing the patient's chart, communicating with other professionals, and coordinating care. Greater than 50% of this time was spent in counseling or coordinating care with the patient regarding goals of hospitalization, psycho-education, and discharge planning needs.  Past Psychiatric History: see admission H&P  Past Medical History:  Gastroparesis Type II DM - not on medications presently (previously on Ozempic)  HTN Vitamin D deficiency  Past Surgical History:  Procedure Laterality Date  . APPENDECTOMY    . CESAREAN SECTION    . ENDOMETRIAL ABLATION    Umbilical hernia repair Oral surgery  Family History: see admission H&P  Family Psychiatric  History: see admission H&P  Social History:  Social History   Substance and Sexual Activity  Alcohol Use Yes   Comment: occ     Social History   Substance and Sexual Activity  Drug Use Yes  . Types: Marijuana   Comment: "not very often"    Social History   Socioeconomic History  . Marital status: Married    Spouse name: Not on  file  . Number of children: Not on file  . Years of education: Not on file  . Highest education level: Not on file  Occupational History  . Not on file  Tobacco Use  . Smoking status: Current Every Day Smoker    Types: Cigars  . Smokeless tobacco: Never Used  . Tobacco comment: "I  smoke when I'm drinking"  Vaping Use  . Vaping Use: Never used  Substance and Sexual Activity  . Alcohol use: Yes    Comment: occ  . Drug use: Yes    Types: Marijuana    Comment: "not very often"  . Sexual activity: Not Currently  Other Topics Concern  . Not on file  Social History Narrative  . Not on file   Social Determinants of Health   Financial Resource Strain: Not on file  Food Insecurity: Not on file  Transportation Needs: Not on file  Physical Activity: Not on file  Stress: Not on file  Social Connections: Not on file   Sleep: Good  Appetite:  Good  Current Medications: Current Facility-Administered Medications  Medication Dose Route Frequency Provider Last Rate Last Admin  . acetaminophen (TYLENOL) tablet 650 mg  650 mg Oral Q6H PRN White, Patrice L, NP      . alum & mag hydroxide-simeth (MAALOX/MYLANTA) 200-200-20 MG/5ML suspension 30 mL  30 mL Oral Q4H PRN White, Patrice L, NP      . FLUoxetine (PROZAC) capsule 40 mg  40 mg Oral Daily Rey Fors E, MD      . gabapentin (NEURONTIN) capsule 100 mg  100 mg Oral TID Comer Locket, MD   100 mg at 08/21/20 1812  . hydrochlorothiazide (HYDRODIURIL) tablet 50 mg  50 mg Oral Daily Mason Jim, Riyad Keena E, MD      . linaclotide (LINZESS) capsule 145 mcg  145 mcg Oral QAC breakfast Mason Jim, Arihaan Bellucci E, MD      . lisinopril (ZESTRIL) tablet 20 mg  20 mg Oral Daily Nwoko, Agnes I, NP      . loperamide (IMODIUM) capsule 2-4 mg  2-4 mg Oral PRN Mason Jim, Noraa Pickeral E, MD      . LORazepam (ATIVAN) tablet 1 mg  1 mg Oral Q6H PRN Mason Jim, Alicha Raspberry E, MD      . magnesium hydroxide (MILK OF MAGNESIA) suspension 30 mL  30 mL Oral Daily PRN White, Patrice L, NP      . multivitamin with minerals tablet 1 tablet  1 tablet Oral Daily Comer Locket, MD   1 tablet at 08/21/20 2132  . nicotine (NICODERM CQ - dosed in mg/24 hours) patch 14 mg  14 mg Transdermal Daily Nwoko, Agnes I, NP      . ondansetron (ZOFRAN) tablet 4 mg  4 mg Oral Q8H PRN  Mason Jim, Aubrionna Istre E, MD      . pantoprazole (PROTONIX) EC tablet 40 mg  40 mg Oral Daily Mason Jim, Archer Moist E, MD   40 mg at 08/21/20 1812  . thiamine tablet 100 mg  100 mg Oral Daily Tyann Niehaus E, MD      . traZODone (DESYREL) tablet 50 mg  50 mg Oral QHS PRN Comer Locket, MD   50 mg at 08/21/20 2132  . [START ON 08/25/2020] Vitamin D (Ergocalciferol) (DRISDOL) capsule 50,000 Units  50,000 Units Oral Q7 days Comer Locket, MD        Lab Results:  Results for orders placed or performed during the hospital encounter of 08/20/20 (from the past 48  hour(s))  Urine rapid drug screen (hosp performed)     Status: Abnormal   Collection Time: 08/20/20 10:02 AM  Result Value Ref Range   Opiates NONE DETECTED NONE DETECTED   Cocaine NONE DETECTED NONE DETECTED   Benzodiazepines POSITIVE (A) NONE DETECTED   Amphetamines NONE DETECTED NONE DETECTED   Tetrahydrocannabinol POSITIVE (A) NONE DETECTED   Barbiturates NONE DETECTED NONE DETECTED    Comment: (NOTE) DRUG SCREEN FOR MEDICAL PURPOSES ONLY.  IF CONFIRMATION IS NEEDED FOR ANY PURPOSE, NOTIFY LAB WITHIN 5 DAYS.  LOWEST DETECTABLE LIMITS FOR URINE DRUG SCREEN Drug Class                     Cutoff (ng/mL) Amphetamine and metabolites    1000 Barbiturate and metabolites    200 Benzodiazepine                 200 Tricyclics and metabolites     300 Opiates and metabolites        300 Cocaine and metabolites        300 THC                            50 Performed at [facility], [address], [place], [place] [address]   Comprehensive metabolic panel     Status: None   Collection Time: 08/20/20 10:37 AM  Result Value Ref Range   Sodium 138 135 - 145 mmol/L   Potassium 3.7 3.5 - 5.1 mmol/L   Chloride 101 98 - 111 mmol/L   CO2 25 22 - 32 mmol/L   Glucose, Bld 99 70 - 99 mg/dL    Comment: Glucose reference range applies only to samples taken after fasting for at least 8 hours.   BUN 20 6 - 20 mg/dL   Creatinine, Ser  1.47 0.44 - 1.00 mg/dL   Calcium 9.3 8.9 - 82.9 mg/dL   Total Protein 7.3 6.5 - 8.1 g/dL   Albumin 3.8 3.5 - 5.0 g/dL   AST 35 15 - 41 U/L   ALT 34 0 - 44 U/L   Alkaline Phosphatase 52 38 - 126 U/L   Total Bilirubin 0.3 0.3 - 1.2 mg/dL   GFR, Estimated >56 >21 mL/min    Comment: (NOTE) Calculated using the CKD-EPI Creatinine Equation (2021)    Anion gap 12 5 - 15    Comment: Performed at [facility], [address] [place], [place] [address]  Ethanol     Status: None   Collection Time: 08/20/20 10:37 AM  Result Value Ref Range   Alcohol, Ethyl (B) <10 <10 mg/dL    Comment: (NOTE) Lowest detectable limit for serum alcohol is 10 mg/dL.  For medical purposes only. Performed at [facility], [address], [place], [place] [address]   CBC with Diff     Status: None   Collection Time: 08/20/20 10:37 AM  Result Value Ref Range   WBC 8.3 4.0 - 10.5 K/uL   RBC 4.13 3.87 - 5.11 MIL/uL   Hemoglobin 13.5 12.0 - 15.0 g/dL   HCT 62.9 52.8 - 41.3 %   MCV 93.7 80.0 - 100.0 fL   MCH 32.7 26.0 - 34.0 pg   MCHC 34.9 30.0 - 36.0 g/dL   RDW 24.4 01.0 - 27.2 %   Platelets 266 150 - 400 K/uL   nRBC 0.0 0.0 - 0.2 %   Neutrophils Relative %  73 %   Neutro Abs 6.1 1.7 - 7.7 K/uL   Lymphocytes Relative 17 %   Lymphs Abs 1.4 0.7 - 4.0 K/uL   Monocytes Relative 8 %   Monocytes Absolute 0.6 0.1 - 1.0 K/uL   Eosinophils Relative 1 %   Eosinophils Absolute 0.1 0.0 - 0.5 K/uL   Basophils Relative 1 %   Basophils Absolute 0.0 0.0 - 0.1 K/uL   Immature Granulocytes 0 %   Abs Immature Granulocytes 0.02 0.00 - 0.07 K/uL    Comment: Performed at [facility], [address], [place], [place] [address]  Salicylate level     Status: Abnormal   Collection Time: 08/20/20 10:37 AM  Result Value Ref Range   Salicylate Lvl <7.0 (L) 7.0 - 30.0 mg/dL    Comment: Performed at [facility], [address], [place], [place] [address]   Acetaminophen level     Status: Abnormal   Collection Time: 08/20/20 10:37 AM  Result Value Ref Range   Acetaminophen (Tylenol), Serum <10 (L) 10 - 30 ug/mL    Comment: (NOTE) Therapeutic concentrations vary significantly. A range of 10-30 ug/mL  may be an effective concentration for many patients. However, some  are best treated at concentrations outside of this range. Acetaminophen concentrations >150 ug/mL at 4 hours after ingestion  and >50 ug/mL at 12 hours after ingestion are often associated with  toxic reactions.  Performed at [facility], [address], [place], [place] [address]   I-Stat beta hCG blood, ED     Status: None   Collection Time: 08/20/20 10:43 AM  Result Value Ref Range   I-stat hCG, quantitative <5.0 <5 mIU/mL   Comment 3            Comment:   GEST. AGE      CONC.  (mIU/mL)   <=1 WEEK        5 - 50     2 WEEKS       50 - 500     3 WEEKS       100 - 10,000     4 WEEKS     1,000 - 30,000        FEMALE AND NON-PREGNANT FEMALE:     LESS THAN 5 mIU/mL   CBG monitoring, ED     Status: None   Collection Time: 08/20/20 12:10 PM  Result Value Ref Range   Glucose-Capillary 95 70 - 99 mg/dL    Comment: Glucose reference range applies only to samples taken after fasting for at least 8 hours.  Resp Panel by RT-PCR (Flu A&B, Covid) Nasopharyngeal Swab     Status: None   Collection Time: 08/20/20 12:13 PM   Specimen: Nasopharyngeal Swab; Nasopharyngeal(NP) swabs in vial transport medium  Result Value Ref Range   SARS Coronavirus 2 by RT PCR NEGATIVE NEGATIVE    Comment: (NOTE) SARS-CoV-2 target nucleic acids are NOT DETECTED.  The SARS-CoV-2 RNA is generally detectable in upper respiratory specimens during the acute phase of infection. The lowest concentration of SARS-CoV-2 viral copies this assay can detect is 138 copies/mL. A negative result does not preclude SARS-Cov-2 infection and should not be used as the sole basis for treatment  or other patient management decisions. A negative result may occur with  improper specimen collection/handling, submission of specimen other than nasopharyngeal swab, presence of viral mutation(s) within the areas targeted by this assay, and inadequate number of viral copies(<138 copies/mL). A negative  result must be combined with clinical observations, patient history, and epidemiological information. The expected result is Negative.  Fact Sheet for Patients:  BloggerCourse.com  Fact Sheet for Healthcare Providers:  SeriousBroker.it  This test is no t yet approved or cleared by the [place] FDA and  has been authorized for detection and/or diagnosis of SARS-CoV-2 by FDA under an Emergency Use Authorization (EUA). This EUA will remain  in effect (meaning this test can be used) for the duration of the COVID-19 declaration under Section 564(b)(1) of the Act, 21 U.S.C.section 360bbb-3(b)(1), unless the authorization is terminated  or revoked sooner.       Influenza A by PCR NEGATIVE NEGATIVE   Influenza B by PCR NEGATIVE NEGATIVE    Comment: (NOTE) The Xpert Xpress SARS-CoV-2/FLU/RSV plus assay is intended as an aid in the diagnosis of influenza from Nasopharyngeal swab specimens and should not be used as a sole basis for treatment. Nasal washings and aspirates are unacceptable for Xpert Xpress SARS-CoV-2/FLU/RSV testing.  Fact Sheet for Patients: BloggerCourse.com  Fact Sheet for Healthcare Providers: SeriousBroker.it  This test is not yet approved or cleared by the [place] FDA and has been authorized for detection and/or diagnosis of SARS-CoV-2 by FDA under an Emergency Use Authorization (EUA). This EUA will remain in effect (meaning this test can be used) for the duration of the COVID-19 declaration under Section 564(b)(1) of the Act, 21 U.S.C. section  360bbb-3(b)(1), unless the authorization is terminated or revoked.  Performed at [facility], [address] [place], [place] [address]     Blood Alcohol level:  Lab Results  Component Value Date   ETH <10 08/20/2020    Metabolic Disorder Labs: No results found for: HGBA1C, MPG No results found for: PROLACTIN No results found for: CHOL, TRIG, HDL, CHOLHDL, VLDL, LDLCALC  Physical Findings: AIMS: Facial and Oral Movements Muscles of Facial Expression: None, normal Lips and Perioral Area: None, normal Jaw: None, normal Tongue: None, normal,Extremity Movements Upper (arms, wrists, hands, fingers): None, normal Lower (legs, knees, ankles, toes): None, normal, Trunk Movements Neck, shoulders, hips: None, normal, Overall Severity Severity of abnormal movements (highest score from questions above): None, normal Incapacitation due to abnormal movements: None, normal Patient's awareness of abnormal movements (rate only patient's report): No Awareness, Dental Status Current problems with teeth and/or dentures?: No Does patient usually wear dentures?: No  CIWA:  CIWA-Ar Total: 0     Musculoskeletal: Strength & Muscle Tone: within normal limits Gait & Station: normal, steady Patient leans: N/A  Psychiatric Specialty Exam: Physical Exam Vitals reviewed.  HENT:     Head: Normocephalic.  Pulmonary:     Effort: Pulmonary effort is normal.  Neurological:     Mental Status: She is alert.     Review of Systems  Respiratory: Negative for shortness of breath.   Cardiovascular: Negative for chest pain.  Gastrointestinal: Negative for diarrhea, nausea and vomiting.    Blood pressure 104/82, pulse 70, temperature (!) 97.5 F (36.4 C), temperature source Oral, resp. rate 20, height 5\' 4"  (1.626 m), weight 78.9 kg, SpO2 100 %.Body mass index is 29.87 kg/m.  General Appearance: casually dressed in scrubs, fair hygiene  Eye Contact:  Good  Speech:  Clear and  Coherent and Normal Rate  Volume:  Normal  Mood:  Depressed  Affect:  Constricted  Thought Process:  Goal Directed and Linear  Orientation:  Full (Time, Place, and Person)  Thought Content:  Logical and Denies AVH, paranoia, or delusions; no  acute psychosis on exam  Suicidal Thoughts:  No  Homicidal Thoughts:  No  Memory:  Recent;   Good  Judgement:  Fair  Insight:  Fair  Psychomotor Activity:  Normal  Concentration:  Concentration: Good and Attention Span: Good  Recall:  Good  Fund of Knowledge:  Good  Language:  Good  Akathisia:  Negative  Assets:  Communication Skills Desire for Improvement Housing Social Support  ADL's:  Intact  Cognition:  WNL  Sleep:  Number of Hours: 6.5   Treatment Plan Summary: Diagnoses / Active Problems: MDD recurrent severe without psychotic features GAD by hx Gastroparesis Type II DM HTN R/o alcohol use d/o  PLAN: 1. Safety and Monitoring:             -- Voluntary admission to inpatient psychiatric unit for safety, stabilization and treatment             -- Daily contact with patient to assess and evaluate symptoms and progress in treatment             -- Patient's case to be discussed in multi-disciplinary team meeting             -- Observation Level : q15 minute checks             -- Vital signs:  q12 hours             -- Precautions: suicide  2. Psychiatric Diagnoses and Treatment:              MDD recurrent severe without psychotic features             GAD by hx             -- Reduce Prozac to  today and then  tomorrow then stop. Start Cymbalta  daily today with plans to titrate up as tolerated (r/b/se/a to Cymbalta discussed and she consents to med trial - discussed transition to SNRI class of medication which she states she has not been on)             -- Continue Neurontin  tid to help with anxiety, potential withdrawal from alcohol, and sleep              -- Refused PRN Vistaril due to past side-effects with  medication; Holding Xanax at this time in the context of start of CIWA protocol and recent alcohol/THC use             -- Trazodone  po qhs PRN insomnia             -- Would benefit from psychotherapy referral after discharge  -- TSH pending             -- Encouraged patient to participate in unit milieu and in scheduled group therapies              -- Short Term Goals: Ability to identify changes in lifestyle to reduce recurrence of condition will improve, Ability to demonstrate self-control will improve and Ability to identify and develop effective coping behaviors will improve             -- Long Term Goals: Improvement in symptoms so as ready for discharge              R/o alcohol use d/o             -- Continue CIWA monitoring for withdrawal with Ativan  for CIWA >10; oral thiamine and MVI replacement  --  CIWA scores: 0,0             -- Counseled on the need to abstain from alcohol after discharge             -- Short Term Goals: Ability to identify triggers associated with substance abuse/mental health issues will improve             -- Long Term Goals: Improvement in symptoms so as ready for discharge              3. Medical Issues Being Addressed:              HTN             -- Continue home Lisinopril  daily and HCTZ  daily               Type II DM             -- Currently off medication             -- HbgA1c pending              Gastroparesis             -- Restart home Linzess daily              -- Protonix  daily              Vitamin D Deficiency             -- Continue home Vit D 50,000IU weekly on Mondays              Admission labs reviewed: TSH, HbgA1c, and lipid panel pending; UDS positive for benzodiazepines and THC; CMP WNL; ETOH <10; WBC 8.3, H/H 13.5/38.7 and platelets 266; Salicylate <7; tylenol <10; hcg negative; respiratory panel negative; EKG shows NSR 76bpm with QTc  4. Discharge Planning:              -- Social work  and case management to assist with discharge planning and identification of hospital follow-up needs prior to discharge             -- Estimated LOS: 3-4 days             -- Discharge Concerns: Need to establish a safety plan; Medication compliance and effectiveness             -- Discharge Goals: Return home with outpatient referrals for mental health follow-up including medication management/psychotherapy  I certify that inpatient services furnished can reasonably be expected to improve the patient's condition.    Comer Locket, MD, FAPA 08/22/2020, 6:47 AM

## 2020-08-22 NOTE — Progress Notes (Signed)
Pt remains guarded, sad with fair eye contact, depressed mood and affect. Cautious, minimal but forwards and is pleasant on interactions. Observed in hallway and dayroom with peers at brief intervals this shift. Noted crying on phone this afternoon "I just feel like the world is on my shoulders. 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 I was crying because when my man and my mother told me not to worry about bills, they are paid, now I feel like I can focus on me, on my health". Denies SI, HI, AVH and pain at this time. Verbally contracts for safety. Resended her 72 hours discharge request in agreement to focus on her mental health "To get my depression under control" she's also in agreement changes made in her medication regimen to achieve this goal. Tolerates scheduled medications and meals well without issues.  Emotional support and encouaragement offered. Q 15 minutes safety checks maintained without self harm gestures / outburst. All medications given as ordered without verbal education.  Pt remains safe on and off unit. Denies concerns at this time.

## 2020-08-22 NOTE — Progress Notes (Signed)
Recreation Therapy Notes  Date:  4.29.22 Time: 0935 Location: [address] Dayroom  Group Topic: Stress Management  Goal Area(s) Addresses:  Patient will identify positive stress management techniques. Patient will identify benefits of using stress management post d/c.  Intervention: Stress Management  Activity:  Meditation.  LRT played a meditation that focused on calming anxiety.  Patients were to listen as the meditation played and focus on their breathing, releasing any tension and what they are feeling.    Education:  Stress Management, Discharge Planning.   Education Outcome: Acknowledges Education  Clinical Observations/Feedback: Pt did not attend group session.    Caroll Rancher, LRT/CTRS         Caroll Rancher A 08/22/2020 11:26 AM

## 2020-08-22 NOTE — Tx Team (Signed)
Interdisciplinary Treatment and Diagnostic Plan Update  08/22/2020 Time of Session:  Gwendolyn Carrillo MRN: [id number]  Principal Diagnosis: MDD (major depressive disorder), recurrent severe, without psychosis ([place])  Secondary Diagnoses: Principal Problem:   MDD (major depressive disorder), recurrent severe, without psychosis ([place]) Active Problems:   GAD (generalized anxiety disorder)   Gastroparesis   Type II diabetes mellitus ([place])   Hypertension   Vitamin D deficiency   Current Medications:  Current Facility-Administered Medications  Medication Dose Route Frequency Provider Last Rate Last Admin  . acetaminophen (TYLENOL) tablet 650 mg  650 mg Oral Q6H PRN White, Patrice L, NP      . alum & mag hydroxide-simeth (MAALOX/MYLANTA) 200-200-20 MG/5ML suspension 30 mL  30 mL Oral Q4H PRN White, Patrice L, NP      . DULoxetine (CYMBALTA) DR capsule 30 mg  30 mg Oral Daily Viann Fish E, MD   30 mg at 08/22/20 0810  . gabapentin (NEURONTIN) capsule 100 mg  100 mg Oral TID Viann Fish E, MD   100 mg at 08/22/20 1248  . hydrochlorothiazide (HYDRODIURIL) tablet 50 mg  50 mg Oral Daily Harlow Asa, MD   50 mg at 08/22/20 0807  . linaclotide (LINZESS) capsule 145 mcg  145 mcg Oral QAC breakfast Nelda Marseille, Amy E, MD      . lisinopril (ZESTRIL) tablet 20 mg  20 mg Oral Daily Lindell Spar I, NP   20 mg at 08/22/20 0808  . loperamide (IMODIUM) capsule 2-4 mg  2-4 mg Oral PRN Nelda Marseille, Amy E, MD      . LORazepam (ATIVAN) tablet 1 mg  1 mg Oral Q6H PRN Nelda Marseille, Amy E, MD      . magnesium hydroxide (MILK OF MAGNESIA) suspension 30 mL  30 mL Oral Daily PRN White, Patrice L, NP      . multivitamin with minerals tablet 1 tablet  1 tablet Oral Daily Harlow Asa, MD   1 tablet at 08/22/20 [phone]  . nicotine (NICODERM CQ - dosed in mg/24 hours) patch 14 mg  14 mg Transdermal Daily Lindell Spar I, NP   14 mg at 08/22/20 0807  . ondansetron (ZOFRAN) tablet 4 mg  4 mg Oral Q8H PRN Nelda Marseille,  Amy E, MD      . pantoprazole (PROTONIX) EC tablet 40 mg  40 mg Oral Daily Harlow Asa, MD   40 mg at 08/22/20 7846  . thiamine tablet 100 mg  100 mg Oral Daily Nelda Marseille, Amy E, MD   100 mg at 08/22/20 0810  . traZODone (DESYREL) tablet 50 mg  50 mg Oral QHS PRN Harlow Asa, MD   50 mg at 08/21/20 2132  . [START ON 08/25/2020] Vitamin D (Ergocalciferol) (DRISDOL) capsule 50,000 Units  50,000 Units Oral Q7 days Harlow Asa, MD       PTA Medications: Medications Prior to Admission  Medication Sig Dispense Refill Last Dose  . ALPRAZolam (XANAX) 1 MG tablet Take 1 tablet (1 mg total) by mouth 2 (two) times daily as needed for anxiety. 60 tablet 2   . Aspirin-Acetaminophen-Caffeine (GOODY HEADACHE PO) Take 1 Package by mouth 2 (two) times daily as needed (headache/pain).     Marland Kitchen FLUoxetine (PROZAC) 10 MG capsule Take 1 capsule (10 mg total) by mouth daily. (Patient taking differently: Take 10 mg by mouth daily. Take with 40m to equal 342m 30 capsule 5   . FLUoxetine (PROZAC) 20 MG capsule Take 1 capsule (20 mg total) by mouth daily. (  Patient taking differently: Take 20 mg by mouth daily. Take with 51m to equal 364m 30 capsule 5   . glipiZIDE (GLUCOTROL XL) 5 MG 24 hr tablet Take 1 tablet (5 mg total) by mouth daily with breakfast. (Patient not taking: Reported on 08/20/2020) 30 tablet 1   . hydrochlorothiazide (HYDRODIURIL) 25 MG tablet Take 25 mg by mouth daily.     . Marland KitchenINZESS 145 MCG CAPS capsule Take 145 mcg by mouth daily as needed (constipation).     . Marland Kitchenisinopril (PRINIVIL,ZESTRIL) 20 MG tablet Take 20 mg by mouth daily.     . metoprolol succinate (TOPROL-XL) 50 MG 24 hr tablet Take 1 tablet (50 mg total) by mouth daily. Take with or immediately following a meal. 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   Physician Treatment Plan for Primary Diagnosis: MDD (major depressive disorder), recurrent severe, without psychosis ([place] Improvement in symptoms so as ready for discharge Improvement in symptoms so as ready for discharge   Short Term Goals: Ability to identify changes in lifestyle to reduce recurrence of condition will improve Ability to demonstrate self-control will improve Ability to identify and develop effective coping behaviors will improve Ability to identify triggers associated with substance abuse/mental health issues will improve  Medication Management: Evaluate patient's response, side effects, and tolerance of medication regimen.  Therapeutic Interventions: 1 to 1 sessions, Unit Group sessions and Medication administration.  Evaluation of Outcomes: Not Met  Physician Treatment Plan for Secondary Diagnosis: Principal Problem:   MDD (major depressive disorder), recurrent severe, without psychosis ([place] Problems:   GAD (generalized anxiety disorder)   Gastroparesis   Type II diabetes mellitus ([place]  Hypertension   Vitamin D deficiency  Long Term Goal(s): Improvement in symptoms so as ready for discharge Improvement in symptoms so as ready for discharge   Short Term Goals: Ability to identify changes in lifestyle to reduce recurrence of condition will improve Ability to demonstrate self-control will improve Ability to identify and develop effective coping behaviors will improve Ability to identify triggers associated with substance abuse/mental health issues will improve     Medication Management: Evaluate patient's response, side effects, and tolerance of medication regimen.  Therapeutic Interventions: 1 to 1 sessions, Unit Group  sessions and Medication administration.  Evaluation of Outcomes: Not Met   RN Treatment Plan for Primary Diagnosis: MDD (major depressive disorder), recurrent severe, without psychosis ([place] Term Goal(s): Knowledge of disease and therapeutic regimen to maintain health will improve  Short Term Goals: Ability to participate in decision making will improve, Ability to verbalize feelings will improve and Ability to disclose and discuss suicidal ideas  Medication Management: RN will administer medications as ordered by provider, will assess and evaluate patient's response and provide education to patient for prescribed medication. RN will report any adverse and/or side effects to prescribing provider.  Therapeutic Interventions: 1 on 1 counseling sessions, Psychoeducation, Medication administration, Evaluate responses to treatment, Monitor vital signs and CBGs as ordered, Perform/monitor CIWA, COWS, AIMS and Fall Risk screenings as ordered, Perform wound care treatments as ordered.  Evaluation of Outcomes: Not Met   LCSW Treatment Plan for Primary Diagnosis: MDD (major depressive disorder), recurrent severe, without psychosis (  [place]) Long Term Goal(s): Safe transition to appropriate next level of care at discharge, Engage patient in therapeutic group addressing interpersonal concerns.  Short Term Goals: Engage patient in aftercare planning with referrals and resources, Increase social support and Increase ability to appropriately verbalize feelings  Therapeutic Interventions: Assess for all discharge needs, 1 to 1 time with Social worker, Explore available resources and support systems, Assess for adequacy in community support network, Educate family and significant other(s) on suicide prevention, Complete Psychosocial Assessment, Interpersonal group therapy.  Evaluation of Outcomes: Not Met   Progress in Treatment: Attending groups: No. Participating in groups: No. Taking medication as  prescribed: Yes. Toleration medication: Yes. Family/Significant other contact made: No, will contact:  fiancee Patient understands diagnosis: Yes. Discussing patient identified problems/goals with staff: Yes. Medical problems stabilized or resolved: Yes. Denies suicidal/homicidal ideation: Yes. Issues/concerns per patient self-inventory: No. Other: None  New problem(s) identified: No, Describe:  None  New Short Term/Long Term Goal(s):medication stabilization, elimination of SI thoughts, development of comprehensive mental wellness plan.  Patient Goals:  "feel better and find balance."  Discharge Plan or Barriers: Patient recently admitted. CSW will continue to follow and assess for appropriate referrals and possible discharge planning.  Reason for Continuation of Hospitalization: Anxiety Depression Medication stabilization Suicidal ideation  Estimated Length of Stay: 3-5 days  Attendees: Patient: Gwendolyn Carrillo 08/22/2020   Physician:  08/22/2020   Nursing:  08/22/2020   RN Care Manager: 08/22/2020   Social Worker: Toney Reil, Latanya Presser 08/22/2020   Recreational Therapist:  08/22/2020   Other:  08/22/2020   Other:  08/22/2020   Other: 08/22/2020     Scribe for Treatment Team: Mliss Fritz, Latanya Presser 08/22/2020 2:01 PM
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
## 2020-08-22 NOTE — [facility] Group Notes (Signed)
Type of Therapy and Topic: Group Therapy: Anger Management   Participation: Active  Description of Group: In this group, patients will learn helpful strategies and techniques to manage anger, express anger in alternative ways, change hostile attitudes, and prevent aggressive acts, such as verbal abuse and violence.This group will be process-oriented and eductional, with patients participating in exploration of their own experiences as well as giving and receiving support and challenge from other group members.  Therapeutic Goals: 1. Patient will learn to manage anger. 2. Patient will learn to stop violence or the threat of violence. 3. Patient will learn to develop self control over thoughts and actions. 4. Patient will receive support and feedback from others  CSW provided worksheet packets for group members and answered any questions that were asked during this time.   Therapeutic Modalities: Cognitive Behavioral Therapy Solution Focused Therapy Motivational Interviewing 

## 2020-08-22 NOTE — Progress Notes (Signed)
   08/22/20 2115  COVID-19 Daily Checkoff  Have you had a fever (temp > 37.80C/100F)  in the past 24 hours?  No  If you have had runny nose, nasal congestion, sneezing in the past 24 hours, has it worsened? No  COVID-19 EXPOSURE  Have you traveled outside the state in the past 14 days? No  Have you been in contact with someone with a confirmed diagnosis of COVID-19 or PUI in the past 14 days without wearing appropriate PPE? No  Have you been living in the same home as a person with confirmed diagnosis of COVID-19 or a PUI (household contact)? No  Have you been diagnosed with COVID-19? No

## 2020-08-22 NOTE — Progress Notes (Signed)
   08/22/20 2115  Psych Admission Type (Psych Patients Only)  Admission Status Voluntary  Psychosocial Assessment  Patient Complaints Anxiety  Eye Contact Fair  Facial Expression Sad  Affect Appropriate to circumstance  Speech Logical/coherent;Soft  Interaction Assertive  Motor Activity Slow  Appearance/Hygiene Improved  Behavior Characteristics Cooperative;Anxious  Mood Anxious;Pleasant  Thought Process  Coherency WDL  Content WDL  Delusions None reported or observed  Perception WDL  Hallucination None reported or observed  Judgment Poor  Confusion None  Danger to Self  Current suicidal ideation? Denies  Danger to Others  Danger to Others None reported or observed

## 2020-08-22 NOTE — Progress Notes (Signed)
This Clinical research associate just received call from pt's husband, Maretta Bees [phone]) about pt's medications. Pt wanted to make it known that his wife did not become suicidal until she was prescribed Prozac by her doctor in October 2021. "I've been with this woman for 4 years and I have never heard her speak about killing herself. Since she has been on Prozac, she wakes up at least a couple times a week and says 'I want to die today.' Then she went back to her doctor and they upped the dosage of the Prozac. Now, she says that she wants to die about 3-4 times a week. Please do not give her that medication anymore."  Pt told that the pt can refuse medication but if she agrees to take it, then the medication will be given as prescribed. Pt wanted to speak with the provider and was told to call back in the morning. "She will not challenge authority and will take the medication if they say she needs to. So, I'm concerned what will happen."

## 2020-08-23 MED ORDER — FLUOXETINE HCL 10 MG PO CAPS
10.0000 mg | ORAL_CAPSULE | Freq: Every day | ORAL | Status: AC
  Administered 2020-08-23: 10 mg via ORAL
  Filled 2020-08-23 (×2): qty 1

## 2020-08-23 MED ORDER — FLUOXETINE HCL 10 MG PO CAPS: 10.0000 mg | ORAL_CAPSULE | Freq: Every day | ORAL | Status: DC

## 2020-08-23 MED ORDER — DULOXETINE HCL 20 MG PO CPEP
40.0000 mg | ORAL_CAPSULE | Freq: Every day | ORAL | Status: DC
  Administered 2020-08-24 – 2020-08-25 (×2): 40 mg via ORAL
  Filled 2020-08-23 (×4): qty 2

## 2020-08-23 NOTE — Progress Notes (Signed)
[facility] Group Notes:  (Nursing/MHT/Case Management/Adjunct)  Date:  08/23/2020  Time: 2030  Type of Therapy:  wrap u pgroup  Participation Level:  Active  Participation Quality:  Appropriate, Attentive, Sharing and Supportive  Affect:  Appropriate  Cognitive:  Alert  Insight:  Improving  Engagement in Group:  Engaged  Modes of Intervention:  Clarification, Education and Support  Summary of Progress/Problems: Positive thinking and self-care were discussed.   Marcille Buffy 08/23/2020, 9:53 PM

## 2020-08-23 NOTE — Progress Notes (Signed)
   08/23/20 1400  Psych Admission Type (Psych Patients Only)  Admission Status Voluntary  Psychosocial Assessment  Patient Complaints Anxiety  Eye Contact Fair  Facial Expression Sad  Affect Appropriate to circumstance  Speech Logical/coherent;Soft  Interaction Assertive  Motor Activity Slow  Appearance/Hygiene Improved  Behavior Characteristics Cooperative;Anxious  Mood Anxious;Pleasant  Thought Process  Coherency WDL  Content WDL  Delusions None reported or observed  Perception WDL  Hallucination None reported or observed  Judgment Poor  Confusion None  Danger to Self  Current suicidal ideation? Denies  Danger to Others  Danger to Others None reported or observed

## 2020-08-23 NOTE — Progress Notes (Signed)
[place] NOVEL CORONAVIRUS (COVID-19) DAILY CHECK-OFF SYMPTOMS - answer yes or no to each - every day NO YES  Have you had a fever in the past 24 hours?  . Fever (Temp > 37.80C / 100F) X   Have you had any of these symptoms in the past 24 hours? . New Cough .  Sore Throat  .  Shortness of Breath .  Difficulty Breathing .  Unexplained Body Aches   X   Have you had any one of these symptoms in the past 24 hours not related to allergies?   . Runny Nose .  Nasal Congestion .  Sneezing   X   If you have had runny nose, nasal congestion, sneezing in the past 24 hours, has it worsened?  X   EXPOSURES - check yes or no X   Have you traveled outside the state in the past 14 days?  X   Have you been in contact with someone with a confirmed diagnosis of COVID-19 or PUI in the past 14 days without wearing appropriate PPE?  X   Have you been living in the same home as a person with confirmed diagnosis of COVID-19 or a PUI (household contact)?    X   Have you been diagnosed with COVID-19?    X              What to do next: Answered NO to all: Answered YES to anything:   Proceed with unit schedule Follow the [facility] Inpatient Flowsheet.   

## 2020-08-23 NOTE — Progress Notes (Signed)
[facility] MD Progress Note  08/23/2020 7:51 AM Gwendolyn Carrillo  MRN:  [id number]   Chief Complaint: worsening depression and SI  Subjective:  Gwendolyn Carrillo is a 45 y.o. female with a history of MDD and GAD, who was initially admitted for inpatient psychiatric hospitalization on 08/21/2020 for management of worsening depression with SI. The patient is currently on Hospital Day 2.   Chart Review from last 24 hours:  The patient's chart was reviewed and nursing notes were reviewed. The patient's case was discussed in multidisciplinary team meeting. Patient rescinded her 72 hour request for discharge yesterday. No safety concerns or behavioral issues noted. Per MAR she was compliant with scheduled medications with exception of refusal of Linzess. She did require Vistaril X1 for anxiety and Trazodone X1 for sleep.  Information Obtained Today During Patient Interview: The patient was seen and evaluated on the unit. She states she does not feel quite as sad but is still struggling with feelings of depression. She states she is starting to recognize her triggers, and today is sad because she is missing her daughter's prom. She reports that her anxiety has reduced with use of Neurontin and she denies cravings for alcohol or signs of acute withdrawal. She reports that PRN Vistaril made her feel more restless and no longer wants to use this medication. She denies SI, HI, AVH, or paranoia. She states she had a little nausea this morning which has resolved and voices no other physical complaints.   Principal Problem: MDD (major depressive disorder), recurrent severe, without psychosis (HCC) Diagnosis: Principal Problem:   MDD (major depressive disorder), recurrent severe, without psychosis (HCC) Active Problems:   GAD (generalized anxiety disorder)   Gastroparesis   Type II diabetes mellitus (HCC)   Hypertension   Vitamin D deficiency  Total Time Spent in Direct Patient Care:  I personally spent 30  minutes on the unit in direct patient care. The direct patient care time included face-to-face time with the patient, reviewing the patient's chart, communicating with other professionals, and coordinating care. Greater than 50% of this time was spent in counseling or coordinating care with the patient regarding goals of hospitalization, psycho-education, and discharge planning needs.  Past Psychiatric History: see admission H&P  Past Medical History:  Gastroparesis Type II DM - not on medications presently (previously on Ozempic)  HTN Vitamin D deficiency  Past Surgical History:  Procedure Laterality Date  . APPENDECTOMY    . CESAREAN SECTION    . ENDOMETRIAL ABLATION    Umbilical hernia repair Oral surgery  Family History: see admission H&P  Family Psychiatric  History: see admission H&P  Social History:  Social History   Substance and Sexual Activity  Alcohol Use Yes   Comment: occ     Social History   Substance and Sexual Activity  Drug Use Yes  . Types: Marijuana   Comment: "not very often"    Social History   Socioeconomic History  . Marital status: Married    Spouse name: Not on file  . Number of children: Not on file  . Years of education: Not on file  . Highest education level: Not on file  Occupational History  . Not on file  Tobacco Use  . Smoking status: Current Every Day Smoker    Types: Cigars  . Smokeless tobacco: Never Used  . Tobacco comment: "I smoke when I'm drinking"  Vaping Use  . Vaping Use: Never used  Substance and Sexual Activity  . Alcohol use: Yes  Comment: occ  . Drug use: Yes    Types: Marijuana    Comment: "not very often"  . Sexual activity: Not Currently  Other Topics Concern  . Not on file  Social History Narrative  . Not on file   Social Determinants of Health   Financial Resource Strain: Not on file  Food Insecurity: Not on file  Transportation Needs: Not on file  Physical Activity: Not on file  Stress: Not on  file  Social Connections: Not on file   Sleep: Good  Appetite:  Fair  Current Medications: Current Facility-Administered Medications  Medication Dose Route Frequency Provider Last Rate Last Admin  . acetaminophen (TYLENOL) tablet 650 mg  650 mg Oral Q6H PRN White, Patrice L, NP      . alum & mag hydroxide-simeth (MAALOX/MYLANTA) 200-200-20 MG/5ML suspension 30 mL  30 mL Oral Q4H PRN White, Patrice L, NP      . DULoxetine (CYMBALTA) DR capsule 30 mg  30 mg Oral Daily Bartholomew Crews E, MD   30 mg at 08/22/20 0810  . gabapentin (NEURONTIN) capsule 100 mg  100 mg Oral TID Bartholomew Crews E, MD   100 mg at 08/22/20 1710  . hydrochlorothiazide (HYDRODIURIL) tablet 50 mg  50 mg Oral Daily Mason Jim, Jamoni Broadfoot E, MD   50 mg at 08/22/20 2025  . hydrOXYzine (ATARAX/VISTARIL) tablet 25 mg  25 mg Oral TID PRN Jaclyn Shaggy, PA-C   25 mg at 08/22/20 2203  . linaclotide (LINZESS) capsule 145 mcg  145 mcg Oral QAC breakfast Mason Jim, Janely Gullickson E, MD      . lisinopril (ZESTRIL) tablet 20 mg  20 mg Oral Daily Armandina Stammer I, NP   20 mg at 08/22/20 0808  . loperamide (IMODIUM) capsule 2-4 mg  2-4 mg Oral PRN Mason Jim, Daphyne Miguez E, MD      . magnesium hydroxide (MILK OF MAGNESIA) suspension 30 mL  30 mL Oral Daily PRN White, Patrice L, NP      . multivitamin with minerals tablet 1 tablet  1 tablet Oral Daily Comer Locket, MD   1 tablet at 08/22/20 [phone]  . nicotine (NICODERM CQ - dosed in mg/24 hours) patch 14 mg  14 mg Transdermal Daily Armandina Stammer I, NP   14 mg at 08/22/20 0807  . ondansetron (ZOFRAN) tablet 4 mg  4 mg Oral Q8H PRN Mason Jim, Fredrick Geoghegan E, MD      . pantoprazole (PROTONIX) EC tablet 40 mg  40 mg Oral Daily Comer Locket, MD   40 mg at 08/22/20 6237  . thiamine tablet 100 mg  100 mg Oral Daily Mason Jim, Felesia Stahlecker E, MD   100 mg at 08/22/20 0810  . traZODone (DESYREL) tablet 50 mg  50 mg Oral QHS PRN Comer Locket, MD   50 mg at 08/22/20 2115  . [START ON 08/25/2020] Vitamin D (Ergocalciferol) (DRISDOL) capsule  50,000 Units  50,000 Units Oral Q7 days Comer Locket, MD        Lab Results:  Results for orders placed or performed during the hospital encounter of 08/21/20 (from the past 48 hour(s))  Lipid panel     Status: Abnormal   Collection Time: 08/22/20  6:56 AM  Result Value Ref Range   Cholesterol 211 (H) 0 - 200 mg/dL   Triglycerides 628 (H) <150 mg/dL   HDL 50 >31 mg/dL   Total CHOL/HDL Ratio 4.2 RATIO   VLDL 33 0 - 40 mg/dL   LDL Cholesterol 517 (H) 0 -  99 mg/dL    Comment:        Total Cholesterol/HDL:CHD Risk Coronary Heart Disease Risk Table                     Men   Women  1/2 Average Risk   3.4   3.3  Average Risk       5.0   4.4  2 X Average Risk   9.6   7.1  3 X Average Risk  23.4   11.0        Use the calculated Patient Ratio above and the CHD Risk Table to determine the patient's CHD Risk.        ATP III CLASSIFICATION (LDL):  <100     mg/dL   Optimal  [phone]  mg/dL   Near or Above                    Optimal  130-159  mg/dL   Borderline  [id number]  mg/dL   High  >811>190     mg/dL   Very High Performed at [facility], [address] [place], [place] [address]   Hemoglobin A1c     Status: Abnormal   Collection Time: 08/22/20  6:56 AM  Result Value Ref Range   Hgb A1c MFr Bld 5.9 (H) 4.8 - 5.6 %    Comment: (NOTE) Pre diabetes:          5.7%-6.4%  Diabetes:              >6.4%  Glycemic control for   <7.0% adults with diabetes    Mean Plasma Glucose 122.63 mg/dL    Comment: Performed at [facility], [address] [place], [place] [address]  TSH     Status: None   Collection Time: 08/22/20  6:56 AM  Result Value Ref Range   TSH 1.937 0.350 - 4.500 uIU/mL    Comment: Performed by a 3rd Generation assay with a functional sensitivity of <=0.01 uIU/mL. Performed at [facility], [address] [place], [place] [address]     Blood Alcohol level:  Lab Results  Component Value Date   ETH <10 08/20/2020     Metabolic Disorder Labs: Lab Results  Component Value Date   HGBA1C 5.9 (H) 08/22/2020   MPG 122.63 08/22/2020   No results found for: PROLACTIN Lab Results  Component Value Date   CHOL 211 (H) 08/22/2020   TRIG 163 (H) 08/22/2020   HDL 50 08/22/2020   CHOLHDL 4.2 08/22/2020   VLDL 33 08/22/2020   LDLCALC 128 (H) 08/22/2020    Physical Findings: AIMS: Facial and Oral Movements Muscles of Facial Expression: None, normal Lips and Perioral Area: None, normal Jaw: None, normal Tongue: None, normal,Extremity Movements Upper (arms, wrists, hands, fingers): None, normal Lower (legs, knees, ankles, toes): None, normal, Trunk Movements Neck, shoulders, hips: None, normal, Overall Severity Severity of abnormal movements (highest score from questions above): None, normal Incapacitation due to abnormal movements: None, normal Patient's awareness of abnormal movements (rate only patient's report): No Awareness, Dental Status Current problems with teeth and/or dentures?: No Does patient usually wear dentures?: No  CIWA:  CIWA-Ar Total: 1     Musculoskeletal: Strength & Muscle Tone: within normal limits Gait & Station: normal, steady Patient leans: N/A  Psychiatric Specialty Exam: Physical Exam Vitals reviewed.  HENT:     Head: Normocephalic.  Pulmonary:     Effort: Pulmonary effort is  normal.  Neurological:     Mental Status: She is alert.     Review of Systems  Respiratory: Negative for shortness of breath.   Cardiovascular: Negative for chest pain.  Gastrointestinal: Positive for nausea. Negative for diarrhea and vomiting.    Blood pressure 105/82, pulse 91, temperature 98.3 F (36.8 C), temperature source Oral, resp. rate 18, height 5\' 4"  (1.626 m), weight 78.9 kg, SpO2 99 %.Body mass index is 29.87 kg/m.  General Appearance: casually dressed, improved hygiene  Eye Contact:  Good  Speech:  Clear and Coherent and Normal Rate  Volume:  Normal  Mood:  Depressed   Affect:  Constricted  Thought Process:  Goal Directed and Linear  Orientation:  Full (Time, Place, and Person)  Thought Content:  Logical and Denies AVH, paranoia, or delusions; no acute psychosis on exam  Suicidal Thoughts:  No  Homicidal Thoughts:  No  Memory:  Recent;   Good  Judgement:  Fair  Insight:  Fair  Psychomotor Activity:  Normal  Concentration:  Concentration: Good and Attention Span: Good  Recall:  Good  Fund of Knowledge:  Good  Language:  Good  Akathisia:  Negative  Assets:  Communication Skills Desire for Improvement Housing Social Support  ADL's:  Intact  Cognition:  WNL  Sleep:  Number of Hours: 6.5   Treatment Plan Summary: Diagnoses / Active Problems: MDD recurrent severe without psychotic features GAD by hx Gastroparesis Type II DM HTN R/o alcohol use d/o  PLAN: 1. Safety and Monitoring:             -- Voluntary admission to inpatient psychiatric unit for safety, stabilization and treatment             -- Daily contact with patient to assess and evaluate symptoms and progress in treatment             -- Patient's case to be discussed in multi-disciplinary team meeting             -- Observation Level : q15 minute checks             -- Vital signs:  q12 hours             -- Precautions: suicide  2. Psychiatric Diagnoses and Treatment:              MDD recurrent severe without psychotic features             GAD by hx             -- Reduce Prozac to 10mg  today and then stop due to long-half-life of medication. Continue Cymbalta 30mg  today as cross-taper and increase to 40mg  tomorrow.              -- Continue Neurontin 100mg  tid to help with anxiety, potential withdrawal from alcohol, and sleep              -- Refused PRN Vistaril due to past side-effects with medication; Holding Xanax at this time in the context of start of CIWA protocol and recent alcohol/THC use             -- Trazodone 50mg  po qhs PRN insomnia             -- Would benefit from  psychotherapy referral after discharge  -- TSH 1.937             -- Encouraged patient to participate in unit milieu and in scheduled group therapies              --  Short Term Goals: Ability to identify changes in lifestyle to reduce recurrence of condition will improve, Ability to demonstrate self-control will improve and Ability to identify and develop effective coping behaviors will improve             -- Long Term Goals: Improvement in symptoms so as ready for discharge              R/o alcohol use d/o             -- Continue CIWA monitoring for withdrawal with Ativan  for CIWA >10; oral thiamine and MVI replacement  -- CIWA scores:4,1,5             -- Counseled on the need to abstain from alcohol after discharge             -- Short Term Goals: Ability to identify triggers associated with substance abuse/mental health issues will improve             -- Long Term Goals: Improvement in symptoms so as ready for discharge              3. Medical Issues Being Addressed:              HTN             -- Continue home Lisinopril  daily and HCTZ  daily               Type II DM             -- Currently off medication             -- HbgA1c 5.9 - can follow up with PCP after discharge for continued monitoring              Gastroparesis             -- Restart home Linzess daily              -- Protonix  daily              Vitamin D Deficiency             -- Continue home Vit D 50,000IU weekly on Mondays              Hyperlipidemia  -- Cholesterol 211, triglycerides 163, and LDL 128   -- Will need to f/u with PCP after discharge  4. Discharge Planning:              -- Social work and case management to assist with discharge planning and identification of hospital follow-up needs prior to discharge             -- Estimated LOS: 3-4 days             -- Discharge Concerns: Need to establish a safety plan; Medication compliance and effectiveness             --  Discharge Goals: Return home with outpatient referrals for mental health follow-up including medication management/psychotherapy  I certify that inpatient services furnished can reasonably be expected to improve the patient's condition.    Comer Locket, MD, FAPA 08/23/2020, 7:51 AM

## 2020-08-23 NOTE — [facility] Group Notes (Signed)
.  Psychoeducational Group Note  Date: 08/23/2020 Time: 0900-1000    Goal Setting   Purpose of Group: This group helps to provide patients with the steps of setting a goal that is specific, measurable, attainable, realistic and time specific. A discussion on how we keep ourselves stuck with negative self talk.    Participation Level:  Did not attend   Leelan Rajewski A  

## 2020-08-23 NOTE — [facility] Group Notes (Signed)
Psychoeducational Group Note    Date:08/23/2020 Time: 1300-1400    Life Skills:  A group where two lists are made. What people need and what are things that we do that are healthy. The lists are developed by the patients and it is explained that we often do the actions that are not healthy to get our list of needs met.   Purpose of Group: . The group focus' on teaching patients on how to identify their needs and how to develop the coping skills needed to get their needs met  Participation Level:  Active  Participation Quality:  Appropriate  Affect:  Appropriate  Cognitive:  Oriented  Insight:  Improving  Engagement in Group:  Engaged  Additional Comments:  Rates her energy at a 6/10. Participated fully in the group. Added laughter as well.   Paulino Rily

## 2020-08-24 NOTE — Progress Notes (Signed)
   08/23/20 2130  COVID-19 Daily Checkoff  Have you had a fever (temp > 37.80C/100F)  in the past 24 hours?  No  If you have had runny nose, nasal congestion, sneezing in the past 24 hours, has it worsened? No  COVID-19 EXPOSURE  Have you traveled outside the state in the past 14 days? No  Have you been in contact with someone with a confirmed diagnosis of COVID-19 or PUI in the past 14 days without wearing appropriate PPE? No  Have you been living in the same home as a person with confirmed diagnosis of COVID-19 or a PUI (household contact)? No  Have you been diagnosed with COVID-19? No   

## 2020-08-24 NOTE — Progress Notes (Addendum)
   08/24/20 1500  Psych Admission Type (Psych Patients Only)  Admission Status Voluntary  Psychosocial Assessment  Patient Complaints Anxiety  Eye Contact Fair  Facial Expression Animated  Affect Appropriate to circumstance  Speech Logical/coherent;Soft  Interaction Assertive  Motor Activity Slow  Appearance/Hygiene Improved  Behavior Characteristics Cooperative;Appropriate to situation  Mood Anxious;Pleasant  Thought Process  Coherency WDL  Content WDL  Delusions None reported or observed  Perception WDL  Hallucination None reported or observed  Judgment Poor  Confusion None  Danger to Self  Current suicidal ideation? Denies  Danger to Others  Danger to Others None reported or observed  D. Pt presents as friendly, has been calm and cooperative throughout the shift- Pt has been visible in the milieu interacting well with her peers and attending groups Per pt's self inventory, pt rated her depression, hopelessness and anxiety a 3/3/5, respectively.  Pt currently denies SI/HI and AVH  A. Labs and vitals monitored. Pt given and educated on medications. Pt supported emotionally and encouraged to express concerns and ask questions.   R. Pt remains safe with 15 minute checks. Will continue POC.

## 2020-08-24 NOTE — Progress Notes (Addendum)
[facility] MD Progress Note  08/24/2020 2:19 PM Gwendolyn Carrillo  MRN:  [id number]   Chief Complaint: worsening depression and SI  Subjective:  Gwendolyn Carrillo is a 45 y.o. female with a history of MDD and GAD, who was initially admitted for inpatient psychiatric hospitalization on 08/21/2020 for management of worsening depression with SI. The patient is currently on Hospital Day 3.   Chart Review from last 24 hours:  The patient's chart was reviewed and nursing notes were reviewed. The patient's case was discussed in multidisciplinary team meeting. Per nursing, she did attend 2 groups and was visible in the milieu. No safety concerns or behavioral issues were noted. Per MAR she was compliant with scheduled medications except for refusal of Linzess and Nicotine patch. She did receive Trazodone X1 for sleep but did not require PRN medications for anxiety or agitation or CIWA. Recent CIWA scores: 5,0.  Information Obtained Today During Patient Interview: The patient was seen and evaluated on the unit. She reports that she has some nausea this morning after taking a MVI and states that she usually has trouble with vitamins. We discussed the need to monitor for GI symptoms as she cross-tapers off Prozac and onto Cymbalta. She denies any other medication side-effects and feels she is tolerating the medication change well. She states she is "feeling better" and believes her mood is improving. She states she is "a little sleepy" today but believes she slept better overnight. She reports good appetite and voices no other physical complaints. She has showered and has been attending groups. She states she has talked to her family on the phone and they have been supportive. She denies SI, HI, AVH, paranoia, or delusions. She continues to minimize her alcohol use prior to admission and was again encouraged to abstain from alcohol after discharge. She denies cravings for alcohol or current signs of acute withdrawal.    Principal Problem: MDD (major depressive disorder), recurrent severe, without psychosis (HCC) Diagnosis: Principal Problem:   MDD (major depressive disorder), recurrent severe, without psychosis (HCC) Active Problems:   GAD (generalized anxiety disorder)   Gastroparesis   Type II diabetes mellitus (HCC)   Hypertension   Vitamin D deficiency  Total Time Spent in Direct Patient Care:  I personally spent 25 minutes on the unit in direct patient care. The direct patient care time included face-to-face time with the patient, reviewing the patient's chart, communicating with other professionals, and coordinating care. Greater than 50% of this time was spent in counseling or coordinating care with the patient regarding goals of hospitalization, psycho-education, and discharge planning needs.  Past Psychiatric History: see admission H&P  Past Medical History:  Gastroparesis Type II DM - not on medications presently (previously on Ozempic)  HTN Vitamin D deficiency  Past Surgical History:  Procedure Laterality Date  . APPENDECTOMY    . CESAREAN SECTION    . ENDOMETRIAL ABLATION    Umbilical hernia repair Oral surgery  Family History: see admission H&P  Family Psychiatric  History: see admission H&P  Social History:  Social History   Substance and Sexual Activity  Alcohol Use Yes   Comment: occ     Social History   Substance and Sexual Activity  Drug Use Yes  . Types: Marijuana   Comment: "not very often"    Social History   Socioeconomic History  . Marital status: Married    Spouse name: Not on file  . Number of children: Not on file  . Years of education:  Not on file  . Highest education level: Not on file  Occupational History  . Not on file  Tobacco Use  . Smoking status: Current Every Day Smoker    Types: Cigars  . Smokeless tobacco: Never Used  . Tobacco comment: "I smoke when I'm drinking"  Vaping Use  . Vaping Use: Never used  Substance and Sexual  Activity  . Alcohol use: Yes    Comment: occ  . Drug use: Yes    Types: Marijuana    Comment: "not very often"  . Sexual activity: Not Currently  Other Topics Concern  . Not on file  Social History Narrative  . Not on file   Social Determinants of Health   Financial Resource Strain: Not on file  Food Insecurity: Not on file  Transportation Needs: Not on file  Physical Activity: Not on file  Stress: Not on file  Social Connections: Not on file   Sleep: Good  Appetite:  Good  Current Medications: Current Facility-Administered Medications  Medication Dose Route Frequency Provider Last Rate Last Admin  . acetaminophen (TYLENOL) tablet 650 mg  650 mg Oral Q6H PRN White, Patrice L, NP   650 mg at 08/24/20 0716  . alum & mag hydroxide-simeth (MAALOX/MYLANTA) 200-200-20 MG/5ML suspension 30 mL  30 mL Oral Q4H PRN White, Patrice L, NP      . DULoxetine (CYMBALTA) DR capsule 40 mg  40 mg Oral Daily Comer Locket, MD   40 mg at 08/24/20 0717  . gabapentin (NEURONTIN) capsule 100 mg  100 mg Oral TID Bartholomew Crews E, MD   100 mg at 08/24/20 0716  . hydrochlorothiazide (HYDRODIURIL) tablet 50 mg  50 mg Oral Daily Comer Locket, MD   50 mg at 08/24/20 0716  . linaclotide (LINZESS) capsule 145 mcg  145 mcg Oral QAC breakfast Mason Jim, Taylen Osorto E, MD      . lisinopril (ZESTRIL) tablet 20 mg  20 mg Oral Daily Armandina Stammer I, NP   20 mg at 08/24/20 0716  . loperamide (IMODIUM) capsule 2-4 mg  2-4 mg Oral PRN Mason Jim, Diany Formosa E, MD      . magnesium hydroxide (MILK OF MAGNESIA) suspension 30 mL  30 mL Oral Daily PRN White, Patrice L, NP      . multivitamin with minerals tablet 1 tablet  1 tablet Oral Daily Mason Jim, Melyna Huron E, MD   1 tablet at 08/24/20 0716  . nicotine (NICODERM CQ - dosed in mg/24 hours) patch 14 mg  14 mg Transdermal Daily Armandina Stammer I, NP   14 mg at 08/22/20 0807  . ondansetron (ZOFRAN) tablet 4 mg  4 mg Oral Q8H PRN Comer Locket, MD   4 mg at 08/24/20 1011  . pantoprazole  (PROTONIX) EC tablet 40 mg  40 mg Oral Daily Comer Locket, MD   40 mg at 08/24/20 0716  . thiamine tablet 100 mg  100 mg Oral Daily Bartholomew Crews E, MD   100 mg at 08/24/20 0716  . traZODone (DESYREL) tablet 50 mg  50 mg Oral QHS PRN Comer Locket, MD   50 mg at 08/23/20 2130  . [START ON 08/25/2020] Vitamin D (Ergocalciferol) (DRISDOL) capsule 50,000 Units  50,000 Units Oral Q7 days Comer Locket, MD        Lab Results:  No results found for this or any previous visit (from the past 48 hour(s)).  Blood Alcohol level:  Lab Results  Component Value Date   ETH <10  08/20/2020    Metabolic Disorder Labs: Lab Results  Component Value Date   HGBA1C 5.9 (H) 08/22/2020   MPG 122.63 08/22/2020   No results found for: PROLACTIN Lab Results  Component Value Date   CHOL 211 (H) 08/22/2020   TRIG 163 (H) 08/22/2020   HDL 50 08/22/2020   CHOLHDL 4.2 08/22/2020   VLDL 33 08/22/2020   LDLCALC 128 (H) 08/22/2020    Physical Findings: AIMS: Facial and Oral Movements Muscles of Facial Expression: None, normal Lips and Perioral Area: None, normal Jaw: None, normal Tongue: None, normal,Extremity Movements Upper (arms, wrists, hands, fingers): None, normal Lower (legs, knees, ankles, toes): None, normal, Trunk Movements Neck, shoulders, hips: None, normal, Overall Severity Severity of abnormal movements (highest score from questions above): None, normal Incapacitation due to abnormal movements: None, normal Patient's awareness of abnormal movements (rate only patient's report): No Awareness, Dental Status Current problems with teeth and/or dentures?: No Does patient usually wear dentures?: No  CIWA:  CIWA-Ar Total: 0     Musculoskeletal: Strength & Muscle Tone: within normal limits Gait & Station: normal, steady Patient leans: N/A  Psychiatric Specialty Exam: Physical Exam Vitals reviewed.  HENT:     Head: Normocephalic.  Pulmonary:     Effort: Pulmonary effort is  normal.  Neurological:     Mental Status: She is alert.     Review of Systems  Respiratory: Negative for shortness of breath.   Cardiovascular: Negative for chest pain.  Gastrointestinal: Positive for nausea. Negative for abdominal pain, diarrhea and vomiting.  Neurological: Negative for tremors and headaches.    Blood pressure 112/69, pulse 91, temperature 97.8 F (36.6 C), temperature source Oral, resp. rate 18, height 5\' 4"  (1.626 m), weight 78.9 kg, SpO2 99 %.Body mass index is 29.87 kg/m.  General Appearance: casually dressed, good hygiene, appears stated age  Eye Contact:  Good  Speech:  Clear and Coherent and Normal Rate  Volume:  Normal  Mood:  Described as improving - appears less dysphoric  Affect:  Less constricted, somewhat brighter appearing  Thought Process:  Goal Directed and Linear  Orientation:  Full (Time, Place, and Person)  Thought Content:  Logical and Denies AVH, paranoia, or delusions; no acute psychosis on exam  Suicidal Thoughts:  No  Homicidal Thoughts:  No  Memory:  Recent;   Good  Judgement:  Fair  Insight:  Fair  Psychomotor Activity:  Normal  Concentration:  Concentration: Good and Attention Span: Good  Recall:  Good  Fund of Knowledge:  Good  Language:  Good  Akathisia:  Negative  Assets:  Communication Skills Desire for Improvement Housing Social Support  ADL's:  Intact  Cognition:  WNL  Sleep:  Number of Hours: 6.75   Treatment Plan Summary: Diagnoses / Active Problems: MDD recurrent severe without psychotic features GAD by hx Gastroparesis Type II DM HTN R/o alcohol use d/o  PLAN: 1. Safety and Monitoring:             -- Voluntary admission to inpatient psychiatric unit for safety, stabilization and treatment             -- Daily contact with patient to assess and evaluate symptoms and progress in treatment             -- Patient's case to be discussed in multi-disciplinary team meeting             -- Observation Level : q15  minute checks             --  Vital signs:  q12 hours             -- Precautions: suicide  2. Psychiatric Diagnoses and Treatment:              MDD recurrent severe without psychotic features             GAD by hx             -- Taper off Prozac and starting Cymbalta 40mg  today             -- Continue Neurontin 100mg  tid to help with anxiety, potential withdrawal from alcohol, and sleep              -- Trazodone 50mg  po qhs PRN insomnia             -- Would benefit from psychotherapy referral after discharge             -- Encouraged patient to participate in unit milieu and in scheduled group therapies              -- Short Term Goals: Ability to identify changes in lifestyle to reduce recurrence of condition will improve, Ability to demonstrate self-control will improve and Ability to identify and develop effective coping behaviors will improve             -- Long Term Goals: Improvement in symptoms so as ready for discharge              R/o alcohol use d/o             -- Continue CIWA monitoring for withdrawal with Ativan 1mg  for CIWA >10; oral thiamine and MVI replacement             -- Counseled on the need to abstain from alcohol after discharge             -- Short Term Goals: Ability to identify triggers associated with substance abuse/mental health issues will improve             -- Long Term Goals: Improvement in symptoms so as ready for discharge              3. Medical Issues Being Addressed:              HTN             -- Continue home Lisinopril 20mg  daily and HCTZ 50mg  daily   -- Rechecking BMP tomorrow for monitoring of electrolytes with use of diuretic and new antidepressant              Type II DM             -- Currently off medication             -- HbgA1c 5.9 - can follow up with PCP after discharge for continued monitoring              Gastroparesis             -- Restart home Linzess 145mcg daily - patient currently refusing             -- Protonix 40mg   daily              Vitamin D Deficiency             -- Continue home Vit D 50,000IU weekly on Mondays              Hyperlipidemia  --  Cholesterol 211, triglycerides 163, and LDL 128   -- Will need to f/u with PCP after discharge  4. Discharge Planning:              -- Social work and case management to assist with discharge planning and identification of hospital follow-up needs prior to discharge             -- Estimated LOS: 2-3 days             -- Discharge Concerns: Need to establish a safety plan; Medication compliance and effectiveness             -- Discharge Goals: Return home with outpatient referrals for mental health follow-up including medication management/psychotherapy  I certify that inpatient services furnished can reasonably be expected to improve the patient's condition.    Comer Locket, MD, FAPA 08/24/2020, 2:19 PM

## 2020-08-24 NOTE — Progress Notes (Signed)
[place] NOVEL CORONAVIRUS (COVID-19) DAILY CHECK-OFF SYMPTOMS - answer yes or no to each - every day NO YES  Have you had a fever in the past 24 hours?  . Fever (Temp > 37.80C / 100F) X   Have you had any of these symptoms in the past 24 hours? . New Cough .  Sore Throat  .  Shortness of Breath .  Difficulty Breathing .  Unexplained Body Aches   X   Have you had any one of these symptoms in the past 24 hours not related to allergies?   . Runny Nose .  Nasal Congestion .  Sneezing   X   If you have had runny nose, nasal congestion, sneezing in the past 24 hours, has it worsened?  X   EXPOSURES - check yes or no X   Have you traveled outside the state in the past 14 days?  X   Have you been in contact with someone with a confirmed diagnosis of COVID-19 or PUI in the past 14 days without wearing appropriate PPE?  X   Have you been living in the same home as a person with confirmed diagnosis of COVID-19 or a PUI (household contact)?    X   Have you been diagnosed with COVID-19?    X              What to do next: Answered NO to all: Answered YES to anything:   Proceed with unit schedule Follow the [facility] Inpatient Flowsheet.   

## 2020-08-24 NOTE — Progress Notes (Signed)
   08/23/20 2130  Psych Admission Type (Psych Patients Only)  Admission Status Voluntary  Psychosocial Assessment  Patient Complaints Anxiety  Eye Contact Fair  Facial Expression Animated  Affect Appropriate to circumstance  Speech Logical/coherent;Soft  Interaction Assertive  Motor Activity Slow  Appearance/Hygiene Improved  Behavior Characteristics Cooperative;Appropriate to situation  Mood Pleasant  Thought Process  Coherency WDL  Content WDL  Delusions None reported or observed  Perception WDL  Hallucination None reported or observed  Judgment Poor  Confusion None  Danger to Self  Current suicidal ideation? Denies  Danger to Others  Danger to Others None reported or observed

## 2020-08-24 NOTE — Progress Notes (Signed)
   08/23/20 2130  COVID-19 Daily Checkoff  Have you had a fever (temp > 37.80C/100F)  in the past 24 hours?  No  If you have had runny nose, nasal congestion, sneezing in the past 24 hours, has it worsened? No  COVID-19 EXPOSURE  Have you traveled outside the state in the past 14 days? No  Have you been in contact with someone with a confirmed diagnosis of COVID-19 or PUI in the past 14 days without wearing appropriate PPE? No  Have you been living in the same home as a person with confirmed diagnosis of COVID-19 or a PUI (household contact)? No  Have you been diagnosed with COVID-19? No

## 2020-08-25 LAB — BASIC METABOLIC PANEL
Anion gap: 8 (ref 5–15)
BUN: 24 mg/dL — ABNORMAL HIGH (ref 6–20)
CO2: 28 mmol/L (ref 22–32)
Calcium: 9.7 mg/dL (ref 8.9–10.3)
Chloride: 101 mmol/L (ref 98–111)
Creatinine, Ser: 0.77 mg/dL (ref 0.44–1.00)
GFR, Estimated: >60 mL/min (ref >60)
Glucose, Bld: 144 mg/dL — ABNORMAL HIGH (ref 70–99)
Potassium: 3.9 mmol/L (ref 3.5–5.1)
Sodium: 137 mmol/L (ref 135–145)

## 2020-08-25 MED ORDER — VITAMIN D (ERGOCALCIFEROL) 1.25 MG (50000 UNIT) PO CAPS: 50000.0000 [IU] | ORAL_CAPSULE | ORAL | 0 refills | Status: DC

## 2020-08-25 MED ORDER — PANTOPRAZOLE SODIUM 40 MG PO TBEC: 40.0000 mg | DELAYED_RELEASE_TABLET | Freq: Every day | ORAL | 0 refills | Status: DC

## 2020-08-25 MED ORDER — TRAZODONE HCL 50 MG PO TABS: 50.0000 mg | ORAL_TABLET | Freq: Every evening | ORAL | 0 refills | Status: DC | PRN

## 2020-08-25 MED ORDER — HYDROCHLOROTHIAZIDE 50 MG PO TABS: 50.0000 mg | ORAL_TABLET | Freq: Every day | ORAL | 0 refills | Status: DC

## 2020-08-25 MED ORDER — DULOXETINE HCL 40 MG PO CPEP: 40.0000 mg | ORAL_CAPSULE | Freq: Every day | ORAL | 0 refills | Status: DC

## 2020-08-25 MED ORDER — GABAPENTIN 100 MG PO CAPS: 100.0000 mg | ORAL_CAPSULE | Freq: Three times a day (TID) | ORAL | 0 refills | Status: DC

## 2020-08-25 MED ORDER — LISINOPRIL 20 MG PO TABS: 20.0000 mg | ORAL_TABLET | Freq: Every day | ORAL | 0 refills | Status: DC

## 2020-08-25 NOTE — Progress Notes (Signed)
Patient has been up and active on the unit, attended group this evening and has voiced no complaints. Patient currently denies having pain, -si/hi/a/v hall. Support and encouragement offered, safety maintained on unit, will continue to monitor.  

## 2020-08-25 NOTE — Progress Notes (Signed)
RN met with pt and reviewed pt's discharge instructions.  Pt verbalized understanding of discharge instructions and pt did not have any questions. RN reviewed and provided pt with a copy of SRA, AVS and Transition Record.  RN returned pt's belongings to pt.  Pt denied SI/HI/AVH and voiced no concerns.  Pt was appreciative of the care pt received at [facility].  Patient discharged to the lobby without incident. 

## 2020-08-25 NOTE — [facility] Suicide Risk Assessment (Signed)
[facility] INPATIENT:  Family/Significant Other Suicide Prevention Education  Suicide Prevention Education:   Joyce Gross discussed with this CSW that patient began to see a psychiatrist in October of last year and he feels like the depression began to get worse. Support reported that he believes that as she continued to engage with therapeutic supports she was re-traumatized and suicidal thoughts became more prevalent.  Support discussed that patient would be returning home to the residence where they reside.  Support reported that guns/weapons have been removed from the house and he is aware of next steps if they are in crisis again. Support reports that they he will continue to support patient in mental health needs.   Support plans to pick up patient later today.    Education Completed; Joyce Gross has been identified by the patient as the family member/significant other with whom the patient will be residing, and identified as the person(s) who will aid the patient in the event of a mental health crisis (suicidal ideations/suicide attempt).  With written consent from the patient, the family member/significant other has been provided the following suicide prevention education, prior to the and/or following the discharge of the patient.  The suicide prevention education provided includes the following:  Suicide risk factors  Suicide prevention and interventions  National Suicide Hotline telephone number  [facility] assessment telephone number  [facility] Emergency Assistance 911  [facility] and/or Residential Mobile Crisis Unit telephone number  Request made of family/significant other to:  Remove weapons (e.g., guns, rifles, knives), all items previously/currently identified as safety concern.    Remove drugs/medications (over-the-counter, prescriptions, illicit drugs), all items previously/currently identified as a safety concern.  The family member/significant other  verbalizes understanding of the suicide prevention education information provided.  The family member/significant other agrees to remove the items of safety concern listed above.  Kenneshia Rehm E Katheryn Culliton 08/25/2020, 10:01 AM

## 2020-08-25 NOTE — [facility] Suicide Risk Assessment (Signed)
[facility] Discharge Suicide Risk Assessment   Principal Problem: MDD (major depressive disorder), recurrent severe, without psychosis (HCC) Discharge Diagnoses: Principal Problem:   MDD (major depressive disorder), recurrent severe, without psychosis (HCC) Active Problems:   GAD (generalized anxiety disorder)   Gastroparesis   Type II diabetes mellitus (HCC)   Hypertension   Vitamin D deficiency  Total Time Spent in Direct Patient Care:  I personally spent 30 minutes on the unit in direct patient care. The direct patient care time included face-to-face time with the patient, reviewing the patient's chart, communicating with other professionals, and coordinating care. Greater than 50% of this time was spent in counseling or coordinating care with the patient regarding goals of hospitalization, psycho-education, and discharge planning needs.  Subjective: Patient was seen on rounds. She states her mood is improved and she is wanting to go home. She denies SI, HI, AVH, paranoia, or delusions. She did not sleep as well last night but attributes this to her roommate keeping her up. She voices no physical complaints other than intermittent, mild nausea. She attributes this to not eating enough prior to taking her MVI and we discussed that this could also be related to her cross-taper off Prozac and onto Cymbalta. She was advised that she can switch her Cymbalta to nighttime dosing if needed should nausea persists and should monitor to see if symptoms improve the longer she is on the medication. She denies cravings for alcohol or signs of withdrawal. She can articulate a safety plan for discharge and after care plans were reviewed.   Labs 08/25/20: BMP WNL except for nonfasting glucose 144 and BUN 24 (creatinine normal at 0.77)  Musculoskeletal: Strength & Muscle Tone: within normal limits Gait & Station: normal, steady Patient leans: N/A  Psychiatric Specialty Exam: Review of Systems  Respiratory:  Negative for shortness of breath.   Cardiovascular: Negative for chest pain.  Gastrointestinal: Positive for nausea. Negative for abdominal pain, diarrhea and vomiting.    Blood pressure 108/70, pulse 100, temperature 98.3 F (36.8 C), temperature source Oral, resp. rate 18, height 5\' 4"  (1.626 m), weight 78.9 kg, SpO2 99 %.Body mass index is 29.87 kg/m.  General Appearance: casually dressed, adequate hygiene, well engaged with examiner  Eye Contact::  Good  Speech:  Clear and Coherent and Normal Rate  Volume:  Normal  Mood:  mildly anxious - more euthymic  Affect:  Brighter appearing, less constricted  Thought Process:  Goal Directed and Linear  Orientation:  Full (Time, Place, and Person)  Thought Content:  Logical and no evidence of psychosis, paranoia, or delusions  Suicidal Thoughts:  No  Homicidal Thoughts:  No  Memory:  Recent;   Good  Judgement:  Fair  Insight:  Fair  Psychomotor Activity:  Normal  Concentration:  Good  Recall:  Good  Fund of Knowledge:Good  Language: Good  Akathisia:  Negative  Assets:  Communication Skills Desire for Improvement Financial Resources/Insurance Housing Resilience Social Support Vocational/Educational  Sleep:  Number of Hours: 6.75  Cognition: WNL  ADL's:  Intact   Mental Status Per Nursing Assessment::   On Admission:  Self-harm thoughts  Demographic Factors:  divorced  Loss Factors: physical health concerns  Historical Factors: Family history of mental illness or substance abuse  Risk Reduction Factors:   Sense of responsibility to family, Employed, Living with another person, especially a relative, Positive social support, Positive therapeutic relationship and Positive coping skills or problem solving skills  Continued Clinical Symptoms:  Depression:   Recent sense  of peace/wellbeing  Cognitive Features That Contribute To Risk:  None    Suicide Risk:  Minimal: No identifiable suicidal ideation.  Patients presenting  with no risk factors but with morbid ruminations; may be classified as minimal risk based on the severity of the depressive symptoms   Follow-up Information    Group, [name]. Go on 09/15/2020.   Specialty: Behavioral Health Why: You have an appointment for medication management 09/15/20 at 10:00 am.  This appointment will be held in person.  You have an appointment for therapy services on 09/24/20 at 11:00 am. (You may cancel this appointment if not needed).    Contact information: [address] [place] [address] [phone]        [facility]. Go on 09/29/2020.   Why: You have a hospital follow up appointment for primary care services on 09/29/20 at 2:10 pm.   This appointment will be held in person.  Contact information: [address] [place] [address] [phone]              Plan Of Care/Follow-up recommendations:  Activity:  as tolerated Diet:  heart healthy Other:  Patient advised to keep scheduled mental health outpatient appointments and to comply with medications. She was made aware that her BUN is slightly elevated and she needs to fluid hydrate while on a diuretic and have her renal function and electrolytes monitored by her outpatient physician. She was encoruaged to have her primary care provider recheck and manage her hypertension, gastroparesis, and DM. She was made aware that her lipids were mildly elevated during admission. She was advised that she should work on dietary changes/increased exercise and see her outpatient physician for monitoring/treatment of her lipids. She was encouraged to abstain from alcohol and illicit substances after discharge.   Comer Locket, MD, FAPA 08/25/2020, 10:02 AM

## 2020-08-25 NOTE — Progress Notes (Signed)
Recreation Therapy Notes  Date:  5.2.22 Time: 0947 Location: 300 [address] Dayroom  Group Topic: Stress Management  Goal Area(s) Addresses:  Patient will identify positive stress management techniques. Patient will identify benefits of using stress management post d/c.  Behavioral Response: Engaged  Intervention: Stress Management  Activity: Meditation.  LRT played meditation that focused on being able to free yourself by not holding on to things you can't change.  Patients were to listen and follow as meditation played to engage in activity.   Education:  Stress Management, Discharge Planning.   Education Outcome: Acknowledges Education  Clinical Observations/Feedback: Pt attended and participated in group activity.   Caroll Rancher, LRT/CTRS         Lillia Abed, June Vacha A 08/25/2020 10:59 AM

## 2020-08-25 NOTE — Progress Notes (Signed)
   08/24/20 2015  COVID-19 Daily Checkoff  Have you had a fever (temp > 37.80C/100F)  in the past 24 hours?  No  If you have had runny nose, nasal congestion, sneezing in the past 24 hours, has it worsened? No  COVID-19 EXPOSURE  Have you traveled outside the state in the past 14 days? No  Have you been in contact with someone with a confirmed diagnosis of COVID-19 or PUI in the past 14 days without wearing appropriate PPE? No  Have you been living in the same home as a person with confirmed diagnosis of COVID-19 or a PUI (household contact)? No  Have you been diagnosed with COVID-19? No   

## 2020-08-25 NOTE — Progress Notes (Signed)
  [facility] Adult Case Management Discharge Plan :  Will you be returning to the same living situation after discharge:  Yes,  Will be going back to residence with Fiance, Joyce Gross, as primary support.  Support confirmed that no weapons/guns are in the residence and patient will not have access to weapons At discharge, do you have transportation home?: Yes,  Joyce Gross, fiance Do you have the ability to pay for your medications: Yes,  private insurance  Release of information consent forms completed and in the chart;  Patient's signature needed at discharge.  Patient to Follow up at:  Follow-up Information    Group, [name]. Go on 09/15/2020.   Specialty: Behavioral Health Why: You have an appointment for medication management 09/15/20 at 10:00 am.  This appointment will be held in person.  You have an appointment for therapy services on 09/24/20 at 11:00 am. (You may cancel this appointment if not needed).    Contact information: [address] [place] [address] [phone]        [facility]. Go on 09/29/2020.   Why: You have a hospital follow up appointment for primary care services on 09/29/20 at 2:10 pm.   This appointment will be held in person.  Contact information: [address] [place] [address] [phone]              Next level of care provider has access to [facility] Link:yes  Safety Planning and Suicide Prevention discussed: Yes,  with Joyce Gross, fiance    Has patient been referred to the Quitline?: Patient refused referral , Patient reports she only recently started and does not feel like she needs extra support to quit. Provided psychoeducation and places to contact if she ever did need support.   Patient has been referred for addiction treatment: N/A- no issues  Cristopher Ciccarelli E Demonica Farrey, LCSW 08/25/2020, 10:13 AM

## 2020-08-25 NOTE — Discharge Summary (Signed)
Physician Discharge Summary Note  Patient:  Gwendolyn Carrillo is an 45 y.o., female MRN:  [id number] DOB:  06/19/1975 Patient phone:  [phone] (home)  Patient address:   [address] [place] [id number],  Total Time spent with patient: 30 minutes  Date of Admission:  08/21/2020 Date of Discharge: 08/25/2020  Reason for Admission:  (From MD's admission note): Patient is a 45y/o female with h/o MDD and GAD, who was [facility] management of worsening depression with SI. The patient states she has suffered with depressive episodes off and on since childhood and has been in her current severe depressive episode for over a month. She states it has been an effort to shower, to comb her hair, to go to work, or to talk to people, and she has found herself more isolative recently. She reports associated depressive symptoms including worsening anhedonia, poor sleep (4-6 hours nightly), low appetite, poor focus, low energy, and belief that she is a "failure as a person." She states that she was started on Prozac 10mg  last November and the dose was gradually titrated up to 30mg  over a month ago. She can tell that of all the antidepressants she has previously tried that Prozac has worked better for her, but she is not finding symptom relief at her present dose. She denies previously having mood stabilizer or antipsychotic medications as augmentation to her SSRI. She states that she mentioned to her boyfriend that she was having thoughts of getting a gun and killing herself at which time he insisted that she seek assessment at the ED. She states she has never tried to commit suicide in the past and denies previous psychiatric inpatient admissions. She previously had psychotherapy through [facility] but states she feels she needs a new provider. She is seeing a mental health provider through [name] (Dr. Kallie LocksMozingo).   She reports that in addition to depression she  has been diagnosed with GAD. She states she has intermittent panic attacks and physical symptoms associated with her anxiety. She is prescribed Xanax 1mg  bid but states that she usually takes 0.5mg  once a day if she is anxious at work. To help self-medicate for her anxiety and depression, she states she has also been using 3-4 shots of alcohol nightly or every other night since January. She denies withdrawal when she is not drinking and does not drink during the day. She denies cravings for alcohol or previous rehab/detox. 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 She denies h/o AVH, paranoia, mania/hypomania, PTSD symptoms, or HI.   Evaluation on the unit today: Patient was seen and evaluated. She denies SI/HI/AVH, paranoia and delusions. She is taking her medications as prescribed and has no issues with them. She reported good sleep and a good appetite. She is attending group and interacting appropriately with staff and peers. She has follow up appointments for therapy, medication management and a PCP, listed in her discharge paperwork. She agrees to continue to take her medications and got to therapy after discharge. Patient is stable for discharge home today.   Principal Problem: MDD (major depressive disorder), recurrent severe, without psychosis (HCC) Discharge Diagnoses: Principal Problem:   MDD (major depressive disorder), recurrent severe, without psychosis (HCC) Active Problems:   GAD (generalized anxiety disorder)   Gastroparesis   Type II diabetes mellitus (HCC)   Hypertension   Vitamin D deficiency   Past Psychiatric History: See H&P  Past Medical History:  Past Medical History:  Diagnosis Date  . Anxiety   . Diabetes mellitus without complication (HCC)   . Hypertension     Past Surgical History:  Procedure Laterality Date  . APPENDECTOMY    . CESAREAN SECTION    . ENDOMETRIAL ABLATION     Family History: History reviewed. No pertinent  family history. Family Psychiatric  History: See H&P Social History:  Social History   Substance and Sexual Activity  Alcohol Use Yes   Comment: occ     Social History   Substance and Sexual Activity  Drug Use Yes  . Types: Marijuana   Comment: "not very often"    Social History   Socioeconomic History  . Marital status: Married    Spouse name: Not on file  . Number of children: Not on file  . Years of education: Not on file  . Highest education level: Not on file  Occupational History  . Not on file  Tobacco Use  . Smoking status: Current Every Day Smoker    Types: Cigars  . Smokeless tobacco: Never Used  . Tobacco comment: "I smoke when I'm drinking"  Vaping Use  . Vaping Use: Never used  Substance and Sexual Activity  . Alcohol use: Yes    Comment: occ  . Drug use: Yes    Types: Marijuana    Comment: "not very often"  . Sexual activity: Not Currently  Other Topics Concern  . Not on file  Social History Narrative  . Not on file   Social Determinants of Health   Financial Resource Strain: Not on file  Food Insecurity: Not on file  Transportation Needs: Not on file  Physical Activity: Not on file  Stress: Not on file  Social Connections: Not on file    Hospital Course:  After the above admission evaluation, Joshlyn's presenting symptoms were noted. She was recommended for mood stabilization treatments. The medication regimen targeting those presenting symptoms were discussed with her & initiated with her consent. Her UDS on arrival to the ED was positive for benzodiazepines and THC, BAL was negative. She was however medicated, stabilized & discharged on the medications as listed on her discharge medication list below. Besides the mood stabilization treatments, Kitiara was also enrolled & participated in the group counseling sessions being offered & held on this unit. She learned coping skills. She presented no other significant pre-existing medical issues that  required treatment. She tolerated his treatment regimen without any adverse effects or reactions reported.   During the course of her hospitalization, the 15-minute checks were adequate to ensure patient's safety. Marquelle did not display any dangerous, violent or suicidal behavior on the unit.  She interacted with patients & staff appropriately, participated appropriately in the group sessions/therapies. Her medications were addressed & adjusted to meet her needs. She was recommended for outpatient follow-up care & medication management upon discharge to assure continuity of care & mood stability.  At the time of discharge patient is not reporting any acute suicidal/homicidal ideations. She feels more confident about her self-care & in managing his mental health. She currently denies any new issues or concerns. Education and supportive counseling provided throughout her hospital stay & upon discharge.   Today upon her discharge evaluation with the attending psychiatrist, Paislee shares she is doing well. She denies any other specific concerns. She is sleeping well. Her appetite is good. She denies other physical complaints. She denies AH/VH, delusional thoughts or paranoia. She does not appear to be responding to  any internal stimuli. She feels that her medications have been helpful & is in agreement to continue his/her current treatment regimen as recommended. She was able to engage in safety planning including plan to return to [facility] or contact emergency services if she feels unable to maintain his/her own safety or the safety of others. Pt had no further questions, comments, or concerns. [facility] with all personal belongings in no apparent distress. Transportation per private vehicle.   Physical Findings: AIMS: Facial and Oral Movements Muscles of Facial Expression: None, normal Lips and Perioral Area: None, normal Jaw: None, normal Tongue: None, normal,Extremity Movements Upper (arms, wrists,  hands, fingers): None, normal Lower (legs, knees, ankles, toes): None, normal, Trunk Movements Neck, shoulders, hips: None, normal, Overall Severity Severity of abnormal movements (highest score from questions above): None, normal Incapacitation due to abnormal movements: None, normal Patient's awareness of abnormal movements (rate only patient's report): No Awareness, Dental Status Current problems with teeth and/or dentures?: No Does patient usually wear dentures?: No  CIWA:  CIWA-Ar Total: 0 COWS:     Musculoskeletal: Strength & Muscle Tone: within normal limits Gait & Station: normal Patient leans: N/A  Psychiatric Specialty Exam:  Presentation  General Appearance: Appropriate for Environment; Casual  Eye Contact:Good  Speech:Clear and Coherent; Normal Rate  Speech Volume:Normal  Handedness:Right  Mood and Affect  Mood:Euthymic  Affect:Appropriate; Congruent  Thought Process  Thought Processes:Coherent; Goal Directed; Linear  Descriptions of Associations:Intact  Orientation:Full (Time, Place and Person)  Thought Content:Logical  History of Schizophrenia/Schizoaffective disorder:No  Duration of Psychotic Symptoms:No data recorded Hallucinations:Hallucinations: None  Ideas of Reference:None  Suicidal Thoughts:Suicidal Thoughts: No  Homicidal Thoughts:Homicidal Thoughts: No  Sensorium  Memory:Immediate Good; Recent Good; Remote Fair  Judgment:Fair  Insight:Fair  Executive Functions  Concentration:Good  Attention Span:Good  Recall:Good  Fund of Knowledge:Good  Language:Good  Psychomotor Activity  Psychomotor Activity:Psychomotor Activity: Normal  Assets  Assets:Communication Skills; Desire for Improvement; Financial Resources/Insurance; Housing; Physical Health; Resilience; Social Support  Sleep  Sleep:Sleep: Good Number of Hours of Sleep: 6.75   Physical Exam: Physical Exam Vitals and nursing note reviewed.  Constitutional:       Appearance: Normal appearance.  HENT:     Head: Normocephalic.  Pulmonary:     Effort: Pulmonary effort is normal.  Musculoskeletal:        General: Normal range of motion.     Cervical back: Normal range of motion.  Neurological:     Mental Status: She is alert and oriented to person, place, and time.    Review of Systems  Constitutional: Negative for fever.  HENT: Negative for congestion and sore throat.   Respiratory: Negative for cough and shortness of breath.   Cardiovascular: Negative for chest pain.  Gastrointestinal: Negative.   Genitourinary: Negative.   Musculoskeletal: Negative.   Neurological: Negative.    Blood pressure 108/70, pulse 100, temperature 98.3 F (36.8 C), temperature source Oral, resp. rate 18, height 5\' 4"  (1.626 m), weight 78.9 kg, SpO2 99 %. Body mass index is 29.87 kg/m.  Has this patient used any form of tobacco in the last 30 days? (Cigarettes, Smokeless Tobacco, Cigars, and/or Pipes) Yes, Yes, A prescription for an FDA-approved tobacco cessation medication was offered at discharge and the patient refused  Blood Alcohol level:  Lab Results  Component Value Date   [facility] <10 08/20/2020    Metabolic Disorder Labs:  Lab Results  Component Value Date   HGBA1C 5.9 (H) 08/22/2020   MPG 122.63 08/22/2020  No results found for: PROLACTIN Lab Results  Component Value Date   CHOL 211 (H) 08/22/2020   TRIG 163 (H) 08/22/2020   HDL 50 08/22/2020   CHOLHDL 4.2 08/22/2020   VLDL 33 08/22/2020   LDLCALC 128 (H) 08/22/2020    See Psychiatric Specialty Exam and Suicide Risk Assessment completed by Attending Physician prior to discharge.  Discharge destination:  Home  Is patient on multiple antipsychotic therapies at discharge:  No   Has Patient had three or more failed trials of antipsychotic monotherapy by history:  No  Recommended Plan for Multiple Antipsychotic Therapies: NA  Discharge Instructions    Diet - low sodium heart healthy    Complete by: As directed    Increase activity slowly   Complete by: As directed      Allergies as of 08/25/2020   No Known Allergies     Medication List    STOP taking these medications   ALPRAZolam 1 MG tablet Commonly known as: Xanax   FLUoxetine 10 MG capsule Commonly known as: PROzac   FLUoxetine 20 MG capsule Commonly known as: PROzac   glipiZIDE 5 MG 24 hr tablet Commonly known as: GLUCOTROL XL   GOODY HEADACHE PO   metoprolol succinate 50 MG 24 hr tablet Commonly known as: TOPROL-XL     TAKE these medications     Indication  DULoxetine HCl 40 MG Cpep Take 40 mg by mouth daily. Start taking on: Aug 26, 2020  Indication: Major Depressive Disorder   gabapentin 100 MG capsule Commonly known as: NEURONTIN Take 1 capsule (100 mg total) by mouth 3 (three) times daily.  Indication: Alcohol Withdrawal Syndrome   hydrochlorothiazide 50 MG tablet Commonly known as: HYDRODIURIL Take 1 tablet (50 mg total) by mouth daily. Start taking on: Aug 26, 2020 What changed:   medication strength  how much to take  Indication: High Blood Pressure Disorder   Linzess 145 MCG Caps capsule Generic drug: linaclotide Take 145 mcg by mouth daily as needed (constipation).  Indication: Constipation caused by Irritable Bowel Syndrome   lisinopril 20 MG tablet Commonly known as: ZESTRIL Take 1 tablet (20 mg total) by mouth daily.  Indication: High Blood Pressure Disorder   pantoprazole 40 MG tablet Commonly known as: PROTONIX Take 1 tablet (40 mg total) by mouth daily. Start taking on: Aug 26, 2020  Indication: Gastroesophageal Reflux Disease, Heartburn   traZODone 50 MG tablet Commonly known as: DESYREL Take 1 tablet (50 mg total) by mouth at bedtime as needed for sleep.  Indication: Trouble Sleeping   Vitamin D (Ergocalciferol) 1.25 MG (50000 UNIT) Caps capsule Commonly known as: DRISDOL Take 1 capsule (50,000 Units total) by mouth every 7 (seven) days. Start taking  on: Sep 01, 2020  Indication: Vitamin D Deficiency       Follow-up Information    Group, [name]. Go on 09/15/2020.   Specialty: Behavioral Health Why: You have an appointment for medication management 09/15/20 at 10:00 am.  This appointment will be held in person.  You have an appointment for therapy services on 09/24/20 at 11:00 am. (You may cancel this appointment if not needed).    Contact information: [address] [place] [address] [phone]        [facility]. Go on 09/29/2020.   Why: You have a hospital follow up appointment for primary care services on 09/29/20 at 2:10 pm.   This appointment will be held in person.  Contact  information: [place] [address] [phone]       [facility]. Go to.   Why: go to pharmacy for low cost prescriptions and assistance with medications.  Contact information: [address] [place],  [place]  [address]              Follow-up recommendations:  Activity:  as tolerated Diet:  Heart Healthy  Comments:  Prescriptions provided at discharge.  Patient is agreeable with the discharge plan.  She was given opportunity to ask questions.  She appears to feel comfortable with discharge denies any current suicidal or homicidal thoughts.   Patient is instructed prior to discharge to: Take all medications as prescribed by her mental healthcare provider. Report any adverse effects and or reactions from the medicines to her outpatient provider promptly. Patient has been instructed & cautioned: To not engage in alcohol and or illegal drug use while on prescription medicines. In the event of worsening symptoms, patient is instructed to call the crisis hotline, 911 and or go to the nearest ED for appropriate evaluation and treatment of symptoms. To follow-up with his primary care provider for your other medical issues,  concerns and or health care needs.   Signed: Laveda Abbe, NP 08/25/2020, 11:44 AM

## 2020-08-26 NOTE — [facility] Group Notes (Signed)
ADULT GRIEF GROUP NOTE:    Spiritual care group on grief and loss facilitated by chaplain Dyanne Carrel, [place]    Group Goal:    Support / Education around grief and loss   Members engage in facilitated group support and psycho-social education.    Group Description:    Following introductions and group rules, group members engaged in facilitated group dialog and support around topic of loss, with particular support around experiences of loss in their lives. Group Identified types of loss (relationships / self / things) and identified patterns, circumstances, and changes that precipitate losses. Reflected on thoughts / feelings around loss, normalized grief responses, and recognized variety in grief experience.   Group noted Worden's four tasks of grief in discussion.    Group drew on Adlerian / Rogerian, narrative, MI,   Patient Progress:  Gwendolyn Carrillo attended and participated in group after she joining partway through.  She shared openly and engaged in conversation about her own experiences and feelings around loss as well as some coping strategies.  Chaplain Dyanne Carrel, [facility] Pager, [phone] 2:30 PM

## 2020-09-15 ENCOUNTER — Ambulatory Visit (INDEPENDENT_AMBULATORY_CARE_PROVIDER_SITE_OTHER): Payer: 59 | Admitting: Adult Health

## 2020-09-15 ENCOUNTER — Encounter: Payer: Self-pay | Admitting: Adult Health

## 2020-09-15 ENCOUNTER — Other Ambulatory Visit: Payer: Self-pay

## 2020-09-15 DIAGNOSIS — F411 Generalized anxiety disorder: Secondary | ICD-10-CM | POA: Diagnosis not present

## 2020-09-15 DIAGNOSIS — F331 Major depressive disorder, recurrent, moderate: Secondary | ICD-10-CM

## 2020-09-15 DIAGNOSIS — G4700 Insomnia, unspecified: Secondary | ICD-10-CM

## 2020-09-15 DIAGNOSIS — F422 Mixed obsessional thoughts and acts: Secondary | ICD-10-CM | POA: Diagnosis not present

## 2020-09-15 DIAGNOSIS — F410 Panic disorder [episodic paroxysmal anxiety] without agoraphobia: Secondary | ICD-10-CM | POA: Diagnosis not present

## 2020-09-15 MED ORDER — GABAPENTIN 100 MG PO CAPS: 100.0000 mg | ORAL_CAPSULE | Freq: Three times a day (TID) | ORAL | 5 refills | Status: DC

## 2020-09-15 NOTE — Progress Notes (Signed)
Gwendolyn Carrillo [id number] Jul 11, 1975 45 y.o.  Subjective:   Patient ID:  Gwendolyn Carrillo is a 45 y.o. (DOB 07-Jul-1975) female.  Chief Complaint: No chief complaint on file.   HPI Gwendolyn Carrillo presents to the office today for follow-up of MDD, mixed obsessional thoughts , panic attacks, and GAD.   Recently admitted to [facility] for 5 days for SI - collateral on file and reviewed.  Describes mood today as "ok". Tearful during interview. Pleasant. Mood symptoms - decreased depression, anxiety, and irritability. Decreased panic attacks. Stating "I'm doing better". Has been taking the Gabapentin 100mg  daily. Did not start the Cymbalta. Increased stressors with being hospitalized. Brother recently has a "massive" stroke. Working for . Improved interest and motivation. Taking medications as prescribed. Seeing therapist.  Energy level improved. Active, does not have a regular exercise routine.  Able to enjoy usual interests and activities. Single. Dating. Has 4 children 25, 19, 23, and 15. Family local. Spending time with family. Appetite improved. Weight gain - 146 to 169 pounds.  Sleep has improved. Averages 8 hours. Focus and concentration difficulties. Completing tasks. Managing some aspects of household. Works full time.  Denies SI or HI.  Denies AH or VH.   Previous medication trials: Xanax 0.5mg  daily, Lexapro x 1 year - felt tired all the time, Trintellix, any antidepressant.    AIMS   Flowsheet Row [facility]  AIMS Total Score 0    AUDIT   Flowsheet Row [facility]  Alcohol Use Disorder Identification Test Final Score (AUDIT) 6    PHQ2-9   Flowsheet Row ED from [facility]/20/2020 in [facility]  PHQ-2 Total Score 6  PHQ-9 Total Score 17    Flowsheet  Row [facility]  C-SSRS RISK CATEGORY Moderate Risk High Risk       Review of Systems:  Review of Systems  Musculoskeletal: Negative for gait problem.  Neurological: Negative for tremors.  Psychiatric/Behavioral:       Please refer to HPI    Medications: I have reviewed the patient's current medications.  Current Outpatient Medications  Medication Sig Dispense Refill  . gabapentin (NEURONTIN) 100 MG capsule Take 1 capsule (100 mg total) by mouth 3 (three) times daily. 90 capsule 5  . hydrochlorothiazide (HYDRODIURIL) 50 MG tablet Take 1 tablet (50 mg total) by mouth daily. 30 tablet 0  . LINZESS 145 MCG CAPS capsule Take 145 mcg by mouth daily as needed (constipation).    08/22/2020 lisinopril (ZESTRIL) 20 MG tablet Take 1 tablet (20 mg total) by mouth daily. 30 tablet 0  . pantoprazole (PROTONIX) 40 MG tablet Take 1 tablet (40 mg total) by mouth daily. 30 tablet 0  . Vitamin D, Ergocalciferol, (DRISDOL) 1.25 MG (50000 UNIT) CAPS capsule Take 1 capsule (50,000 Units total) by mouth every 7 (seven) days. 5 capsule 0   No current facility-administered medications for this visit.    Medication Side Effects: None  Allergies: No Known Allergies  Past Medical History:  Diagnosis Date  . Anxiety   . Diabetes mellitus without complication (HCC)   . Hypertension     Past Medical History, Surgical history, Social history, and Family history were reviewed and updated as appropriate.   Please see review of systems for  further details on the patient's review from today.   Objective:   Physical Exam:  There were no vitals taken for this visit.  Physical Exam Constitutional:      General: She is not in acute distress. Musculoskeletal:        General: No deformity.  Neurological:     Mental Status: She is alert and oriented to person, place, and  time.     Coordination: Coordination normal.  Psychiatric:        Attention and Perception: Attention and perception normal. She does not perceive auditory or visual hallucinations.        Mood and Affect: Mood normal. Mood is not anxious or depressed. Affect is not labile, blunt, angry or inappropriate.        Speech: Speech normal.        Behavior: Behavior normal.        Thought Content: Thought content normal. Thought content is not paranoid or delusional. Thought content does not include homicidal or suicidal ideation. Thought content does not include homicidal or suicidal plan.        Cognition and Memory: Cognition and memory normal.        Judgment: Judgment normal.     Comments: Insight intact     Lab Review:     Component Value Date/Time   NA 137 08/25/2020 0626   K 3.9 08/25/2020 0626   CL 101 08/25/2020 0626   CO2 28 08/25/2020 0626   GLUCOSE 144 (H) 08/25/2020 0626   BUN 24 (H) 08/25/2020 0626   CREATININE 0.77 08/25/2020 0626   CALCIUM 9.7 08/25/2020 0626   PROT 7.3 08/20/2020 1037   ALBUMIN 3.8 08/20/2020 1037   AST 35 08/20/2020 1037   ALT 34 08/20/2020 1037   ALKPHOS 52 08/20/2020 1037   BILITOT 0.3 08/20/2020 1037   GFRNONAA >60 08/25/2020 0626   GFRAA >60 10/10/2019 1441       Component Value Date/Time   WBC 8.3 08/20/2020 1037   RBC 4.13 08/20/2020 1037   HGB 13.5 08/20/2020 1037   HCT 38.7 08/20/2020 1037   PLT 266 08/20/2020 1037   MCV 93.7 08/20/2020 1037   MCH 32.7 08/20/2020 1037   MCHC 34.9 08/20/2020 1037   RDW 12.4 08/20/2020 1037   LYMPHSABS 1.4 08/20/2020 1037   MONOABS 0.6 08/20/2020 1037   EOSABS 0.1 08/20/2020 1037   BASOSABS 0.0 08/20/2020 1037    No results found for: POCLITH, LITHIUM   No results found for: PHENYTOIN, PHENOBARB, VALPROATE, CBMZ   .res Assessment: Plan:    Plan:  PDMP reviewed  1. D/C Xanax 1mg  BID - recent hospitalization ETOH and THC. 2. Gabapentin 100mg  TID  Continue therapy - [facility] -  therapist.  RTC 3 months  Patient advised to contact office with any questions, adverse effects, or acute worsening in signs and symptoms.  Discussed potential benefits, risk, and side effects of benzodiazepines to include potential risk of tolerance and dependence, as well as possible drowsiness.  Advised patient not to drive if experiencing drowsiness and to take lowest possible effective dose to minimize risk of dependence and tolerance.     Diagnoses and all orders for this visit:  Generalized anxiety disorder -     gabapentin (NEURONTIN) 100 MG capsule; Take 1 capsule (100 mg total) by mouth 3 (three) times daily.  Panic attacks -     gabapentin (NEURONTIN) 100 MG capsule; Take 1 capsule (100 mg total) by mouth 3 (three) times daily.  Mixed obsessional thoughts and acts -     gabapentin (NEURONTIN) 100 MG capsule; Take 1 capsule (100 mg total) by mouth 3 (three) times daily.  Insomnia, unspecified type -     gabapentin (NEURONTIN) 100 MG capsule; Take 1 capsule (100 mg total) by mouth 3 (three) times daily.  Major depressive disorder, recurrent episode, moderate (HCC) -     gabapentin (NEURONTIN) 100 MG capsule; Take 1 capsule (100 mg total) by mouth 3 (three) times daily.     Please see After Visit Summary for patient specific instructions.  Future Appointments  Date Time Provider Department Center  09/29/2020  2:10 PM Grayce Sessions, NP [facility] None  03/18/2021 10:00 AM Gaberial Cada, Thereasa Solo, NP [facility] None    No orders of the defined types were placed in this encounter.   -------------------------------

## 2020-09-21 ENCOUNTER — Other Ambulatory Visit (HOSPITAL_COMMUNITY): Payer: Self-pay | Admitting: Psychiatry

## 2020-09-21 DIAGNOSIS — F410 Panic disorder [episodic paroxysmal anxiety] without agoraphobia: Secondary | ICD-10-CM

## 2020-09-21 DIAGNOSIS — F422 Mixed obsessional thoughts and acts: Secondary | ICD-10-CM

## 2020-09-21 DIAGNOSIS — G4700 Insomnia, unspecified: Secondary | ICD-10-CM

## 2020-09-21 DIAGNOSIS — F411 Generalized anxiety disorder: Secondary | ICD-10-CM

## 2020-09-21 DIAGNOSIS — F331 Major depressive disorder, recurrent, moderate: Secondary | ICD-10-CM

## 2020-09-24 ENCOUNTER — Ambulatory Visit: Payer: Self-pay | Admitting: Mental Health

## 2020-09-29 ENCOUNTER — Other Ambulatory Visit: Payer: Self-pay

## 2020-09-29 ENCOUNTER — Encounter (INDEPENDENT_AMBULATORY_CARE_PROVIDER_SITE_OTHER): Payer: Self-pay | Admitting: Primary Care

## 2020-09-29 ENCOUNTER — Ambulatory Visit (INDEPENDENT_AMBULATORY_CARE_PROVIDER_SITE_OTHER): Payer: 59 | Admitting: Primary Care

## 2020-09-29 VITALS — BP 129/88 | HR 82 | Temp 98.1°F | Ht 64.0 in | Wt 182.2 lb

## 2020-09-29 DIAGNOSIS — E559 Vitamin D deficiency, unspecified: Secondary | ICD-10-CM | POA: Diagnosis not present

## 2020-09-29 DIAGNOSIS — I10 Essential (primary) hypertension: Secondary | ICD-10-CM | POA: Diagnosis not present

## 2020-09-29 DIAGNOSIS — Z6831 Body mass index (BMI) 31.0-31.9, adult: Secondary | ICD-10-CM

## 2020-09-29 DIAGNOSIS — F332 Major depressive disorder, recurrent severe without psychotic features: Secondary | ICD-10-CM

## 2020-09-29 DIAGNOSIS — R7303 Prediabetes: Secondary | ICD-10-CM

## 2020-09-29 DIAGNOSIS — Z7689 Persons encountering health services in other specified circumstances: Secondary | ICD-10-CM

## 2020-09-29 DIAGNOSIS — E782 Mixed hyperlipidemia: Secondary | ICD-10-CM

## 2020-09-29 DIAGNOSIS — E6609 Other obesity due to excess calories: Secondary | ICD-10-CM

## 2020-09-29 MED ORDER — VITAMIN D3 50 MCG (2000 UT) PO CAPS: 2000.0000 [IU] | ORAL_CAPSULE | Freq: Every day | ORAL | 1 refills | Status: DC

## 2020-09-29 MED ORDER — LISINOPRIL 20 MG PO TABS: 20.0000 mg | ORAL_TABLET | Freq: Every day | ORAL | 1 refills | Status: DC

## 2020-09-29 MED ORDER — ATORVASTATIN CALCIUM 20 MG PO TABS: 20.0000 mg | ORAL_TABLET | Freq: Every day | ORAL | 3 refills | Status: DC

## 2020-09-29 MED ORDER — HYDROCHLOROTHIAZIDE 25 MG PO TABS
25.0000 mg | ORAL_TABLET | Freq: Every day | ORAL | 1 refills | Status: DC
Start: 2020-09-29 — End: 2020-12-30

## 2020-09-29 NOTE — Patient Instructions (Signed)
High Cholesterol  High cholesterol is a condition in which the blood has high levels of a white, waxy substance similar to fat (cholesterol). The liver makes all the cholesterol that the body needs. The human body needs small amounts of cholesterol to help build cells. A person gets extra or excess cholesterol from the food that he or she eats. The blood carries cholesterol from the liver to the rest of the body. If you have high cholesterol, deposits (plaques) may build up on the walls of your arteries. Arteries are the blood vessels that carry blood away from your heart. These plaques make the arteries narrow and stiff. Cholesterol plaques increase your risk for heart attack and stroke. Work with your health care provider to keep your cholesterol levels in a healthy range. What increases the risk? The following factors may make you more likely to develop this condition:  Eating foods that are high in animal fat (saturated fat) or cholesterol.  Being overweight.  Not getting enough exercise.  A family history of high cholesterol (familial hypercholesterolemia).  Use of tobacco products.  Having diabetes. What are the signs or symptoms? There are no symptoms of this condition. How is this diagnosed? This condition may be diagnosed based on the results of a blood test.  If you are older than 43 years of age, your health care provider may check your cholesterol levels every 4-6 years.  You may be checked more often if you have high cholesterol or other risk factors for heart disease. The blood test for cholesterol measures:  "Bad" cholesterol, or LDL cholesterol. This is the main type of cholesterol that causes heart disease. The desired level is less than 100 mg/dL.  "Good" cholesterol, or HDL cholesterol. HDL helps protect against heart disease by cleaning the arteries and carrying the LDL to the liver for processing. The desired level for HDL is 60 mg/dL or higher.  Triglycerides.  These are fats that your body can store or burn for energy. The desired level is less than 150 mg/dL.  Total cholesterol. This measures the total amount of cholesterol in your blood and includes LDL, HDL, and triglycerides. The desired level is less than 200 mg/dL. How is this treated? This condition may be treated with:  Diet changes. You may be asked to eat foods that have more fiber and less saturated fats or added sugar.  Lifestyle changes. These may include regular exercise, maintaining a healthy weight, and quitting use of tobacco products.  Medicines. These are given when diet and lifestyle changes have not worked. You may be prescribed a statin medicine to help lower your cholesterol levels. Follow these instructions at home: Eating and drinking  Eat a healthy, balanced diet. This diet includes: ? Daily servings of a variety of fresh, frozen, or canned fruits and vegetables. ? Daily servings of whole grain foods that are rich in fiber. ? Foods that are low in saturated fats and trans fats. These include poultry and fish without skin, lean cuts of meat, and low-fat dairy products. ? A variety of fish, especially oily fish that contain omega-3 fatty acids. Aim to eat fish at least 2 times a week.  Avoid foods and drinks that have added sugar.  Use healthy cooking methods, such as roasting, grilling, broiling, baking, poaching, steaming, and stir-frying. Do not fry your food except for stir-frying.   Lifestyle  Get regular exercise. Aim to exercise for a total of 150 minutes a week. Increase your activity level by doing activities   such as gardening, walking, and taking the stairs.  Do not use any products that contain nicotine or tobacco, such as cigarettes, e-cigarettes, and chewing tobacco. If you need help quitting, ask your health care provider.   General instructions  Take over-the-counter and prescription medicines only as told by your health care provider.  Keep all  follow-up visits as told by your health care provider. This is important. Where to find more information  American Heart Association: www.heart.org  National Heart, Lung, and Blood Institute: www.nhlbi.nih.gov Contact a health care provider if:  You have trouble achieving or maintaining a healthy diet or weight.  You are starting an exercise program.  You are unable to stop smoking. Get help right away if:  You have chest pain.  You have trouble breathing.  You have any symptoms of a stroke. "BE FAST" is an easy way to remember the main warning signs of a stroke: ? B - Balance. Signs are dizziness, sudden trouble walking, or loss of balance. ? E - Eyes. Signs are trouble seeing or a sudden change in vision. ? F - Face. Signs are sudden weakness or numbness of the face, or the face or eyelid drooping on one side. ? A - Arms. Signs are weakness or numbness in an arm. This happens suddenly and usually on one side of the body. ? S - Speech. Signs are sudden trouble speaking, slurred speech, or trouble understanding what people say. ? T - Time. Time to call emergency services. Write down what time symptoms started.  You have other signs of a stroke, such as: ? A sudden, severe headache with no known cause. ? Nausea or vomiting. ? Seizure. These symptoms may represent a serious problem that is an emergency. Do not wait to see if the symptoms will go away. Get medical help right away. Call your local emergency services (911 in the [place] Do not drive yourself to the hospital. Summary  Cholesterol plaques increase your risk for heart attack and stroke. Work with your health care provider to keep your cholesterol levels in a healthy range.  Eat a healthy, balanced diet, get regular exercise, and maintain a healthy weight.  Do not use any products that contain nicotine or tobacco, such as cigarettes, e-cigarettes, and chewing tobacco.  Get help right away if you have any symptoms of a  stroke. This information is not intended to replace advice given to you by your health care provider. Make sure you discuss any questions you have with your health care provider. Document Revised: 03/12/2019 Document Reviewed: 03/12/2019 Elsevier Patient Education  2021 Elsevier Inc.  

## 2020-09-29 NOTE — Progress Notes (Signed)
[facility]   Subjective:   [name] is a 45 y.o. female presents for hospital follow up and establish care. Admit date to the hospital was 08/21/20, patient was discharged from the hospital on 08/25/20, patient was admitted for:   Past Medical History:  Diagnosis Date  . Anxiety   . Diabetes mellitus without complication ([place])   . Hypertension      No Known Allergies    Current Outpatient Medications on File Prior to Visit  Medication Sig Dispense Refill  . gabapentin (NEURONTIN) 100 MG capsule Take 1 capsule (100 mg total) by mouth 3 (three) times daily. 90 capsule 5  . hydrochlorothiazide (HYDRODIURIL) 50 MG tablet Take 1 tablet (50 mg total) by mouth daily. 30 tablet 0  . LINZESS 145 MCG CAPS capsule Take 145 mcg by mouth daily as needed (constipation).    Marland Kitchen lisinopril (ZESTRIL) 20 MG tablet Take 1 tablet (20 mg total) by mouth daily. 30 tablet 0  . pantoprazole (PROTONIX) 40 MG tablet Take 1 tablet (40 mg total) by mouth daily. 30 tablet 0  . Vitamin D, Ergocalciferol, (DRISDOL) 1.25 MG (50000 UNIT) CAPS capsule Take 1 capsule (50,000 Units total) by mouth every 7 (seven) days. 5 capsule 0   No current facility-administered medications on file prior to visit.     Review of System: Review of Systems  Constitutional: Positive for weight loss.       Due to medication   Psychiatric/Behavioral: Positive for depression. The patient is nervous/anxious.   All other systems reviewed and are negative.   Objective:  BP 129/88 (BP Location: Right Arm, Patient Position: Sitting, Cuff Size: Normal)   Pulse 82   Temp 98.1 F (36.7 C) (Temporal)   Ht _0  (1.626 m)   Wt 182 lb 3.2 oz (82.6 kg)   SpO2 95%   BMI 31.27 kg/m   Filed Weights   09/29/20 1411  Weight: 182 lb 3.2 oz (82.6 kg)    Physical Exam: General Appearance: Well nourished, obese female  in no apparent distress. Eyes: PERRLA, EOMs, conjunctiva no swelling or erythema Sinuses: No  Frontal/maxillary tenderness ENT/Mouth: Ext aud canals clear, TMs without erythema, bulging. No erythema, swelling, or exudate on post pharynx. Hearing normal.  Neck: Supple, thyroid normal.  Respiratory: Respiratory effort normal, BS equal bilaterally without rales, rhonchi, wheezing or stridor.  Cardio: RRR with no MRGs. Brisk peripheral pulses without edema.  Abdomen: Soft, + BS.  Non tender, no guarding, rebound, hernias, masses. Lymphatics: Non tender without lymphadenopathy.  Musculoskeletal: Full ROM, 5/5 strength, normal gait.  Skin: Warm, dry without rashes, lesions, ecchymosis.  Neuro: Normal muscle tone Psych: Awake and oriented X 3, normal affect, Insight and Judgment appropriate.    Assessment:  Taressa was seen today for hospitalization follow-up.  Diagnoses and all orders for this visit:  Prediabetes A1C 5.9 on no medication at this time previously on  ozempic caused gastroparesis and a 40 lbs weight loss. Agreed to monitor carbs and exercise Decrease foods that are high in carbohydrates are the following rice, potatoes, breads, sugars, and pastas.  Reduction in the intake (eating) will assist in lowering your blood sugars.  Primary hypertension Blood pressure goal of less than 130/80, which is met low-sodium, DASH diet, medication compliance, 150 minutes of moderate intensity exercise per week. Discussed medication compliance, adverse effects.  Vitamin D deficiency Vitamin D is needed to make and keep bones strong. The patient will need to take a prescription strength vitamin D  tablet once weekly until next appointment.  Vitamin D level will be rechecked at future visit.  I have sent the vitamin D tablet to the pharmacy and it should be ready for pick up.  Please remind patient of upcoming appointments and/or schedule for follow-up if needed in 6-8 weeks.  MDD (major depressive disorder), recurrent severe, without psychosis ([place]) Followed by behavior health   Encounter  to establish care Establish care with new provider  Class 1 obesity due to excess calories without serious comorbidity with body mass index (BMI) of 31.0 to 31.9 in adult Obesity is 30-39 indicating an excess in caloric intake or underlining conditions. This may lead to other co-morbidities. Lifestyle modifications of diet and exercise may reduce obesity.   Mixed hyperlipidemia Reviewed previous labs prescribed statin -     atorvastatin (LIPITOR) 20 MG tablet; Take 1 tablet (20 mg total) by mouth daily.   This note has been created with Surveyor, quantity. Any transcriptional errors are unintentional.   Kerin Perna, NP 09/29/2020, 2:20 PM

## 2020-10-03 ENCOUNTER — Other Ambulatory Visit (HOSPITAL_COMMUNITY): Payer: Self-pay | Admitting: Psychiatry

## 2020-10-04 NOTE — Telephone Encounter (Signed)
Prescription was written for 5 capsules. One capsule every 7 days. No refills are required.

## 2020-10-20 ENCOUNTER — Other Ambulatory Visit: Payer: Self-pay | Admitting: Adult Health

## 2020-10-20 DIAGNOSIS — F411 Generalized anxiety disorder: Secondary | ICD-10-CM

## 2020-10-20 NOTE — Telephone Encounter (Signed)
Last filled 08/25/20 appt on 11/23

## 2020-10-21 ENCOUNTER — Telehealth: Payer: Self-pay | Admitting: Adult Health

## 2020-10-21 ENCOUNTER — Other Ambulatory Visit: Payer: Self-pay

## 2020-10-21 MED ORDER — BUSPIRONE HCL 10 MG PO TABS: 10.0000 mg | ORAL_TABLET | Freq: Two times a day (BID) | ORAL | 1 refills | Status: DC

## 2020-10-21 NOTE — Telephone Encounter (Signed)
Pt reports Xanax is the only thing that helps with her anxiety but she will try the Buspar. What dose do you want to start her?   She also reports she is unable to take the gabapentin due to side effects of causing vertigo.

## 2020-10-21 NOTE — Telephone Encounter (Signed)
Please review.You d/c xanax last visit.

## 2020-10-21 NOTE — Telephone Encounter (Signed)
It was discontinued while in the hospital. We can offer Buspar.

## 2020-10-21 NOTE — Telephone Encounter (Signed)
Buspar 10mg  BID to start.

## 2020-10-21 NOTE — Telephone Encounter (Signed)
Pt called and said that the xanax was denied. She said that her anxiety is still bad . She said if you don't want her to take the xanax she would like something else for her anxiety. Please give her a call at [phone]

## 2020-10-21 NOTE — Telephone Encounter (Signed)
Rx sent 

## 2020-10-24 ENCOUNTER — Telehealth: Payer: Self-pay | Admitting: Adult Health

## 2020-10-24 NOTE — Telephone Encounter (Signed)
She is coming in on tuesday

## 2020-10-24 NOTE — Telephone Encounter (Signed)
We can have the staff check my schedule.

## 2020-10-24 NOTE — Telephone Encounter (Signed)
Please see if she can be worked in next week

## 2020-10-24 NOTE — Telephone Encounter (Signed)
Pt left a voice message requesting a doctor's note so she can be out of work. Please give her  a call at [phone] this is a time sensitive

## 2020-10-24 NOTE — Telephone Encounter (Signed)
Pt stated she needs it by next Thursday she is on cancellation list but wants to know if there is any way you can work her in

## 2020-10-24 NOTE — Telephone Encounter (Signed)
Pt stated she needs a note stating she can be off work starting 6/29 until 4 weeks from that date.She stated she is having a hard time with her depression and anxiety.She said they don't need a FMLA since it is just for a leave of absence so a doctors note will do.

## 2020-10-24 NOTE — Telephone Encounter (Signed)
Please add to cancellation list

## 2020-10-24 NOTE — Telephone Encounter (Signed)
She will need to be seen and evaluated before she can be taken out of work.

## 2020-10-24 NOTE — Telephone Encounter (Signed)
LVM with info and for her to return call

## 2020-10-28 ENCOUNTER — Ambulatory Visit (INDEPENDENT_AMBULATORY_CARE_PROVIDER_SITE_OTHER): Payer: 59 | Admitting: Adult Health

## 2020-10-28 ENCOUNTER — Other Ambulatory Visit: Payer: Self-pay

## 2020-10-28 ENCOUNTER — Encounter: Payer: Self-pay | Admitting: Adult Health

## 2020-10-28 DIAGNOSIS — F411 Generalized anxiety disorder: Secondary | ICD-10-CM

## 2020-10-28 DIAGNOSIS — F331 Major depressive disorder, recurrent, moderate: Secondary | ICD-10-CM

## 2020-10-28 DIAGNOSIS — F410 Panic disorder [episodic paroxysmal anxiety] without agoraphobia: Secondary | ICD-10-CM | POA: Diagnosis not present

## 2020-10-28 DIAGNOSIS — F422 Mixed obsessional thoughts and acts: Secondary | ICD-10-CM

## 2020-10-28 DIAGNOSIS — G4700 Insomnia, unspecified: Secondary | ICD-10-CM | POA: Diagnosis not present

## 2020-10-28 MED ORDER — DESVENLAFAXINE SUCCINATE ER 50 MG PO TB24: 50.0000 mg | ORAL_TABLET | Freq: Every day | ORAL | 2 refills | Status: DC

## 2020-10-28 MED ORDER — BUSPIRONE HCL 10 MG PO TABS: ORAL_TABLET | ORAL | 2 refills | Status: DC

## 2020-10-28 NOTE — Progress Notes (Signed)
Gwendolyn Carrillo [id number] 1975/11/18 45 y.o.  Subjective:   Patient ID:  Gwendolyn Carrillo is a 45 y.o. (DOB 08-05-1975) female.  Chief Complaint: No chief complaint on file.   HPI Gwendolyn Carrillo presents to the office today for follow-up of MDD, mixed obsessional thoughts, panic attacks, and GAD.   Describes mood today as "not good". Tearful throughout interview. Pleasant. Mood symptoms - depression, anxiety, and irritability. Increased panic attacks. Stating "I'm not doing good". Isolating. Not wanting to be around people. Brother recently had a massive stroke. Sister has gone blind from diabetes. Stating "I can't catch a break". Stopped taking the Gabapentin - have bad dreams. Has restarted the Prozac at 20mg  daily. Added the Buspar 10mg  twice daily and feels it is helping some. Working for - has been out of work since June 29th. Improved interest and motivation. Taking medications as prescribed. Seeing therapist.  Energy level lower - having to push herself. Active, does not have a regular exercise routine.  Able to enjoy usual interests and activities. Single. Dating. Has 4 children 25, 19, 23, and 15. Family local. Spending time with family. Appetite improved. Weight gain - 185 pounds.  Sleeping difficulties. Laying in the bed for hours. Averages 5 hours during the week - sleeping longer during the week. Focus and concentration difficulties. Completing tasks. Managing some aspects of household. Works full time, but has been out of work since June the 29th.  Denies SI or HI.  Denies AH or VH.   Previous medication trials: Xanax 0.5mg  daily, Lexapro x 1 year - felt tired all the time, Trintellix, any antidepressant.     GAD-7    Flowsheet Row Office Visit from 09/29/2020 in [facility]  Total GAD-7 Score 6      PHQ2-9    Flowsheet Row Office Visit from 09/29/2020 in [facility] ED from 08/20/2020 in  [facility]  PHQ-2 Total Score 5 6  PHQ-9 Total Score 12 17      Flowsheet Row [facility]  C-SSRS RISK CATEGORY High Risk        Review of Systems:  Review of Systems  Musculoskeletal:  Negative for gait problem.  Neurological:  Negative for tremors.  Psychiatric/Behavioral:         Please refer to HPI   Medications: I have reviewed the patient's current medications.  Current Outpatient Medications  Medication Sig Dispense Refill   desvenlafaxine (PRISTIQ) 50 MG 24 hr tablet Take 1 tablet (50 mg total) by mouth daily. 30 tablet 2   atorvastatin (LIPITOR) 20 MG tablet Take 1 tablet (20 mg total) by mouth daily. 90 tablet 3   busPIRone (BUSPAR) 10 MG tablet Take two tablets twice daily. 120 tablet 2   Cholecalciferol (VITAMIN D3) 50 MCG (2000 UT) capsule Take 1 capsule (2,000 Units total) by mouth daily. 90 capsule 1   hydrochlorothiazide (HYDRODIURIL) 25 MG tablet Take 1 tablet (25 mg total) by mouth daily. 90 tablet 1   LINZESS 145 MCG CAPS capsule Take 145 mcg by mouth daily as needed (constipation).     lisinopril (ZESTRIL) 20 MG tablet Take 1 tablet (20 mg total) by mouth daily. 90 tablet 1   pantoprazole (PROTONIX) 40 MG tablet Take 1 tablet (40 mg total) by mouth daily. 30 tablet 0   Vitamin D, Ergocalciferol, (DRISDOL) 1.25 MG (50000 UNIT) CAPS capsule Take 1 capsule (50,000  Units total) by mouth every 7 (seven) days. 5 capsule 0   No current facility-administered medications for this visit.    Medication Side Effects: None  Allergies: No Known Allergies  Past Medical History:  Diagnosis Date   Anxiety    Diabetes mellitus without complication (HCC)    Hypertension     Past Medical History, Surgical history, Social history, and Family history were reviewed and updated as appropriate.   Please see review of systems for further details on the patient's review from today.    Objective:   Physical Exam:  There were no vitals taken for this visit.  Physical Exam Constitutional:      General: She is not in acute distress. Musculoskeletal:        General: No deformity.  Neurological:     Mental Status: She is alert and oriented to person, place, and time.     Coordination: Coordination normal.  Psychiatric:        Attention and Perception: Attention and perception normal. She does not perceive auditory or visual hallucinations.        Mood and Affect: Mood normal. Mood is not anxious or depressed. Affect is not labile, blunt, angry or inappropriate.        Speech: Speech normal.        Behavior: Behavior normal.        Thought Content: Thought content normal. Thought content is not paranoid or delusional. Thought content does not include homicidal or suicidal ideation. Thought content does not include homicidal or suicidal plan.        Cognition and Memory: Cognition and memory normal.        Judgment: Judgment normal.     Comments: Insight intact    Lab Review:     Component Value Date/Time   NA 137 08/25/2020 0626   K 3.9 08/25/2020 0626   CL 101 08/25/2020 0626   CO2 28 08/25/2020 0626   GLUCOSE 144 (H) 08/25/2020 0626   BUN 24 (H) 08/25/2020 0626   CREATININE 0.77 08/25/2020 0626   CALCIUM 9.7 08/25/2020 0626   PROT 7.3 08/20/2020 1037   ALBUMIN 3.8 08/20/2020 1037   AST 35 08/20/2020 1037   ALT 34 08/20/2020 1037   ALKPHOS 52 08/20/2020 1037   BILITOT 0.3 08/20/2020 1037   GFRNONAA >60 08/25/2020 0626   GFRAA >60 10/10/2019 1441       Component Value Date/Time   WBC 8.3 08/20/2020 1037   RBC 4.13 08/20/2020 1037   HGB 13.5 08/20/2020 1037   HCT 38.7 08/20/2020 1037   PLT 266 08/20/2020 1037   MCV 93.7 08/20/2020 1037   MCH 32.7 08/20/2020 1037   MCHC 34.9 08/20/2020 1037   RDW 12.4 08/20/2020 1037   LYMPHSABS 1.4 08/20/2020 1037   MONOABS 0.6 08/20/2020 1037   EOSABS 0.1 08/20/2020 1037   BASOSABS 0.0 08/20/2020 1037     No results found for: POCLITH, LITHIUM   No results found for: PHENYTOIN, PHENOBARB, VALPROATE, CBMZ   .res Assessment: Plan:     Plan:  PDMP reviewed  1. D/C Xanax 1mg  BID - recent hospitalization ETOH and THC 2. D/C Gabapentin 100mg  TID - weird dreams 3. Restarted Prozac 20mg  daily - 2 weeks ago 4. Buspar 10mg  BID  Consider low dose of Abilify  Continue therapy - [facility] - therapist.  RTC 3 months  Patien weeks advised to contact office with any questions, adverse effects, or acute worsening in signs and symptoms.  Discussed potential  benefits, risk, and side effects of benzodiazepines to include potential risk of tolerance and dependence, as well as possible drowsiness.  Advised patient not to drive if experiencing drowsiness and to take lowest possible effective dose to minimize risk of dependence and tolerance.     Diagnoses and all orders for this visit:  Major depressive disorder, recurrent episode, moderate (HCC) -     desvenlafaxine (PRISTIQ) 50 MG 24 hr tablet; Take 1 tablet (50 mg total) by mouth daily.  Insomnia, unspecified type  Mixed obsessional thoughts and acts -     desvenlafaxine (PRISTIQ) 50 MG 24 hr tablet; Take 1 tablet (50 mg total) by mouth daily.  Panic attacks -     busPIRone (BUSPAR) 10 MG tablet; Take two tablets twice daily.  Generalized anxiety disorder -     busPIRone (BUSPAR) 10 MG tablet; Take two tablets twice daily. -     desvenlafaxine (PRISTIQ) 50 MG 24 hr tablet; Take 1 tablet (50 mg total) by mouth daily.    Please see After Visit Summary for patient specific instructions.  Future Appointments  Date Time Provider Department Center  12/30/2020  8:30 AM Grayce Sessions, NP [facility] None  03/18/2021 10:00 AM Deona Novitski, Thereasa Solo, NP [facility] None    No orders of the defined types were placed in this encounter.   -------------------------------

## 2020-10-30 ENCOUNTER — Telehealth: Payer: Self-pay | Admitting: Adult Health

## 2020-10-30 NOTE — Telephone Encounter (Signed)
Received Mental Health Attending Physician's Statement Form. Placed on Traci's desk 10/30/20

## 2020-11-04 DIAGNOSIS — Z0289 Encounter for other administrative examinations: Secondary | ICD-10-CM

## 2020-11-05 NOTE — Telephone Encounter (Signed)
Were you able to complete this one yet?

## 2020-11-05 NOTE — Telephone Encounter (Signed)
noted 

## 2020-11-05 NOTE — Telephone Encounter (Signed)
Gwendolyn Carrillo called to check status of this form.  She was told it needed to be faxed in by 11/04/20.  Has this been done yet.  Please let her know status.

## 2020-11-05 NOTE — Telephone Encounter (Signed)
Faxed completed paperwork to [facility] 11/05/20 fax # [phone].

## 2020-11-21 ENCOUNTER — Encounter: Payer: Self-pay | Admitting: Adult Health

## 2020-11-21 ENCOUNTER — Other Ambulatory Visit: Payer: Self-pay

## 2020-11-21 ENCOUNTER — Ambulatory Visit (INDEPENDENT_AMBULATORY_CARE_PROVIDER_SITE_OTHER): Payer: 59 | Admitting: Adult Health

## 2020-11-21 DIAGNOSIS — F411 Generalized anxiety disorder: Secondary | ICD-10-CM | POA: Diagnosis not present

## 2020-11-21 DIAGNOSIS — F410 Panic disorder [episodic paroxysmal anxiety] without agoraphobia: Secondary | ICD-10-CM | POA: Diagnosis not present

## 2020-11-21 DIAGNOSIS — F331 Major depressive disorder, recurrent, moderate: Secondary | ICD-10-CM | POA: Diagnosis not present

## 2020-11-21 DIAGNOSIS — G4700 Insomnia, unspecified: Secondary | ICD-10-CM

## 2020-11-21 MED ORDER — HYDROXYZINE PAMOATE 50 MG PO CAPS: 50.0000 mg | ORAL_CAPSULE | Freq: Three times a day (TID) | ORAL | 2 refills | Status: DC | PRN

## 2020-11-21 MED ORDER — BUSPIRONE HCL 10 MG PO TABS: ORAL_TABLET | ORAL | 2 refills | Status: DC

## 2020-11-21 NOTE — Progress Notes (Signed)
Gwendolyn Carrillo [id number] January 13, 1976 45 y.o.  Subjective:   Patient ID:  Gwendolyn Carrillo is a 45 y.o. (DOB 05/31/1975) female.  Chief Complaint: No chief complaint on file.   HPI Gwendolyn Carrillo presents to the office today for follow-up of MDD, mixed obsessional thoughts, panic attacks, and GAD.   Describes mood today as "a little better". Decreased tearfulness. Pleasant. Mood symptoms - reports decreased depression, anxiety, and irritability. More anxious overall - "body and brain trying to find something for me to be anxious about". Stating "I'm trying to get a grip". Decreased panic attacks. Medications not agreeing with her. Stating "I'm starting to improve". Body is feeling better - started probiotics. Still not wanting to get out as much. Brother improving from stroke. Sister having surgery to improve her vision. Feels like Buspar has helped. Working for [employer] - has been out of work since June 29th. Improved interest and motivation. Taking medications as prescribed. Seeing therapist.  Energy levels improved. Active, does not have a regular exercise routine.  Able to enjoy usual interests and activities. Single. Dating. Has 4 children 25, 19, 23, and 15. Family local. Spending time with family. Appetite improved. Weight gain - 185 pounds.  Sleeping difficulties - "up and down". Averages 5 hours. Focus and concentration - "a little better". Completing tasks. Managing some aspects of household. Works full time, but has been out of work since June the 29th. Does not feel like she is ready to return to work. Feels like she is making "progress". Denies SI or HI.  Denies AH or VH.   Previous medication trials: Xanax 0.5mg  daily, Lexapro x 1 year - felt tired all the time, Trintellix, any antidepressant.    GAD-7    Flowsheet Row Office Visit from 09/29/2020 in [facility]  Total GAD-7 Score 6      PHQ2-9    Flowsheet Row Office  Visit from 09/29/2020 in [facility] ED from [facility]/20/2020 in [facility]  PHQ-2 Total Score 5 6  PHQ-9 Total Score 12 17      Flowsheet Row ED from 08/20/2020 in [facility]  C-SSRS RISK CATEGORY High Risk        Review of Systems:  Review of Systems  Musculoskeletal:  Negative for gait problem.  Neurological:  Negative for tremors.  Psychiatric/Behavioral:         Please refer to HPI   Medications: I have reviewed the patient's current medications.  Current Outpatient Medications  Medication Sig Dispense Refill   hydrOXYzine (VISTARIL) 50 MG capsule Take 1 capsule (50 mg total) by mouth 3 (three) times daily as needed. 30 capsule 2   atorvastatin (LIPITOR) 20 MG tablet Take 1 tablet (20 mg total) by mouth daily. 90 tablet 3   busPIRone (BUSPAR) 10 MG tablet Take two tablets twice daily. 120 tablet 2   Cholecalciferol (VITAMIN D3) 50 MCG (2000 UT) capsule Take 1 capsule (2,000 Units total) by mouth daily. 90 capsule 1   hydrochlorothiazide (HYDRODIURIL) 25 MG tablet Take 1 tablet (25 mg total) by mouth daily. 90 tablet 1   LINZESS 145 MCG CAPS capsule Take 145 mcg by mouth daily as needed (constipation).     lisinopril (ZESTRIL) 20 MG tablet Take 1 tablet (20 mg total) by mouth daily. 90 tablet 1   pantoprazole (PROTONIX) 40 MG tablet Take 1 tablet (40 mg total) by mouth daily. 30 tablet 0  Vitamin D, Ergocalciferol, (DRISDOL) 1.25 MG (50000 UNIT) CAPS capsule Take 1 capsule (50,000 Units total) by mouth every 7 (seven) days. 5 capsule 0   No current facility-administered medications for this visit.    Medication Side Effects: None  Allergies: No Known Allergies  Past Medical History:  Diagnosis Date   Anxiety    Diabetes mellitus without complication (HCC)    Hypertension     Past Medical History, Surgical history, Social history, and Family history were reviewed and updated as  appropriate.   Please see review of systems for further details on the patient's review from today.   Objective:   Physical Exam:  There were no vitals taken for this visit.  Physical Exam Constitutional:      General: She is not in acute distress. Musculoskeletal:        General: No deformity.  Neurological:     Mental Status: She is alert and oriented to person, place, and time.     Coordination: Coordination normal.  Psychiatric:        Attention and Perception: Attention and perception normal. She does not perceive auditory or visual hallucinations.        Mood and Affect: Mood normal. Mood is not anxious or depressed. Affect is not labile, blunt, angry or inappropriate.        Speech: Speech normal.        Behavior: Behavior normal.        Thought Content: Thought content normal. Thought content is not paranoid or delusional. Thought content does not include homicidal or suicidal ideation. Thought content does not include homicidal or suicidal plan.        Cognition and Memory: Cognition and memory normal.        Judgment: Judgment normal.     Comments: Insight intact    Lab Review:     Component Value Date/Time   NA 137 08/25/2020 0626   K 3.9 08/25/2020 0626   CL 101 08/25/2020 0626   CO2 28 08/25/2020 0626   GLUCOSE 144 (H) 08/25/2020 0626   BUN 24 (H) 08/25/2020 0626   CREATININE 0.77 08/25/2020 0626   CALCIUM 9.7 08/25/2020 0626   PROT 7.3 08/20/2020 1037   ALBUMIN 3.8 08/20/2020 1037   AST 35 08/20/2020 1037   ALT 34 08/20/2020 1037   ALKPHOS 52 08/20/2020 1037   BILITOT 0.3 08/20/2020 1037   GFRNONAA >60 08/25/2020 0626   GFRAA >60 10/10/2019 1441       Component Value Date/Time   WBC 8.3 08/20/2020 1037   RBC 4.13 08/20/2020 1037   HGB 13.5 08/20/2020 1037   HCT 38.7 08/20/2020 1037   PLT 266 08/20/2020 1037   MCV 93.7 08/20/2020 1037   MCH 32.7 08/20/2020 1037   MCHC 34.9 08/20/2020 1037   RDW 12.4 08/20/2020 1037   LYMPHSABS 1.4 08/20/2020  1037   MONOABS 0.6 08/20/2020 1037   EOSABS 0.1 08/20/2020 1037   BASOSABS 0.0 08/20/2020 1037    No results found for: POCLITH, LITHIUM   No results found for: PHENYTOIN, PHENOBARB, VALPROATE, CBMZ   .res Assessment: Plan:     Plan:  PDMP reviewed  Buspar 20mg  BID Add Hydroxyzine 50mg  at hs  Continue therapy - [facility] - therapist.  Out of work 11/22/2019 through the 12/23/2019 with plans to return on 12/23/2020.  RTC 3 months  Patien weeks advised to contact office with any questions, adverse effects, or acute worsening in signs and symptoms.  Discussed potential benefits, risk,  and side effects of benzodiazepines to include potential risk of tolerance and dependence, as well as possible drowsiness.  Advised patient not to drive if experiencing drowsiness and to take lowest possible effective dose to minimize risk of dependence and tolerance.    Diagnoses and all orders for this visit:  Insomnia, unspecified type  Panic attacks -     busPIRone (BUSPAR) 10 MG tablet; Take two tablets twice daily.  Generalized anxiety disorder -     busPIRone (BUSPAR) 10 MG tablet; Take two tablets twice daily. -     hydrOXYzine (VISTARIL) 50 MG capsule; Take 1 capsule (50 mg total) by mouth 3 (three) times daily as needed.  Major depressive disorder, recurrent episode, moderate (HCC)    Please see After Visit Summary for patient specific instructions.  Future Appointments  Date Time Provider Department Center  12/30/2020  8:30 AM Grayce Sessions, NP [facility] None  03/18/2021 10:00 AM Clent Damore, Thereasa Solo, NP [facility] None    No orders of the defined types were placed in this encounter.   -------------------------------

## 2020-11-26 ENCOUNTER — Telehealth: Payer: Self-pay | Admitting: Adult Health

## 2020-11-26 NOTE — Telephone Encounter (Signed)
Received Mental Health Attending Physician's Statement form. Placed on Traci's desk 8/3.

## 2020-12-03 ENCOUNTER — Telehealth: Payer: Self-pay | Admitting: Adult Health

## 2020-12-03 NOTE — Telephone Encounter (Signed)
Received fax from [facility] requesting completion of a Leave Request Form on Kenishia Plack. Placed on Traci's desk 12/03/20.

## 2020-12-15 ENCOUNTER — Ambulatory Visit: Payer: 59 | Admitting: Adult Health

## 2020-12-15 NOTE — Telephone Encounter (Signed)
completed

## 2020-12-24 ENCOUNTER — Encounter (HOSPITAL_COMMUNITY): Payer: Self-pay

## 2020-12-24 ENCOUNTER — Ambulatory Visit (INDEPENDENT_AMBULATORY_CARE_PROVIDER_SITE_OTHER): Payer: Self-pay | Admitting: *Deleted

## 2020-12-24 ENCOUNTER — Other Ambulatory Visit: Payer: Self-pay

## 2020-12-24 ENCOUNTER — Emergency Department (HOSPITAL_COMMUNITY)
Admission: EM | Admit: 2020-12-24 | Discharge: 2020-12-24 | Disposition: A | Discharge status: Home / Self Care | Payer: 59 | Attending: Emergency Medicine | Admitting: Emergency Medicine

## 2020-12-24 DIAGNOSIS — Z5321 Procedure and treatment not carried out due to patient leaving prior to being seen by health care provider: Secondary | ICD-10-CM | POA: Insufficient documentation

## 2020-12-24 DIAGNOSIS — R110 Nausea: Secondary | ICD-10-CM | POA: Insufficient documentation

## 2020-12-24 DIAGNOSIS — R531 Weakness: Secondary | ICD-10-CM | POA: Diagnosis present

## 2020-12-24 LAB — I-STAT BETA HCG BLOOD, ED (MC, WL, AP ONLY): I-stat hCG, quantitative: <5 m[IU]/mL (ref <5)

## 2020-12-24 LAB — BASIC METABOLIC PANEL
Anion gap: 12 (ref 5–15)
BUN: 17 mg/dL (ref 6–20)
CO2: 23 mmol/L (ref 22–32)
Calcium: 9.3 mg/dL (ref 8.9–10.3)
Chloride: 99 mmol/L (ref 98–111)
Creatinine, Ser: 0.94 mg/dL (ref 0.44–1.00)
GFR, Estimated: >60 mL/min (ref >60)
Glucose, Bld: 295 mg/dL — ABNORMAL HIGH (ref 70–99)
Potassium: 3.8 mmol/L (ref 3.5–5.1)
Sodium: 134 mmol/L — ABNORMAL LOW (ref 135–145)

## 2020-12-24 LAB — CBC
HCT: 37.7 % (ref 36.0–46.0)
Hemoglobin: 13.1 g/dL (ref 12.0–15.0)
MCH: 31.8 pg (ref 26.0–34.0)
MCHC: 34.7 g/dL (ref 30.0–36.0)
MCV: 91.5 fL (ref 80.0–100.0)
Platelets: 265 10*3/uL (ref 150–400)
RBC: 4.12 MIL/uL (ref 3.87–5.11)
RDW: 11.5 % (ref 11.5–15.5)
WBC: 10.9 10*3/uL — ABNORMAL HIGH (ref 4.0–10.5)
nRBC: 0 % (ref 0.0–0.2)

## 2020-12-24 LAB — CBG MONITORING, ED: Glucose-Capillary: 319 mg/dL — ABNORMAL HIGH (ref 70–99)

## 2020-12-24 NOTE — ED Provider Notes (Signed)
Patient left the ER prior to getting placed in a patient room and prior to any evaluation by me.  I did not physically evaluate or examine the patient.   Rozelle Logan, DO 12/24/20 2104

## 2020-12-24 NOTE — ED Triage Notes (Signed)
Pt states she has not been checking her BG. Pt is diabetic and states her A1c was stable at 5. Pt states her BG was 330 last night. Pt states her fasting this morning was 338 and BG was over 400 before entering ED today. Pt states she is having some nausea and dizziness.

## 2020-12-24 NOTE — ED Notes (Signed)
Pt eloped. Pt did not inform staff she was leaving.

## 2020-12-24 NOTE — Telephone Encounter (Signed)
Patient is calling to report she is getting elevated glucose reading this morning. Patient states she had been taken off her glucose medications because she was doing so well. Patient states she has had recent health problems and she wasn't feeling well- so she checked her glucose level- she is fasting with 333 reading. Call to office- no appointment available- advised UC.

## 2020-12-24 NOTE — ED Provider Notes (Signed)
Emergency Medicine Provider Triage Evaluation Note  Gwendolyn Carrillo , a 45 y.o. female  was evaluated in triage.  Pt complains of hyperglycemia.  Patient has a history of diabetes and noted her blood sugar to be in the 300s last night and fasting this morning at 340.  Used to be on glipizide and Ozempic but got gastroparesis.  Has not taken anything for her blood sugar currently.  Review of Systems  Positive: Nausea, weakness Negative: Dizzy, syncope  Physical Exam  BP (!) 136/96   Pulse 87   Temp 98.3 F (36.8 C) (Oral)   Resp 18   SpO2 99%  Gen:   Awake, no distress   Resp:  Normal effort  MSK:   Moves extremities without difficulty  Other:    Medical Decision Making  Medically screening exam initiated at 3:27 PM.  Appropriate orders placed.  Gwendolyn Carrillo was informed that the remainder of the evaluation will be completed by another provider, this initial triage assessment does not replace that evaluation, and the importance of remaining in the ED until their evaluation is complete.     Woodroe Chen 12/24/20 1547    Cheryll Cockayne, MD 12/29/20 [phone]

## 2020-12-24 NOTE — ED Notes (Signed)
Attempted CBG, pt. No answer.

## 2020-12-24 NOTE — Telephone Encounter (Signed)
Reason for Disposition  [1] Blood glucose > 300 mg/dL (34.1 mmol/L) AND [9] two or more times in a row  Answer Assessment - Initial Assessment Questions 1. BLOOD GLUCOSE: "What is your blood glucose level?"      333- fasting 2. ONSET: "When did you check the blood glucose?"     9:45 3. USUAL RANGE: "What is your glucose level usually?" (e.g., usual fasting morning value, usual evening value)     Not checking 4. KETONES: "Do you check for ketones (urine or blood test strips)?" If yes, ask: "What does the test show now?"      N/a 5. TYPE 1 or 2:  "Do you know what type of diabetes you have?"  (e.g., Type 1, Type 2, Gestational; doesn't know)      Type 2 6. INSULIN: "Do you take insulin?" "What type of insulin(s) do you use? What is the mode of delivery? (syringe, pen; injection or pump)?"      no 7. DIABETES PILLS: "Do you take any pills for your diabetes?" If yes, ask: "Have you missed taking any pills recently?"     no 8. OTHER SYMPTOMS: "Do you have any symptoms?" (e.g., fever, frequent urination, difficulty breathing, dizziness, weakness, vomiting)     Fatigue, nausea 9. PREGNANCY: "Is there any chance you are pregnant?" "When was your last menstrual period?"     No- LMP- spotting monthly  Protocols used: Diabetes - High Blood Sugar-A-AH

## 2020-12-30 ENCOUNTER — Ambulatory Visit (INDEPENDENT_AMBULATORY_CARE_PROVIDER_SITE_OTHER): Payer: 59 | Admitting: Primary Care

## 2020-12-30 ENCOUNTER — Encounter (INDEPENDENT_AMBULATORY_CARE_PROVIDER_SITE_OTHER): Payer: Self-pay | Admitting: Primary Care

## 2020-12-30 ENCOUNTER — Other Ambulatory Visit: Payer: Self-pay

## 2020-12-30 VITALS — BP 112/78 | HR 96 | Temp 97.7°F | Ht 64.0 in | Wt 191.4 lb

## 2020-12-30 DIAGNOSIS — Z23 Encounter for immunization: Secondary | ICD-10-CM | POA: Diagnosis not present

## 2020-12-30 DIAGNOSIS — E119 Type 2 diabetes mellitus without complications: Secondary | ICD-10-CM

## 2020-12-30 DIAGNOSIS — E6609 Other obesity due to excess calories: Secondary | ICD-10-CM

## 2020-12-30 DIAGNOSIS — E782 Mixed hyperlipidemia: Secondary | ICD-10-CM | POA: Diagnosis not present

## 2020-12-30 DIAGNOSIS — K219 Gastro-esophageal reflux disease without esophagitis: Secondary | ICD-10-CM

## 2020-12-30 DIAGNOSIS — I10 Essential (primary) hypertension: Secondary | ICD-10-CM | POA: Diagnosis not present

## 2020-12-30 DIAGNOSIS — Z6831 Body mass index (BMI) 31.0-31.9, adult: Secondary | ICD-10-CM

## 2020-12-30 LAB — POCT GLYCOSYLATED HEMOGLOBIN (HGB A1C): Hemoglobin A1C: 8.6 % — AB (ref 4.0–5.6)

## 2020-12-30 MED ORDER — LISINOPRIL 20 MG PO TABS: 20.0000 mg | ORAL_TABLET | Freq: Every day | ORAL | 1 refills | Status: DC

## 2020-12-30 MED ORDER — EMPAGLIFLOZIN 25 MG PO TABS: 25.0000 mg | ORAL_TABLET | Freq: Every day | ORAL | 1 refills | Status: DC

## 2020-12-30 MED ORDER — HYDROCHLOROTHIAZIDE 25 MG PO TABS: 25.0000 mg | ORAL_TABLET | Freq: Every day | ORAL | 1 refills | Status: DC

## 2020-12-30 MED ORDER — BLOOD GLUCOSE METER KIT: PACK | 0 refills | Status: DC

## 2020-12-30 MED ORDER — PANTOPRAZOLE SODIUM 40 MG PO TBEC: 40.0000 mg | DELAYED_RELEASE_TABLET | Freq: Every day | ORAL | 0 refills | Status: DC

## 2020-12-30 NOTE — Patient Instructions (Signed)
Why should you not take PPIs long-term? Long-term PPI use has been associated with an increased risk of several adverse outcomes in observational studies, including bone fractures, vitamin B12, magnesium or iron deficiency, Clostridium difficile infection and community-acquired pneumonia.

## 2020-12-30 NOTE — Progress Notes (Signed)
[facility]   Subjective:   Gwendolyn Carrillo is a 45 y.o. female presents for ED f/u and management of HTN. Patient presented to the Emergency room with concerns of blood sugar to be in the 300s last night and fasting this morning at 340.  Not taking dm medication because she developed gastroparesis . She left before being examine. Today she admits to polyuria, decrease in hunger and increase in polydipsia.  Past Medical History:  Diagnosis Date   Anxiety    Diabetes mellitus without complication ([place])    Hypertension      No Known Allergies    Current Outpatient Medications on File Prior to Visit  Medication Sig Dispense Refill   atorvastatin (LIPITOR) 20 MG tablet Take 1 tablet (20 mg total) by mouth daily. 90 tablet 3   busPIRone (BUSPAR) 10 MG tablet Take two tablets twice daily. 120 tablet 2   Cholecalciferol (VITAMIN D3) 50 MCG (2000 UT) capsule Take 1 capsule (2,000 Units total) by mouth daily. 90 capsule 1   hydrochlorothiazide (HYDRODIURIL) 25 MG tablet Take 1 tablet (25 mg total) by mouth daily. 90 tablet 1   hydrOXYzine (VISTARIL) 50 MG capsule Take 1 capsule (50 mg total) by mouth 3 (three) times daily as needed. 30 capsule 2   LINZESS 145 MCG CAPS capsule Take 145 mcg by mouth daily as needed (constipation).     lisinopril (ZESTRIL) 20 MG tablet Take 1 tablet (20 mg total) by mouth daily. 90 tablet 1   pantoprazole (PROTONIX) 40 MG tablet Take 1 tablet (40 mg total) by mouth daily. 30 tablet 0   Vitamin D, Ergocalciferol, (DRISDOL) 1.25 MG (50000 UNIT) CAPS capsule Take 1 capsule (50,000 Units total) by mouth every 7 (seven) days. 5 capsule 0   No current facility-administered medications on file prior to visit.     Review of System: Review of Systems  Constitutional:  Positive for malaise/fatigue.  Gastrointestinal:  Positive for nausea.  Genitourinary:  Positive for frequency.  Endo/Heme/Allergies:  Positive for polydipsia.  All other systems  reviewed and are negative.  Objective:  BP 112/78 (BP Location: Right Arm, Patient Position: Sitting, Cuff Size: Normal)   Pulse 96   Temp 97.7 F (36.5 C) (Temporal)   Ht '5\' 4"'  (1.626 m)   Wt 191 lb 6.4 oz (86.8 kg)   SpO2 94%   BMI 32.85 kg/m   Physical Exam: General Appearance: Well nourished,obese female  in no apparent distress. Eyes: PERRLA, EOMs, conjunctiva no swelling or erythema Sinuses: No Frontal/maxillary tenderness ENT/Mouth: Ext aud canals clear, TMs without erythema, bulging. Hearing normal.  Neck: Supple, thyroid normal.  Respiratory: Respiratory effort normal, BS equal bilaterally without rales, rhonchi, wheezing or stridor.  Cardio: RRR with no MRGs. Brisk peripheral pulses without edema.  Abdomen: Soft, + BS.  Non tender, no guarding, rebound, hernias, masses. Lymphatics: Non tender without lymphadenopathy.  Musculoskeletal: Full ROM, 5/5 strength, normal gait.  Skin: Warm, dry without rashes, lesions, ecchymosis.  Neuro: Normal muscle tone, no cerebellar symptoms. Sensation intact.  Psych: Awake and oriented X 3, normal affect, Insight and Judgment appropriate.    Assessment:  Brocha was seen today for blood pressure check.  Diagnoses and all orders for this visit:  Primary hypertension Blood pressure is at goal of less than 130/80, on dual therapy .low-sodium, DASH diet, medication compliance, 150 minutes of moderate intensity exercise per week. Discussed medication compliance, adverse effects.  -     hydrochlorothiazide (HYDRODIURIL) 25 MG tablet; Take 1  tablet (25 mg total) by mouth daily. -     lisinopril (ZESTRIL) 20 MG tablet; Take 1 tablet (20 mg total) by mouth daily.  Class 1 obesity due to excess calories without serious comorbidity with body mass index (BMI) of 31.0 to 31.9 in adult Obesity is 30-39 indicating an excess in caloric intake or underlining conditions. This may lead to other co-morbidities. Lifestyle modifications of diet and  exercise may reduce obesity.    Mixed hyperlipidemia  Healthy lifestyle diet of fruits vegetables fish nuts whole grains and low saturated fat . Foods high in cholesterol or liver, fatty meats,cheese, butter avocados, nuts and seeds, chocolate and fried foods. Lipids    Type 2 diabetes mellitus without complication, without long-term current use of insulin (HCC) Hx of T2D stop medication due to GI upset. ADA recommends the following therapeutic goals for glycemic control related to A1c measurements: Goal of therapy: Less than 6.5 hemoglobin A1c.  Reduce foods high in carbohydrates are the following rice, potatoes, breads, sugars, and pastas.  Reduction in the intake (eating) will assist in lowering your blood sugars.  Start Jardiance 25 mg daily may need more coverage GLP-1 cause n/v, weakness and GI upset. Start with monotherapy f/u with Clinical pharmacist for tolerance and evaluation. -     HgB A1c.8.6 -     empagliflozin (JARDIANCE) 25 MG TABS tablet; Take 1 tablet (25 mg total) by mouth daily before breakfast.  Gastroesophageal reflux disease without esophagitis Discussed eating small frequent meal, reduction in acidic foods, fried foods ,spicy foods, alcohol caffeine and tobacco and certain medications. Avoid laying down after eating 48mns-1hour, elevated head of the bed. Discussed use prn. Note AVS -     pantoprazole (PROTONIX) 40 MG tablet; Take 1 tablet (40 mg total) by mouth daily.  Need for immunization against influenza -     Flu Vaccine QUAD 658moM (Fluarix, Fluzone & Alfiuria Quad PF)  Other orders -     blood glucose meter kit and supplies; Dispense based on patient and insurance preference. Use up to four times daily as directed. (FOR ICD-10 E10.9, E11.9).     This note has been created with [employer]veyor, quantityAny transcriptional errors are unintentional.   MiKerin PernaNP 12/30/2020, 8:38 AM

## 2021-01-05 ENCOUNTER — Emergency Department (HOSPITAL_BASED_OUTPATIENT_CLINIC_OR_DEPARTMENT_OTHER)
Admission: EM | Admit: 2021-01-05 | Discharge: 2021-01-05 | Disposition: A | Discharge status: Home / Self Care | Payer: 59 | Attending: Emergency Medicine | Admitting: Emergency Medicine

## 2021-01-05 ENCOUNTER — Other Ambulatory Visit: Payer: Self-pay

## 2021-01-05 ENCOUNTER — Ambulatory Visit (INDEPENDENT_AMBULATORY_CARE_PROVIDER_SITE_OTHER): Payer: Self-pay | Admitting: *Deleted

## 2021-01-05 ENCOUNTER — Ambulatory Visit (INDEPENDENT_AMBULATORY_CARE_PROVIDER_SITE_OTHER): Payer: Self-pay

## 2021-01-05 ENCOUNTER — Encounter (HOSPITAL_BASED_OUTPATIENT_CLINIC_OR_DEPARTMENT_OTHER): Payer: Self-pay | Admitting: *Deleted

## 2021-01-05 DIAGNOSIS — Z79899 Other long term (current) drug therapy: Secondary | ICD-10-CM | POA: Insufficient documentation

## 2021-01-05 DIAGNOSIS — Z7984 Long term (current) use of oral hypoglycemic drugs: Secondary | ICD-10-CM | POA: Diagnosis not present

## 2021-01-05 DIAGNOSIS — I10 Essential (primary) hypertension: Secondary | ICD-10-CM | POA: Insufficient documentation

## 2021-01-05 DIAGNOSIS — E1165 Type 2 diabetes mellitus with hyperglycemia: Secondary | ICD-10-CM | POA: Diagnosis not present

## 2021-01-05 DIAGNOSIS — R112 Nausea with vomiting, unspecified: Secondary | ICD-10-CM | POA: Diagnosis present

## 2021-01-05 DIAGNOSIS — F17290 Nicotine dependence, other tobacco product, uncomplicated: Secondary | ICD-10-CM | POA: Diagnosis not present

## 2021-01-05 DIAGNOSIS — R739 Hyperglycemia, unspecified: Secondary | ICD-10-CM

## 2021-01-05 LAB — HEPATIC FUNCTION PANEL
ALT: 40 U/L (ref 0–44)
AST: 18 U/L (ref 15–41)
Albumin: 4.6 g/dL (ref 3.5–5.0)
Alkaline Phosphatase: 61 U/L (ref 38–126)
Bilirubin, Direct: <0.1 mg/dL (ref 0.0–0.2)
Total Bilirubin: 0.5 mg/dL (ref 0.3–1.2)
Total Protein: 8.2 g/dL — ABNORMAL HIGH (ref 6.5–8.1)

## 2021-01-05 LAB — URINALYSIS, ROUTINE W REFLEX MICROSCOPIC
Bilirubin Urine: NEGATIVE
Glucose, UA: >1000 mg/dL — AB
Hgb urine dipstick: NEGATIVE
Leukocytes,Ua: NEGATIVE
Nitrite: NEGATIVE
Specific Gravity, Urine: 1.035 — ABNORMAL HIGH (ref 1.005–1.030)
pH: 6 (ref 5.0–8.0)

## 2021-01-05 LAB — CBC
HCT: 37 % (ref 36.0–46.0)
Hemoglobin: 13.4 g/dL (ref 12.0–15.0)
MCH: 32.1 pg (ref 26.0–34.0)
MCHC: 36.2 g/dL — ABNORMAL HIGH (ref 30.0–36.0)
MCV: 88.7 fL (ref 80.0–100.0)
Platelets: 225 10*3/uL (ref 150–400)
RBC: 4.17 MIL/uL (ref 3.87–5.11)
RDW: 11 % — ABNORMAL LOW (ref 11.5–15.5)
WBC: 12.2 10*3/uL — ABNORMAL HIGH (ref 4.0–10.5)
nRBC: 0 % (ref 0.0–0.2)

## 2021-01-05 LAB — CBG MONITORING, ED
Glucose-Capillary: 257 mg/dL — ABNORMAL HIGH (ref 70–99)
Glucose-Capillary: 196 mg/dL — ABNORMAL HIGH (ref 70–99)

## 2021-01-05 LAB — BASIC METABOLIC PANEL
Anion gap: 11 (ref 5–15)
BUN: 16 mg/dL (ref 6–20)
CO2: 27 mmol/L (ref 22–32)
Calcium: 9.7 mg/dL (ref 8.9–10.3)
Chloride: 95 mmol/L — ABNORMAL LOW (ref 98–111)
Creatinine, Ser: 0.85 mg/dL (ref 0.44–1.00)
GFR, Estimated: >60 mL/min (ref >60)
Glucose, Bld: 237 mg/dL — ABNORMAL HIGH (ref 70–99)
Potassium: 3.4 mmol/L — ABNORMAL LOW (ref 3.5–5.1)
Sodium: 133 mmol/L — ABNORMAL LOW (ref 135–145)

## 2021-01-05 LAB — PREGNANCY, URINE: Preg Test, Ur: NEGATIVE

## 2021-01-05 LAB — LIPASE, BLOOD: Lipase: 33 U/L (ref 11–51)

## 2021-01-05 MED ORDER — METOCLOPRAMIDE HCL 5 MG/ML IJ SOLN
5.0000 mg | Freq: Once | INTRAMUSCULAR | Status: AC
  Administered 2021-01-05: 5 mg via INTRAVENOUS
  Filled 2021-01-05: qty 2

## 2021-01-05 MED ORDER — SODIUM CHLORIDE 0.9 % IV BOLUS
1000.0000 mL | Freq: Once | INTRAVENOUS | Status: AC
  Administered 2021-01-05: 1000 mL via INTRAVENOUS

## 2021-01-05 MED ORDER — POTASSIUM CHLORIDE CRYS ER 20 MEQ PO TBCR
30.0000 meq | EXTENDED_RELEASE_TABLET | Freq: Once | ORAL | Status: AC
  Administered 2021-01-05: 30 meq via ORAL

## 2021-01-05 MED ORDER — METOCLOPRAMIDE HCL 10 MG PO TABS: 10.0000 mg | ORAL_TABLET | Freq: Four times a day (QID) | ORAL | 0 refills | Status: DC

## 2021-01-05 NOTE — Telephone Encounter (Signed)
Pt. Reports she started Jardiance last week and it has caused severe nausea and vomiting. States she cannot tolerate it. Reports blood sugars remain high.Please advise pt.   Answer Assessment - Initial Assessment Questions 1. NAME of MEDICATION: "What medicine are you calling about?"     Jardiance 2. QUESTION: "What is your question?" (e.g., double dose of medicine, side effect)     Side effect 3. PRESCRIBING HCP: "Who prescribed it?" Reason: if prescribed by specialist, call should be referred to that group.     [place] 4. SYMPTOMS: "Do you have any symptoms?"     Nausea, vomiting 5. SEVERITY: If symptoms are present, ask "Are they mild, moderate or severe?"     Severe 6. PREGNANCY:  "Is there any chance that you are pregnant?" "When was your last menstrual period?"     No  Protocols used: Medication Question Call-A-AH

## 2021-01-05 NOTE — Telephone Encounter (Signed)
Patient not able to tolerate Jardiance. Started taking last week and stopped 3 days ago due to N/V. Reports she had taken Ozempic for years it led to gastroparesis and this feels the same. Last fasting cbg around 7am this am was 315. Unable to tolerate eating, had one bottle of water today. Voiding more than normal, thirsty and feeling lightheaded at times. Does not take any oral hypoglycemics/insulin. Does not check for ketones.  Advised ED at this time for evaluation and treatment. Patient agrees. Reason for Disposition  [1] Caller has URGENT medicine question about med that PCP or specialist prescribed AND [2] triager unable to answer question  Answer Assessment - Initial Assessment Questions 1. NAME of MEDICATION: "What medicine are you calling about?"     jardiance 2. QUESTION: "What is your question?" (e.g., double dose of medicine, side effect)     Cannot tolerate jardiance 3. PRESCRIBING HCP: "Who prescribed it?" Reason: if prescribed by specialist, call should be referred to that group.     M.Edwards 4. SYMPTOMS: "Do you have any symptoms?"     Nausea and vomiting 5. SEVERITY: If symptoms are present, ask "Are they mild, moderate or severe?"     moderate 6. PREGNANCY:  "Is there any chance that you are pregnant?" "When was your last menstrual period?"     no  Protocols used: Medication Question Call-A-AH

## 2021-01-05 NOTE — ED Notes (Signed)
Pt via pov from home with hyperglycemia, was told by her pcp to come to ED. Pt has previously been on Jardiance to control her blood sugar and weight, but she went off it because she had gastroparesis. Pt reports that her fasting blood sugar was 314 this morning. Pt alert & oriented, nad noted.

## 2021-01-05 NOTE — ED Provider Notes (Signed)
[facility] EMERGENCY DEPT Provider Note   CSN: [id number] Arrival date & time: 01/05/21  1727     History Chief Complaint  Patient presents with   Hyperglycemia    Gwendolyn Carrillo is a 45 y.o. female.  HPI   Pt is a 45 y/o female with a h/o anxiety, DM, HTN, who presents to the ED today for eval of hyperglycemia, nausea and vomiting.  She states she was recently started on Jardiance last week. After being on it for several days she developed nausea and vomiting so she stopped taking it 4 days ago. Since then she had had elevated fasting blood sugars.   She denies any fevers, chest pain, shortness of breath, cough, urinary symptoms.  She called her PCP and was advised to come here for further evaluation.  Past Medical History:  Diagnosis Date   Anxiety    Diabetes mellitus without complication ([place])    Hypertension     Patient Active Problem List   Diagnosis Date Noted   GAD (generalized anxiety disorder) 08/21/2020   Gastroparesis 08/21/2020   Type II diabetes mellitus ([place]) 08/21/2020   Hypertension 08/21/2020   Vitamin D deficiency 08/21/2020   MDD (major depressive disorder), recurrent severe, without psychosis ([place])    S/P appendectomy 05/19/2014    Past Surgical History:  Procedure Laterality Date   APPENDECTOMY     CESAREAN SECTION     ENDOMETRIAL ABLATION       OB History     Gravida  4   Para  4   Term      Preterm      AB      Living         SAB      IAB      Ectopic      Multiple      Live Births              History reviewed. No pertinent family history.  Social History   Tobacco Use   Smoking status: Some Days    Types: Cigars   Smokeless tobacco: Never   Tobacco comments:    "I smoke when I'm drinking"  Vaping Use   Vaping Use: Never used  Substance Use Topics   Alcohol use: Yes    Comment: occ   Drug use: Yes    Types: Marijuana    Comment: "not very often"    Home Medications Prior to  Admission medications   Medication Sig Start Date End Date Taking? Authorizing Provider  busPIRone (BUSPAR) 10 MG tablet Take two tablets twice daily. 11/21/20  Yes Mozingo, Berdie Ogren, NP  Cholecalciferol (VITAMIN D3) 50 MCG (2000 UT) capsule Take 1 capsule (2,000 Units total) by mouth daily. 09/29/20  Yes Kerin Perna, NP  hydrochlorothiazide (HYDRODIURIL) 25 MG tablet Take 1 tablet (25 mg total) by mouth daily. 12/30/20  Yes Kerin Perna, NP  lisinopril (ZESTRIL) 20 MG tablet Take 1 tablet (20 mg total) by mouth daily. 12/30/20  Yes Kerin Perna, NP  metoCLOPramide (REGLAN) 10 MG tablet Take 1 tablet (10 mg total) by mouth every 6 (six) hours. 01/05/21  Yes Lacreshia Bondarenko S, PA-C  pantoprazole (PROTONIX) 40 MG tablet Take 1 tablet (40 mg total) by mouth daily. 12/30/20  Yes Kerin Perna, NP  atorvastatin (LIPITOR) 20 MG tablet Take 1 tablet (20 mg total) by mouth daily. 09/29/20   Kerin Perna, NP  blood glucose meter kit and supplies Dispense based  on patient and insurance preference. Use up to four times daily as directed. (FOR ICD-10 E10.9, E11.9). 12/30/20   Kerin Perna, NP  empagliflozin (JARDIANCE) 25 MG TABS tablet Take 1 tablet (25 mg total) by mouth daily before breakfast. 12/30/20   Kerin Perna, NP  hydrOXYzine (VISTARIL) 50 MG capsule Take 1 capsule (50 mg total) by mouth 3 (three) times daily as needed. 11/21/20   Mozingo, Berdie Ogren, NP  LINZESS 145 MCG CAPS capsule Take 145 mcg by mouth daily as needed (constipation). 08/06/20   [provider]    Allergies    Patient has no known allergies.  Review of Systems   Review of Systems  Constitutional:  Positive for fatigue. Negative for fever.  HENT:  Negative for ear pain and sore throat.   Eyes:  Negative for visual disturbance.  Respiratory:  Negative for cough and shortness of breath.   Cardiovascular:  Negative for chest pain.  Gastrointestinal:  Negative for abdominal  pain, constipation, diarrhea, nausea and vomiting.  Genitourinary:  Negative for dysuria and hematuria.  Musculoskeletal:  Negative for back pain.  Skin:  Negative for rash.  Neurological:  Negative for headaches.  All other systems reviewed and are negative.  Physical Exam Updated Vital Signs BP (!) 137/98   Pulse 83   Temp 98 F (36.7 C)   Resp 18   Ht _0  (1.626 m)   Wt 86.2 kg   SpO2 100%   BMI 32.61 kg/m   Physical Exam Vitals and nursing note reviewed.  Constitutional:      General: She is not in acute distress.    Appearance: She is well-developed.  HENT:     Head: Normocephalic and atraumatic.  Eyes:     Conjunctiva/sclera: Conjunctivae normal.  Cardiovascular:     Rate and Rhythm: Normal rate and regular rhythm.     Heart sounds: Normal heart sounds. No murmur heard. Pulmonary:     Effort: Pulmonary effort is normal. No respiratory distress.     Breath sounds: Normal breath sounds. No wheezing, rhonchi or rales.  Abdominal:     General: Bowel sounds are normal.     Palpations: Abdomen is soft.     Tenderness: There is no abdominal tenderness. There is no guarding or rebound.  Musculoskeletal:     Cervical back: Neck supple.  Skin:    General: Skin is warm and dry.  Neurological:     Mental Status: She is alert.    ED Results / Procedures / Treatments   Labs (all labs ordered are listed, but only abnormal results are displayed) Labs Reviewed  BASIC METABOLIC PANEL - Abnormal; Notable for the following components:      Result Value   Sodium 133 (*)    Potassium 3.4 (*)    Chloride 95 (*)    Glucose, Bld 237 (*)    All other components within normal limits  CBC - Abnormal; Notable for the following components:   WBC 12.2 (*)    MCHC 36.2 (*)    RDW 11.0 (*)    All other components within normal limits  URINALYSIS, ROUTINE W REFLEX MICROSCOPIC - Abnormal; Notable for the following components:   Specific Gravity, Urine 1.035 (*)    Glucose, UA  >1,000 (*)    Ketones, ur TRACE (*)    Protein, ur TRACE (*)    All other components within normal limits  HEPATIC FUNCTION PANEL - Abnormal; Notable for the following components:  Total Protein 8.2 (*)    All other components within normal limits  CBG MONITORING, ED - Abnormal; Notable for the following components:   Glucose-Capillary 257 (*)    All other components within normal limits  CBG MONITORING, ED - Abnormal; Notable for the following components:   Glucose-Capillary 196 (*)    All other components within normal limits  PREGNANCY, URINE  LIPASE, BLOOD  CBG MONITORING, ED    EKG None  Radiology No results found.  Procedures Procedures   Medications Ordered in ED Medications  metoCLOPramide (REGLAN) injection 5 mg (5 mg Intravenous Given 01/05/21 2054)  sodium chloride 0.9 % bolus 1,000 mL (1,000 mLs Intravenous New Bag/Given 01/05/21 2054)  potassium chloride SA (KLOR-CON) CR tablet 30 mEq (30 mEq Oral Given 01/05/21 2056)    ED Course  I have reviewed the triage vital signs and the nursing notes.  Pertinent labs & imaging results that were available during my care of the patient were reviewed by me and considered in my medical decision making (see chart for details).    MDM Rules/Calculators/A&P                          44 year old female presents to the emergency department today for evaluation of hyperglycemia.  She is recently started on Jardiance and developed nausea and vomiting so she stopped taking it.  She does report a history of gastroparesis.  Reviewed/interpreted labs CBC with mild leukocytosis  - hx same, has followed with heme about this, no infectious sxs BMP with mild hyponatremia, hypokalemia and hypochloremia.  Blood sugar is elevated to 237 but patient has normal bicarb and no elevated anion gap to suggest DKA. LFTs wnl Lipase neg UA neg Preg neg  Pt was given ivf and reglan. Her repeat CBG is improving and on reassessment she states she  feels completely improved and is ready to go home.  She is been tolerating p.o.  We will give her Reglan for home as I suspect her symptoms are likely related to gastroparesis.  I recommend that she call her PCP for further recommendations on changing up her medications for diabetes and she plans to do so.  Have advised that she return to the ED for any new or worsening symptoms in the meantime.  All questions answered.  Patient stable for discharge..    Final Clinical Impression(s) / ED Diagnoses Final diagnoses:  Hyperglycemia    Rx / DC Orders ED Discharge Orders          Ordered    metoCLOPramide (REGLAN) 10 MG tablet  Every 6 hours        01/05/21 2149             Bishop Dublin 01/05/21 2152    Truddie Hidden, MD 01/05/21 2316

## 2021-01-05 NOTE — Discharge Instructions (Addendum)
Take reglan as directed   Please follow up with your primary doctor within the next 5-7 days.  If you do not have a primary care provider, information for a healthcare clinic has been provided for you to make arrangements for follow up care. Please return to the ER sooner if you have any new or worsening symptoms, or if you have any of the following symptoms:  Abdominal pain that does not go away.  You have a fever.  You keep throwing up (vomiting).  The pain is felt only in portions of the abdomen. Pain in the right side could possibly be appendicitis. In an adult, pain in the left lower portion of the abdomen could be colitis or diverticulitis.  You pass bloody or black tarry stools.  There is bright red blood in the stool.  The constipation stays for more than 4 days.  There is belly (abdominal) or rectal pain.  You do not seem to be getting better.  You have any questions or concerns.

## 2021-01-05 NOTE — ED Triage Notes (Signed)
Pt was sent by her PCP to have her blood sugar check.

## 2021-01-05 NOTE — ED Notes (Signed)
Pt discharged home after verbalizing understanding of discharge instructions; nad noted. 

## 2021-01-05 NOTE — ED Notes (Signed)
Called lab to add on hepatic function and lipase 

## 2021-01-06 NOTE — Telephone Encounter (Signed)
Routed to PCP to advise.

## 2021-01-07 ENCOUNTER — Ambulatory Visit (INDEPENDENT_AMBULATORY_CARE_PROVIDER_SITE_OTHER): Payer: Self-pay | Admitting: *Deleted

## 2021-01-07 ENCOUNTER — Telehealth: Payer: Self-pay | Admitting: Pharmacist

## 2021-01-07 ENCOUNTER — Other Ambulatory Visit (INDEPENDENT_AMBULATORY_CARE_PROVIDER_SITE_OTHER): Payer: Self-pay | Admitting: Primary Care

## 2021-01-07 ENCOUNTER — Telehealth (INDEPENDENT_AMBULATORY_CARE_PROVIDER_SITE_OTHER): Payer: Self-pay | Admitting: Primary Care

## 2021-01-07 MED ORDER — DAPAGLIFLOZIN PROPANEDIOL 5 MG PO TABS: 5.0000 mg | ORAL_TABLET | Freq: Every day | ORAL | 1 refills | Status: DC

## 2021-01-07 NOTE — Telephone Encounter (Signed)
Sent to PCP ?

## 2021-01-07 NOTE — Telephone Encounter (Signed)
I have never met this patient. Per chart, pt has tried metformin, glipizide, Ozempic, and Jardiance. Jardiance caused NV. Ozempic retrial not an option d/t gastroparesis. I can't find a reason in her chart for metformin or glipizide d/c. We could try another SGLT-2i such as Wilder Glade to see if she has a similar reaction.

## 2021-01-07 NOTE — Telephone Encounter (Signed)
Patient calling to request an alternative medication for her diabetes. Gwendolyn Carrillo is not covered by her insurance company per pharmacy.  Pharmacy on file.  Reason for Disposition  [1] Caller has URGENT medicine question about med that PCP or specialist prescribed AND [2] triager unable to answer question  Answer Assessment - Initial Assessment Questions 1. NAME of MEDICATION: "What medicine are you calling about?"     Farxiga 2. QUESTION: "What is your question?" (e.g., double dose of medicine, side effect)     Insurance will not cover  3. PRESCRIBING HCP: "Who prescribed it?" Reason: if prescribed by specialist, call should be referred to that group.     pcp 4. SYMPTOMS: "Do you have any symptoms?"     She has had mild symptoms since Monday 5. SEVERITY: If symptoms are present, ask "Are they mild, moderate or severe?"   Mild 6. PREGNANCY:  "Is there any chance that you are pregnant?" "When was your last menstrual period?"     no  Protocols used: Medication Question Call-A-AH

## 2021-01-07 NOTE — Telephone Encounter (Signed)
Pt called requesting an alternative for farxiga, currently insurance does not cover this and she cannot afford market price.  [id number] [place], [place] - [phone] AT [facility] OF [facility] & [address]  [address] [place] [address]  Phone: [phone] Fax: [phone]

## 2021-01-07 NOTE — Telephone Encounter (Signed)
Duplicate. An encounter has already been sent to PCP.

## 2021-01-09 MED ORDER — FREESTYLE LIBRE 2 SENSOR MISC: 2 refills | Status: DC

## 2021-01-09 MED ORDER — LANTUS SOLOSTAR 100 UNIT/ML ~~LOC~~ SOPN: 15.0000 [IU] | PEN_INJECTOR | Freq: Every day | SUBCUTANEOUS | 1 refills | Status: DC

## 2021-01-09 MED ORDER — PEN NEEDLES 32G X 5 MM MISC: 1 refills | Status: DC

## 2021-01-09 MED ORDER — FREESTYLE LIBRE 2 READER DEVI: 2 refills | Status: DC

## 2021-01-09 NOTE — Telephone Encounter (Signed)
Call placed to patient. Due to gastroparesis, pt has nausea/vomiting with most oral medications. She is requesting to go back on insulin. Reports home CBGs that are persistently in the 300s. She is also symptomatic. I have sent a rx to begin Lantus 15 units daily. She verbalized understanding of the plan and will follow-up with me on 10/6 for dose titration.

## 2021-01-19 ENCOUNTER — Telehealth (INDEPENDENT_AMBULATORY_CARE_PROVIDER_SITE_OTHER): Payer: Self-pay

## 2021-01-19 NOTE — Telephone Encounter (Signed)
Copied from CRM [id number]. Topic: General - Inquiry >> Jan 19, 2021  9:45 AM Daphine Deutscher D wrote: Reason for CRM: Pt would like diflucan sent to the pharmacy for a yeast infection due to high glucose  [facility]  [address]  [phone]

## 2021-01-21 ENCOUNTER — Other Ambulatory Visit (INDEPENDENT_AMBULATORY_CARE_PROVIDER_SITE_OTHER): Payer: Self-pay | Admitting: Primary Care

## 2021-01-21 MED ORDER — FLUCONAZOLE 150 MG PO TABS: 150.0000 mg | ORAL_TABLET | Freq: Once | ORAL | 1 refills | Status: AC

## 2021-01-27 DIAGNOSIS — Z0289 Encounter for other administrative examinations: Secondary | ICD-10-CM

## 2021-01-29 ENCOUNTER — Encounter: Payer: Self-pay | Admitting: Pharmacist

## 2021-01-29 ENCOUNTER — Other Ambulatory Visit: Payer: Self-pay

## 2021-01-29 ENCOUNTER — Ambulatory Visit: Payer: 59 | Attending: Primary Care | Admitting: Pharmacist

## 2021-01-29 DIAGNOSIS — E119 Type 2 diabetes mellitus without complications: Secondary | ICD-10-CM | POA: Diagnosis not present

## 2021-01-29 DIAGNOSIS — Z794 Long term (current) use of insulin: Secondary | ICD-10-CM

## 2021-01-29 MED ORDER — LANTUS SOLOSTAR 100 UNIT/ML ~~LOC~~ SOPN: PEN_INJECTOR | SUBCUTANEOUS | 1 refills | Status: DC

## 2021-01-29 NOTE — Progress Notes (Signed)
    S:    PCP: Gwinda Passe  No chief complaint on file.  Patient arrives in good spirits.  Presents for diabetes evaluation, education, and management. Patient was referred and last seen by Primary Care Provider on 12/30/2020.   Patient reports Diabetes was diagnosed in 2016-17. She was initially managed with insulin. This was changed to glipizide after she go control of her DM. She could not tolerate metformin. She experienced good results with Ozempic but this was stopped d/t gastroparesis. Finally, she was changed to [place] and could not tolerate these. She was started on insulin ~3-4 weeks ago.   Family/Social History:  -Fhx: no pertinent positives listed  -Tobacco: some day smoker -Alcohol: endorses occasional use   Insurance coverage/medication affordability: [employer]vertising [employer]   Medication adherence reported.   Current diabetes medications include: Lantus 15 units daily  Current hypertension medications include: HCTZ 25 mg daily, lisinopril 20 mg daily  Current hyperlipidemia medications include: atorvastatin 20 mg daily   Patient denies hypoglycemic events.  Patient reported dietary habits:  -Trying to maintain healthy diet   Patient-reported exercise habits:  - None reported    Patient reports polyuria.  Patient reports neuropathy (nerve pain). Patient denies visual changes. Patient reports self foot exams.     O:  Physical Exam  ROS  Lab Results  Component Value Date   HGBA1C 8.6 (A) 12/30/2020   There were no vitals filed for this visit.  Lipid Panel     Component Value Date/Time   CHOL 211 (H) 08/22/2020 0656   TRIG 163 (H) 08/22/2020 0656   HDL 50 08/22/2020 0656   CHOLHDL 4.2 08/22/2020 0656   VLDL 33 08/22/2020 0656   LDLCALC 128 (H) 08/22/2020 0656    Home fasting blood sugars: still sees blood sugars most often in the high 200s. Gives range in the 290s this week. No meter with her.    Clinical Atherosclerotic Cardiovascular  Disease (ASCVD): No  The 10-year ASCVD risk score (Arnett DK, et al., 2019) is: 15.7%   Values used to calculate the score:     Age: 45 years     Sex: Female     Is Non-Hispanic African American: Yes     Diabetic: Yes     Tobacco smoker: Yes     Systolic Blood Pressure: 137 mmHg     Is BP treated: Yes     HDL Cholesterol: 50 mg/dL     Total Cholesterol: 211 mg/dL   A/P: Diabetes longstanding currently uncontrolled. Patient is able to verbalize appropriate hypoglycemia management plan. Medication adherence appears appropriate.  -Increased dose of Lantus to 25 units daily. Patient will continue to titrate 2 units every 3 days if fasting blood sugar > 130mg /dl until fasting blood sugars reach goal or next visit.  -Extensively discussed pathophysiology of diabetes, recommended lifestyle interventions, dietary effects on blood sugar control -Counseled on s/sx of and management of hypoglycemia -Next A1C anticipated 03/2021.   Written patient instructions provided.  Total time in face to face counseling 30 minutes.   Follow up Pharmacist Clinic Visit in 1 month.    04/2021, PharmD, Butch Penny, CPP Clinical Pharmacist [facility] [phone]

## 2021-02-04 ENCOUNTER — Ambulatory Visit (INDEPENDENT_AMBULATORY_CARE_PROVIDER_SITE_OTHER): Payer: 59 | Admitting: Adult Health

## 2021-02-04 ENCOUNTER — Encounter: Payer: Self-pay | Admitting: Adult Health

## 2021-02-04 ENCOUNTER — Other Ambulatory Visit: Payer: Self-pay

## 2021-02-04 ENCOUNTER — Telehealth: Payer: Self-pay | Admitting: Adult Health

## 2021-02-04 DIAGNOSIS — F422 Mixed obsessional thoughts and acts: Secondary | ICD-10-CM

## 2021-02-04 DIAGNOSIS — F331 Major depressive disorder, recurrent, moderate: Secondary | ICD-10-CM | POA: Diagnosis not present

## 2021-02-04 DIAGNOSIS — F411 Generalized anxiety disorder: Secondary | ICD-10-CM | POA: Diagnosis not present

## 2021-02-04 DIAGNOSIS — F410 Panic disorder [episodic paroxysmal anxiety] without agoraphobia: Secondary | ICD-10-CM

## 2021-02-04 MED ORDER — HYDROXYZINE PAMOATE 50 MG PO CAPS: 50.0000 mg | ORAL_CAPSULE | Freq: Three times a day (TID) | ORAL | 2 refills | Status: DC | PRN

## 2021-02-04 MED ORDER — BUSPIRONE HCL 30 MG PO TABS: ORAL_TABLET | ORAL | 2 refills | Status: DC

## 2021-02-04 NOTE — Telephone Encounter (Signed)
Received Mental Health Attending Physician's Statement Form. Placed in Traci's box 02/04/21

## 2021-02-04 NOTE — Progress Notes (Signed)
Gwendolyn Carrillo [id number] [birth year] 45 y.o.  Subjective:   Patient ID:  Gwendolyn Carrillo is a 45 y.o. (DOB 1975/09/23) female.  Chief Complaint: No chief complaint on file.   HPI HESPER VENTURELLA presents to the office today for follow-up of MDD, mixed obsessional thoughts, panic attacks, and GAD.   Describes mood today as "not so good". Decreased tearfulness. Pleasant. Mood symptoms - reports increased  anxiety and irritability. Agitated easily. Denies depression - feels dreadful and sad at times - more distant from family. Felt like the Buspar was helpful, but not working as well as it did. Denies panic attacks - it's been borderline". Doesn't feel like Buspar is as helpful as it was. Having to push herself to do anything. Working for [employer] - "I can't do it right now". Feels like she needs to take a few weeks off to "work on herself". Decreased interest and motivation. Taking medications as prescribed. Seeing therapist.  Energy levels decreased. Active, does not have a regular exercise routine.  Able to enjoy usual interests and activities. Single. Dating. Has 4 children 25, 19, 23, and 15. Family local. Spending time with family. Appetite improved. Weight gain - 185 pounds.  Sleeping difficulties - using Hydroxyzine again. Averages 6 to 8 hours. Focus and concentration difficulties. Completing tasks. Managing some aspects of household. Works full time for [employer]   Denies SI or HI.  Denies AH or VH.   [place] Office Visit from 12/30/2020 in [facility] Office Visit from 09/29/2020 in [facility]  Total GAD-7 Score 10 6      PHQ2-9    [place] Office Visit from 12/30/2020 in [facility] Office Visit from 09/29/2020 in [facility] from 08/20/2020 in [facility]  PHQ-2 Total Score '4 5 6  ' PHQ-9 Total Score  '11 12 17      ' Flowsheet Row ED from 01/05/2021 in [facility] ED from 12/24/2020 in [facility] ED from 08/20/2020 in [facility]  C-SSRS RISK CATEGORY No Risk No Risk High Risk        Review of Systems:  Review of Systems  Musculoskeletal:  Negative for gait problem.  Neurological:  Negative for tremors.  Psychiatric/Behavioral:         Please refer to HPI   Medications: I have reviewed the patient's current medications.  Current Outpatient Medications  Medication Sig Dispense Refill   atorvastatin (LIPITOR) 20 MG tablet Take 1 tablet (20 mg total) by mouth daily. 90 tablet 3   blood glucose meter kit and supplies Dispense based on patient and insurance preference. Use up to four times daily as directed. (FOR ICD-10 E10.9, E11.9). 1 each 0   busPIRone (BUSPAR) 10 MG tablet Take two tablets twice daily. 120 tablet 2   Cholecalciferol (VITAMIN D3) 50 MCG (2000 UT) capsule Take 1 capsule (2,000 Units total) by mouth daily. 90 capsule 1   Continuous Blood Gluc Receiver (FREESTYLE LIBRE 2 READER) DEVI Use to check blood sugar TID. 1 each 2   Continuous Blood Gluc Sensor (FREESTYLE LIBRE 2 SENSOR) MISC Use to check blood sugar TID. 1 each 2   hydrochlorothiazide (HYDRODIURIL) 25 MG tablet Take 1 tablet (25 mg total) by mouth daily. 90 tablet 1   hydrOXYzine (VISTARIL) 50 MG capsule Take 1 capsule (50 mg total) by  mouth 3 (three) times daily as needed. 30 capsule 2   insulin glargine (LANTUS SOLOSTAR) 100 UNIT/ML Solostar Pen Inject 25 units subq once daily. Increase by 2 units every 3 days if fasting glucose is >130. Max daily dose: 32 units. 15 mL 1   Insulin Pen Needle (PEN NEEDLES) 32G X 5 MM MISC Use to inject Lantus (insulin) once daily. 100 each 1   LINZESS 145 MCG CAPS capsule Take 145 mcg by mouth daily as needed (constipation).     lisinopril (ZESTRIL) 20 MG tablet Take 1 tablet (20 mg  total) by mouth daily. 90 tablet 1   metoCLOPramide (REGLAN) 10 MG tablet Take 1 tablet (10 mg total) by mouth every 6 (six) hours. 30 tablet 0   pantoprazole (PROTONIX) 40 MG tablet Take 1 tablet (40 mg total) by mouth daily. 90 tablet 0   No current facility-administered medications for this visit.    Medication Side Effects: None  Allergies: No Known Allergies  Past Medical History:  Diagnosis Date   Anxiety    Diabetes mellitus without complication ([place])    Hypertension     Past Medical History, Surgical history, Social history, and Family history were reviewed and updated as appropriate.   Please see review of systems for further details on the patient's review from today.   Objective:   Physical Exam:  There were no vitals taken for this visit.  Physical Exam Constitutional:      General: She is not in acute distress. Musculoskeletal:        General: No deformity.  Neurological:     Mental Status: She is alert and oriented to person, place, and time.     Coordination: Coordination normal.  Psychiatric:        Attention and Perception: Attention and perception normal. She does not perceive auditory or visual hallucinations.        Mood and Affect: Mood normal. Mood is not anxious or depressed. Affect is not labile, blunt, angry or inappropriate.        Speech: Speech normal.        Behavior: Behavior normal.        Thought Content: Thought content normal. Thought content is not paranoid or delusional. Thought content does not include homicidal or suicidal ideation. Thought content does not include homicidal or suicidal plan.        Cognition and Memory: Cognition and memory normal.        Judgment: Judgment normal.     Comments: Insight intact    Lab Review:     Component Value Date/Time   NA 133 (L) 01/05/2021 1805   K 3.4 (L) 01/05/2021 1805   CL 95 (L) 01/05/2021 1805   CO2 27 01/05/2021 1805   GLUCOSE 237 (H) 01/05/2021 1805   BUN 16 01/05/2021 1805    CREATININE 0.85 01/05/2021 1805   CALCIUM 9.7 01/05/2021 1805   PROT 8.2 (H) 01/05/2021 1813   ALBUMIN 4.6 01/05/2021 1813   AST 18 01/05/2021 1813   ALT 40 01/05/2021 1813   ALKPHOS 61 01/05/2021 1813   BILITOT 0.5 01/05/2021 1813   GFRNONAA >60 01/05/2021 1805   GFRAA >60 10/10/2019 1441       Component Value Date/Time   WBC 12.2 (H) 01/05/2021 1805   RBC 4.17 01/05/2021 1805   HGB 13.4 01/05/2021 1805   HCT 37.0 01/05/2021 1805   PLT 225 01/05/2021 1805   MCV 88.7 01/05/2021 1805   MCH 32.1 01/05/2021 1805  MCHC 36.2 (H) 01/05/2021 1805   RDW 11.0 (L) 01/05/2021 1805   LYMPHSABS 1.4 08/20/2020 1037   MONOABS 0.6 08/20/2020 1037   EOSABS 0.1 08/20/2020 1037   BASOSABS 0.0 08/20/2020 1037    No results found for: POCLITH, LITHIUM   No results found for: PHENYTOIN, PHENOBARB, VALPROATE, CBMZ   .res Assessment: Plan:    Plan:  PDMP reviewed  Increase Buspar 37m BID to 30 BID Hydroxyzine 582mat hs  Leave of absence from the work setting starting 02/02/2021 to through 02/17/2021 - letter given.  Continue therapy - [facility] WiMaricela Bo therapist.  RTC 4 weeks  Patien weeks advised to contact office with any questions, adverse effects, or acute worsening in signs and symptoms.  Discussed potential benefits, risk, and side effects of benzodiazepines to include potential risk of tolerance and dependence, as well as possible drowsiness.  Advised patient not to drive if experiencing drowsiness and to take lowest possible effective dose to minimize risk of dependence and tolerance.  There are no diagnoses linked to this encounter.   Please see After Visit Summary for patient specific instructions.  Future Appointments  Date Time Provider [place]10/03/2021 12:00 PM Aseel Truxillo, ReBerdie OgrenNP CP-CP None  03/02/2021  3:30 PM VaTresa EndoRPH-CPP [facility] None  03/18/2021 10:00 AM Myda Detwiler, ReBerdie OgrenNP CP-CP None  03/31/2021  3:10 PM  [facility]in [facility]    No orders of the defined types were placed in this encounter.   -------------------------------

## 2021-02-18 ENCOUNTER — Other Ambulatory Visit: Payer: Self-pay

## 2021-02-18 ENCOUNTER — Encounter (HOSPITAL_BASED_OUTPATIENT_CLINIC_OR_DEPARTMENT_OTHER): Payer: Self-pay

## 2021-02-18 ENCOUNTER — Emergency Department (HOSPITAL_BASED_OUTPATIENT_CLINIC_OR_DEPARTMENT_OTHER)
Admission: EM | Admit: 2021-02-18 | Discharge: 2021-02-18 | Disposition: A | Discharge status: Home / Self Care | Payer: 59 | Attending: Emergency Medicine | Admitting: Emergency Medicine

## 2021-02-18 DIAGNOSIS — K529 Noninfective gastroenteritis and colitis, unspecified: Secondary | ICD-10-CM | POA: Insufficient documentation

## 2021-02-18 DIAGNOSIS — Z794 Long term (current) use of insulin: Secondary | ICD-10-CM | POA: Diagnosis not present

## 2021-02-18 DIAGNOSIS — E119 Type 2 diabetes mellitus without complications: Secondary | ICD-10-CM | POA: Insufficient documentation

## 2021-02-18 DIAGNOSIS — F17210 Nicotine dependence, cigarettes, uncomplicated: Secondary | ICD-10-CM | POA: Diagnosis not present

## 2021-02-18 DIAGNOSIS — I10 Essential (primary) hypertension: Secondary | ICD-10-CM | POA: Insufficient documentation

## 2021-02-18 DIAGNOSIS — R739 Hyperglycemia, unspecified: Secondary | ICD-10-CM

## 2021-02-18 DIAGNOSIS — R112 Nausea with vomiting, unspecified: Secondary | ICD-10-CM | POA: Diagnosis present

## 2021-02-18 LAB — CBC
HCT: 43.7 % (ref 36.0–46.0)
Hemoglobin: 15.1 g/dL — ABNORMAL HIGH (ref 12.0–15.0)
MCH: 30.6 pg (ref 26.0–34.0)
MCHC: 34.6 g/dL (ref 30.0–36.0)
MCV: 88.5 fL (ref 80.0–100.0)
Platelets: 276 10*3/uL (ref 150–400)
RBC: 4.94 MIL/uL (ref 3.87–5.11)
RDW: 11.6 % (ref 11.5–15.5)
WBC: 13 10*3/uL — ABNORMAL HIGH (ref 4.0–10.5)
nRBC: 0 % (ref 0.0–0.2)

## 2021-02-18 LAB — COMPREHENSIVE METABOLIC PANEL
ALT: 42 U/L (ref 0–44)
AST: 28 U/L (ref 15–41)
Albumin: 4.5 g/dL (ref 3.5–5.0)
Alkaline Phosphatase: 78 U/L (ref 38–126)
Anion gap: 15 (ref 5–15)
BUN: 17 mg/dL (ref 6–20)
CO2: 24 mmol/L (ref 22–32)
Calcium: 9.7 mg/dL (ref 8.9–10.3)
Chloride: 94 mmol/L — ABNORMAL LOW (ref 98–111)
Creatinine, Ser: 1 mg/dL (ref 0.44–1.00)
GFR, Estimated: >60 mL/min (ref >60)
Glucose, Bld: 400 mg/dL — ABNORMAL HIGH (ref 70–99)
Potassium: 3.8 mmol/L (ref 3.5–5.1)
Sodium: 133 mmol/L — ABNORMAL LOW (ref 135–145)
Total Bilirubin: 0.8 mg/dL (ref 0.3–1.2)
Total Protein: 8.5 g/dL — ABNORMAL HIGH (ref 6.5–8.1)

## 2021-02-18 LAB — PREGNANCY, URINE: Preg Test, Ur: NEGATIVE

## 2021-02-18 LAB — URINALYSIS, ROUTINE W REFLEX MICROSCOPIC
Bilirubin Urine: NEGATIVE
Glucose, UA: >1000 mg/dL — AB
Ketones, ur: 15 mg/dL — AB
Leukocytes,Ua: NEGATIVE
Nitrite: NEGATIVE
Specific Gravity, Urine: 1.04 — ABNORMAL HIGH (ref 1.005–1.030)
pH: 5.5 (ref 5.0–8.0)

## 2021-02-18 LAB — LIPASE, BLOOD: Lipase: 69 U/L — ABNORMAL HIGH (ref 11–51)

## 2021-02-18 MED ORDER — ONDANSETRON 4 MG PO TBDP: 4.0000 mg | ORAL_TABLET | Freq: Three times a day (TID) | ORAL | 0 refills | Status: DC | PRN

## 2021-02-18 MED ORDER — ACETAMINOPHEN 325 MG PO TABS
650.0000 mg | ORAL_TABLET | Freq: Once | ORAL | Status: AC
  Administered 2021-02-18: 650 mg via ORAL
  Filled 2021-02-18: qty 2

## 2021-02-18 MED ORDER — SODIUM CHLORIDE 0.9 % IV BOLUS
1000.0000 mL | Freq: Once | INTRAVENOUS | Status: AC
  Administered 2021-02-18: 1000 mL via INTRAVENOUS

## 2021-02-18 MED ORDER — ONDANSETRON HCL 4 MG/2ML IJ SOLN
4.0000 mg | Freq: Once | INTRAMUSCULAR | Status: AC
  Administered 2021-02-18: 4 mg via INTRAVENOUS
  Filled 2021-02-18: qty 2

## 2021-02-18 MED ORDER — LACTATED RINGERS IV BOLUS
1000.0000 mL | Freq: Once | INTRAVENOUS | Status: AC
  Administered 2021-02-18: 1000 mL via INTRAVENOUS

## 2021-02-18 NOTE — Discharge Instructions (Signed)
If you develop worsening, continued, or recurrent abdominal pain, uncontrolled vomiting, fever, chest or back pain, blood in your stools, or any other new/concerning symptoms then return to the ER for evaluation.

## 2021-02-18 NOTE — ED Provider Notes (Signed)
[facility] DE[facility] Provider Note   CSN: [id number] Arrival date & time: 02/18/21  0805     History Chief Complaint  Patient presents with  . Abdominal Pain  . Emesis  . Diarrhea    Gwendolyn Carrillo is a 45 y.o. female.  HPI 45 year old female presents with vomiting, diarrhea and abdominal pain.  Started around 2 AM.  At first she had some bloating and abdominal discomfort and then she started having the vomiting.  She has vomited numerous times and had multiple episodes of diarrhea.  No blood in either.  No fevers.  The abdominal pain is improved since then and now just feels like an uncomfortable knot in her abdomen.  She rates this as a 3 out of 10.  She has not been able to keep down fluids.  Her mouth feels very dry.  No recent antibiotic use but she just got back 6 days ago from [place] where she was there for a week.  Past Medical History:  Diagnosis Date  . Anxiety   . Diabetes mellitus without complication ([place])   . Hypertension     Patient Active Problem List   Diagnosis Date Noted  . GAD (generalized anxiety disorder) 08/21/2020  . Gastroparesis 08/21/2020  . Type II diabetes mellitus ([place]) 08/21/2020  . Hypertension 08/21/2020  . Vitamin D deficiency 08/21/2020  . MDD (major depressive disorder), recurrent severe, without psychosis ([place])   . S/P appendectomy 05/19/2014    Past Surgical History:  Procedure Laterality Date  . APPENDECTOMY    . CESAREAN SECTION    . ENDOMETRIAL ABLATION       OB History     Gravida  4   Para  4   Term      Preterm      AB      Living         SAB      IAB      Ectopic      Multiple      Live Births              No family history on file.  Social History   Tobacco Use  . Smoking status: Some Days    Types: Cigarettes  . Smokeless tobacco: Never  . Tobacco comments:    "I smoke when I'm drinking"  Vaping Use  . Vaping Use: Never used  Substance Use Topics  . Alcohol  use: Yes    Comment: occ  . Drug use: Yes    Types: Marijuana    Comment: "not very often"    Home Medications Prior to Admission medications   Medication Sig Start Date End Date Taking? Authorizing Provider  ondansetron (ZOFRAN ODT) 4 MG disintegrating tablet Take 1 tablet (4 mg total) by mouth every 8 (eight) hours as needed for nausea or vomiting. 02/18/21  Yes Sherwood Gambler, MD  atorvastatin (LIPITOR) 20 MG tablet Take 1 tablet (20 mg total) by mouth daily. 09/29/20   Kerin Perna, NP  blood glucose meter kit and supplies Dispense based on patient and insurance preference. Use up to four times daily as directed. (FOR ICD-10 E10.9, E11.9). 12/30/20   Kerin Perna, NP  busPIRone (BUSPAR) 30 MG tablet Take two tablets twice daily. 02/04/21   Mozingo, Berdie Ogren, NP  Cholecalciferol (VITAMIN D3) 50 MCG (2000 UT) capsule Take 1 capsule (2,000 Units total) by mouth daily. 09/29/20   Kerin Perna, NP  Continuous Blood Gluc Receiver (FREESTYLE  LIBRE 2 READER) DEVI Use to check blood sugar TID. 01/09/21   Charlott Rakes, MD  Continuous Blood Gluc Sensor (FREESTYLE LIBRE 2 SENSOR) MISC Use to check blood sugar TID. 01/09/21   Charlott Rakes, MD  hydrochlorothiazide (HYDRODIURIL) 25 MG tablet Take 1 tablet (25 mg total) by mouth daily. 12/30/20   Kerin Perna, NP  hydrOXYzine (VISTARIL) 50 MG capsule Take 1 capsule (50 mg total) by mouth 3 (three) times daily as needed. 02/04/21   Mozingo, Berdie Ogren, NP  insulin glargine (LANTUS SOLOSTAR) 100 UNIT/ML Solostar Pen Inject 25 units subq once daily. Increase by 2 units every 3 days if fasting glucose is >130. Max daily dose: 32 units. 01/29/21   Charlott Rakes, MD  Insulin Pen Needle (PEN NEEDLES) 32G X 5 MM MISC Use to inject Lantus (insulin) once daily. 01/09/21   Charlott Rakes, MD  LINZESS 145 MCG CAPS capsule Take 145 mcg by mouth daily as needed (constipation). 08/06/20   [provider]  lisinopril (ZESTRIL)  20 MG tablet Take 1 tablet (20 mg total) by mouth daily. 12/30/20   Kerin Perna, NP  metoCLOPramide (REGLAN) 10 MG tablet Take 1 tablet (10 mg total) by mouth every 6 (six) hours. 01/05/21   Couture, Cortni S, PA-C  pantoprazole (PROTONIX) 40 MG tablet Take 1 tablet (40 mg total) by mouth daily. 12/30/20   Kerin Perna, NP    Allergies    Patient has no known allergies.  Review of Systems   Review of Systems  Constitutional:  Negative for fever.  Gastrointestinal:  Positive for abdominal pain, diarrhea, nausea and vomiting. Negative for blood in stool.  All other systems reviewed and are negative.  Physical Exam Updated Vital Signs BP (!) 151/105   Pulse (!) 107   Temp 98.3 F (36.8 C) (Oral)   Resp 16   Ht _0  (1.626 m)   Wt 86.2 kg   SpO2 100%   BMI 32.61 kg/m   Physical Exam Vitals and nursing note reviewed.  Constitutional:      General: She is not in acute distress.    Appearance: She is well-developed. She is not ill-appearing or diaphoretic.  HENT:     Head: Normocephalic and atraumatic.     Right Ear: External ear normal.     Left Ear: External ear normal.     Nose: Nose normal.  Eyes:     General:        Right eye: No discharge.        Left eye: No discharge.  Cardiovascular:     Rate and Rhythm: Regular rhythm. Tachycardia present.     Heart sounds: Normal heart sounds.  Pulmonary:     Effort: Pulmonary effort is normal.     Breath sounds: Normal breath sounds.  Abdominal:     Palpations: Abdomen is soft.     Tenderness: There is abdominal tenderness in the right upper quadrant and left upper quadrant.  Skin:    General: Skin is warm and dry.  Neurological:     Mental Status: She is alert.  Psychiatric:        Mood and Affect: Mood is not anxious.    ED Results / Procedures / Treatments   Labs (all labs ordered are listed, but only abnormal results are displayed) Labs Reviewed  LIPASE, BLOOD - Abnormal; Notable for the following  components:      Result Value   Lipase 69 (*)    All other components  within normal limits  COMPREHENSIVE METABOLIC PANEL - Abnormal; Notable for the following components:   Sodium 133 (*)    Chloride 94 (*)    Glucose, Bld 400 (*)    Total Protein 8.5 (*)    All other components within normal limits  CBC - Abnormal; Notable for the following components:   WBC 13.0 (*)    Hemoglobin 15.1 (*)    All other components within normal limits  URINALYSIS, ROUTINE W REFLEX MICROSCOPIC - Abnormal; Notable for the following components:   Specific Gravity, Urine 1.040 (*)    Glucose, UA >1,000 (*)    Hgb urine dipstick TRACE (*)    Ketones, ur 15 (*)    Protein, ur TRACE (*)    Bacteria, UA RARE (*)    All other components within normal limits  PREGNANCY, URINE    EKG None  Radiology No results found.  Procedures Procedures   Medications Ordered in ED Medications  sodium chloride 0.9 % bolus 1,000 mL (0 mLs Intravenous Stopped 02/18/21 0935)  ondansetron (ZOFRAN) injection 4 mg (4 mg Intravenous Given 02/18/21 0842)  acetaminophen (TYLENOL) tablet 650 mg (650 mg Oral Given 02/18/21 0940)  lactated ringers bolus 1,000 mL (1,000 mLs Intravenous New Bag/Given 02/18/21 7218)    ED Course  I have reviewed the triage vital signs and the nursing notes.  Pertinent labs & imaging results that were available during my care of the patient were reviewed by me and considered in my medical decision making (see chart for details).    MDM Rules/Calculators/A&P                           Patient appears to have gastroenteritis.  Could be traveler's diarrhea given the recent trip to [place] though 6 days later would be a little atypical.  Otherwise, she is feeling better after fluids and Zofran.  She is able to tolerate p.o.  Heart rate has come down into the 90s on my most recent exam.  She does have hyperglycemia but her anion gap is only 15 and there is only minimal ketones in her urine so I  think DKA is not likely.  At this point, advised supportive care, glucose management at home, and return precautions were discussed.  I doubt an acute abdominal emergency and do not think antibiotics or CT is warranted. Final Clinical Impression(s) / ED Diagnoses Final diagnoses:  Acute gastroenteritis  Hyperglycemia    Rx / DC Orders ED Discharge Orders          Ordered    ondansetron (ZOFRAN ODT) 4 MG disintegrating tablet  Every 8 hours PRN        02/18/21 1033             Sherwood Gambler, MD 02/18/21 1053

## 2021-02-18 NOTE — ED Notes (Signed)
Pt tolerating diet Ginger Ale.

## 2021-02-18 NOTE — ED Triage Notes (Signed)
Pt c/o mid abdominal pain, n/v/d, and chills starting this morning.  Pain score 3/10.  Pt has not taken anything for symptoms.  Hx of DM.  Denies sick contacts.

## 2021-03-02 ENCOUNTER — Ambulatory Visit: Payer: 59 | Admitting: Pharmacist

## 2021-03-02 NOTE — Progress Notes (Unsigned)
S:     No chief complaint on file.   Patient arrives ***.  Presents for diabetes evaluation, education, and management Patient was referred and last seen by Primary Care Provider on 12/30/2020. Patient was last seen by pharmacy on 01/29/2021. Patient visited the ED on 10/26 for acute gastroenteritis.   Patient reports Diabetes was diagnosed in 2016 or 2017.   Family/Social History: ***  Insurance coverage/medication affordability: [employer]vertising [employer]  Medication adherence reported *** .   Current diabetes medications include:  -Lantus 25 units daily with plans to titrate 2 units every 3 days if fasting blood sugar > 130mg /dl until fasting blood sugars reach goal or next visit.  Current hypertension medications include:  -HCTZ 25 mg daily -Lisinopril 20 mg daily Current hyperlipidemia medications include:  -Atorvastatin 20 gm daily  Patient {Actions; denies-reports:120008} hypoglycemic events.  Patient reported dietary habits: Eats *** meals/day Breakfast:*** Lunch:*** Dinner:*** Snacks:*** Drinks:***  Patient-reported exercise habits: ***   Patient {Actions; denies-reports:120008} nocturia (nighttime urination).  Patient {Actions; denies-reports:120008} neuropathy (nerve pain). Patient {Actions; denies-reports:120008} visual changes. Patient {Actions; denies-reports:120008} self foot exams.     O:  Physical Exam  ROS  Lab Results  Component Value Date   HGBA1C 8.6 (A) 12/30/2020   There were no vitals filed for this visit.  Lipid Panel     Component Value Date/Time   CHOL 211 (H) 08/22/2020 0656   TRIG 163 (H) 08/22/2020 0656   HDL 50 08/22/2020 0656   CHOLHDL 4.2 08/22/2020 0656   VLDL 33 08/22/2020 0656   LDLCALC 128 (H) 08/22/2020 0656    Home fasting blood sugars: ***  2 hour post-meal/random blood sugars: ***.  Clinical Atherosclerotic Cardiovascular Disease (ASCVD): {YES/NO:21197} The 10-year ASCVD risk score (Arnett DK, et al., 2019) is:  10.1%   Values used to calculate the score:     Age: 89 years     Sex: Female     Is Non-Hispanic African American: Yes     Diabetic: Yes     Tobacco smoker: Yes     Systolic Blood Pressure: 125 mmHg     Is BP treated: Yes     HDL Cholesterol: 50 mg/dL     Total Cholesterol: 211 mg/dL   A/P: Diabetes longstanding*** currently ***. Patient is *** able to verbalize appropriate hypoglycemia management plan. Medication adherence appears ***. Control is suboptimal due to ***. -{Meds adjust:18428} basal insulin *** (insulin ***). Patient will continue to titrate 1 unit every *** days if fasting blood sugar > 100mg /dl until fasting blood sugars reach goal or next visit.  -{Meds adjust:18428}  rapid insulin *** (insulin ***) to ***.  -{Meds adjust:18428} GLP-1 *** (generic name***) to ***.  -{Meds adjust:18428} SGLT2-I *** (generic name***) to ***. Counseled on sick day rules for ***. -Extensively discussed pathophysiology of diabetes, recommended lifestyle interventions, dietary effects on blood sugar control -Counseled on s/sx of and management of hypoglycemia -Next A1C anticipated 03/31/2021.   ASCVD risk - primary prevention in patient with diabetes. Last LDL is not controlled. ASCVD risk score is not >20%  - {Desc; low/moderate/high:110033} intensity statin indicated. Aspirin {Is/is not:9024} indicated.  -{Meds adjust:18428} aspirin *** mg  -{Meds adjust:18428} ***statin *** mg.   Hypertension longstanding*** currently ***.  Blood pressure goal = *** mmHg. Medication adherence ***.  Blood pressure control is suboptimal due to ***. -***  Written patient instructions provided.  Total time in face to face counseling *** minutes.   Follow up Pharmacist/PCP*** Clinic Visit in ***.  Patient seen with ***

## 2021-03-16 ENCOUNTER — Telehealth: Payer: Self-pay | Admitting: Adult Health

## 2021-03-16 NOTE — Telephone Encounter (Signed)
Pt has refills on file with pharmacy

## 2021-03-16 NOTE — Telephone Encounter (Signed)
Pt would like refill of Buspar sent to [facility] and [address] Location.  Appt for 11/23 was cancelled by provider, she will call back in Dec to reschedule.

## 2021-03-18 ENCOUNTER — Ambulatory Visit: Payer: 59 | Admitting: Adult Health

## 2021-03-24 ENCOUNTER — Other Ambulatory Visit: Payer: Self-pay

## 2021-03-24 ENCOUNTER — Emergency Department (HOSPITAL_BASED_OUTPATIENT_CLINIC_OR_DEPARTMENT_OTHER)
Admission: EM | Admit: 2021-03-24 | Discharge: 2021-03-24 | Disposition: A | Discharge status: Home / Self Care | Payer: Self-pay | Attending: Emergency Medicine | Admitting: Emergency Medicine

## 2021-03-24 ENCOUNTER — Encounter (HOSPITAL_BASED_OUTPATIENT_CLINIC_OR_DEPARTMENT_OTHER): Payer: Self-pay | Admitting: Emergency Medicine

## 2021-03-24 DIAGNOSIS — F17210 Nicotine dependence, cigarettes, uncomplicated: Secondary | ICD-10-CM | POA: Insufficient documentation

## 2021-03-24 DIAGNOSIS — E119 Type 2 diabetes mellitus without complications: Secondary | ICD-10-CM | POA: Insufficient documentation

## 2021-03-24 DIAGNOSIS — B9689 Other specified bacterial agents as the cause of diseases classified elsewhere: Secondary | ICD-10-CM

## 2021-03-24 DIAGNOSIS — I10 Essential (primary) hypertension: Secondary | ICD-10-CM | POA: Insufficient documentation

## 2021-03-24 DIAGNOSIS — N760 Acute vaginitis: Secondary | ICD-10-CM | POA: Insufficient documentation

## 2021-03-24 DIAGNOSIS — Z794 Long term (current) use of insulin: Secondary | ICD-10-CM | POA: Insufficient documentation

## 2021-03-24 DIAGNOSIS — N3000 Acute cystitis without hematuria: Secondary | ICD-10-CM | POA: Insufficient documentation

## 2021-03-24 LAB — URINALYSIS, ROUTINE W REFLEX MICROSCOPIC
Bilirubin Urine: NEGATIVE
Glucose, UA: >1000 mg/dL — AB
Hgb urine dipstick: NEGATIVE
Ketones, ur: NEGATIVE mg/dL
Leukocytes,Ua: NEGATIVE
Nitrite: POSITIVE — AB
Protein, ur: NEGATIVE mg/dL
Specific Gravity, Urine: 1.036 — ABNORMAL HIGH (ref 1.005–1.030)
pH: 5.5 (ref 5.0–8.0)

## 2021-03-24 LAB — WET PREP, GENITAL
Sperm: NONE SEEN
Trich, Wet Prep: NONE SEEN
WBC, Wet Prep HPF POC: <10 (ref <10)
Yeast Wet Prep HPF POC: NONE SEEN

## 2021-03-24 LAB — CBG MONITORING, ED: Glucose-Capillary: 332 mg/dL — ABNORMAL HIGH (ref 70–99)

## 2021-03-24 MED ORDER — METRONIDAZOLE 500 MG PO TABS: 500.0000 mg | ORAL_TABLET | Freq: Two times a day (BID) | ORAL | 0 refills | Status: DC

## 2021-03-24 MED ORDER — CEPHALEXIN 250 MG PO CAPS: 250.0000 mg | ORAL_CAPSULE | Freq: Four times a day (QID) | ORAL | 0 refills | Status: DC

## 2021-03-24 NOTE — ED Notes (Signed)
This nurse and C. Meredeth Ide, PA at bedside for pelvic exam.

## 2021-03-24 NOTE — ED Provider Notes (Signed)
[facility] EMERGENCY DEPT Provider Note   CSN: [id number] Arrival date & time: 03/24/21  1507     History Chief Complaint  Patient presents with   Urinary Urgency    Gwendolyn Carrillo is a 45 y.o. female who presents the emergency department today with vaginal odor and dysuria over the last 3 days.  Patient is sexually active with 1 female partner and has been for the last 4 years.  She has a low suspicion for STD at this time.  Does report associated urinary urgency and frequency.  Has been taking Azo with little improvement.  She denies any abdominal pain, nausea, vomiting, diarrhea, fever, chills.  Dysuria is constant with every urinary stream.  No vaginal bleeding. Rates her dysuria moderate in severity.  HPI     Past Medical History:  Diagnosis Date   Anxiety    Diabetes mellitus without complication ([place])    Hypertension     Patient Active Problem List   Diagnosis Date Noted   GAD (generalized anxiety disorder) 08/21/2020   Gastroparesis 08/21/2020   Type II diabetes mellitus ([place]) 08/21/2020   Hypertension 08/21/2020   Vitamin D deficiency 08/21/2020   MDD (major depressive disorder), recurrent severe, without psychosis ([place])    S/P appendectomy 05/19/2014    Past Surgical History:  Procedure Laterality Date   APPENDECTOMY     CESAREAN SECTION     ENDOMETRIAL ABLATION       OB History     Gravida  4   Para  4   Term      Preterm      AB      Living         SAB      IAB      Ectopic      Multiple      Live Births              History reviewed. No pertinent family history.  Social History   Tobacco Use   Smoking status: Some Days    Types: Cigarettes   Smokeless tobacco: Never   Tobacco comments:    "I smoke when I'm drinking"  Vaping Use   Vaping Use: Never used  Substance Use Topics   Alcohol use: Yes    Comment: occ   Drug use: Yes    Types: Marijuana    Comment: "not very often"    Home  Medications Prior to Admission medications   Medication Sig Start Date End Date Taking? Authorizing Provider  cephALEXin (KEFLEX) 250 MG capsule Take 1 capsule (250 mg total) by mouth 4 (four) times daily. 03/24/21  Yes Raul Del, Stedman [place] M, PA-C  metroNIDAZOLE (FLAGYL) 500 MG tablet Take 1 tablet (500 mg total) by mouth 2 (two) times daily. 03/24/21  Yes Raul Del, Arianna Delsanto M, PA-C  atorvastatin (LIPITOR) 20 MG tablet Take 1 tablet (20 mg total) by mouth daily. 09/29/20   Kerin Perna, NP  blood glucose meter kit and supplies Dispense based on patient and insurance preference. Use up to four times daily as directed. (FOR ICD-10 E10.9, E11.9). 12/30/20   Kerin Perna, NP  busPIRone (BUSPAR) 30 MG tablet Take two tablets twice daily. 02/04/21   Mozingo, Berdie Ogren, NP  Cholecalciferol (VITAMIN D3) 50 MCG (2000 UT) capsule Take 1 capsule (2,000 Units total) by mouth daily. 09/29/20   Kerin Perna, NP  Continuous Blood Gluc Receiver (FREESTYLE LIBRE 2 READER) DEVI Use to check blood sugar TID. 01/09/21  Charlott Rakes, MD  Continuous Blood Gluc Sensor (FREESTYLE LIBRE 2 SENSOR) MISC Use to check blood sugar TID. 01/09/21   Charlott Rakes, MD  hydrochlorothiazide (HYDRODIURIL) 25 MG tablet Take 1 tablet (25 mg total) by mouth daily. 12/30/20   Kerin Perna, NP  hydrOXYzine (VISTARIL) 50 MG capsule Take 1 capsule (50 mg total) by mouth 3 (three) times daily as needed. 02/04/21   Mozingo, Berdie Ogren, NP  insulin glargine (LANTUS SOLOSTAR) 100 UNIT/ML Solostar Pen Inject 25 units subq once daily. Increase by 2 units every 3 days if fasting glucose is >130. Max daily dose: 32 units. 01/29/21   Charlott Rakes, MD  Insulin Pen Needle (PEN NEEDLES) 32G X 5 MM MISC Use to inject Lantus (insulin) once daily. 01/09/21   Charlott Rakes, MD  LINZESS 145 MCG CAPS capsule Take 145 mcg by mouth daily as needed (constipation). 08/06/20   [provider]  lisinopril (ZESTRIL) 20 MG tablet  Take 1 tablet (20 mg total) by mouth daily. 12/30/20   Kerin Perna, NP  metoCLOPramide (REGLAN) 10 MG tablet Take 1 tablet (10 mg total) by mouth every 6 (six) hours. 01/05/21   Couture, Cortni S, PA-C  ondansetron (ZOFRAN ODT) 4 MG disintegrating tablet Take 1 tablet (4 mg total) by mouth every 8 (eight) hours as needed for nausea or vomiting. 02/18/21   Sherwood Gambler, MD  pantoprazole (PROTONIX) 40 MG tablet Take 1 tablet (40 mg total) by mouth daily. 12/30/20   Kerin Perna, NP    Allergies    Patient has no known allergies.  Review of Systems   Review of Systems  All other systems reviewed and are negative.  Physical Exam Updated Vital Signs BP (!) 166/98   Pulse 83   Temp 98.8 F (37.1 C) (Oral)   Resp 16   Ht _0  (1.626 m)   Wt 86.2 kg   SpO2 100%   BMI 32.61 kg/m   Physical Exam Vitals and nursing note reviewed. Exam conducted with a chaperone present.  Constitutional:      General: She is not in acute distress.    Appearance: Normal appearance.  HENT:     Head: Normocephalic and atraumatic.  Eyes:     General:        Right eye: No discharge.        Left eye: No discharge.  Cardiovascular:     Comments: Regular rate and rhythm.  S1/S2 are distinct without any evidence of murmur, rubs, or gallops.  Radial pulses are 2+ bilaterally.  Dorsalis pedis pulses are 2+ bilaterally.  No evidence of pedal edema. Pulmonary:     Comments: Clear to auscultation bilaterally.  Normal effort.  No respiratory distress.  No evidence of wheezes, rales, or rhonchi heard throughout. Abdominal:     General: Abdomen is flat. Bowel sounds are normal. There is no distension.     Tenderness: There is no abdominal tenderness. There is no guarding or rebound.  Genitourinary:    Comments: External anatomy was normal.  No labial rashes, erythema, or edema.  Vaginal canal was normal.  Cervix was normal-appearing without any significant friability.  There was moderate amount of  whitish discharge in the vaginal canal and cervical os.  No adnexal tenderness bilaterally.  No cervical motion tenderness bilaterally with bimanual exam. Musculoskeletal:        General: Normal range of motion.     Cervical back: Neck supple.  Skin:    General: Skin is warm  and dry.     Findings: No rash.  Neurological:     General: No focal deficit present.     Mental Status: She is alert.  Psychiatric:        Mood and Affect: Mood normal.        Behavior: Behavior normal.    ED Results / Procedures / Treatments   Labs (all labs ordered are listed, but only abnormal results are displayed) Labs Reviewed  WET PREP, GENITAL - Abnormal; Notable for the following components:      Result Value   Clue Cells Wet Prep HPF POC PRESENT (*)    All other components within normal limits  URINALYSIS, ROUTINE W REFLEX MICROSCOPIC - Abnormal; Notable for the following components:   Specific Gravity, Urine 1.036 (*)    Glucose, UA >1,000 (*)    Nitrite POSITIVE (*)    All other components within normal limits  CBG MONITORING, ED - Abnormal; Notable for the following components:   Glucose-Capillary 332 (*)    All other components within normal limits  URINE CULTURE  GC/CHLAMYDIA PROBE AMP ([place]) NOT AT [facility]    EKG None  Radiology No results found.  Procedures Procedures   Medications Ordered in ED Medications - No data to display  ED Course  I have reviewed the triage vital signs and the nursing notes.  Pertinent labs & imaging results that were available during my care of the patient were reviewed by me and considered in my medical decision making (see chart for details).    MDM Rules/Calculators/A&P                          Gwendolyn Carrillo is a 45 y.o. female who presents the emergency department with vaginal odor and dysuria.  Urinalysis showed positive urinary tract infection.  Given her vaginal symptoms I will perform a pelvic exam and obtain gonorrhea chlamydia  swabs in addition to wet prep.  I am suspicious this is bacterial vaginosis.  Shared decision making was done as the patient is low risk for STIs and we will defer gonorrhea chlamydia treatment until culture returns per patient request.  Wet prep revealed bacterial vaginosis.  Given her findings on urinalysis I will treat her for both.  Plan is to give her metronidazole for BV and cephalexin for urinary tract infection.  All questions and concerns addressed.  Strict return precautions were given.  She is safe for discharge.  Patient will monitor MyChart for gonorrhea and Chlamydia results.   Final Clinical Impression(s) / ED Diagnoses Final diagnoses:  Bacterial vaginosis  Acute cystitis without hematuria    Rx / DC Orders ED Discharge Orders          Ordered    metroNIDAZOLE (FLAGYL) 500 MG tablet  2 times daily        03/24/21 2023    cephALEXin (KEFLEX) 250 MG capsule  4 times daily        03/24/21 2023             Myna Bright [place], [place] 03/24/21 2025    Fredia Sorrow, MD 03/26/21 1920

## 2021-03-24 NOTE — ED Triage Notes (Signed)
Pt arrives to ED with c/o of urinary urgency, vaginal odor. She reports this over the last two days. Vaginal odor is described as "fishy."  Associated symptoms include urinary frequency. She denies dysuria, hematuria. She has been sexually active over last month, w/o protection.

## 2021-03-24 NOTE — Discharge Instructions (Addendum)
You tested positive for bacterial vaginosis and urinary tract infection.  I prescribed you metronidazole for BV and cephalexin for urinary tract infection.  Please take both antibiotics.  You will take the metronidazole for the next 7 days and cephalexin for the next 5 days.  Please return to the emergency room if you experience worsening symptoms, fevers, flank pain, intractable vomiting or diarrhea, or any other concerns you might have.

## 2021-03-24 NOTE — ED Notes (Signed)
RN provided AVS using Teachback Method. Patient verbalizes understanding of Discharge Instructions. Opportunity for Questioning and Answers were provided by RN. Patient Discharged from ED ambulatory to Home via Self.  

## 2021-03-24 NOTE — ED Notes (Signed)
Pelvic cart at bedside. 

## 2021-03-25 ENCOUNTER — Other Ambulatory Visit (INDEPENDENT_AMBULATORY_CARE_PROVIDER_SITE_OTHER): Payer: Self-pay | Admitting: Primary Care

## 2021-03-25 DIAGNOSIS — I10 Essential (primary) hypertension: Secondary | ICD-10-CM

## 2021-03-25 LAB — URINE CULTURE: Culture: NO GROWTH

## 2021-03-25 LAB — GC/CHLAMYDIA PROBE AMP (~~LOC~~) NOT AT ARMC
Chlamydia: NEGATIVE
Comment: NEGATIVE
Comment: NORMAL
Neisseria Gonorrhea: NEGATIVE

## 2021-03-26 ENCOUNTER — Other Ambulatory Visit (INDEPENDENT_AMBULATORY_CARE_PROVIDER_SITE_OTHER): Payer: Self-pay | Admitting: Family Medicine

## 2021-03-27 NOTE — Telephone Encounter (Signed)
Requested medication (s) are due for refill today: NO  Requested medication (s) are on the active medication list: dose inconsistent  Last refill:  02/18/21  Future visit scheduled: 03/31/21  Notes to clinic:   Dose inconsistent with current med list, please assess.       Requested Prescriptions  Pending Prescriptions Disp Refills   LANTUS SOLOSTAR 100 UNIT/ML Solostar Pen [Pharmacy Med Name: LANTUS SOLOSTAR PEN INJ 3ML] 15 mL 1    Sig: ADMINISTER 15 UNITS UNDER THE SKIN DAILY     Endocrinology:  Diabetes - Insulins Failed - 03/26/2021  3:45 AM      Failed - HBA1C is between 0 and 7.9 and within 180 days    Hemoglobin A1C  Date Value Ref Range Status  12/30/2020 8.6 (A) 4.0 - 5.6 % Final   Hgb A1c MFr Bld  Date Value Ref Range Status  08/22/2020 5.9 (H) 4.8 - 5.6 % Final    Comment:    (NOTE) Pre diabetes:          5.7%-6.4%  Diabetes:              >6.4%  Glycemic control for   <7.0% adults with diabetes           Passed - Valid encounter within last 6 months    Recent Outpatient Visits           1 month ago Type 2 diabetes mellitus without complication, with long-term current use of insulin (HCC)   [facility] Lois Huxley, Cornelius Moras, RPH-CPP   2 months ago Primary hypertension   [facility] Grayce Sessions, NP   5 months ago Prediabetes   [facility] Grayce Sessions, NP       Future Appointments             In 4 days Grayce Sessions, NP [facility]

## 2021-03-27 NOTE — Telephone Encounter (Signed)
Requested Prescriptions  Pending Prescriptions Disp Refills  . insulin glargine (LANTUS SOLOSTAR) 100 UNIT/ML Solostar Pen [Pharmacy Med Name: LANTUS SOLOSTAR PEN INJ 3ML] 15 mL 1    Sig: ADMINISTER 15 UNITS UNDER THE SKIN DAILY     Endocrinology:  Diabetes - Insulins Failed - 03/26/2021  3:45 AM      Failed - HBA1C is between 0 and 7.9 and within 180 days    Hemoglobin A1C  Date Value Ref Range Status  12/30/2020 8.6 (A) 4.0 - 5.6 % Final   Hgb A1c MFr Bld  Date Value Ref Range Status  08/22/2020 5.9 (H) 4.8 - 5.6 % Final    Comment:    (NOTE) Pre diabetes:          5.7%-6.4%  Diabetes:              >6.4%  Glycemic control for   <7.0% adults with diabetes          Passed - Valid encounter within last 6 months    Recent Outpatient Visits          1 month ago Type 2 diabetes mellitus without complication, with long-term current use of insulin (HCC)   [facility] Lois Huxley, Cornelius Moras, RPH-CPP   2 months ago Primary hypertension   [facility] Grayce Sessions, NP   5 months ago Prediabetes   [facility] Grayce Sessions, NP      Future Appointments            In 4 days Grayce Sessions, NP [facility]

## 2021-03-31 ENCOUNTER — Other Ambulatory Visit: Payer: Self-pay

## 2021-03-31 ENCOUNTER — Ambulatory Visit (INDEPENDENT_AMBULATORY_CARE_PROVIDER_SITE_OTHER): Payer: 59 | Admitting: Primary Care

## 2021-03-31 ENCOUNTER — Emergency Department (HOSPITAL_BASED_OUTPATIENT_CLINIC_OR_DEPARTMENT_OTHER)
Admission: EM | Admit: 2021-03-31 | Discharge: 2021-03-31 | Disposition: A | Discharge status: Home / Self Care | Payer: Self-pay | Attending: Emergency Medicine | Admitting: Emergency Medicine

## 2021-03-31 ENCOUNTER — Encounter (HOSPITAL_BASED_OUTPATIENT_CLINIC_OR_DEPARTMENT_OTHER): Payer: Self-pay

## 2021-03-31 DIAGNOSIS — Z794 Long term (current) use of insulin: Secondary | ICD-10-CM | POA: Insufficient documentation

## 2021-03-31 DIAGNOSIS — F17210 Nicotine dependence, cigarettes, uncomplicated: Secondary | ICD-10-CM | POA: Insufficient documentation

## 2021-03-31 DIAGNOSIS — I471 Supraventricular tachycardia: Secondary | ICD-10-CM | POA: Insufficient documentation

## 2021-03-31 DIAGNOSIS — I10 Essential (primary) hypertension: Secondary | ICD-10-CM | POA: Insufficient documentation

## 2021-03-31 DIAGNOSIS — E1143 Type 2 diabetes mellitus with diabetic autonomic (poly)neuropathy: Secondary | ICD-10-CM | POA: Insufficient documentation

## 2021-03-31 DIAGNOSIS — E1165 Type 2 diabetes mellitus with hyperglycemia: Secondary | ICD-10-CM | POA: Insufficient documentation

## 2021-03-31 DIAGNOSIS — Z79899 Other long term (current) drug therapy: Secondary | ICD-10-CM | POA: Insufficient documentation

## 2021-03-31 DIAGNOSIS — R739 Hyperglycemia, unspecified: Secondary | ICD-10-CM

## 2021-03-31 LAB — CBC WITH DIFFERENTIAL/PLATELET
Abs Immature Granulocytes: 0.06 K/uL (ref 0.00–0.07)
Basophils Absolute: 0 K/uL (ref 0.0–0.1)
Basophils Relative: 0 %
Eosinophils Absolute: 0.1 K/uL (ref 0.0–0.5)
Eosinophils Relative: 0 %
HCT: 42.3 % (ref 36.0–46.0)
Hemoglobin: 14.8 g/dL (ref 12.0–15.0)
Immature Granulocytes: 0 %
Lymphocytes Relative: 10 %
Lymphs Abs: 1.6 K/uL (ref 0.7–4.0)
MCH: 31 pg (ref 26.0–34.0)
MCHC: 35 g/dL (ref 30.0–36.0)
MCV: 88.5 fL (ref 80.0–100.0)
Monocytes Absolute: 1 K/uL (ref 0.1–1.0)
Monocytes Relative: 7 %
Neutro Abs: 12.6 K/uL — ABNORMAL HIGH (ref 1.7–7.7)
Neutrophils Relative %: 83 %
Platelets: 291 K/uL (ref 150–400)
RBC: 4.78 MIL/uL (ref 3.87–5.11)
RDW: 12.2 % (ref 11.5–15.5)
WBC: 15.3 K/uL — ABNORMAL HIGH (ref 4.0–10.5)
nRBC: 0 % (ref 0.0–0.2)

## 2021-03-31 LAB — I-STAT VENOUS BLOOD GAS, ED
Acid-Base Excess: 2 mmol/L (ref 0.0–2.0)
Bicarbonate: 26 mmol/L (ref 20.0–28.0)
Calcium, Ion: 1.17 mmol/L (ref 1.15–1.40)
HCT: 44 % (ref 36.0–46.0)
Hemoglobin: 15 g/dL (ref 12.0–15.0)
O2 Saturation: 95 %
Potassium: 3.6 mmol/L (ref 3.5–5.1)
Sodium: 133 mmol/L — ABNORMAL LOW (ref 135–145)
TCO2: 27 mmol/L (ref 22–32)
pCO2, Ven: 38.4 mmHg — ABNORMAL LOW (ref 44.0–60.0)
pH, Ven: 7.439 — ABNORMAL HIGH (ref 7.250–7.430)
pO2, Ven: 72 mmHg — ABNORMAL HIGH (ref 32.0–45.0)

## 2021-03-31 LAB — CBG MONITORING, ED
Glucose-Capillary: 377 mg/dL — ABNORMAL HIGH (ref 70–99)
Glucose-Capillary: 307 mg/dL — ABNORMAL HIGH (ref 70–99)

## 2021-03-31 LAB — BETA-HYDROXYBUTYRIC ACID: Beta-Hydroxybutyric Acid: 2.24 mmol/L — ABNORMAL HIGH (ref 0.05–0.27)

## 2021-03-31 LAB — URINALYSIS, ROUTINE W REFLEX MICROSCOPIC
Bilirubin Urine: NEGATIVE
Glucose, UA: >1000 mg/dL — AB
Hgb urine dipstick: NEGATIVE
Ketones, ur: 40 mg/dL — AB
Leukocytes,Ua: NEGATIVE
Nitrite: NEGATIVE
Protein, ur: NEGATIVE mg/dL
Specific Gravity, Urine: 1.027 (ref 1.005–1.030)
pH: 6 (ref 5.0–8.0)

## 2021-03-31 LAB — BASIC METABOLIC PANEL
Anion gap: 17 — ABNORMAL HIGH (ref 5–15)
BUN: 21 mg/dL — ABNORMAL HIGH (ref 6–20)
CO2: 22 mmol/L (ref 22–32)
Calcium: 9.9 mg/dL (ref 8.9–10.3)
Chloride: 93 mmol/L — ABNORMAL LOW (ref 98–111)
Creatinine, Ser: 0.89 mg/dL (ref 0.44–1.00)
GFR, Estimated: >60 mL/min (ref >60)
Glucose, Bld: 421 mg/dL — ABNORMAL HIGH (ref 70–99)
Potassium: 3 mmol/L — ABNORMAL LOW (ref 3.5–5.1)
Sodium: 132 mmol/L — ABNORMAL LOW (ref 135–145)

## 2021-03-31 LAB — MAGNESIUM: Magnesium: 1.4 mg/dL — ABNORMAL LOW (ref 1.7–2.4)

## 2021-03-31 LAB — HCG, SERUM, QUALITATIVE: Preg, Serum: NEGATIVE

## 2021-03-31 MED ORDER — ONDANSETRON HCL 4 MG PO TABS: 4.0000 mg | ORAL_TABLET | Freq: Three times a day (TID) | ORAL | 0 refills | Status: DC | PRN

## 2021-03-31 MED ORDER — SODIUM CHLORIDE 0.9 % IV BOLUS
1000.0000 mL | Freq: Once | INTRAVENOUS | Status: AC
  Administered 2021-03-31: 1000 mL via INTRAVENOUS

## 2021-03-31 MED ORDER — METOPROLOL TARTRATE 25 MG PO TABS: 12.5000 mg | ORAL_TABLET | Freq: Every day | ORAL | 0 refills | Status: DC

## 2021-03-31 MED ORDER — INSULIN GLARGINE-YFGN 100 UNIT/ML ~~LOC~~ SOLN
10.0000 [IU] | Freq: Once | SUBCUTANEOUS | Status: AC
  Administered 2021-03-31: 10 [IU] via SUBCUTANEOUS
  Filled 2021-03-31: qty 10

## 2021-03-31 MED ORDER — POTASSIUM CHLORIDE CRYS ER 20 MEQ PO TBCR
40.0000 meq | EXTENDED_RELEASE_TABLET | Freq: Once | ORAL | Status: AC
  Administered 2021-03-31: 40 meq via ORAL
  Filled 2021-03-31: qty 2

## 2021-03-31 MED ORDER — POTASSIUM CHLORIDE CRYS ER 20 MEQ PO TBCR: 20.0000 meq | EXTENDED_RELEASE_TABLET | Freq: Every day | ORAL | 0 refills | Status: DC

## 2021-03-31 MED ORDER — ADENOSINE 6 MG/2ML IV SOLN: 6.0000 mg | Freq: Once | INTRAVENOUS | Status: AC

## 2021-03-31 MED ORDER — ADENOSINE 6 MG/2ML IV SOLN
INTRAVENOUS | Status: AC
  Administered 2021-03-31: 6 mg via INTRAVENOUS
  Filled 2021-03-31: qty 6

## 2021-03-31 MED ORDER — METOPROLOL TARTRATE 25 MG PO TABS
12.5000 mg | ORAL_TABLET | Freq: Once | ORAL | Status: AC
  Administered 2021-03-31: 12.5 mg via ORAL
  Filled 2021-03-31: qty 1

## 2021-03-31 MED ORDER — ONDANSETRON HCL 4 MG/2ML IJ SOLN
4.0000 mg | Freq: Once | INTRAMUSCULAR | Status: AC
  Administered 2021-03-31: 4 mg via INTRAVENOUS
  Filled 2021-03-31: qty 2

## 2021-03-31 MED ORDER — MAGNESIUM SULFATE 2 GM/50ML IV SOLN
2.0000 g | Freq: Once | INTRAVENOUS | Status: AC
  Administered 2021-03-31: 2 g via INTRAVENOUS
  Filled 2021-03-31: qty 50

## 2021-03-31 NOTE — ED Triage Notes (Signed)
Pt presents to ED from home C/O vomiting since 0200 and "feeling like heart is racing." HR 212 in triage. MD notified, to bedside. Consulting civil engineer at bedside.

## 2021-03-31 NOTE — ED Notes (Signed)
Pt reports headache. MD aware.

## 2021-03-31 NOTE — ED Notes (Signed)
1L NS hung VO Dr. Wallace Cullens

## 2021-03-31 NOTE — Discharge Instructions (Addendum)
Please see pcp in 3 days for repeat potassium level.   Please take your insulin as prescribed

## 2021-03-31 NOTE — ED Provider Notes (Signed)
[facility] Provider Note   CSN: [id number] Arrival date & time: 03/31/21  6063     History Chief Complaint  Patient presents with   Tachycardia    Gwendolyn Carrillo is a 45 y.o. female.  This is a 45 y.o. female with significant medical history as below, including IDDM, HTN, anxiety who presents to the ED with complaint of heart racing.  Onset approximately 2 hours ago, sensation of heart racing, palpitations.  She took her home blood pressure medicine this morning including lisinopril and HCTZ.  Approximately 1 hour prior to arrival.  No improvement of symptoms.  Patient with some mild chest tightness, dyspnea associated with the palpitations.  No fevers, chills, diaphoresis, nausea or vomiting.  She experienced similar symptoms multiple years ago and they resolved after being given an injection, she followed up with cardiology who recommended an ablation which she did not want at the time.  No stimulant use, no IVDU, no amphetamine/methamphetamine/cocaine use, did stop ETOH around 1 wk ago.   The history is provided by the patient. No language interpreter was used.      Past Medical History:  Diagnosis Date   Anxiety    Diabetes mellitus without complication ([place])    Hypertension     Patient Active Problem List   Diagnosis Date Noted   GAD (generalized anxiety disorder) 08/21/2020   Gastroparesis 08/21/2020   Type II diabetes mellitus ([place]) 08/21/2020   Hypertension 08/21/2020   Vitamin D deficiency 08/21/2020   MDD (major depressive disorder), recurrent severe, without psychosis ([place])    S/P appendectomy 05/19/2014    Past Surgical History:  Procedure Laterality Date   APPENDECTOMY     CESAREAN SECTION     ENDOMETRIAL ABLATION       OB History     Gravida  4   Para  4   Term      Preterm      AB      Living         SAB      IAB      Ectopic      Multiple      Live Births              No family history on  file.  Social History   Tobacco Use   Smoking status: Some Days    Types: Cigarettes   Smokeless tobacco: Never   Tobacco comments:    "I smoke when I'm drinking"  Vaping Use   Vaping Use: Never used  Substance Use Topics   Alcohol use: Yes    Comment: occ   Drug use: Yes    Types: Marijuana    Comment: "not very often"    Home Medications Prior to Admission medications   Medication Sig Start Date End Date Taking? Authorizing Provider  busPIRone (BUSPAR) 30 MG tablet Take two tablets twice daily. 02/04/21  Yes Mozingo, Berdie Ogren, NP  Cholecalciferol (VITAMIN D3) 50 MCG (2000 UT) capsule Take 1 capsule (2,000 Units total) by mouth daily. 09/29/20  Yes Kerin Perna, NP  hydrochlorothiazide (HYDRODIURIL) 25 MG tablet Take 1 tablet (25 mg total) by mouth daily. 12/30/20  Yes Kerin Perna, NP  hydrOXYzine (VISTARIL) 50 MG capsule Take 1 capsule (50 mg total) by mouth 3 (three) times daily as needed. 02/04/21  Yes Mozingo, Berdie Ogren, NP  insulin glargine (LANTUS SOLOSTAR) 100 UNIT/ML Solostar Pen ADMINISTER 15 UNITS UNDER THE SKIN DAILY 03/27/21  Yes Oletta Lamas,  Milford Cage, NP  LINZESS 145 MCG CAPS capsule Take 145 mcg by mouth daily as needed (constipation). 08/06/20  Yes [provider]  lisinopril (ZESTRIL) 20 MG tablet Take 1 tablet (20 mg total) by mouth daily. 12/30/20  Yes Kerin Perna, NP  metoprolol tartrate (LOPRESSOR) 25 MG tablet Take 0.5 tablets (12.5 mg total) by mouth daily for 14 days. 03/31/21 04/14/21 Yes Wynona Dove A, DO  metroNIDAZOLE (FLAGYL) 500 MG tablet Take 1 tablet (500 mg total) by mouth 2 (two) times daily. 03/24/21  Yes Raul Del, Conner M, PA-C  ondansetron (ZOFRAN ODT) 4 MG disintegrating tablet Take 1 tablet (4 mg total) by mouth every 8 (eight) hours as needed for nausea or vomiting. 02/18/21  Yes Sherwood Gambler, MD  ondansetron (ZOFRAN) 4 MG tablet Take 1 tablet (4 mg total) by mouth every 8 (eight) hours as needed for nausea  or vomiting. 03/31/21  Yes Wynona Dove A, DO  pantoprazole (PROTONIX) 40 MG tablet Take 1 tablet (40 mg total) by mouth daily. 12/30/20  Yes Kerin Perna, NP  potassium chloride SA (KLOR-CON M) 20 MEQ tablet Take 1 tablet (20 mEq total) by mouth daily. 03/31/21  Yes Wynona Dove A, DO  atorvastatin (LIPITOR) 20 MG tablet Take 1 tablet (20 mg total) by mouth daily. Patient not taking: Reported on 03/31/2021 09/29/20   Kerin Perna, NP  blood glucose meter kit and supplies Dispense based on patient and insurance preference. Use up to four times daily as directed. (FOR ICD-10 E10.9, E11.9). 12/30/20   Kerin Perna, NP  cephALEXin (KEFLEX) 250 MG capsule Take 1 capsule (250 mg total) by mouth 4 (four) times daily. Patient not taking: Reported on 03/31/2021 03/24/21   Hendricks Limes, PA-C  Continuous Blood Gluc Receiver (FREESTYLE LIBRE 2 READER) DEVI Use to check blood sugar TID. 01/09/21   Charlott Rakes, MD  Continuous Blood Gluc Sensor (FREESTYLE LIBRE 2 SENSOR) MISC Use to check blood sugar TID. 01/09/21   Charlott Rakes, MD  Insulin Pen Needle (PEN NEEDLES) 32G X 5 MM MISC Use to inject Lantus (insulin) once daily. 01/09/21   Charlott Rakes, MD  metoCLOPramide (REGLAN) 10 MG tablet Take 1 tablet (10 mg total) by mouth every 6 (six) hours. Patient not taking: Reported on 03/31/2021 01/05/21   Couture, Cortni S, PA-C    Allergies    Patient has no known allergies.  Review of Systems   Review of Systems  Constitutional:  Positive for fatigue. Negative for activity change and fever.  HENT:  Negative for facial swelling and trouble swallowing.   Eyes:  Negative for discharge and redness.  Respiratory:  Positive for shortness of breath. Negative for cough.   Cardiovascular:  Positive for chest pain and palpitations.  Gastrointestinal:  Negative for abdominal pain and nausea.  Genitourinary:  Negative for dysuria and flank pain.  Musculoskeletal:  Negative for back pain and gait  problem.  Skin:  Negative for pallor and rash.  Neurological:  Negative for syncope and headaches.   Physical Exam Updated Vital Signs BP 117/81 (BP Location: Right Arm)   Pulse 91   Temp 98.1 F (36.7 C) (Oral)   Resp 14   Ht '5\' 4"'  (1.626 m)   Wt 86.2 kg   SpO2 96%   BMI 32.61 kg/m   Physical Exam Vitals and nursing note reviewed.  Constitutional:      General: She is not in acute distress.    Appearance: Normal appearance. She is ill-appearing.  HENT:     Head: Normocephalic and atraumatic.     Right Ear: External ear normal.     Left Ear: External ear normal.     Nose: Nose normal.     Mouth/Throat:     Mouth: Mucous membranes are moist.  Eyes:     General: No scleral icterus.       Right eye: No discharge.        Left eye: No discharge.  Cardiovascular:     Rate and Rhythm: Regular rhythm. Tachycardia present.     Pulses: Normal pulses.     Heart sounds: Normal heart sounds.  Pulmonary:     Effort: Pulmonary effort is normal. No respiratory distress.     Breath sounds: Normal breath sounds.  Abdominal:     General: Abdomen is flat.     Tenderness: There is no abdominal tenderness.  Musculoskeletal:        General: Normal range of motion.     Cervical back: Normal range of motion.     Right lower leg: No edema.     Left lower leg: No edema.  Skin:    General: Skin is warm and dry.     Capillary Refill: Capillary refill takes less than 2 seconds.  Neurological:     Mental Status: She is alert.  Psychiatric:        Mood and Affect: Mood normal.        Behavior: Behavior normal.    ED Results / Procedures / Treatments   Labs (all labs ordered are listed, but only abnormal results are displayed) Labs Reviewed  CBC WITH DIFFERENTIAL/PLATELET - Abnormal; Notable for the following components:      Result Value   WBC 15.3 (*)    Neutro Abs 12.6 (*)    All other components within normal limits  BASIC METABOLIC PANEL - Abnormal; Notable for the following  components:   Sodium 132 (*)    Potassium 3.0 (*)    Chloride 93 (*)    Glucose, Bld 421 (*)    BUN 21 (*)    Anion gap 17 (*)    All other components within normal limits  MAGNESIUM - Abnormal; Notable for the following components:   Magnesium 1.4 (*)    All other components within normal limits  URINALYSIS, ROUTINE W REFLEX MICROSCOPIC - Abnormal; Notable for the following components:   Glucose, UA >1,000 (*)    Ketones, ur 40 (*)    All other components within normal limits  BETA-HYDROXYBUTYRIC ACID - Abnormal; Notable for the following components:   Beta-Hydroxybutyric Acid 2.24 (*)    All other components within normal limits  CBG MONITORING, ED - Abnormal; Notable for the following components:   Glucose-Capillary 377 (*)    All other components within normal limits  I-STAT VENOUS BLOOD GAS, ED - Abnormal; Notable for the following components:   pH, Ven 7.439 (*)    pCO2, Ven 38.4 (*)    pO2, Ven 72.0 (*)    Sodium 133 (*)    All other components within normal limits  CBG MONITORING, ED - Abnormal; Notable for the following components:   Glucose-Capillary 307 (*)    All other components within normal limits  HCG, SERUM, QUALITATIVE    EKG EKG Interpretation  Date/Time:  Tuesday March 31 2021 09:33:55 EST Ventricular Rate:  125 PR Interval:  163 QRS Duration: 86 QT Interval:  276 QTC Calculation: 398 R Axis:   86 Text Interpretation: Sinus  tachycardia Consider right atrial enlargement Borderline ST depression, diffuse leads Rate improved from prior Confirmed by Wynona Dove ([id number]) on 03/31/2021 10:06:20 AM  Radiology No results found.  Procedures .Cardioversion  Date/Time: 03/31/2021 9:42 AM Performed by: Jeanell Sparrow, DO Authorized by: Jeanell Sparrow, DO   Consent:    Consent obtained:  Verbal   Consent given by:  Patient   Risks discussed:  Induced arrhythmia   Alternatives discussed:  No treatment, delayed treatment and alternative  treatment Universal protocol:    Procedure explained and questions answered to patient or proxy's satisfaction: yes     Immediately prior to procedure a time out was called: yes     Patient identity confirmed:  Verbally with patient Pre-procedure details:    Cardioversion basis:  Emergent   Rhythm:  Supraventricular tachycardia Patient sedated: No Post-procedure details:    Patient status:  Awake   Patient tolerance of procedure:  Tolerated well, no immediate complications Comments:     Chemical cardioversion with adenosine 47m x1  .Critical Care Performed by: GJeanell Sparrow DO Authorized by: GJeanell Sparrow DO   Critical care provider statement:    Critical care time (minutes):  30   Critical care time was exclusive of:  Separately billable procedures and treating other patients   Critical care was necessary to treat or prevent imminent or life-threatening deterioration of the following conditions:  Cardiac failure   Critical care was time spent personally by me on the following activities:  Development of treatment plan with patient or surrogate, discussions with consultants, evaluation of patient's response to treatment, examination of patient, ordering and review of laboratory studies, ordering and review of radiographic studies, ordering and performing treatments and interventions, pulse oximetry, re-evaluation of patient's condition and review of old charts   Medications Ordered in ED Medications  ondansetron (ZOFRAN) injection 4 mg (4 mg Intravenous Given 03/31/21 0935)  magnesium sulfate IVPB 2 g 50 mL (0 g Intravenous Stopped 03/31/21 1018)  adenosine (ADENOCARD) 6 MG/2ML injection 6 mg (6 mg Intravenous Given 03/31/21 0932)  metoprolol tartrate (LOPRESSOR) tablet 12.5 mg (12.5 mg Oral Given 03/31/21 1014)  sodium chloride 0.9 % bolus 1,000 mL (0 mLs Intravenous Stopped 03/31/21 1102)  potassium chloride SA (KLOR-CON M) CR tablet 40 mEq (40 mEq Oral Given 03/31/21 1117)  insulin  glargine-yfgn (SEMGLEE) injection 10 Units (10 Units Subcutaneous Given 03/31/21 1117)  sodium chloride 0.9 % bolus 1,000 mL (0 mLs Intravenous Stopped 03/31/21 1412)  ondansetron (ZOFRAN) injection 4 mg (4 mg Intravenous Given 03/31/21 1457)    ED Course  I have reviewed the triage vital signs and the nursing notes.  Pertinent labs & imaging results that were available during my care of the patient were reviewed by me and considered in my medical decision making (see chart for details).  Clinical Course as of 03/31/21 1627  Tue Mar 31, 2021  0954 Glucose-Capillary(!): 377 She did not take her morning lantus  [SG]  1006 WBC(!): 15.3 Leukocytosis likely reactive in setting of tachycardia  [SG]    Clinical Course User Index [SG] GJeanell Sparrow DO   MDM Rules/Calculators/A&P                           CC: tachycardia  This patient complains of above; this involves an extensive number of treatment options and is a complaint that carries with it a high risk of complications and morbidity. Vital signs were reviewed. Serious  etiologies considered.  Record review:   Previous records obtained and reviewed   Work up as above, notable for:  Lab results that were available during my care of the patient were reviewed by me and considered in my medical decision making.    Management: Initial EKG with heart rate in excess of 200 consistent with SVT.  Discussed treatment with the patient.  Consented for chemical cardioversion.  NPO.  Last oral intake was approximately 10 hours ago.   Reassessment:  Given 30m adenosine with conversion to sinus tachycardia with rate 100-110. Will give IVF, MgSO4, check labs, also recommend starting low dose beta blocker and f/u with cardiology   Labs reviewed, hyperglycemia improved after giving her home lantus. She has ketonuria but pH is WNL, favor hyperglycemia 2/2 missing insulin this morning and reduced oral intake over the last day or so likely attributed  to increased HR. Recommend pt take her medications as prescribed once she gets home this afternoon and to f/u with cardiology and her PCP. Discussed strict return precautions.  She was given potassium and mag, recommend she f/u with pcp for repeat K and Mg levels in next 2-3 days.  The patient improved significantly and was discharged in stable condition. Detailed discussions were had with the patient regarding current findings, and need for close f/u with PCP or on call doctor. The patient has been instructed to return immediately if the symptoms worsen in any way for re-evaluation. Patient verbalized understanding and is in agreement with current care plan. All questions answered prior to discharge.         This chart was dictated using voice recognition software.  Despite best efforts to proofread,  errors can occur which can change the documentation meaning.  Final Clinical Impression(s) / ED Diagnoses Final diagnoses:  SVT (supraventricular tachycardia) ([place]  Hyperglycemia    Rx / DC Orders ED Discharge Orders          Ordered    metoprolol tartrate (LOPRESSOR) 25 MG tablet  Daily        03/31/21 1447    potassium chloride SA (KLOR-CON M) 20 MEQ tablet  Daily        03/31/21 1447    ondansetron (ZOFRAN) 4 MG tablet  Every 8 hours PRN        03/31/21 1450             GJeanell Sparrow DO 03/31/21 1627

## 2021-03-31 NOTE — ED Notes (Signed)
AVS with prescriptions provided to and discussed with patient. Pt verbalizes understanding of discharge instructions and denies any questions or concerns at this time. Pt ambulated out of department independently with steady gait. ? ?

## 2021-03-31 NOTE — ED Notes (Signed)
Pt provided with socks per request. Pt ambulated to BR with steady gait. Tolerated well.

## 2021-04-01 ENCOUNTER — Ambulatory Visit (INDEPENDENT_AMBULATORY_CARE_PROVIDER_SITE_OTHER): Payer: Self-pay | Admitting: Adult Health

## 2021-04-01 ENCOUNTER — Ambulatory Visit (INDEPENDENT_AMBULATORY_CARE_PROVIDER_SITE_OTHER): Payer: Self-pay | Admitting: Primary Care

## 2021-04-01 ENCOUNTER — Encounter (INDEPENDENT_AMBULATORY_CARE_PROVIDER_SITE_OTHER): Payer: Self-pay | Admitting: Primary Care

## 2021-04-01 ENCOUNTER — Encounter: Payer: Self-pay | Admitting: Adult Health

## 2021-04-01 VITALS — BP 113/81 | HR 85 | Temp 90.7°F | Ht 64.0 in | Wt 188.0 lb

## 2021-04-01 DIAGNOSIS — E119 Type 2 diabetes mellitus without complications: Secondary | ICD-10-CM

## 2021-04-01 DIAGNOSIS — F331 Major depressive disorder, recurrent, moderate: Secondary | ICD-10-CM

## 2021-04-01 DIAGNOSIS — I10 Essential (primary) hypertension: Secondary | ICD-10-CM

## 2021-04-01 DIAGNOSIS — F411 Generalized anxiety disorder: Secondary | ICD-10-CM

## 2021-04-01 DIAGNOSIS — G4700 Insomnia, unspecified: Secondary | ICD-10-CM

## 2021-04-01 DIAGNOSIS — R000 Tachycardia, unspecified: Secondary | ICD-10-CM

## 2021-04-01 DIAGNOSIS — F410 Panic disorder [episodic paroxysmal anxiety] without agoraphobia: Secondary | ICD-10-CM

## 2021-04-01 LAB — POCT GLYCOSYLATED HEMOGLOBIN (HGB A1C): Hemoglobin A1C: 11.9 % — AB (ref 4.0–5.6)

## 2021-04-01 MED ORDER — LISINOPRIL 20 MG PO TABS
20.0000 mg | ORAL_TABLET | Freq: Every day | ORAL | 1 refills | Status: DC
  Filled 2021-04-01: qty 30, 30d supply, fill #0

## 2021-04-01 MED ORDER — HYDROCHLOROTHIAZIDE 25 MG PO TABS
25.0000 mg | ORAL_TABLET | Freq: Every day | ORAL | 1 refills | Status: DC
  Filled 2021-04-01: qty 30, 30d supply, fill #0

## 2021-04-01 MED ORDER — INSULIN ASPART 100 UNIT/ML IJ SOLN: 12.0000 [IU] | Freq: Three times a day (TID) | INTRAMUSCULAR | 99 refills | Status: DC

## 2021-04-01 MED ORDER — LANTUS SOLOSTAR 100 UNIT/ML ~~LOC~~ SOPN: PEN_INJECTOR | SUBCUTANEOUS | 3 refills | Status: DC

## 2021-04-01 MED ORDER — BUSPIRONE HCL 10 MG PO TABS: ORAL_TABLET | ORAL | 2 refills | Status: DC

## 2021-04-01 MED ORDER — LISINOPRIL 20 MG PO TABS: 20.0000 mg | ORAL_TABLET | Freq: Every day | ORAL | 1 refills | Status: DC

## 2021-04-01 MED ORDER — HYDROCHLOROTHIAZIDE 25 MG PO TABS: 25.0000 mg | ORAL_TABLET | Freq: Every day | ORAL | 1 refills | Status: DC

## 2021-04-01 MED ORDER — INSULIN ASPART 100 UNIT/ML IJ SOLN
12.0000 [IU] | Freq: Three times a day (TID) | INTRAMUSCULAR | 99 refills | Status: DC
  Filled 2021-04-02: qty 10, 28d supply, fill #0

## 2021-04-01 MED ORDER — LANTUS SOLOSTAR 100 UNIT/ML ~~LOC~~ SOPN
PEN_INJECTOR | SUBCUTANEOUS | 3 refills | Status: DC
  Filled 2021-04-01: qty 3, 28d supply, fill #0

## 2021-04-01 NOTE — Progress Notes (Signed)
[name] [id number] 19-Dec-1975 45 y.o.  Subjective:   Patient ID:  Gwendolyn Carrillo is a 45 y.o. (DOB Oct 12, 1975) female.  Chief Complaint: No chief complaint on file.   HPI Gwendolyn Carrillo presents to the office today for follow-up of MDD, mixed obsessional thoughts, panic attacks, and GAD.   Describes mood today as "ok". Decreased tearfulness. Pleasant. Mood symptoms - denies depression - "weepy at times". Denies irritability. Reports increased anxiety over the past 2 months. Buspar not helping with current levels of anxiety. Stating "I can't seem to get control of it". Having severe panic attacks - chest pain. Seen in the ED on 03/31/2021 with an increased heart rate - "my heart felt like it was beating out of my chest". Was treated and released - SVT, lowe sodium and potassium levels. Has a follow up with PCP today and a referral to cardiology. Reports stopping both ETOH and THC 10 days. Decreased interest and motivation. Taking medications as prescribed. Seeing therapist.  Energy levels stable. Active, does not have a regular exercise routine.  Able to enjoy usual interests and activities. Single. Dating. Has 4 children 25, 19, 23, and 15. Family local. Spending time with family. Appetite improved. Weight gain - 185 pounds.  Sleeping difficulties - up and down all night. Hydroxyzine not helpful.  Focus and concentration difficulties. Completing tasks. Managing some aspects of household. Starting a new job at [employer] next week.  Denies SI or HI.  Denies AH or VH.     [facility] Office Visit from 12/30/2020 in [facility] Office Visit from 09/29/2020 in [facility]  Total GAD-7 Score 10 6      PHQ2-9    [facility] Office Visit from 12/30/2020 in [facility] Office Visit from 09/29/2020 in [facility] ED from 08/20/2020 in [facility] DEPT   PHQ-2 Total Score '4 5 6  ' PHQ-9 Total Score '11 12 17      ' Flowsheet Row ED from 03/31/2021 in [facility] ED from 03/24/2021 in [facility] ED from 02/18/2021 in [facility]  C-SSRS RISK CATEGORY No Risk No Risk No Risk        Review of Systems:  Review of Systems  Musculoskeletal:  Negative for gait problem.  Neurological:  Negative for tremors.  Psychiatric/Behavioral:         Please refer to HPI   Medications: I have reviewed the patient's current medications.  Current Outpatient Medications  Medication Sig Dispense Refill   atorvastatin (LIPITOR) 20 MG tablet Take 1 tablet (20 mg total) by mouth daily. (Patient not taking: Reported on 03/31/2021) 90 tablet 3   blood glucose meter kit and supplies Dispense based on patient and insurance preference. Use up to four times daily as directed. (FOR ICD-10 E10.9, E11.9). 1 each 0   busPIRone (BUSPAR) 10 MG tablet Take two tablets three times daily for anxiety. 180 tablet 2   cephALEXin (KEFLEX) 250 MG capsule Take 1 capsule (250 mg total) by mouth 4 (four) times daily. (Patient not taking: Reported on 03/31/2021) 20 capsule 0   Cholecalciferol (VITAMIN D3) 50 MCG (2000 UT) capsule Take 1 capsule (2,000 Units total) by mouth daily. 90 capsule 1   Continuous Blood Gluc Receiver (FREESTYLE LIBRE 2 READER) DEVI Use to check blood sugar TID. 1 each 2   Continuous Blood Gluc Sensor (FREESTYLE LIBRE 2  SENSOR) MISC Use to check blood sugar TID. 1 each 2   hydrochlorothiazide (HYDRODIURIL) 25 MG tablet Take 1 tablet (25 mg total) by mouth daily. 90 tablet 1   hydrOXYzine (VISTARIL) 50 MG capsule Take 1 capsule (50 mg total) by mouth 3 (three) times daily as needed. 30 capsule 2   insulin glargine (LANTUS SOLOSTAR) 100 UNIT/ML Solostar Pen ADMINISTER 15 UNITS UNDER THE SKIN DAILY 15 mL 1   Insulin Pen Needle (PEN NEEDLES) 32G X 5 MM MISC Use to inject Lantus (insulin)  once daily. 100 each 1   LINZESS 145 MCG CAPS capsule Take 145 mcg by mouth daily as needed (constipation).     lisinopril (ZESTRIL) 20 MG tablet Take 1 tablet (20 mg total) by mouth daily. 90 tablet 1   metoCLOPramide (REGLAN) 10 MG tablet Take 1 tablet (10 mg total) by mouth every 6 (six) hours. (Patient not taking: Reported on 03/31/2021) 30 tablet 0   metoprolol tartrate (LOPRESSOR) 25 MG tablet Take 0.5 tablets (12.5 mg total) by mouth daily for 14 days. 7 tablet 0   metroNIDAZOLE (FLAGYL) 500 MG tablet Take 1 tablet (500 mg total) by mouth 2 (two) times daily. 14 tablet 0   ondansetron (ZOFRAN ODT) 4 MG disintegrating tablet Take 1 tablet (4 mg total) by mouth every 8 (eight) hours as needed for nausea or vomiting. 10 tablet 0   ondansetron (ZOFRAN) 4 MG tablet Take 1 tablet (4 mg total) by mouth every 8 (eight) hours as needed for nausea or vomiting. 4 tablet 0   pantoprazole (PROTONIX) 40 MG tablet Take 1 tablet (40 mg total) by mouth daily. 90 tablet 0   potassium chloride SA (KLOR-CON M) 20 MEQ tablet Take 1 tablet (20 mEq total) by mouth daily. 3 tablet 0   No current facility-administered medications for this visit.    Medication Side Effects: None  Allergies: No Known Allergies  Past Medical History:  Diagnosis Date   Anxiety    Diabetes mellitus without complication ([place])    Hypertension     Past Medical History, Surgical history, Social history, and Family history were reviewed and updated as appropriate.   Please see review of systems for further details on the patient's review from today.   Objective:   Physical Exam:  There were no vitals taken for this visit.  Physical Exam Constitutional:      General: She is not in acute distress. Musculoskeletal:        General: No deformity.  Neurological:     Mental Status: She is alert and oriented to person, place, and time.     Coordination: Coordination normal.  Psychiatric:        Attention and Perception:  Attention and perception normal. She does not perceive auditory or visual hallucinations.        Mood and Affect: Mood normal. Mood is not anxious or depressed. Affect is not labile, blunt, angry or inappropriate.        Speech: Speech normal.        Behavior: Behavior normal.        Thought Content: Thought content normal. Thought content is not paranoid or delusional. Thought content does not include homicidal or suicidal ideation. Thought content does not include homicidal or suicidal plan.        Cognition and Memory: Cognition and memory normal.        Judgment: Judgment normal.     Comments: Insight intact    Lab Review:  Component Value Date/Time   NA 133 (L) 03/31/2021 1125   K 3.6 03/31/2021 1125   CL 93 (L) 03/31/2021 0940   CO2 22 03/31/2021 0940   GLUCOSE 421 (H) 03/31/2021 0940   BUN 21 (H) 03/31/2021 0940   CREATININE 0.89 03/31/2021 0940   CALCIUM 9.9 03/31/2021 0940   PROT 8.5 (H) 02/18/2021 0831   ALBUMIN 4.5 02/18/2021 0831   AST 28 02/18/2021 0831   ALT 42 02/18/2021 0831   ALKPHOS 78 02/18/2021 0831   BILITOT 0.8 02/18/2021 0831   GFRNONAA >60 03/31/2021 0940   GFRAA >60 10/10/2019 1441       Component Value Date/Time   WBC 15.3 (H) 03/31/2021 0940   RBC 4.78 03/31/2021 0940   HGB 15.0 03/31/2021 1125   HCT 44.0 03/31/2021 1125   PLT 291 03/31/2021 0940   MCV 88.5 03/31/2021 0940   MCH 31.0 03/31/2021 0940   MCHC 35.0 03/31/2021 0940   RDW 12.2 03/31/2021 0940   LYMPHSABS 1.6 03/31/2021 0940   MONOABS 1.0 03/31/2021 0940   EOSABS 0.1 03/31/2021 0940   BASOSABS 0.0 03/31/2021 0940    No results found for: POCLITH, LITHIUM   No results found for: PHENYTOIN, PHENOBARB, VALPROATE, CBMZ   .res Assessment: Plan:    Plan:  PDMP reviewed  Buspar 30 BID to 64m TID - has been taking daily or as needed - discussed regular use and a taper up on medication. Hydroxyzine 580mat hs  Continue therapy - [facility] WiMaricela Bo therapist.  Will  consider low dose benzo for panic attacks - pt to see PCP this afternoon and will call with an update.   RTC 4 weeks  Patien weeks advised to contact office with any questions, adverse effects, or acute worsening in signs and symptoms.  Discussed potential benefits, risk, and side effects of benzodiazepines to include potential risk of tolerance and dependence, as well as possible drowsiness.  Advised patient not to drive if experiencing drowsiness and to take lowest possible effective dose to minimize risk of dependence and tolerance.   Diagnoses and all orders for this visit:  Major depressive disorder, recurrent episode, moderate (HCC)  Generalized anxiety disorder -     busPIRone (BUSPAR) 10 MG tablet; Take two tablets three times daily for anxiety.  Panic attacks -     busPIRone (BUSPAR) 10 MG tablet; Take two tablets three times daily for anxiety.  Insomnia, unspecified type    Please see After Visit Summary for patient specific instructions.  Future Appointments  Date Time Provider [place]12/10/2020  4:10 PM EdKerin [facility]    No orders of the defined types were placed in this encounter.   -------------------------------

## 2021-04-02 ENCOUNTER — Telehealth: Payer: Self-pay | Admitting: Adult Health

## 2021-04-02 ENCOUNTER — Other Ambulatory Visit: Payer: Self-pay | Admitting: Adult Health

## 2021-04-02 ENCOUNTER — Other Ambulatory Visit: Payer: Self-pay

## 2021-04-02 ENCOUNTER — Other Ambulatory Visit (INDEPENDENT_AMBULATORY_CARE_PROVIDER_SITE_OTHER): Payer: Self-pay | Admitting: Primary Care

## 2021-04-02 DIAGNOSIS — F410 Panic disorder [episodic paroxysmal anxiety] without agoraphobia: Secondary | ICD-10-CM

## 2021-04-02 DIAGNOSIS — F411 Generalized anxiety disorder: Secondary | ICD-10-CM

## 2021-04-02 DIAGNOSIS — E782 Mixed hyperlipidemia: Secondary | ICD-10-CM

## 2021-04-02 LAB — LIPID PANEL
Chol/HDL Ratio: 8 ratio — ABNORMAL HIGH (ref 0.0–4.4)
Cholesterol, Total: 225 mg/dL — ABNORMAL HIGH (ref 100–199)
HDL: 28 mg/dL — ABNORMAL LOW (ref >39)
LDL Chol Calc (NIH): 153 mg/dL — ABNORMAL HIGH (ref 0–99)
Triglycerides: 235 mg/dL — ABNORMAL HIGH (ref 0–149)
VLDL Cholesterol Cal: 44 mg/dL — ABNORMAL HIGH (ref 5–40)

## 2021-04-02 MED ORDER — LORAZEPAM 0.5 MG PO TABS
0.5000 mg | ORAL_TABLET | Freq: Every day | ORAL | 0 refills | Status: DC | PRN
Start: 2021-04-02 — End: 2021-04-29

## 2021-04-02 MED ORDER — BUSPIRONE HCL 10 MG PO TABS: ORAL_TABLET | ORAL | 2 refills | Status: DC

## 2021-04-02 MED ORDER — ATORVASTATIN CALCIUM 80 MG PO TABS
80.0000 mg | ORAL_TABLET | Freq: Every day | ORAL | 3 refills | Status: DC
Start: 2021-04-02 — End: 2021-08-14

## 2021-04-02 NOTE — Telephone Encounter (Signed)
Lorazepam 0.5mg  daily - #15 as needed for panic/anxiety sent.

## 2021-04-02 NOTE — Telephone Encounter (Signed)
Pt called reporting she spoke with PCP about starting a low dose of Lorazepam. Pt stated ok to send Rx for Lorazepam to [facility] on [address]) [facility] same pharmacy. Changed road name. Also, Buspar should be sent to SAME PHARMACY. Was sent to [facility]. Pt # [phone]

## 2021-04-02 NOTE — Telephone Encounter (Signed)
Buspar resent to correct pharmacy please send ativan

## 2021-04-02 NOTE — Telephone Encounter (Signed)
Rtc to pt and she reports yes she did see her PCP and they discussed lorazepam. The note is not yet documented but pt reports that from the PCP standpoint she agrees to it but refer back to her mental health provider. She reports she had severe anxiety from stopping "cold [place]" from alcohol, pt was drinking 3 shots daily. Pt reports no seizures, no tremors/shaking. Her anxiety turned into panic attacks which caused her tachycardia. She is being referred to a cardiologist.   Informed pt I would update Gwendolyn Carrillo, she confirms using [address]

## 2021-04-02 NOTE — Telephone Encounter (Signed)
Thanks

## 2021-04-02 NOTE — Telephone Encounter (Signed)
Pt called advising that she spoke to her PCP and they were ok with her getting the refill of Lorazepam.  Pls send to [place],  [facility].  No upcoming appt

## 2021-04-02 NOTE — Telephone Encounter (Signed)
I sent you a message on this earlier

## 2021-04-02 NOTE — Telephone Encounter (Signed)
Noted. See reply to that message.

## 2021-04-02 NOTE — Progress Notes (Signed)
Medication request.

## 2021-04-02 NOTE — Telephone Encounter (Signed)
I was hoping to get more information about the visit - did she get any information about recent ED visit - she was having cardiac issues.

## 2021-04-05 NOTE — Progress Notes (Signed)
Gwendolyn Carrillo is a 45 y.o. female presents for hypertension evaluation, Denies shortness of breath, headaches, chest pain or lower extremity edema, sudden onset, vision changes, unilateral weakness, dizziness, paresthesias.  And the management of type 2 diabetes she has endorsed increased polyuria, polyphagia, polydipsia and no vision changes.  Patient reports adherence with medications.  Dietary habits include: Monitoring carbs and sodium Exercise habits include: Yes walking Family / Social history: None   Past Medical History:  Diagnosis Date   Anxiety    Diabetes mellitus without complication ([place])    Hypertension    Past Surgical History:  Procedure Laterality Date   APPENDECTOMY     CESAREAN SECTION     ENDOMETRIAL ABLATION     No Known Allergies Current Outpatient Medications on File Prior to Visit  Medication Sig Dispense Refill   blood glucose meter kit and supplies Dispense based on patient and insurance preference. Use up to four times daily as directed. (FOR ICD-10 E10.9, E11.9). 1 each 0   cephALEXin (KEFLEX) 250 MG capsule Take 1 capsule (250 mg total) by mouth 4 (four) times daily. 20 capsule 0   Cholecalciferol (VITAMIN D3) 50 MCG (2000 UT) capsule Take 1 capsule (2,000 Units total) by mouth daily. 90 capsule 1   Continuous Blood Gluc Receiver (FREESTYLE LIBRE 2 READER) DEVI Use to check blood sugar TID. 1 each 2   Continuous Blood Gluc Sensor (FREESTYLE LIBRE 2 SENSOR) MISC Use to check blood sugar TID. 1 each 2   hydrOXYzine (VISTARIL) 50 MG capsule Take 1 capsule (50 mg total) by mouth 3 (three) times daily as needed. 30 capsule 2   Insulin Pen Needle (PEN NEEDLES) 32G X 5 MM MISC Use to inject Lantus (insulin) once daily. 100 each 1   LINZESS 145 MCG CAPS capsule Take 145 mcg by mouth daily as needed (constipation).     metoCLOPramide (REGLAN) 10 MG tablet Take 1 tablet (10 mg total) by mouth every 6 (six) hours. 30 tablet 0    metoprolol tartrate (LOPRESSOR) 25 MG tablet Take 0.5 tablets (12.5 mg total) by mouth daily for 14 days. 7 tablet 0   metroNIDAZOLE (FLAGYL) 500 MG tablet Take 1 tablet (500 mg total) by mouth 2 (two) times daily. 14 tablet 0   ondansetron (ZOFRAN ODT) 4 MG disintegrating tablet Take 1 tablet (4 mg total) by mouth every 8 (eight) hours as needed for nausea or vomiting. 10 tablet 0   ondansetron (ZOFRAN) 4 MG tablet Take 1 tablet (4 mg total) by mouth every 8 (eight) hours as needed for nausea or vomiting. 4 tablet 0   pantoprazole (PROTONIX) 40 MG tablet Take 1 tablet (40 mg total) by mouth daily. 90 tablet 0   potassium chloride SA (KLOR-CON M) 20 MEQ tablet Take 1 tablet (20 mEq total) by mouth daily. 3 tablet 0   No current facility-administered medications on file prior to visit.   Social History   Socioeconomic History   Marital status: Single    Spouse name: Not on file   Number of children: Not on file   Years of education: Not on file   Highest education level: Not on file  Occupational History   Not on file  Tobacco Use   Smoking status: Some Days    Types: Cigarettes   Smokeless tobacco: Never   Tobacco comments:    "I smoke when I'm drinking"  Vaping Use   Vaping Use: Never used  Substance and Sexual  Activity   Alcohol use: Yes    Comment: occ   Drug use: Yes    Types: Marijuana    Comment: "not very often"   Sexual activity: Not Currently  Other Topics Concern   Not on file  Social History Narrative   Not on file   Social Determinants of Health   Financial Resource Strain: Not on file  Food Insecurity: Not on file  Transportation Needs: Not on file  Physical Activity: Not on file  Stress: Not on file  Social Connections: Not on file  Intimate Partner Violence: Not on file   History reviewed. No pertinent family history.   OBJECTIVE:  Vitals:   04/01/21 1625  BP: 113/81  Pulse: 85  Temp: (!) 90.7 F (32.6 C)  TempSrc: Temporal  SpO2: 97%   Weight: 188 lb (85.3 kg)  Height: _0  (1.626 m)   Physical exam: General: Vital signs reviewed.  Patient is well-developed and well-nourished, morbid obese female in no acute distress and cooperative with exam. Head: Normocephalic and atraumatic. Eyes: EOMI, conjunctivae normal, no scleral icterus. Neck: Supple, trachea midline, normal ROM, no JVD, masses, thyromegaly, or carotid bruit present. Cardiovascular: RRR, S1 normal, S2 normal, no murmurs, gallops, or rubs. Pulmonary/Chest: Clear to auscultation bilaterally, no wheezes, rales, or rhonchi. Abdominal: Soft, non-tender, non-distended, BS +, no masses, organomegaly, or guarding present. Musculoskeletal: No joint deformities, erythema, or stiffness, ROM full and nontender. Extremities: No lower extremity edema bilaterally,  pulses symmetric and intact bilaterally. No cyanosis or clubbing. Neurological: A&O x3, Strength is normal Skin: Warm, dry and intact. No rashes or erythema. Psychiatric: Normal mood and affect. speech and behavior is normal. Cognition and memory are normal.     ROS Comprehensive review of systems noted in HPI pertinent negatives and positives Last 3 Office BP readings: BP Readings from Last 3 Encounters:  04/01/21 113/81  03/31/21 117/81  03/24/21 (!) 166/98    BMET    Component Value Date/Time   NA 133 (L) 03/31/2021 1125   K 3.6 03/31/2021 1125   CL 93 (L) 03/31/2021 0940   CO2 22 03/31/2021 0940   GLUCOSE 421 (H) 03/31/2021 0940   BUN 21 (H) 03/31/2021 0940   CREATININE 0.89 03/31/2021 0940   CALCIUM 9.9 03/31/2021 0940   GFRNONAA >60 03/31/2021 0940   GFRAA >60 10/10/2019 1441    Renal function: Estimated Creatinine Clearance: 86.1 mL/min (by C-G formula based on SCr of 0.89 mg/dL).  Clinical ASCVD: Yes  The 10-year ASCVD risk score (Arnett DK, et al., 2019) is: 20.4%   Values used to calculate the score:     Age: 62 years     Sex: Female     Is Non-Hispanic African American: Yes      Diabetic: Yes     Tobacco smoker: Yes     Systolic Blood Pressure: 704 mmHg     Is BP treated: Yes     HDL Cholesterol: 28 mg/dL     Total Cholesterol: 225 mg/dL  ASCVD risk factors include- [place]   ASSESSMENT & PLAN:   Meds ordered this encounter  Medications   DISCONTD: insulin glargine (LANTUS SOLOSTAR) 100 UNIT/ML Solostar Pen    Sig: ADMINISTER 15 UNITS UNDER THE SKIN DAILY    Dispense:  15 mL    Refill:  3   DISCONTD: lisinopril (ZESTRIL) 20 MG tablet    Sig: Take 1 tablet (20 mg total) by mouth daily.    Dispense:  90 tablet  Refill:  1   DISCONTD: hydrochlorothiazide (HYDRODIURIL) 25 MG tablet    Sig: Take 1 tablet (25 mg total) by mouth daily.    Dispense:  90 tablet    Refill:  1   DISCONTD: insulin aspart (NOVOLOG) 100 UNIT/ML injection    Sig: Inject 12 Units into the skin 3 (three) times daily before meals. For blood sugars 0-150 give 0 units of insulin, 201-250 give 4 units, 251-300 give 6 units, 301-350 give 8 units, 351-400 give 10 units,> 400 give 12 units and call M.D. Discussed hypoglycemia protocol.    Dispense:  10 mL    Refill:  PRN   insulin aspart (NOVOLOG) 100 UNIT/ML injection    Sig: Inject MAX OF 12 Units into the skin 3 (three) times daily before meals. For blood sugars 0-150 give 0 units of insulin, 201-250 give 4 units, 251-300 give 6 units, 301-350 give 8 units, 351-400 give 10 units,> 400 give 12 units and call M.D. Discussed hypoglycemia protocol.    Dispense:  10 mL    Refill:  PRN   hydrochlorothiazide (HYDRODIURIL) 25 MG tablet    Sig: Take 1 tablet (25 mg total) by mouth daily.    Dispense:  90 tablet    Refill:  1   lisinopril (ZESTRIL) 20 MG tablet    Sig: Take 1 tablet (20 mg total) by mouth daily.    Dispense:  90 tablet    Refill:  1   insulin glargine (LANTUS SOLOSTAR) 100 UNIT/ML Solostar Pen    Sig: INJECT 15 UNITS INTO THE SKIN DAILY    Dispense:  15 mL    Refill:  3   Aminat was seen today for diabetes and  hypertension.  Diagnoses and all orders for this visit:  Type 2 diabetes mellitus without complication, without long-term current use of insulin (HCC) -     HgB A1c 11.9 and increase reviewed medications adjustments made see below understands and went over Discussed  co- morbidities with uncontrol diabetes  Complications -diabetic retinopathy, (close your eyes ? What do you see nothing) nephropathy decrease in kidney function- can lead to dialysis-on a machine 3 days a week to filter your kidney, neuropathy- numbness and tinging in your hands and feet,  increase risk of heart attack and stroke, and amputation due to decrease wound healing and circulation. Decrease your risk by taking medication daily as prescribed, monitor carbohydrates- foods that are high in carbohydrates are the following rice, potatoes, breads, sugars, and pastas.  Reduction in the intake (eating) will assist in lowering your blood sugars. Exercise daily at least 30 minutes daily.  -     Lipid Panel -     Discontinue: insulin glargine (LANTUS SOLOSTAR) 100 UNIT/ML Solostar Pen; ADMINISTER 15 UNITS UNDER THE SKIN DAILY -     Discontinue: insulin aspart (NOVOLOG) 100 UNIT/ML injection; Inject 12 Units into the skin 3 (three) times daily before meals. For blood sugars 0-150 give 0 units of insulin, 201-250 give 4 units, 251-300 give 6 units, 301-350 give 8 units, 351-400 give 10 units,> 400 give 12 units and call M.D. Discussed hypoglycemia protocol. -     insulin aspart (NOVOLOG) 100 UNIT/ML injection; Inject MAX OF 12 Units into the skin 3 (three) times daily before meals. For blood sugars 0-150 give 0 units of insulin, 201-250 give 4 units, 251-300 give 6 units, 301-350 give 8 units, 351-400 give 10 units,> 400 give 12 units and call M.D. Discussed hypoglycemia protocol. -  insulin glargine (LANTUS SOLOSTAR) 100 UNIT/ML Solostar Pen; INJECT 15 UNITS INTO THE SKIN DAILY  Tachycardia Spoke with cardiologist will refer -      Ambulatory referral to Cardiology   Primary hypertension -Counseled on lifestyle modifications for blood pressure control including reduced dietary sodium, increased exercise, weight reduction and adequate sleep. Also, educated patient about the risk for cardiovascular events, stroke and heart attack. Also counseled patient about the importance of medication adherence. If you participate in smoking, it is important to stop using tobacco as this will increase the risks associated with uncontrolled blood pressure.   -Hypertension longstanding diagnosed currently after chlorothiazide 25 mg and lisinopril 20 mg both taken daily on current medications. Patient is adherent with current medications.   Goal BP:  For patients younger than 60: Goal BP < 130/80. For patients 60 and older: Goal BP < 140/90. For patients with diabetes: Goal BP < 130/80. Your most recent BP: 113/81  Minimize salt intake. Minimize alcohol intake    This note has been created with Surveyor, quantity. Any transcriptional errors are unintentional.   Kerin Perna, NP 04/05/2021, 1:23 PM

## 2021-04-28 ENCOUNTER — Other Ambulatory Visit: Payer: Self-pay | Admitting: Adult Health

## 2021-04-28 ENCOUNTER — Telehealth (INDEPENDENT_AMBULATORY_CARE_PROVIDER_SITE_OTHER): Payer: Self-pay | Admitting: Primary Care

## 2021-04-28 DIAGNOSIS — F410 Panic disorder [episodic paroxysmal anxiety] without agoraphobia: Secondary | ICD-10-CM

## 2021-04-28 DIAGNOSIS — F411 Generalized anxiety disorder: Secondary | ICD-10-CM

## 2021-04-28 DIAGNOSIS — E119 Type 2 diabetes mellitus without complications: Secondary | ICD-10-CM

## 2021-04-28 NOTE — Telephone Encounter (Signed)
Copied from [place] [id number]. Topic: Quick Communication - Rx Refill/Question >> Apr 28, 2021  4:39 PM Tessa Lerner A wrote: Medication: Rx #: [id number]  insulin glargine (LANTUS SOLOSTAR) 100 UNIT/ML Solostar Pen [id number]   Diabetic testing supplies, test strips, needles   Has the patient contacted their pharmacy? Yes.   (Agent: If no, request that the patient contact the pharmacy for the refill. If patient does not wish to contact the pharmacy document the reason why and proceed with request.) (Agent: If yes, when and what did the pharmacy advise?)  Preferred Pharmacy (with phone number or street name): [id number] [address], [place]  Phone:  [phone] Fax:  [phone]    Has the patient been seen for an appointment in the last year OR does the patient have an upcoming appointment? Yes.    Agent: Please be advised that RX refills may take up to 3 business days. We ask that you follow-up with your pharmacy.

## 2021-04-29 NOTE — Telephone Encounter (Signed)
Pt called to report she is not sleeping. Anxiety is so bad. Apt 1/12 and canc list. Started new job and can't be out. Contact # [phone]

## 2021-04-30 ENCOUNTER — Other Ambulatory Visit (INDEPENDENT_AMBULATORY_CARE_PROVIDER_SITE_OTHER): Payer: Self-pay | Admitting: Primary Care

## 2021-04-30 MED ORDER — FREESTYLE LIBRE 2 SENSOR MISC: 2 refills | Status: DC

## 2021-04-30 MED ORDER — BLOOD GLUCOSE METER KIT: PACK | 0 refills | Status: DC

## 2021-04-30 MED ORDER — PEN NEEDLES 32G X 5 MM MISC: 1 refills | Status: DC

## 2021-04-30 MED ORDER — LANTUS SOLOSTAR 100 UNIT/ML ~~LOC~~ SOPN: PEN_INJECTOR | SUBCUTANEOUS | 3 refills | Status: DC

## 2021-04-30 NOTE — Telephone Encounter (Signed)
Next OV 07/01/21. Approved per protocol. Routing to updated pharmacy today. Requested Prescriptions  Pending Prescriptions Disp Refills   Insulin Pen Needle (PEN NEEDLES) 32G X 5 MM MISC 100 each 1    Sig: Use to inject Lantus (insulin) once daily.     Endocrinology: Diabetes - Testing Supplies Passed - 04/28/2021  7:04 PM      Passed - Valid encounter within last 12 months    Recent Outpatient Visits          4 weeks ago Type 2 diabetes mellitus without complication, without long-term current use of insulin ([place])   [place] [facility] Kerin Perna, NP   3 months ago Type 2 diabetes mellitus without complication, with long-term current use of insulin [facility])   [place], Jarome Matin, RPH-CPP   4 months ago Primary hypertension   [place], Michelle P, NP   7 months ago Prediabetes   [place] Kerin Perna, NP      Future Appointments            In 1 week Pat[place]han, Reynold Bowen, MD [facility]   In 2 months [place], Milford Cage, NP [facility]            insulin glargine (LANTUS SOLOSTAR) 100 UNIT/ML Solostar Pen 15 mL 3    Sig: INJECT 15 UNITS INTO THE SKIN DAILY     Endocrinology:  Diabetes - Insulins Failed - 04/28/2021  7:04 PM      Failed - HBA1C is between 0 and 7.9 and within 180 days    Hemoglobin A1C  Date Value Ref Range Status  04/01/2021 11.9 (A) 4.0 - 5.6 % Final   Hgb A1c MFr Bld  Date Value Ref Range Status  08/22/2020 5.9 (H) 4.8 - 5.6 % Final    Comment:    (NOTE) Pre diabetes:          5.7%-6.4%  Diabetes:              >6.4%  Glycemic control for   <7.0% adults with diabetes          Passed - Valid encounter within last 6 months    Recent Outpatient Visits          4 weeks ago Type 2 diabetes mellitus without complication, without long-term current use of insulin ([place])   [place]  [facility] Kerin Perna, NP   3 months ago Type 2 diabetes mellitus without complication, with long-term current use of insulin ([place])   [place], Jarome Matin, RPH-CPP   4 months ago Primary hypertension   [place], Michelle P, NP   7 months ago Prediabetes   [place] Kerin Perna, NP      Future Appointments            In 1 week Patwardhan, Reynold Bowen, MD [facility]   In 2 months Kerin Perna, NP [facility]            Continuous Blood Gluc Sensor (FREESTYLE LIBRE 2 SENSOR) MISC 1 each 2    Sig: Use to check blood sugar TID.     Endocrinology: Diabetes - Testing Supplies Passed - 04/28/2021  7:04 PM      Passed - Valid encounter within  last 12 months    Recent Outpatient Visits          4 weeks ago Type 2 diabetes mellitus without complication, without long-term current use of insulin ([place])   [place] [facility] Kerin Perna, NP   3 months ago Type 2 diabetes mellitus without complication, with long-term current use of insulin [facility])   [place], Jarome Matin, RPH-CPP   4 months ago Primary hypertension   [place], Michelle P, NP   7 months ago Prediabetes   [place] Kerin Perna, NP      Future Appointments            In 1 week Patwardhan, Reynold Bowen, MD [facility]   In 2 months Oletta Lamas, Milford Cage, NP [facility]            blood glucose meter kit and supplies 1 each 0    Sig: Dispense based on patient and insurance preference. Use up to four times daily as directed. (FOR ICD-10 E10.9, E11.9).     Endocrinology: Diabetes - Testing Supplies Passed - 04/28/2021  7:04 PM      Passed - Valid encounter within last 12 months     Recent Outpatient Visits          4 weeks ago Type 2 diabetes mellitus without complication, without long-term current use of insulin ([place])   [place] [facility] Kerin Perna, NP   3 months ago Type 2 diabetes mellitus without complication, with long-term current use of insulin [facility])   [place], Jarome Matin, RPH-CPP   4 months ago Primary hypertension   [place], Michelle P, NP   7 months ago Prediabetes   [place] Kerin Perna, NP      Future Appointments            In 1 week Patwardhan, Reynold Bowen, MD [facility]   In 2 months Kerin Perna, NP [place]

## 2021-04-30 NOTE — Telephone Encounter (Signed)
Sent to PCP ?

## 2021-05-01 ENCOUNTER — Other Ambulatory Visit: Payer: Self-pay

## 2021-05-01 ENCOUNTER — Other Ambulatory Visit (HOSPITAL_COMMUNITY): Payer: Self-pay

## 2021-05-04 NOTE — Telephone Encounter (Signed)
Pt called back to report that she was told that her PCP needs to approve this for her prior to receiving this. Please advise

## 2021-05-07 ENCOUNTER — Ambulatory Visit (INDEPENDENT_AMBULATORY_CARE_PROVIDER_SITE_OTHER): Payer: BC Managed Care – PPO | Admitting: Adult Health

## 2021-05-07 ENCOUNTER — Other Ambulatory Visit (INDEPENDENT_AMBULATORY_CARE_PROVIDER_SITE_OTHER): Payer: Self-pay | Admitting: Primary Care

## 2021-05-07 ENCOUNTER — Encounter: Payer: Self-pay | Admitting: Adult Health

## 2021-05-07 ENCOUNTER — Telehealth (INDEPENDENT_AMBULATORY_CARE_PROVIDER_SITE_OTHER): Payer: Self-pay | Admitting: Primary Care

## 2021-05-07 ENCOUNTER — Other Ambulatory Visit: Payer: Self-pay

## 2021-05-07 DIAGNOSIS — F410 Panic disorder [episodic paroxysmal anxiety] without agoraphobia: Secondary | ICD-10-CM

## 2021-05-07 DIAGNOSIS — F411 Generalized anxiety disorder: Secondary | ICD-10-CM

## 2021-05-07 DIAGNOSIS — F422 Mixed obsessional thoughts and acts: Secondary | ICD-10-CM

## 2021-05-07 DIAGNOSIS — E119 Type 2 diabetes mellitus without complications: Secondary | ICD-10-CM

## 2021-05-07 DIAGNOSIS — G4700 Insomnia, unspecified: Secondary | ICD-10-CM

## 2021-05-07 DIAGNOSIS — F331 Major depressive disorder, recurrent, moderate: Secondary | ICD-10-CM | POA: Diagnosis not present

## 2021-05-07 MED ORDER — ALPRAZOLAM 0.5 MG PO TABS: 0.5000 mg | ORAL_TABLET | Freq: Two times a day (BID) | ORAL | 0 refills | Status: DC | PRN

## 2021-05-07 MED ORDER — BASAGLAR KWIKPEN 100 UNIT/ML ~~LOC~~ SOPN: 18.0000 [IU] | PEN_INJECTOR | Freq: Every day | SUBCUTANEOUS | 3 refills | Status: DC

## 2021-05-07 NOTE — Progress Notes (Addendum)
PERCY COMP [id number] [birth year] 46 y.o.  Subjective:   Patient ID:  Gwendolyn Carrillo is a 46 y.o. (DOB 18-Apr-1976) female.  Chief Complaint: No chief complaint on file.   HPI Gwendolyn Carrillo presents to the office today for follow-up of MDD, mixed obsessional thoughts, panic attacks, and GAD.   Describes mood today as "ok". Decreased tearfulness. Pleasant. Mood symptoms - denies depression - "a little bit of sadness". Denies irritability. Increased anxiety - "all day every day" Can't find any feelings of Comfort". Reports panic attacks. Feels like the increased anxiety could be hormonally driven, usually lasting a week and a half before symptoms remit. Mind racing at times. Stating "I have never felt like this - I just want it to go away". Also stating "I feel like I'm in a fight or flight state". Buspar not helping with current levels of anxiety. Has a follow with cardiology next week. Reports stopping both ETOH and THC 2 months ago. Decreased interest and motivation. Taking medications as prescribed. Seeing therapist.  Energy levels stable. Active, does not have a regular exercise routine.  Able to enjoy usual interests and activities. Single. Dating. Has 4 children 25, 19, 23, and 15. Family local. Spending time with family. Appetite improved. Weight  loss - 178 from 185 pounds.  Sleeping difficulties. Averages 6 to 7 hours. Focus and concentration difficulties. Completing tasks. Managing some aspects of household. Started a new job at [employer] - going well. Denies SI or HI.  Denies AH or VH.   GAD-7    Flowsheet Row Office Visit from 04/01/2021 in [facility] Office Visit from 12/30/2020 in [facility] Office Visit from 09/29/2020 in [facility]  Total GAD-7 Score 16 10 6       PHQ2-9    Flowsheet Row Office Visit from 04/01/2021 in [facility] Office Visit from 12[facility]30/2020 in [facility]  [facility] Office Visit from 09/29/2020 in [facility] ED from 08/20/2020 in [facility]  PHQ-2 Total Score 3 4 5 6   PHQ-9 Total Score 13 11 12 17       Flowsheet Row ED from 03/31/2021 in [facility] ED from 03/24/2021 in [facility] ED from 02/18/2021 in [facility]  C-SSRS RISK CATEGORY No Risk No Risk No Risk        Review of Systems:  Review of Systems  Musculoskeletal:  Negative for gait problem.  Neurological:  Negative for tremors.  Psychiatric/Behavioral:         Please refer to HPI   Medications: I have reviewed the patient's current medications.  Current Outpatient Medications  Medication Sig Dispense Refill   ALPRAZolam (XANAX) 0.5 MG tablet Take 1 tablet (0.5 mg total) by mouth 2 (two) times daily as needed for anxiety. 28 tablet 0   LORazepam (ATIVAN) 0.5 MG tablet TAKE 1 TABLET BY MOUTH ONCE DAILY AS NEEDED FOR ANXIETY 15 tablet 0   atorvastatin (LIPITOR) 80 MG tablet Take 1 tablet (80 mg total) by mouth daily. 90 tablet 3   busPIRone (BUSPAR) 10 MG tablet Take two tablets three times daily for anxiety. 180 tablet 2   cephALEXin (KEFLEX) 250 MG capsule Take 1 capsule (250 mg total) by mouth 4 (four) times daily. 20 capsule 0   Cholecalciferol (VITAMIN D3) 50 MCG (2000 UT) capsule Take 1 capsule (2,000 Units total) by mouth daily. 90 capsule  1   Continuous Blood Gluc Receiver (FREESTYLE LIBRE 2 READER) DEVI Use to check blood sugar TID. 1 each 2   Continuous Blood Gluc Sensor (FREESTYLE LIBRE 2 SENSOR) MISC Use to check blood sugar TID. 1 each 2   glucose blood (ACCU-CHEK GUIDE) test strip Check blood sugars 3 times a day before meals 100 strip 3   hydrochlorothiazide (HYDRODIURIL) 25 MG tablet Take 1 tablet (25 mg total) by mouth daily. 90 tablet 1   hydrOXYzine (VISTARIL) 50 MG capsule Take 1 capsule (50 mg total)  by mouth 3 (three) times daily as needed. 30 capsule 2   insulin aspart (NOVOLOG) 100 UNIT/ML injection Inject MAX OF 12 Units into the skin 3 (three) times daily before meals. For blood sugars 0-150 give 0 units of insulin, 201-250 give 4 units, 251-300 give 6 units, 301-350 give 8 units, 351-400 give 10 units,> 400 give 12 units and call M.D. Discussed hypoglycemia protocol. 10 mL PRN   Insulin Glargine (BASAGLAR KWIKPEN) 100 UNIT/ML Inject 18 Units into the skin daily. 3 mL 3   Insulin Pen Needle (PEN NEEDLES) 32G X 5 MM MISC Use to inject Lantus (insulin) once daily. 100 each 1   LINZESS 145 MCG CAPS capsule Take 145 mcg by mouth daily as needed (constipation).     lisinopril (ZESTRIL) 20 MG tablet Take 1 tablet (20 mg total) by mouth daily. 90 tablet 1   metoCLOPramide (REGLAN) 10 MG tablet Take 1 tablet (10 mg total) by mouth every 6 (six) hours. 30 tablet 0   metoprolol tartrate (LOPRESSOR) 25 MG tablet Take 0.5 tablets (12.5 mg total) by mouth daily for 14 days. 7 tablet 0   metroNIDAZOLE (FLAGYL) 500 MG tablet Take 1 tablet (500 mg total) by mouth 2 (two) times daily. 14 tablet 0   ondansetron (ZOFRAN ODT) 4 MG disintegrating tablet Take 1 tablet (4 mg total) by mouth every 8 (eight) hours as needed for nausea or vomiting. 10 tablet 0   ondansetron (ZOFRAN) 4 MG tablet Take 1 tablet (4 mg total) by mouth every 8 (eight) hours as needed for nausea or vomiting. 4 tablet 0   pantoprazole (PROTONIX) 40 MG tablet Take 1 tablet (40 mg total) by mouth daily. 90 tablet 0   potassium chloride SA (KLOR-CON M) 20 MEQ tablet Take 1 tablet (20 mEq total) by mouth daily. 3 tablet 0   No current facility-administered medications for this visit.    Medication Side Effects: None  Allergies: No Known Allergies  Past Medical History:  Diagnosis Date   Anxiety    Diabetes mellitus without complication (HCC)    Hypertension     Past Medical History, Surgical history, Social history, and Family  history were reviewed and updated as appropriate.   Please see review of systems for further details on the patient's review from today.   Objective:   Physical Exam:  There were no vitals taken for this visit.  Physical Exam Constitutional:      General: She is not in acute distress. Musculoskeletal:        General: No deformity.  Neurological:     Mental Status: She is alert and oriented to person, place, and time.     Coordination: Coordination normal.  Psychiatric:        Attention and Perception: Attention and perception normal. She does not perceive auditory or visual hallucinations.        Mood and Affect: Mood normal. Mood is not anxious or depressed. Affect is  not labile, blunt, angry or inappropriate.        Speech: Speech normal.        Behavior: Behavior normal.        Thought Content: Thought content normal. Thought content is not paranoid or delusional. Thought content does not include homicidal or suicidal ideation. Thought content does not include homicidal or suicidal plan.        Cognition and Memory: Cognition and memory normal.        Judgment: Judgment normal.     Comments: Insight intact    Lab Review:     Component Value Date/Time   NA 133 (L) 03/31/2021 1125   K 3.6 03/31/2021 1125   CL 93 (L) 03/31/2021 0940   CO2 22 03/31/2021 0940   GLUCOSE 421 (H) 03/31/2021 0940   BUN 21 (H) 03/31/2021 0940   CREATININE 0.89 03/31/2021 0940   CALCIUM 9.9 03/31/2021 0940   PROT 8.5 (H) 02/18/2021 0831   ALBUMIN 4.5 02/18/2021 0831   AST 28 02/18/2021 0831   ALT 42 02/18/2021 0831   ALKPHOS 78 02/18/2021 0831   BILITOT 0.8 02/18/2021 0831   GFRNONAA >60 03/31/2021 0940   GFRAA >60 10/10/2019 1441       Component Value Date/Time   WBC 15.3 (H) 03/31/2021 0940   RBC 4.78 03/31/2021 0940   HGB 15.0 03/31/2021 1125   HCT 44.0 03/31/2021 1125   PLT 291 03/31/2021 0940   MCV 88.5 03/31/2021 0940   MCH 31.0 03/31/2021 0940   MCHC 35.0 03/31/2021 0940    RDW 12.2 03/31/2021 0940   LYMPHSABS 1.6 03/31/2021 0940   MONOABS 1.0 03/31/2021 0940   EOSABS 0.1 03/31/2021 0940   BASOSABS 0.0 03/31/2021 0940    No results found for: POCLITH, LITHIUM   No results found for: PHENYTOIN, PHENOBARB, VALPROATE, CBMZ   .res Assessment: Plan:     Plan:  PDMP reviewed  Buspar 20mg  TID  Hydroxyzine 50mg  at hs Add Xanax 0.5mg  BID for severe anxiety D/C Ativan 0.5mg  daily as needed  Continue therapy - [facility] - therapist.  RTC 4 weeks  Patient weeks advised to contact office with any questions, adverse effects, or acute worsening in signs and symptoms.  Discussed potential benefits, risk, and side effects of benzodiazepines to include potential risk of tolerance and dependence, as well as possible drowsiness.  Advised patient not to drive if experiencing drowsiness and to take lowest possible effective dose to minimize risk of dependence and tolerance.  Diagnoses and all orders for this visit:  Panic attacks -     ALPRAZolam (XANAX) 0.5 MG tablet; Take 1 tablet (0.5 mg total) by mouth 2 (two) times daily as needed for anxiety.  Generalized anxiety disorder  Major depressive disorder, recurrent episode, moderate (HCC)  Insomnia, unspecified type  Mixed obsessional thoughts and acts     Please see After Visit Summary for patient specific instructions.  Future Appointments  Date Time Provider Department Center  05/13/2021  9:00 AM Patwardhan, Clinton Sawyer, MD [facility] None  05/18/2021 11:40 AM Remo Kirschenmann, Anabel Bene, NP [facility] None  07/01/2021  9:10 AM Thereasa Solo, NP [facility] None    No orders of the defined types were placed in this encounter.   -------------------------------

## 2021-05-07 NOTE — Telephone Encounter (Signed)
Please read pharmacy comment

## 2021-05-07 NOTE — Telephone Encounter (Signed)
Pt called saying she is on day 3 with out her insulin.  The pharmacy is saying provider may want to change prescription.  Pt also states for the strips th ins will only cover 50 for 30 days.  That Rx will need to be changed accordingly   CB#  [phone]

## 2021-05-07 NOTE — Telephone Encounter (Signed)
Meds change and sent

## 2021-05-07 NOTE — Telephone Encounter (Signed)
Copied from [place] [id number]. Topic: General - Other >> May 07, 2021 11:13 AM Leward Quan A wrote: Reason for CRM: Patient called in to inquire of Ms Oletta Lamas to get her Rx for glucose blood (ACCU-CHEK GUIDE) test strip to written for 300 strips per insurance they will pay and she also need and Rx also for the Dexcom G6 CGM System sent in to the pharmacy also states that  the insurance will pay. Any question please call  Ph# [phone]

## 2021-05-07 NOTE — Telephone Encounter (Signed)
Insurance would not pay for Lantus change to BASAGLAR 18 units daily

## 2021-05-07 NOTE — Addendum Note (Signed)
Addended by: Aloha Gell on: 05/07/2021 02:47 PM   Modules accepted: Level of Service

## 2021-05-07 NOTE — Telephone Encounter (Signed)
Lantus d/c due to insurance change to BASAGLAR 18 units daily

## 2021-05-07 NOTE — Telephone Encounter (Signed)
Please address

## 2021-05-07 NOTE — Telephone Encounter (Signed)
Please all Rx requests and pharmacy comments. Have routed several times

## 2021-05-08 NOTE — Telephone Encounter (Signed)
Pt called and the RX for Continuous Blood Gluc Sensor (FREESTYLE LIBRE 2 SENSOR)is not covered and pt needs Dexcom kit and sensor   Pt had to pay out of pocket for the Accu Chek Guide strip / they need to be a certain way as 725 strips for 90 days to be covered by her insurance / please advise asap   [id number] [place] [address]Phone:  [phone] Fax:  [phone]

## 2021-05-10 ENCOUNTER — Encounter: Payer: Self-pay | Admitting: Primary Care

## 2021-05-10 LAB — HM DIABETES EYE EXAM

## 2021-05-11 ENCOUNTER — Other Ambulatory Visit: Payer: Self-pay

## 2021-05-11 NOTE — Telephone Encounter (Signed)
Sent to PCP ?

## 2021-05-13 ENCOUNTER — Ambulatory Visit: Payer: BC Managed Care – PPO | Admitting: Cardiology

## 2021-05-13 ENCOUNTER — Other Ambulatory Visit (INDEPENDENT_AMBULATORY_CARE_PROVIDER_SITE_OTHER): Payer: Self-pay | Admitting: Primary Care

## 2021-05-13 MED ORDER — DEXCOM G5 RECEIVER KIT DEVI: 1.0000 | Freq: Three times a day (TID) | 0 refills | Status: DC

## 2021-05-13 NOTE — Telephone Encounter (Signed)
Spoke with patient order sent for kit /strips per her request. Did inform her non reimbursable on my end

## 2021-05-13 NOTE — Progress Notes (Deleted)
Patient referred by Kerin Perna, NP for tachycardia  Subjective:   Gwendolyn Carrillo, female    DOB: Feb 05, 1976, 46 y.o.   MRN: [id number]  *** No chief complaint on file.   *** HPI  46 y.o. AA female with history of hypertension, type 2 diabetes mellitus, and anxiety.   Patient was evaluated in [facility] 03/31/2021 when she presented with heart racing symptoms ongoing for approximately 2 hours.  At that time initial EKG revealed SVT at a rate of approximately 200 bpm, she was therefore given adenosine 6 mg x 1 and subsequently converted to sinus tachycardia at a rate of 100-110 bpm.  Patient's potassium and magnesium were repleted in the ED and she was discharged with Lopressor 25 mg twice daily.   *** Past Medical History:  Diagnosis Date   Anxiety    Diabetes mellitus without complication ([place])    Hypertension     *** Past Surgical History:  Procedure Laterality Date   APPENDECTOMY     CESAREAN SECTION     ENDOMETRIAL ABLATION      *** Social History   Tobacco Use  Smoking Status Some Days   Types: Cigarettes  Smokeless Tobacco Never  Tobacco Comments   "I smoke when I'm drinking"    Social History   Substance and Sexual Activity  Alcohol Use Yes   Comment: occ    *** No family history on file.  *** Current Outpatient Medications on File Prior to Visit  Medication Sig Dispense Refill   LORazepam (ATIVAN) 0.5 MG tablet TAKE 1 TABLET BY MOUTH ONCE DAILY AS NEEDED FOR ANXIETY 15 tablet 0   ALPRAZolam (XANAX) 0.5 MG tablet Take 1 tablet (0.5 mg total) by mouth 2 (two) times daily as needed for anxiety. 28 tablet 0   atorvastatin (LIPITOR) 80 MG tablet Take 1 tablet (80 mg total) by mouth daily. 90 tablet 3   busPIRone (BUSPAR) 10 MG tablet Take two tablets three times daily for anxiety. 180 tablet 2   cephALEXin (KEFLEX) 250 MG capsule Take 1 capsule (250 mg total) by mouth 4 (four) times daily. 20 capsule 0   Cholecalciferol (VITAMIN D3) 50  MCG (2000 UT) capsule Take 1 capsule (2,000 Units total) by mouth daily. 90 capsule 1   Continuous Blood Gluc Receiver (FREESTYLE LIBRE 2 READER) DEVI Use to check blood sugar TID. 1 each 2   Continuous Blood Gluc Sensor (FREESTYLE LIBRE 2 SENSOR) MISC Use to check blood sugar TID. 1 each 2   glucose blood (ACCU-CHEK GUIDE) test strip Check blood sugars 3 times a day before meals 100 strip 3   hydrochlorothiazide (HYDRODIURIL) 25 MG tablet Take 1 tablet (25 mg total) by mouth daily. 90 tablet 1   hydrOXYzine (VISTARIL) 50 MG capsule Take 1 capsule (50 mg total) by mouth 3 (three) times daily as needed. 30 capsule 2   insulin aspart (NOVOLOG) 100 UNIT/ML injection Inject MAX OF 12 Units into the skin 3 (three) times daily before meals. For blood sugars 0-150 give 0 units of insulin, 201-250 give 4 units, 251-300 give 6 units, 301-350 give 8 units, 351-400 give 10 units,> 400 give 12 units and call M.D. Discussed hypoglycemia protocol. 10 mL PRN   Insulin Glargine (BASAGLAR KWIKPEN) 100 UNIT/ML Inject 18 Units into the skin daily. 3 mL 3   Insulin Pen Needle (PEN NEEDLES) 32G X 5 MM MISC Use to inject Lantus (insulin) once daily. 100 each 1   LINZESS 145 MCG CAPS  capsule Take 145 mcg by mouth daily as needed (constipation).     lisinopril (ZESTRIL) 20 MG tablet Take 1 tablet (20 mg total) by mouth daily. 90 tablet 1   metoCLOPramide (REGLAN) 10 MG tablet Take 1 tablet (10 mg total) by mouth every 6 (six) hours. 30 tablet 0   metoprolol tartrate (LOPRESSOR) 25 MG tablet Take 0.5 tablets (12.5 mg total) by mouth daily for 14 days. 7 tablet 0   metroNIDAZOLE (FLAGYL) 500 MG tablet Take 1 tablet (500 mg total) by mouth 2 (two) times daily. 14 tablet 0   ondansetron (ZOFRAN ODT) 4 MG disintegrating tablet Take 1 tablet (4 mg total) by mouth every 8 (eight) hours as needed for nausea or vomiting. 10 tablet 0   ondansetron (ZOFRAN) 4 MG tablet Take 1 tablet (4 mg total) by mouth every 8 (eight) hours as  needed for nausea or vomiting. 4 tablet 0   pantoprazole (PROTONIX) 40 MG tablet Take 1 tablet (40 mg total) by mouth daily. 90 tablet 0   potassium chloride SA (KLOR-CON M) 20 MEQ tablet Take 1 tablet (20 mEq total) by mouth daily. 3 tablet 0   No current facility-administered medications on file prior to visit.    Cardiovascular and other pertinent studies:  *** EKG ***/***/202***: ***  No results found for this or any previous visit from the past 1095 days.    *** Recent labs: 04/01/2022: HbA1C 11.9% Chol 225, TG 235, HDL 28, LDL 153  03/31/2022: Glucose 421, BUN/Cr 21/0.89. EGFR >60. Na/K 132/3.0. H/H 14.8/42.3. MCV 88.5. Platelets 291  08/22/2020: 1.937 TSH normal   *** ROS      *** There were no vitals filed for this visit.   There is no height or weight on file to calculate BMI. There were no vitals filed for this visit.  *** Objective:   Physical Exam    ***     Assessment & Recommendations:   ***  ***  Thank you for referring the patient to [place]. Please feel free to contact with any questions.   Nigel Mormon, MD Pager: [phone] Office: [phone]

## 2021-05-15 ENCOUNTER — Telehealth (INDEPENDENT_AMBULATORY_CARE_PROVIDER_SITE_OTHER): Payer: Self-pay | Admitting: Primary Care

## 2021-05-15 NOTE — Telephone Encounter (Signed)
Please see pharmacy.

## 2021-05-18 ENCOUNTER — Other Ambulatory Visit: Payer: Self-pay

## 2021-05-18 ENCOUNTER — Encounter: Payer: Self-pay | Admitting: Adult Health

## 2021-05-18 ENCOUNTER — Ambulatory Visit (INDEPENDENT_AMBULATORY_CARE_PROVIDER_SITE_OTHER): Payer: BC Managed Care – PPO | Admitting: Adult Health

## 2021-05-18 DIAGNOSIS — F331 Major depressive disorder, recurrent, moderate: Secondary | ICD-10-CM | POA: Diagnosis not present

## 2021-05-18 DIAGNOSIS — F411 Generalized anxiety disorder: Secondary | ICD-10-CM

## 2021-05-18 DIAGNOSIS — F410 Panic disorder [episodic paroxysmal anxiety] without agoraphobia: Secondary | ICD-10-CM

## 2021-05-18 DIAGNOSIS — G4700 Insomnia, unspecified: Secondary | ICD-10-CM

## 2021-05-18 DIAGNOSIS — F422 Mixed obsessional thoughts and acts: Secondary | ICD-10-CM

## 2021-05-18 MED ORDER — ALPRAZOLAM 0.5 MG PO TABS: 0.5000 mg | ORAL_TABLET | Freq: Two times a day (BID) | ORAL | 2 refills | Status: DC | PRN

## 2021-05-18 MED ORDER — HYDROXYZINE PAMOATE 50 MG PO CAPS: 50.0000 mg | ORAL_CAPSULE | Freq: Three times a day (TID) | ORAL | 2 refills | Status: DC | PRN

## 2021-05-18 MED ORDER — BUSPIRONE HCL 10 MG PO TABS: ORAL_TABLET | ORAL | 2 refills | Status: DC

## 2021-05-18 NOTE — Progress Notes (Signed)
GAIGE FUSSNER [id number] October 21, 1975 46 y.o.  Subjective:   Patient ID:  Gwendolyn Carrillo is a 46 y.o. (DOB August 05, 1975) female.  Chief Complaint: No chief complaint on file.   HPI Gwendolyn Carrillo presents to the office today for follow-up of MDD, mixed obsessional thoughts, insomnia, panic attacks, and GAD.   Describes mood today as "ok". Decreased tearfulness. Pleasant. Mood symptoms - denies depression and irritability. Decreased anxiety - "it's more controlled". Mind no longer racing. Decreased irrational thinking. Reports panic attacks around menstrual cycle. Stating "my fear is better controlled with the Xanax". Has a follow with cardiology next week. Reports stopping both ETOH and THC x 3 months ago. Decreased interest and motivation. Taking medications as prescribed. Seeing therapist.  Energy levels stable. Active, does not have a regular exercise routine.  Able to enjoy usual interests and activities. Single. Dating. Has 4 children 25, 19, 23, and 15. Family local. Spending time with family. Appetite improved. Weight  loss - 178 pounds.  Sleep has improved. Averages 8 hours. Focus and concentration difficulties.Completing tasks. Managing some aspects of household. Work [employer] - going well. Denies SI or HI.  Denies AH or VH.   [place] Office Visit from 04/01/2021 in [place] Office Visit from 12/30/2020 in [place] Office Visit from 09/29/2020 in [place]  Total GAD-7 Score _0 PHQ2-9    [place] Office Visit from 04/01/2021 in [place] Office Visit from 12/30/2020 in [place] Office Visit from 09/29/2020 in [place] ED from 08/20/2020 in [facility]  PHQ-2 Total Score _1 PHQ-9 Total Score _2 Flowsheet Row ED from 03/31/2021 in [facility] ED from 03/24/2021 in [facility] ED from 02/18/2021 in [facility]  C-SSRS RISK CATEGORY No Risk No Risk No Risk        Review of Systems:  Review of Systems  Musculoskeletal:  Negative for gait problem.  Neurological:  Negative for tremors.  Psychiatric/Behavioral:         Please refer to HPI   Medications: I have reviewed the patient's current medications.  Current Outpatient Medications  Medication Sig Dispense Refill   ALPRAZolam (XANAX) 0.5 MG tablet Take 1 tablet (0.5 mg total) by mouth 2 (two) times daily as needed for anxiety. 60 tablet 2   atorvastatin (LIPITOR) 80 MG tablet Take 1 tablet (80 mg total) by mouth daily. 90 tablet 3   busPIRone (BUSPAR) 10 MG tablet Take two tablets three times daily for anxiety. 180 tablet 2   cephALEXin (KEFLEX) 250 MG capsule Take 1 capsule (250 mg total) by mouth 4 (four) times daily. 20 capsule 0   Cholecalciferol (VITAMIN D3) 50 MCG (2000 UT) capsule Take 1 capsule (2,000 Units total) by mouth daily. 90 capsule 1   Continuous Blood Gluc Receiver (DEXCOM G5 RECEIVER KIT) DEVI 1 kit by Does not apply route 3 (three) times daily before meals. 1 each 0   Continuous Blood Gluc Sensor (FREESTYLE LIBRE 2 SENSOR) MISC Use to check blood sugar TID. 1 each 2   glucose blood (ACCU-CHEK GUIDE) test strip Check blood sugars 3 times a day before meals 100 strip 3   hydrochlorothiazide (HYDRODIURIL) 25 MG tablet Take 1 tablet (25  mg total) by mouth daily. 90 tablet 1   hydrOXYzine (VISTARIL) 50 MG capsule Take 1 capsule (50 mg total) by mouth 3 (three) times daily as needed. 30 capsule 2   insulin aspart (NOVOLOG) 100 UNIT/ML injection Inject MAX OF 12 Units into the skin 3 (three) times daily before meals. For blood sugars 0-150 give 0 units of insulin, 201-250 give 4 units, 251-300 give 6 units, 301-350 give 8 units, 351-400 give 10 units,> 400 give 12 units and call  M.D. Discussed hypoglycemia protocol. 10 mL PRN   Insulin Glargine (BASAGLAR KWIKPEN) 100 UNIT/ML Inject 18 Units into the skin daily. 3 mL 3   Insulin Pen Needle (PEN NEEDLES) 32G X 5 MM MISC Use to inject Lantus (insulin) once daily. 100 each 1   LINZESS 145 MCG CAPS capsule Take 145 mcg by mouth daily as needed (constipation).     lisinopril (ZESTRIL) 20 MG tablet Take 1 tablet (20 mg total) by mouth daily. 90 tablet 1   metoCLOPramide (REGLAN) 10 MG tablet Take 1 tablet (10 mg total) by mouth every 6 (six) hours. 30 tablet 0   metoprolol tartrate (LOPRESSOR) 25 MG tablet Take 0.5 tablets (12.5 mg total) by mouth daily for 14 days. 7 tablet 0   metroNIDAZOLE (FLAGYL) 500 MG tablet Take 1 tablet (500 mg total) by mouth 2 (two) times daily. 14 tablet 0   ondansetron (ZOFRAN ODT) 4 MG disintegrating tablet Take 1 tablet (4 mg total) by mouth every 8 (eight) hours as needed for nausea or vomiting. 10 tablet 0   ondansetron (ZOFRAN) 4 MG tablet Take 1 tablet (4 mg total) by mouth every 8 (eight) hours as needed for nausea or vomiting. 4 tablet 0   pantoprazole (PROTONIX) 40 MG tablet Take 1 tablet (40 mg total) by mouth daily. 90 tablet 0   potassium chloride SA (KLOR-CON M) 20 MEQ tablet Take 1 tablet (20 mEq total) by mouth daily. 3 tablet 0   No current facility-administered medications for this visit.    Medication Side Effects: None  Allergies: No Known Allergies  Past Medical History:  Diagnosis Date   Anxiety    Diabetes mellitus without complication ([place])    Hypertension     Past Medical History, Surgical history, Social history, and Family history were reviewed and updated as appropriate.   Please see review of systems for further details on the patient's review from today.   Objective:   Physical Exam:  There were no vitals taken for this visit.  Physical Exam Constitutional:      General: Gwendolyn Carrillo is not in acute distress. Musculoskeletal:        General: No deformity.   Neurological:     Mental Status: Gwendolyn Carrillo is alert and oriented to person, place, and time.     Cranial Nerves: No dysarthria.     Coordination: Coordination normal.  Psychiatric:        Attention and Perception: Attention and perception normal. Gwendolyn Carrillo does not perceive auditory or visual hallucinations.        Mood and Affect: Mood normal. Mood is not anxious or depressed. Affect is not labile, blunt, angry or inappropriate.        Speech: Speech normal.        Behavior: Behavior normal. Behavior is cooperative.        Thought Content: Thought content normal. Thought content is not paranoid or delusional. Thought content does not include homicidal or suicidal ideation. Thought content does not include homicidal or  suicidal plan.        Cognition and Memory: Cognition and memory normal.        Judgment: Judgment normal.     Comments: Insight intact    Lab Review:     Component Value Date/Time   NA 133 (L) 03/31/2021 1125   K 3.6 03/31/2021 1125   CL 93 (L) 03/31/2021 0940   CO2 22 03/31/2021 0940   GLUCOSE 421 (H) 03/31/2021 0940   BUN 21 (H) 03/31/2021 0940   CREATININE 0.89 03/31/2021 0940   CALCIUM 9.9 03/31/2021 0940   PROT 8.5 (H) 02/18/2021 0831   ALBUMIN 4.5 02/18/2021 0831   AST 28 02/18/2021 0831   ALT 42 02/18/2021 0831   ALKPHOS 78 02/18/2021 0831   BILITOT 0.8 02/18/2021 0831   GFRNONAA >60 03/31/2021 0940   GFRAA >60 10/10/2019 1441       Component Value Date/Time   WBC 15.3 (H) 03/31/2021 0940   RBC 4.78 03/31/2021 0940   HGB 15.0 03/31/2021 1125   HCT 44.0 03/31/2021 1125   PLT 291 03/31/2021 0940   MCV 88.5 03/31/2021 0940   MCH 31.0 03/31/2021 0940   MCHC 35.0 03/31/2021 0940   RDW 12.2 03/31/2021 0940   LYMPHSABS 1.6 03/31/2021 0940   MONOABS 1.0 03/31/2021 0940   EOSABS 0.1 03/31/2021 0940   BASOSABS 0.0 03/31/2021 0940    No results found for: POCLITH, LITHIUM   No results found for: PHENYTOIN, PHENOBARB, VALPROATE, CBMZ   .res Assessment: Plan:     Plan:  PDMP reviewed  Buspar 67m TID  Hydroxyzine 5374mat hs Xanax 0.74m62mID for severe anxiety  Continue therapy - Raven WilMaricela Botherapist.  RTC 4 weeks  Patient weeks advised to contact office with any questions, adverse effects, or acute worsening in signs and symptoms.  Discussed potential benefits, risk, and side effects of benzodiazepines to include potential risk of tolerance and dependence, as well as possible drowsiness. Advised patient not to drive if experiencing drowsiness and to take lowest possible effective dose to minimize risk of dependence and tolerance.  Diagnoses and all orders for this visit:  Insomnia, unspecified type  Panic attacks -     ALPRAZolam (XANAX) 0.5 MG tablet; Take 1 tablet (0.5 mg total) by mouth 2 (two) times daily as needed for anxiety. -     busPIRone (BUSPAR) 10 MG tablet; Take two tablets three times daily for anxiety.  Generalized anxiety disorder -     hydrOXYzine (VISTARIL) 50 MG capsule; Take 1 capsule (50 mg total) by mouth 3 (three) times daily as needed. -     busPIRone (BUSPAR) 10 MG tablet; Take two tablets three times daily for anxiety.  Major depressive disorder, recurrent episode, moderate (HCC)  Mixed obsessional thoughts and acts     Please see After Visit Summary for patient specific instructions.  Future Appointments  Date Time Provider DepDrew/13/2023  1:30 PM PatNigel MormonD PCV-PCV None  07/01/2021  9:10 AM [facility]in [facility]P [facility]    No orders of the defined types were placed in this encounter.   -------------------------------

## 2021-05-22 ENCOUNTER — Telehealth (INDEPENDENT_AMBULATORY_CARE_PROVIDER_SITE_OTHER): Payer: Self-pay | Admitting: Primary Care

## 2021-05-22 ENCOUNTER — Other Ambulatory Visit (INDEPENDENT_AMBULATORY_CARE_PROVIDER_SITE_OTHER): Payer: Self-pay | Admitting: Primary Care

## 2021-05-22 DIAGNOSIS — E119 Type 2 diabetes mellitus without complications: Secondary | ICD-10-CM

## 2021-05-22 MED ORDER — DEXCOM G6 RECEIVER DEVI: 1.0000 | Freq: Three times a day (TID) | 0 refills | Status: AC

## 2021-05-22 MED ORDER — INSULIN ASPART 100 UNIT/ML IJ SOLN: 12.0000 [IU] | Freq: Three times a day (TID) | INTRAMUSCULAR | 10 refills | Status: DC

## 2021-05-22 MED ORDER — INSULIN REGULAR HUMAN 100 UNIT/ML IJ SOLN: INTRAMUSCULAR | 11 refills | Status: DC

## 2021-05-22 NOTE — Telephone Encounter (Signed)
Medication Refill - Medication: insulin aspart (NOVOLOG) 100 UNIT/ML injection  If this comes in a pen needle , pt would prefer. If not, pt will need a box of syringes.  Preferred Pharmacy (with phone number or street name): [id number] - [place], [place] - [address]  Has the patient been seen for an appointment in the last year OR does the patient have an upcoming appointment? Yes.    Agent: Please be advised that RX refills may take up to 3 business days. We ask that you follow-up with your pharmacy.

## 2021-05-22 NOTE — Telephone Encounter (Signed)
Routed to PCP 

## 2021-05-22 NOTE — Telephone Encounter (Signed)
Sent to PCP to refill  

## 2021-05-22 NOTE — Telephone Encounter (Signed)
Pt following up on request from the pharmacy.  Dexciom 5 was sent, but they do not carry this model. They do have the Dexcom G6. Please send new prescription for this model.  [id number] - [place],  - [address]

## 2021-05-26 ENCOUNTER — Other Ambulatory Visit (INDEPENDENT_AMBULATORY_CARE_PROVIDER_SITE_OTHER): Payer: Self-pay | Admitting: Primary Care

## 2021-05-26 MED ORDER — DEXCOM G6 SENSOR MISC: 1.0000 "application " | 3 refills | Status: AC

## 2021-05-26 MED ORDER — NOVOLOG FLEXPEN 100 UNIT/ML ~~LOC~~ SOPN: 12.0000 [IU] | PEN_INJECTOR | Freq: Three times a day (TID) | SUBCUTANEOUS | 11 refills | Status: DC

## 2021-05-26 MED ORDER — DEXCOM G6 TRANSMITTER MISC: 1.0000 "application " | 3 refills | Status: DC

## 2021-06-03 NOTE — Progress Notes (Signed)
Patient referred by Kerin Perna, NP for tachycardia  Subjective:   Gwendolyn Carrillo, female    DOB: 04-Dec-1975, 46 y.o.   MRN: [id number]   Chief Complaint  Patient presents with   Tachycardia   New Patient (Initial Visit)     HPI  46 year old African-American female with hypertension, uncontrolled insulin-dependent diabetes mellitus, prior h/o alcohol use, depression, generalized anxiety disorder, panic attacks, referred for evaluation of tachycardia  Patient works at [employer], has a sedentary job.  For last several years, she has had episodes of sudden onset heart racing, palpitations.  She reports that her heart rate goes up to as high as 200 bpm during these episodes.  Episodes sometimes improved with deep breathing.  Recently, she has had to go to emergency room.  EKG in the emergency room showed sinus tachycardia, but no other arrhythmia seen.  Patient reports these episodes temporally correlating with her menstrual cycles, and associated with significant anxiety.  Patient tells me that previously, she has seen a cardiologist who had recommended ablation (in 2010).  Separately, patient reports exertional dyspnea with walking up stairs at work.  She denies any chest pain.  Patient has had diabetes since 2017.  This was well controlled on Ozempic.  However, Ozempic had to be stopped due to severe gastroparesis and GI side effects.  As a result, she gained back 60 pounds and lost control of her diabetes.  She is now on insulin with improvement in her blood sugars.  Patient used to drink 2-3 alcohol shots every day, but quit drinking alcohol completely few months ago.  She drinks 1 to 2 cups of coffee every day.  She still smokes about 5 cigarettes a day.   Past Medical History:  Diagnosis Date   Anxiety    Diabetes mellitus without complication ([place])    Hypertension      Past Surgical History:  Procedure Laterality Date   APPENDECTOMY     CESAREAN SECTION      ENDOMETRIAL ABLATION       Social History   Tobacco Use  Smoking Status Some Days   Types: Cigarettes  Smokeless Tobacco Never  Tobacco Comments   "I smoke when I'm drinking"    Social History   Substance and Sexual Activity  Alcohol Use Yes   Comment: occ     Family History  Problem Relation Age of Onset   Hyperlipidemia Mother    Hypertension Mother    Hyperlipidemia Father    Hypertension Father    Diabetes Father    Diabetes Sister    Blindness Sister    Hypertension Brother    Diabetes Brother    Stroke Brother    Heart disease Maternal Grandmother    Heart disease Paternal Grandmother      Current Outpatient Medications on File Prior to Visit  Medication Sig Dispense Refill   ALPRAZolam (XANAX) 0.5 MG tablet Take 1 tablet (0.5 mg total) by mouth 2 (two) times daily as needed for anxiety. 60 tablet 2   atorvastatin (LIPITOR) 80 MG tablet Take 1 tablet (80 mg total) by mouth daily. 90 tablet 3   busPIRone (BUSPAR) 10 MG tablet Take two tablets three times daily for anxiety. 180 tablet 2   Cholecalciferol (VITAMIN D3) 50 MCG (2000 UT) capsule Take 1 capsule (2,000 Units total) by mouth daily. 90 capsule 1   Continuous Blood Gluc Receiver (DEXCOM G6 RECEIVER) DEVI 1 Bag by Does not apply route in the morning, at noon,  and at bedtime. 1 each 0   Continuous Blood Gluc Transmit (DEXCOM G6 TRANSMITTER) MISC 1 application by Does not apply route as directed. 1 each 3   hydrochlorothiazide (HYDRODIURIL) 25 MG tablet Take 1 tablet (25 mg total) by mouth daily. 90 tablet 1   hydrOXYzine (VISTARIL) 50 MG capsule Take 1 capsule (50 mg total) by mouth 3 (three) times daily as needed. 30 capsule 2   insulin aspart (NOVOLOG FLEXPEN) 100 UNIT/ML FlexPen Inject 12 Units into the skin 3 (three) times daily with meals. Sig: blood sugars 200 -250 give 4 units of insulin, 251-300 give 6 units, 301-350 give 8 units, 351-400 give 10 units,> 400 give 12 units and call M.D. Discussed  hypoglycemia protocol. 15 mL 11   Insulin Glargine (BASAGLAR KWIKPEN) 100 UNIT/ML Inject 18 Units into the skin daily. 3 mL 3   Insulin Pen Needle (PEN NEEDLES) 32G X 5 MM MISC Use to inject Lantus (insulin) once daily. 100 each 1   LINZESS 145 MCG CAPS capsule Take 145 mcg by mouth daily as needed (constipation).     lisinopril (ZESTRIL) 20 MG tablet Take 1 tablet (20 mg total) by mouth daily. 90 tablet 1   pantoprazole (PROTONIX) 40 MG tablet Take 1 tablet (40 mg total) by mouth daily. 90 tablet 0   Continuous Blood Gluc Sensor (FREESTYLE LIBRE 2 SENSOR) MISC Use to check blood sugar TID. 1 each 2   glucose blood (ACCU-CHEK GUIDE) test strip Check blood sugars 3 times a day before meals 100 strip 3   No current facility-administered medications on file prior to visit.    Cardiovascular and other pertinent studies:  EKG 06/08/2021: Sinus rhythm 93 bpm Normal EKG  EKG 03/31/2021:   1. Sinus tachycardia 2. Consider right atrial enlargement 3. Borderline ST depression, diffuse leads 4. Abnormal T, consider ischemia, diffuse leads 5. Baseline wander in lead(s) II   Recent labs: 03/31/2021: Glucose 421, BUN/Cr 21/0.89. EGFR >60. Na/K 133/3.6.  Chloride: 9.3, Anion gap: 17, Magnesium: 1.4, Lipase: 69, Total Protein: 8.5 H/H 15.0/44.0. MCV 88.5. Platelets 291 HbA1C 11.9% Chol 225, TG 235, HDL 28, LDL 128 TSH 1.9 normal (07/2020)    Review of Systems  Cardiovascular:  Positive for dyspnea on exertion and palpitations. Negative for chest pain, leg swelling and syncope.        Vitals:   06/08/21 1306  BP: 126/81  Pulse: (!) 105  Resp: 16  Temp: 98 F (36.7 C)  SpO2: 96%     Body mass index is 33.13 kg/m. Filed Weights   06/08/21 1306  Weight: 193 lb (87.5 kg)     Objective:   Physical Exam Vitals and nursing note reviewed.  Constitutional:      General: She is not in acute distress. Neck:     Vascular: No JVD.  Cardiovascular:     Rate and Rhythm: Normal rate  and regular rhythm.     Heart sounds: Normal heart sounds. No murmur heard. Pulmonary:     Effort: Pulmonary effort is normal.     Breath sounds: Normal breath sounds. No wheezing or rales.  Musculoskeletal:     Right lower leg: No edema.     Left lower leg: No edema.        Assessment & Recommendations:   46 year old African-American female with hypertension, uncontrolled insulin-dependent diabetes mellitus, prior h/o alcohol use, depression, generalized anxiety disorder, panic attacks, referred for evaluation of tachycardia  Tachycardia/palpitations: Need to evaluate for tachyarrhythmias.  EKG in emergency  room only showed sinus tachycardia.  Recommend 2-week cardiac telemetry.  Started metoprolol tartrate 25 mg twice daily.  Discussed vagal maneuvers.  Exertional dyspnea: Several risk factors for CAD, including type 2 diabetes mellitus, tobacco abuse, family history of early CAD.  Recommend echocardiogram, exercise nuclear stress test, and calcium scoring.  Nicotine dependence: Tobacco cessation counseling:  - Currently smoking 1/4 packs/day   - Patient was informed of the dangers of tobacco abuse including stroke, cancer, and MI, as well as benefits of tobacco cessation. - Patient is willing to quit at this time. - Approximately 5 mins were spent counseling patient cessation techniques. We discussed various methods to help quit smoking, including deciding on a date to quit, joining a support group, pharmacological agents. Patient would like to use nicotine gum. - I will reassess her progress at the next follow-up visit   Thank you for referring the patient to [place]. Please feel free to contact with any questions.   Nigel Mormon, MD Pager: [phone] Office: [phone]

## 2021-06-08 ENCOUNTER — Inpatient Hospital Stay: Payer: BC Managed Care – PPO

## 2021-06-08 ENCOUNTER — Ambulatory Visit: Payer: BC Managed Care – PPO | Admitting: Cardiology

## 2021-06-08 ENCOUNTER — Other Ambulatory Visit: Payer: Self-pay

## 2021-06-08 ENCOUNTER — Encounter: Payer: Self-pay | Admitting: Cardiology

## 2021-06-08 VITALS — BP 126/81 | HR 105 | Temp 98.0°F | Resp 16 | Ht 64.0 in | Wt 193.0 lb

## 2021-06-08 DIAGNOSIS — F17210 Nicotine dependence, cigarettes, uncomplicated: Secondary | ICD-10-CM

## 2021-06-08 DIAGNOSIS — R000 Tachycardia, unspecified: Secondary | ICD-10-CM | POA: Insufficient documentation

## 2021-06-08 DIAGNOSIS — R0609 Other forms of dyspnea: Secondary | ICD-10-CM | POA: Insufficient documentation

## 2021-06-08 DIAGNOSIS — R002 Palpitations: Secondary | ICD-10-CM

## 2021-06-08 DIAGNOSIS — I10 Essential (primary) hypertension: Secondary | ICD-10-CM

## 2021-06-08 MED ORDER — METOPROLOL TARTRATE 25 MG PO TABS: 25.0000 mg | ORAL_TABLET | Freq: Two times a day (BID) | ORAL | 3 refills | Status: DC

## 2021-06-09 ENCOUNTER — Other Ambulatory Visit (INDEPENDENT_AMBULATORY_CARE_PROVIDER_SITE_OTHER): Payer: Self-pay | Admitting: Primary Care

## 2021-06-09 DIAGNOSIS — K219 Gastro-esophageal reflux disease without esophagitis: Secondary | ICD-10-CM

## 2021-06-09 NOTE — Telephone Encounter (Signed)
Requested Prescriptions  Pending Prescriptions Disp Refills   pantoprazole (PROTONIX) 40 MG tablet [Pharmacy Med Name: PANTOPRAZOLE SOD DR 40 MG TAB] 90 tablet 1    Sig: TAKE 1 TABLET BY MOUTH EVERY DAY     Gastroenterology: Proton Pump Inhibitors Passed - 06/09/2021  8:33 AM      Passed - Valid encounter within last 12 months    Recent Outpatient Visits          2 months ago Type 2 diabetes mellitus without complication, without long-term current use of insulin (HCC)   [facility] Gwinda Passe P, NP   4 months ago Type 2 diabetes mellitus without complication, with long-term current use of insulin (HCC)   [facility] Lois Huxley, Cornelius Moras, RPH-CPP   5 months ago Primary hypertension   [facility] Grayce Sessions, NP   8 months ago Prediabetes   [facility] Grayce Sessions, NP      Future Appointments            In 3 weeks Randa Evens Kinnie Scales, NP [facility]

## 2021-06-22 ENCOUNTER — Other Ambulatory Visit: Payer: Self-pay

## 2021-06-22 ENCOUNTER — Ambulatory Visit: Payer: BC Managed Care – PPO

## 2021-06-22 DIAGNOSIS — R0609 Other forms of dyspnea: Secondary | ICD-10-CM

## 2021-06-24 ENCOUNTER — Other Ambulatory Visit: Payer: Self-pay

## 2021-07-01 ENCOUNTER — Ambulatory Visit (INDEPENDENT_AMBULATORY_CARE_PROVIDER_SITE_OTHER): Payer: Self-pay | Admitting: Primary Care

## 2021-07-07 ENCOUNTER — Other Ambulatory Visit: Payer: Self-pay | Admitting: Adult Health

## 2021-07-07 DIAGNOSIS — F411 Generalized anxiety disorder: Secondary | ICD-10-CM

## 2021-07-07 DIAGNOSIS — F410 Panic disorder [episodic paroxysmal anxiety] without agoraphobia: Secondary | ICD-10-CM

## 2021-07-07 NOTE — Telephone Encounter (Signed)
Please call to schedule an appt. Last seen 12/7 with RTC in 4 weeks.  ?

## 2021-07-15 ENCOUNTER — Other Ambulatory Visit: Payer: BC Managed Care – PPO

## 2021-07-20 ENCOUNTER — Other Ambulatory Visit: Payer: Self-pay

## 2021-07-24 ENCOUNTER — Ambulatory Visit (INDEPENDENT_AMBULATORY_CARE_PROVIDER_SITE_OTHER): Payer: BC Managed Care – PPO | Admitting: Adult Health

## 2021-07-24 ENCOUNTER — Encounter: Payer: Self-pay | Admitting: Adult Health

## 2021-07-24 DIAGNOSIS — G4700 Insomnia, unspecified: Secondary | ICD-10-CM | POA: Diagnosis not present

## 2021-07-24 DIAGNOSIS — F410 Panic disorder [episodic paroxysmal anxiety] without agoraphobia: Secondary | ICD-10-CM

## 2021-07-24 DIAGNOSIS — F331 Major depressive disorder, recurrent, moderate: Secondary | ICD-10-CM

## 2021-07-24 DIAGNOSIS — F422 Mixed obsessional thoughts and acts: Secondary | ICD-10-CM | POA: Diagnosis not present

## 2021-07-24 DIAGNOSIS — F411 Generalized anxiety disorder: Secondary | ICD-10-CM | POA: Diagnosis not present

## 2021-07-24 MED ORDER — ALPRAZOLAM 0.5 MG PO TABS: 0.5000 mg | ORAL_TABLET | Freq: Three times a day (TID) | ORAL | 2 refills | Status: DC | PRN

## 2021-07-24 MED ORDER — FLUOXETINE HCL 20 MG PO CAPS: 20.0000 mg | ORAL_CAPSULE | Freq: Every day | ORAL | 3 refills | Status: DC

## 2021-07-24 NOTE — Progress Notes (Signed)
Gwendolyn Carrillo ?[id number] ?1975-11-07 ?46 y.o. ? ?Subjective:  ? ?Patient ID:  Gwendolyn Echevariaakisha J Metheney is a 46 y.o. (DOB 1975-11-07) female. ? ?Chief Complaint: No chief complaint on file. ? ? ?HPI ?Gwendolyn Echevariaakisha J Doane presents to the office today for follow-up of MDD, mixed obsessional thoughts, insomnia, panic attacks, and GAD.  ? ?Describes mood today as "ok". Decreased tearfulness. Pleasant. Mood symptoms - reports depression and irritability. Increased anxiety. Reports panic attacks. Stating "I've had 3 good days over the past month". Feels like her mood has declined, but unable to identify a precipitant. Does correlate some of her symptoms as well as weight gain around her menstrual cycle. Working with cardiology - no issues identified. Also followed up with GYN regarding hormones. Denies both ETOH and THC x 3 months ago. Decreased interest and motivation. Taking medications as prescribed. Seeing therapist.  ?Energy levels lower. Active, does not have a regular exercise routine.  ?Able to enjoy usual interests and activities. Single. Dating. Has 4 children 25, 19, 23, and 15. Family local. Spending time with family. ?Appetite adequate - denies over eating. Weight gain - 178 pounds to 195 pounds. Diabetes controlled. ?Sleep has declined. Averages 3 hours of solid sleep, and then in and out of sleep until she wakes up. ?Focus and concentration difficulties.Completing tasks. Managing some aspects of household. Work [employer] - going well. ?Denies SI or HI.  ?Denies AH or VH. ? ?GAD-7   ? ?Flowsheet Row [facility]  ?Total GAD-7 Score 16 10 6   ? ?  ? ?PHQ2-9   ? ?Flow[facility]  ?PHQ-2 Total Score 3 4 5 6   ?PHQ-9 Total Score 13 11 12 17   ? ?  ? ?Flowsheet Row ED from 03/31/2021 in [facility] ED from 03/24/2021 in [facility] ED from 02/18/2021 in [facility]  ?C-SSRS RISK CATEGORY No Risk No Risk No Risk  ? ?  ?  ? ?Review of Systems:  ?Review of Systems  ?Musculoskeletal:  Negative for gait problem.  ?Neurological:  Negative for tremors.  ?Psychiatric/Behavioral:    ?     Please refer to HPI  ? ?Medications: I have reviewed the patient's current medications. ? ?Current Outpatient Medications  ?Medication Sig Dispense Refill  ? busPIRone (BUSPAR) 10 MG tablet TAKE TWO TABLETS THREE TIMES DAILY FOR ANXIETY. 180 tablet 0  ? FLUoxetine (PROZAC) 20 MG capsule Take 1 capsule (20 mg total) by mouth daily. 30 capsule 3  ? ALPRAZolam (XANAX) 0.5 MG tablet Take 1 tablet (0.5 mg total) by mouth 3 (three) times daily as needed for anxiety. 60 tablet 2  ? atorvastatin (LIPITOR) 80 MG tablet Take 1 tablet (80 mg total) by mouth daily. 90 tablet 3  ? Cholecalciferol (VITAMIN D3) 50 MCG (2000 UT) capsule Take 1 capsule (2,000 Units total) by mouth daily. 90 capsule 1  ? Continuous Blood Gluc Receiver (DEXCOM G6 RECEIVER) DEVI 1 Bag by Does not apply route in the morning, at noon, and at bedtime. 1 each 0  ? Continuous Blood Gluc Sensor (FREESTYLE LIBRE 2 SENSOR) MISC Use to  check blood sugar TID. 1 each 2  ? Continuous Blood Gluc Transmit (DEXCOM G6 TRANSMITTER) MISC 1 application by Does not apply route as directed. 1 each 3  ? glucose blood (ACCU-CHEK GUIDE) test strip Check blood sugars 3 times a day before meals 100 strip 3  ? hydrochlorothiazide (HYDRODIURIL) 25 MG tablet Take 1 tablet (25 mg total) by mouth daily. 90 tablet 1  ? hydrOXYzine (VISTARIL) 50 MG capsule Take 1 capsule (50 mg total) by mouth 3 (three) times  daily as needed. 30 capsule 2  ? insulin aspart (NOVOLOG FLEXPEN) 100 UNIT/ML FlexPen Inject 12 Units into the skin 3 (three) times daily with meals. Sig: blood sugars 200 -250 give 4 units of insulin, 251-300 give 6 units, 301-350 give 8 units, 351-400 give 10 units,> 400 give 12 units and call M.D. Discussed hypoglycemia protocol. 15 mL 11  ? Insulin Glargine (BASAGLAR KWIKPEN) 100 UNIT/ML Inject 18 Units into the skin daily. 3 mL 3  ? Insulin Pen Needle (PEN NEEDLES) 32G X 5 MM MISC Use to inject Lantus (insulin) once daily. 100 each 1  ? LINZESS 145 MCG CAPS capsule Take 145 mcg by mouth daily as needed (constipation).    ? lisinopril (ZESTRIL) 20 MG tablet Take 1 tablet (20 mg total) by mouth daily. 90 tablet 1  ? metoprolol tartrate (LOPRESSOR) 25 MG tablet Take 1 tablet (25 mg total) by mouth 2 (two) times daily. 60 tablet 3  ? pantoprazole (PROTONIX) 40 MG tablet TAKE 1 TABLET BY MOUTH EVERY DAY 90 tablet 1  ? ?No current facility-administered medications for this visit.  ? ? ?Medication Side Effects: None ? ?Allergies: No Known Allergies ? ?Past Medical History:  ?Diagnosis Date  ? Anxiety   ? Diabetes mellitus without complication (HCC)   ? Hypertension   ? ? ?Past Medical History, Surgical history, Social history, and Family history were reviewed and updated as appropriate.  ? ?Please see review of systems for further details on the patient's review from today.  ? ?Objective:  ? ?Physical Exam:  ?There were no vitals taken for this visit. ? ?Physical Exam ?Constitutional:   ?   General: She is not in acute distress. ?Musculoskeletal:     ?   General: No deformity.  ?Neurological:  ?   Mental Status: She is alert and oriented to person, place, and time.  ?   Coordination: Coordination normal.  ?Psychiatric:     ?   Attention and Perception: Attention and perception normal. She does not perceive auditory or visual hallucinations.     ?   Mood and Affect: Mood normal. Mood is not anxious or depressed. Affect  is not labile, blunt, angry or inappropriate.     ?   Speech: Speech normal.     ?   Behavior: Behavior normal.     ?   Thought Content: Thought content normal. Thought content is not paranoid or delusional. Thought content does not include homicidal or suicidal ideation. Thought content does not include homicidal or suicidal plan.     ?   Cognition and Memory: Cognition and memory normal.     ?   Judgment: Judgment normal.  ?   Comments: Insight intact  ? ? ?Lab Review:  ?   ?Component Value Date/Time  ? NA 133 (L) 03/31/2021 1125  ? K 3.6 03/31/2021 1125  ? CL 93 (L) 03/31/2021 0940  ? CO2 22 03/31/2021 0940  ? GLUCOSE 421 (H) 03/31/2021 0940  ? BUN  21 (H) 03/31/2021 0940  ? CREATININE 0.89 03/31/2021 0940  ? CALCIUM 9.9 03/31/2021 0940  ? PROT 8.5 (H) 02/18/2021 0831  ? ALBUMIN 4.5 02/18/2021 0831  ? AST 28 02/18/2021 0831  ? ALT 42 02/18/2021 0831  ? ALKPHOS 78 02/18/2021 0831  ? BILITOT 0.8 02/18/2021 0831  ? GFRNONAA >60 03/31/2021 0940  ? GFRAA >60 10/10/2019 1441  ? ? ?   ?Component Value Date/Time  ? WBC 15.3 (H) 03/31/2021 0940  ? RBC 4.78 03/31/2021 0940  ? HGB 15.0 03/31/2021 1125  ? HCT 44.0 03/31/2021 1125  ? PLT 291 03/31/2021 0940  ? MCV 88.5 03/31/2021 0940  ? MCH 31.0 03/31/2021 0940  ? MCHC 35.0 03/31/2021 0940  ? RDW 12.2 03/31/2021 0940  ? LYMPHSABS 1.6 03/31/2021 0940  ? MONOABS 1.0 03/31/2021 0940  ? EOSABS 0.1 03/31/2021 0940  ? BASOSABS 0.0 03/31/2021 0940  ? ? ?No results found for: POCLITH, LITHIUM  ? ?No results found for: PHENYTOIN, PHENOBARB, VALPROATE, CBMZ  ? ?.res ?Assessment: Plan:   ? ?Plan: ? ?PDMP reviewed ? ?Prozac 20mg  daily ?Buspar 20mg  TID  ?Hydroxyzine 50mg  at hs ?Xanax 0.5mg  BID to TID for severe anxiety ?Add Prozac 20mg  daily ? ?Continue therapy - [facility] - therapist. ? ?RTC 4 weeks ? ?Patient weeks advised to contact office with any questions, adverse effects, or acute worsening in signs and symptoms. ? ?Discussed potential benefits, risk, and side effects of  benzodiazepines to include potential risk of tolerance and dependence, as well as possible drowsiness. Advised patient not to drive if experiencing drowsiness and to take lowest possible effective dose to

## 2021-08-04 ENCOUNTER — Other Ambulatory Visit: Payer: Self-pay | Admitting: Adult Health

## 2021-08-04 DIAGNOSIS — F410 Panic disorder [episodic paroxysmal anxiety] without agoraphobia: Secondary | ICD-10-CM

## 2021-08-04 DIAGNOSIS — F411 Generalized anxiety disorder: Secondary | ICD-10-CM

## 2021-08-12 ENCOUNTER — Ambulatory Visit (INDEPENDENT_AMBULATORY_CARE_PROVIDER_SITE_OTHER): Payer: BC Managed Care – PPO | Admitting: Primary Care

## 2021-08-12 ENCOUNTER — Encounter (INDEPENDENT_AMBULATORY_CARE_PROVIDER_SITE_OTHER): Payer: Self-pay | Admitting: Primary Care

## 2021-08-12 VITALS — BP 123/87 | HR 94 | Temp 97.3°F | Ht 64.0 in | Wt 193.2 lb

## 2021-08-12 DIAGNOSIS — E782 Mixed hyperlipidemia: Secondary | ICD-10-CM

## 2021-08-12 DIAGNOSIS — E119 Type 2 diabetes mellitus without complications: Secondary | ICD-10-CM

## 2021-08-12 DIAGNOSIS — R5383 Other fatigue: Secondary | ICD-10-CM | POA: Diagnosis not present

## 2021-08-12 DIAGNOSIS — F32A Depression, unspecified: Secondary | ICD-10-CM

## 2021-08-12 DIAGNOSIS — I10 Essential (primary) hypertension: Secondary | ICD-10-CM

## 2021-08-12 LAB — POCT GLYCOSYLATED HEMOGLOBIN (HGB A1C): Hemoglobin A1C: 7.2 % — AB (ref 4.0–5.6)

## 2021-08-12 MED ORDER — BASAGLAR KWIKPEN 100 UNIT/ML ~~LOC~~ SOPN: 18.0000 [IU] | PEN_INJECTOR | Freq: Every day | SUBCUTANEOUS | 3 refills | Status: DC

## 2021-08-12 MED ORDER — HYDROCHLOROTHIAZIDE 25 MG PO TABS: 25.0000 mg | ORAL_TABLET | Freq: Every day | ORAL | 1 refills | Status: DC

## 2021-08-12 MED ORDER — LISINOPRIL 20 MG PO TABS: 20.0000 mg | ORAL_TABLET | Freq: Every day | ORAL | 1 refills | Status: DC

## 2021-08-12 NOTE — Progress Notes (Signed)
Pt would like to have thyroid checked she just never feels well  ?Had hormones tested and those results were normal  ?

## 2021-08-13 LAB — LIPID PANEL
Chol/HDL Ratio: 6.4 ratio — ABNORMAL HIGH (ref 0.0–4.4)
Cholesterol, Total: 236 mg/dL — ABNORMAL HIGH (ref 100–199)
HDL: 37 mg/dL — ABNORMAL LOW (ref >39)
LDL Chol Calc (NIH): 165 mg/dL — ABNORMAL HIGH (ref 0–99)
Triglycerides: 183 mg/dL — ABNORMAL HIGH (ref 0–149)
VLDL Cholesterol Cal: 34 mg/dL (ref 5–40)

## 2021-08-13 LAB — VITAMIN D 25 HYDROXY (VIT D DEFICIENCY, FRACTURES): Vit D, 25-Hydroxy: 20.8 ng/mL — ABNORMAL LOW (ref 30.0–100.0)

## 2021-08-13 LAB — TSH+FREE T4
Free T4: 1.28 ng/dL (ref 0.82–1.77)
TSH: 1.4 u[IU]/mL (ref 0.450–4.500)

## 2021-08-14 ENCOUNTER — Other Ambulatory Visit (INDEPENDENT_AMBULATORY_CARE_PROVIDER_SITE_OTHER): Payer: Self-pay | Admitting: Primary Care

## 2021-08-14 DIAGNOSIS — E782 Mixed hyperlipidemia: Secondary | ICD-10-CM

## 2021-08-14 MED ORDER — ERGOCALCIFEROL 1.25 MG (50000 UT) PO CAPS: 50000.0000 [IU] | ORAL_CAPSULE | ORAL | 0 refills | Status: DC

## 2021-08-14 MED ORDER — ATORVASTATIN CALCIUM 80 MG PO TABS: 80.0000 mg | ORAL_TABLET | Freq: Every day | ORAL | 1 refills | Status: DC

## 2021-08-16 ENCOUNTER — Other Ambulatory Visit: Payer: Self-pay | Admitting: Adult Health

## 2021-08-16 DIAGNOSIS — F410 Panic disorder [episodic paroxysmal anxiety] without agoraphobia: Secondary | ICD-10-CM

## 2021-08-16 NOTE — Progress Notes (Signed)
?[facility] ? ?Gwendolyn Carrillo, is a 46 y.o. female ? ?[id number] ? ?[id number] ? ?DOB - [birth year] ? ?Chief Complaint  ?Patient presents with  ? Diabetes  ?    ? ?Subjective:  ? ?Gwendolyn Carrillo is a 46 y.o. female here today for a follow up visit for management of T2D and HTN.. Patient has No headache, No chest pain, No abdominal pain - No Nausea, No new weakness tingling or numbness, No Cough - shortness of breath. Denies polyuria, polydipsia or vision changes  ? ?No problems updated. ? ?No Known Allergies ? ?Past Medical History:  ?Diagnosis Date  ? Anxiety   ? Diabetes mellitus without complication (HCC)   ? Hypertension   ? ? ?Current Outpatient Medications on File Prior to Visit  ?Medication Sig Dispense Refill  ? ALPRAZolam (XANAX) 0.5 MG tablet Take 1 tablet (0.5 mg total) by mouth 3 (three) times daily as needed for anxiety. 60 tablet 2  ? Cholecalciferol (VITAMIN D3) 50 MCG (2000 UT) capsule Take 1 capsule (2,000 Units total) by mouth daily. 90 capsule 1  ? Continuous Blood Gluc Receiver (DEXCOM G6 RECEIVER) DEVI 1 Bag by Does not apply route in the morning, at noon, and at bedtime. 1 each 0  ? Continuous Blood Gluc Sensor (FREESTYLE LIBRE 2 SENSOR) MISC Use to check blood sugar TID. 1 each 2  ? Continuous Blood Gluc Transmit (DEXCOM G6 TRANSMITTER) MISC 1 application by Does not apply route as directed. 1 each 3  ? glucose blood (ACCU-CHEK GUIDE) test strip Check blood sugars 3 times a day before meals 100 strip 3  ? hydrOXYzine (VISTARIL) 50 MG capsule Take 1 capsule (50 mg total) by mouth 3 (three) times daily as needed. 30 capsule 2  ? insulin aspart (NOVOLOG FLEXPEN) 100 UNIT/ML FlexPen Inject 12 Units into the skin 3 (three) times daily with meals. Sig: blood sugars 200 -250 give 4 units of insulin, 251-300 give 6 units, 301-350 give 8 units, 351-400 give 10 units,> 400 give 12 units and call M.D. Discussed hypoglycemia protocol. 15 mL 11  ? Insulin Pen Needle (PEN NEEDLES)  32G X 5 MM MISC Use to inject Lantus (insulin) once daily. 100 each 1  ? LINZESS 145 MCG CAPS capsule Take 145 mcg by mouth daily as needed (constipation).    ? metoprolol tartrate (LOPRESSOR) 25 MG tablet Take 1 tablet (25 mg total) by mouth 2 (two) times daily. 60 tablet 3  ? pantoprazole (PROTONIX) 40 MG tablet TAKE 1 TABLET BY MOUTH EVERY DAY 90 tablet 1  ? busPIRone (BUSPAR) 10 MG tablet TAKE TWO TABLETS THREE TIMES DAILY FOR ANXIETY. (Patient not taking: Reported on 08/12/2021) 540 tablet 0  ? ?No current facility-administered medications on file prior to visit.  ? ?Comprehensive ROS Pertinent positive and negative noted in HPI   ?Objective:  ? ?Vitals:  ? 08/12/21 1104  ?BP: 123/87  ?Pulse: 94  ?Temp: (!) 97.3 ?F (36.3 ?C)  ?TempSrc: Oral  ?SpO2: 95%  ?Weight: 193 lb 3.2 oz (87.6 kg)  ?Height: 5\' 4"  (1.626 m)  ? ? ?Exam ?General appearance : Awake, alert, not in any distress. Speech Clear. Not toxic looking ?HEENT: Atraumatic and Normocephalic, pupils equally reactive to light and accomodation ?Neck: Supple, no JVD. No cervical lymphadenopathy.  ?Chest: Good air entry bilaterally, no added sounds  ?CVS: S1 S2 regular, no murmurs.  ?Abdomen: Bowel sounds present, Non tender and not distended with no gaurding, rigidity or rebound. ?Extremities: B/L Lower Ext shows no edema,  both legs are warm to touch ?Neurology: Awake alert, and oriented X 3, CN II-XII intact, Non focal ?Skin: No Rash ? ?Data Review ?Lab Results  ?Component Value Date  ? HGBA1C 7.2 (A) 08/12/2021  ? HGBA1C 11.9 (A) 04/01/2021  ? HGBA1C 8.6 (A) 12/30/2020  ? ? ?Assessment & Plan  ? ?1. Type 2 diabetes mellitus without complication, without long-term current use of insulin (HCC) ?Very please with progress previously 11.9 now 7.2. Continue monitoring carbs, exercising and taking medication as prescribed  ?- HgB A1c 7.2  ?- Lipid Panel ?- Insulin Glargine (BASAGLAR KWIKPEN) 100 UNIT/ML; Inject 18 Units into the skin daily.  Dispense: 3 mL; Refill:  3 ? ?2. Fatigue due to depression ?- TSH + free T4 ?- Vitamin D, 25-hydroxy ? ?3. Mixed hyperlipidemia ? Healthy lifestyle diet of fruits vegetables fish nuts whole grains and low saturated fat . Foods high in cholesterol or liver, fatty meats,cheese, butter avocados, nuts and seeds, chocolate and fried foods. ? ?  ?- Lipid Panel ? ?4. Primary hypertension ?Well controlled on dual therapy and monitoring sodium intake ?- hydrochlorothiazide (HYDRODIURIL) 25 MG tablet; Take 1 tablet (25 mg total) by mouth daily.  Dispense: 90 tablet; Refill: 1 ?- lisinopril (ZESTRIL) 20 MG tablet; Take 1 tablet (20 mg total) by mouth daily.  Dispense: 90 tablet; Refill: 1 ? ? ? ?Patient have been counseled extensively about nutrition and exercise. Other issues discussed during this visit include: low cholesterol diet, weight control and daily exercise, foot care, annual eye examinations at Ophthalmology, importance of adherence with medications and regular follow-up. We also discussed long term complications of uncontrolled diabetes and hypertension.  ? ?Return in about 3 months (around 11/11/2021) for DM. ? ?The patient was given clear instructions to go to ER or return to medical center if symptoms don't improve, worsen or new problems develop. The patient verbalized understanding. The patient was told to call to get lab results if they haven't heard anything in the next week.  ? ?This note has been created with Education officer, environmental. Any transcriptional errors are unintentional.  ? ?Grayce Sessions, NP ?08/16/2021, 7:53 PM  ?

## 2021-08-25 ENCOUNTER — Other Ambulatory Visit: Payer: Self-pay | Admitting: Cardiology

## 2021-08-25 ENCOUNTER — Ambulatory Visit (INDEPENDENT_AMBULATORY_CARE_PROVIDER_SITE_OTHER): Payer: Self-pay

## 2021-08-25 ENCOUNTER — Telehealth (INDEPENDENT_AMBULATORY_CARE_PROVIDER_SITE_OTHER): Payer: Self-pay | Admitting: Primary Care

## 2021-08-25 DIAGNOSIS — R002 Palpitations: Secondary | ICD-10-CM

## 2021-08-25 NOTE — Telephone Encounter (Signed)
Medication Refill - Medication: Pt called to report that her insurance no longer covers the protonix, she needs an urgent Rx for Omeprazole.  ? ?Has the patient contacted their pharmacy? Yes.   ?(Agent: If no, request that the patient contact the pharmacy for the refill. If patient does not wish to contact the pharmacy document the reason why and proceed with request.) ?(Agent: If yes, when and what did the pharmacy advise?) ? ?Preferred Pharmacy (with phone number or street name):  ?[id number] - [place], [place] - [address] AT [address]  ?[address] [place] [id number]  ?Phone: [phone] Fax: [phone]  ? ?Has the patient been seen for an appointment in the last year OR does the patient have an upcoming appointment? Yes.   ? ?Agent: Please be advised that RX refills may take up to 3 business days. We ask that you follow-up with your pharmacy. ? ?

## 2021-08-26 ENCOUNTER — Other Ambulatory Visit (INDEPENDENT_AMBULATORY_CARE_PROVIDER_SITE_OTHER): Payer: Self-pay | Admitting: Primary Care

## 2021-08-26 MED ORDER — OMEPRAZOLE 20 MG PO CPDR: DELAYED_RELEASE_CAPSULE | ORAL | 0 refills | Status: DC

## 2021-08-26 NOTE — Telephone Encounter (Signed)
Rx needed for Omeprazole. Insurance no longer covers protonix.  ?

## 2021-08-26 NOTE — Telephone Encounter (Signed)
Changed to omprezole d/c protonix ?

## 2021-11-01 ENCOUNTER — Other Ambulatory Visit (INDEPENDENT_AMBULATORY_CARE_PROVIDER_SITE_OTHER): Payer: Self-pay | Admitting: Primary Care

## 2021-11-02 ENCOUNTER — Other Ambulatory Visit (INDEPENDENT_AMBULATORY_CARE_PROVIDER_SITE_OTHER): Payer: Self-pay | Admitting: Primary Care

## 2021-11-02 DIAGNOSIS — E559 Vitamin D deficiency, unspecified: Secondary | ICD-10-CM

## 2021-11-02 MED ORDER — VITAMIN D3 50 MCG (2000 UT) PO CAPS: 2000.0000 [IU] | ORAL_CAPSULE | Freq: Every day | ORAL | 1 refills | Status: AC

## 2021-11-03 ENCOUNTER — Other Ambulatory Visit: Payer: Self-pay | Admitting: Adult Health

## 2021-11-03 DIAGNOSIS — F410 Panic disorder [episodic paroxysmal anxiety] without agoraphobia: Secondary | ICD-10-CM

## 2021-11-04 NOTE — Telephone Encounter (Signed)
Last filled 6/18, due 7/16

## 2021-11-06 ENCOUNTER — Other Ambulatory Visit: Payer: Self-pay

## 2021-11-06 DIAGNOSIS — F410 Panic disorder [episodic paroxysmal anxiety] without agoraphobia: Secondary | ICD-10-CM

## 2021-11-06 DIAGNOSIS — E782 Mixed hyperlipidemia: Secondary | ICD-10-CM

## 2021-11-06 NOTE — Telephone Encounter (Signed)
We need to have her come in for an appointment.

## 2021-11-06 NOTE — Telephone Encounter (Signed)
Pt has an appt 8/9

## 2021-11-06 NOTE — Telephone Encounter (Signed)
Please call to schedule an appt. Last seen 3/31 with RTC in 4 weeks.

## 2021-11-11 ENCOUNTER — Ambulatory Visit (INDEPENDENT_AMBULATORY_CARE_PROVIDER_SITE_OTHER): Payer: BC Managed Care – PPO | Admitting: Primary Care

## 2021-11-11 ENCOUNTER — Other Ambulatory Visit (INDEPENDENT_AMBULATORY_CARE_PROVIDER_SITE_OTHER): Payer: Self-pay | Admitting: Primary Care

## 2021-11-13 ENCOUNTER — Other Ambulatory Visit: Payer: Self-pay | Admitting: Adult Health

## 2021-11-13 DIAGNOSIS — F410 Panic disorder [episodic paroxysmal anxiety] without agoraphobia: Secondary | ICD-10-CM

## 2021-11-16 ENCOUNTER — Other Ambulatory Visit (INDEPENDENT_AMBULATORY_CARE_PROVIDER_SITE_OTHER): Payer: Self-pay | Admitting: Primary Care

## 2021-11-16 NOTE — Telephone Encounter (Signed)
Medication Refill - Medication: LINZESS 145 MCG CAPS capsule [[id number]]   Has the patient contacted their pharmacy? Yes.   (Agent: If no, request that the patient contact the pharmacy for the refill. If patient does not wish to contact the pharmacy document the reason why and proceed with request.) (Agent: If yes, when and what did the pharmacy advise?)  Preferred Pharmacy (with phone number or street name):  [id number] [place], [place] - [address] AT [address]  [address] [place] [address]  Phone: [phone] Fax: [phone]  Hours: Open 24 hours   Has the patient been seen for an appointment in the last year OR does the patient have an upcoming appointment? Yes.    Agent: Please be advised that RX refills may take up to 3 business days. We ask that you follow-up with your pharmacy.

## 2021-11-17 NOTE — Telephone Encounter (Signed)
Requested medication (s) are due for refill today: yes  Requested medication (s) are on the active medication list: hsitorical med  Last refill:  08/20/20   Future visit scheduled: yes  Notes to clinic:  historical provider   Requested Prescriptions  Pending Prescriptions Disp Refills   LINZESS 145 MCG CAPS capsule 30 capsule     Sig: Take 1 capsule (145 mcg total) by mouth daily as needed (constipation).     Gastroenterology: Irritable Bowel Syndrome Passed - 11/16/2021 11:19 AM      Passed - Valid encounter within last 12 months    Recent Outpatient Visits           3 months ago Type 2 diabetes mellitus without complication, without long-term current use of insulin (HCC)   [facility] Gwinda Passe P, NP   7 months ago Type 2 diabetes mellitus without complication, without long-term current use of insulin (HCC)   [facility] Gwinda Passe P, NP   9 months ago Type 2 diabetes mellitus without complication, with long-term current use of insulin [facility])   [facility] Lois Huxley, Cornelius Moras, RPH-CPP   10 months ago Primary hypertension   [facility] Grayce Sessions, NP   1 year ago Prediabetes   [facility] Grayce Sessions, NP       Future Appointments             In 1 week Randa Evens Kinnie Scales, NP [facility]

## 2021-11-18 ENCOUNTER — Other Ambulatory Visit (INDEPENDENT_AMBULATORY_CARE_PROVIDER_SITE_OTHER): Payer: Self-pay | Admitting: Primary Care

## 2021-11-25 ENCOUNTER — Encounter (INDEPENDENT_AMBULATORY_CARE_PROVIDER_SITE_OTHER): Payer: Self-pay | Admitting: Primary Care

## 2021-11-25 ENCOUNTER — Ambulatory Visit (INDEPENDENT_AMBULATORY_CARE_PROVIDER_SITE_OTHER): Payer: BC Managed Care – PPO | Admitting: Primary Care

## 2021-11-25 VITALS — BP 117/84 | HR 93 | Temp 98.2°F | Ht 64.0 in | Wt 194.4 lb

## 2021-11-25 DIAGNOSIS — I10 Essential (primary) hypertension: Secondary | ICD-10-CM | POA: Diagnosis not present

## 2021-11-25 DIAGNOSIS — R112 Nausea with vomiting, unspecified: Secondary | ICD-10-CM | POA: Diagnosis not present

## 2021-11-25 DIAGNOSIS — E119 Type 2 diabetes mellitus without complications: Secondary | ICD-10-CM | POA: Diagnosis not present

## 2021-11-25 LAB — POCT GLYCOSYLATED HEMOGLOBIN (HGB A1C): Hemoglobin A1C: 7.6 % — AB (ref 4.0–5.6)

## 2021-11-25 MED ORDER — BASAGLAR KWIKPEN 100 UNIT/ML ~~LOC~~ SOPN: 20.0000 [IU] | PEN_INJECTOR | Freq: Every day | SUBCUTANEOUS | 3 refills | Status: DC

## 2021-11-25 MED ORDER — ACCU-CHEK GUIDE VI STRP: ORAL_STRIP | 11 refills | Status: DC

## 2021-11-25 MED ORDER — HYDROCHLOROTHIAZIDE 25 MG PO TABS: 25.0000 mg | ORAL_TABLET | Freq: Every day | ORAL | 1 refills | Status: DC

## 2021-11-25 MED ORDER — LISINOPRIL 20 MG PO TABS: 20.0000 mg | ORAL_TABLET | Freq: Every day | ORAL | 1 refills | Status: DC

## 2021-11-25 MED ORDER — LINZESS 145 MCG PO CAPS: 145.0000 ug | ORAL_CAPSULE | Freq: Every day | ORAL | 1 refills | Status: DC | PRN

## 2021-11-25 NOTE — Progress Notes (Signed)
Subjective:  Patient ID: Gwendolyn Carrillo, female    DOB: 1976-01-07  Age: 46 y.o. MRN: [id number]  CC: Diabetes   HPI Ms. Gwendolyn Carrillo presents for follow-up of diabetes. Patient does check blood sugar at home. Also, management of HTN -unremarkable . Patient has No headache, No chest pain, No abdominal pain , No new weakness tingling or numbness, No Cough - shortness of breath . She does c/o n/v.   Compliant with meds - Yes Checking CBGs? Yes  Fasting avg - 70-280  Postprandial average -  Exercising regularly? - No Watching carbohydrate intake? - Yes Neuropathy ? - No Hypoglycemic events - No  - Recovers with :   Pertinent ROS:  Polyuria - No Polydipsia - No Vision problems - No  Medications as noted below. Taking them regularly without complication/adverse reaction being reported today.   History Gwendolyn Carrillo has a past medical history of Anxiety, Diabetes mellitus without complication (HCC), and Hypertension.   She has a past surgical history that includes Endometrial ablation; Cesarean section; Appendectomy; Cesarean section; and Hernia repair.   Her family history includes Blindness in her sister; Diabetes in her brother, father, and sister; Heart disease in her maternal grandmother and paternal grandmother; Hyperlipidemia in her father and mother; Hypertension in her brother, father, and mother; Stroke in her brother.She reports that she has been smoking cigarettes. She has never used smokeless tobacco. She reports current alcohol use. She reports current drug use. Drug: Marijuana.  Current Outpatient Medications on File Prior to Visit  Medication Sig Dispense Refill   ALPRAZolam (XANAX) 0.5 MG tablet Take 1 tablet (0.5 mg total) by mouth 3 (three) times daily as needed for anxiety. 72 tablet 0   atorvastatin (LIPITOR) 80 MG tablet Take 1 tablet (80 mg total) by mouth daily. 90 tablet 1   busPIRone (BUSPAR) 10 MG tablet TAKE TWO TABLETS THREE TIMES DAILY FOR ANXIETY. 540  tablet 0   Cholecalciferol (VITAMIN D3) 50 MCG (2000 UT) capsule Take 1 capsule (2,000 Units total) by mouth daily. 90 capsule 1   Continuous Blood Gluc Receiver (DEXCOM G6 RECEIVER) DEVI 1 Bag by Does not apply route in the morning, at noon, and at bedtime. 1 each 0   Continuous Blood Gluc Sensor (FREESTYLE LIBRE 2 SENSOR) MISC Use to check blood sugar TID. 1 each 2   Continuous Blood Gluc Transmit (DEXCOM G6 TRANSMITTER) MISC 1 application by Does not apply route as directed. 1 each 3   FLUoxetine (PROZAC) 20 MG capsule TAKE 1 CAPSULE BY MOUTH EVERY DAY 90 capsule 0   hydrOXYzine (VISTARIL) 50 MG capsule Take 1 capsule (50 mg total) by mouth 3 (three) times daily as needed. 30 capsule 2   Insulin Pen Needle (PEN NEEDLES) 32G X 5 MM MISC Use to inject Lantus (insulin) once daily. 100 each 1   metoprolol tartrate (LOPRESSOR) 25 MG tablet TAKE 1 TABLET BY MOUTH TWICE A DAY 180 tablet 1   omeprazole (PRILOSEC) 20 MG capsule TAKE 1 TABLET AS DAILY IF NEEDED FOR GASTRIC REFLUX 90 capsule 0   No current facility-administered medications on file prior to visit.    ROS Comprehensive ROS Pertinent positive and negative noted in HPI    Objective:  BP 117/84   Pulse 93   Temp 98.2 F (36.8 C) (Oral)   Ht 5\' 4"  (1.626 m)   Wt 194 lb 6.4 oz (88.2 kg)   SpO2 94%   BMI 33.37 kg/m   BP Readings from Last 3  Encounters:  11/25/21 117/84  08/12/21 123/87  06/08/21 126/81    Wt Readings from Last 3 Encounters:  11/25/21 194 lb 6.4 oz (88.2 kg)  08/12/21 193 lb 3.2 oz (87.6 kg)  06/08/21 193 lb (87.5 kg)    Physical Exam Vitals reviewed.  Constitutional:      Appearance: She is obese.  HENT:     Head: Normocephalic.     Right Ear: Tympanic membrane and external ear normal.     Left Ear: Tympanic membrane and external ear normal.     Nose: Nose normal.  Eyes:     Extraocular Movements: Extraocular movements intact.     Pupils: Pupils are equal, round, and reactive to light.   Cardiovascular:     Rate and Rhythm: Normal rate and regular rhythm.  Pulmonary:     Effort: Pulmonary effort is normal.     Breath sounds: Normal breath sounds.  Abdominal:     General: Bowel sounds are normal. There is distension.     Palpations: Abdomen is soft.  Musculoskeletal:        General: Normal range of motion.     Cervical back: Normal range of motion and neck supple.  Skin:    General: Skin is warm and dry.  Neurological:     Mental Status: She is oriented to person, place, and time.  Psychiatric:        Mood and Affect: Mood normal.        Behavior: Behavior normal.        Thought Content: Thought content normal.        Judgment: Judgment normal.    Lab Results  Component Value Date   HGBA1C 7.6 (A) 11/25/2021   HGBA1C 7.2 (A) 08/12/2021   HGBA1C 11.9 (A) 04/01/2021    Lab Results  Component Value Date   WBC 15.3 (H) 03/31/2021   HGB 15.0 03/31/2021   HCT 44.0 03/31/2021   PLT 291 03/31/2021   GLUCOSE 421 (H) 03/31/2021   CHOL 236 (H) 08/12/2021   TRIG 183 (H) 08/12/2021   HDL 37 (L) 08/12/2021   LDLCALC 165 (H) 08/12/2021   ALT 42 02/18/2021   AST 28 02/18/2021   NA 133 (L) 03/31/2021   K 3.6 03/31/2021   CL 93 (L) 03/31/2021   CREATININE 0.89 03/31/2021   BUN 21 (H) 03/31/2021   CO2 22 03/31/2021   TSH 1.400 08/12/2021   HGBA1C 7.6 (A) 11/25/2021     Assessment & Plan:   Gwendolyn Carrillo was seen today for diabetes.  Diagnoses and all orders for this visit:  Type 2 diabetes mellitus without complication, without long-term current use of insulin (HCC) Continue to monitor foods that are high in carbohydrates are the following rice, potatoes, breads, sugars, and pastas.  Reduction in the intake (eating) will assist in lowering your blood sugars.  -     HgB A1c 7.6  -     Insulin Glargine (BASAGLAR KWIKPEN) 100 UNIT/ML; Inject 20 Units into the skin daily.( Increased by 2 units)   Primary hypertension Well controlled blood pressure goal of less  than 130/80, low-sodium, DASH diet, medication compliance, 150 minutes of moderate intensity exercise per week. Discussed medication compliance, adverse effects.  -     lisinopril (ZESTRIL) 20 MG tablet; Take 1 tablet (20 mg total) by mouth daily. -     hydrochlorothiazide (HYDRODIURIL) 25 MG tablet; Take 1 tablet (25 mg total) by mouth daily.  Other orders -     glucose  blood (ACCU-CHEK GUIDE) test strip; Check blood sugars 3 times a day before meals -     LINZESS 145 MCG CAPS capsule; Take 1 capsule (145 mcg total) by mouth daily as needed (constipation).  Nausea and vomiting, unspecified vomiting type -     ondansetron (ZOFRAN-ODT) 4 MG disintegrating tablet; Take 1 tablet (4 mg total) by mouth every 8 (eight) hours as needed for nausea or vomiting.   I have discontinued Bitania J. Tavis's NovoLOG FlexPen. I have also changed her Basaglar KwikPen and Linzess. Additionally, I am having her start on ondansetron. Lastly, I am having her maintain her Pen Needles, FreeStyle Libre 2 Sensor, hydrOXYzine, Dexcom G6 Receiver, Dexcom G6 Transmitter, busPIRone, atorvastatin, metoprolol tartrate, Vitamin D3, ALPRAZolam, omeprazole, FLUoxetine, Accu-Chek Guide, lisinopril, and hydrochlorothiazide.  Meds ordered this encounter  Medications   glucose blood (ACCU-CHEK GUIDE) test strip    Sig: Check blood sugars 3 times a day before meals    Dispense:  300 strip    Refill:  11    Order Specific Question:   Supervising Provider    Answer:   Quentin Angst [[id number]]    Order Specific Question:   Number of strips    Answer:   100    Order Specific Question:   Number of lancets    Answer:   100   lisinopril (ZESTRIL) 20 MG tablet    Sig: Take 1 tablet (20 mg total) by mouth daily.    Dispense:  90 tablet    Refill:  1    Order Specific Question:   Supervising Provider    Answer:   Quentin Angst [[id number]]   Insulin Glargine (BASAGLAR KWIKPEN) 100 UNIT/ML    Sig: Inject 20 Units into the  skin daily.    Dispense:  3 mL    Refill:  3    Order Specific Question:   Supervising Provider    Answer:   Quentin Angst [[id number]]   hydrochlorothiazide (HYDRODIURIL) 25 MG tablet    Sig: Take 1 tablet (25 mg total) by mouth daily.    Dispense:  90 tablet    Refill:  1    Order Specific Question:   Supervising Provider    Answer:   Quentin Angst [[id number]]   LINZESS 145 MCG CAPS capsule    Sig: Take 1 capsule (145 mcg total) by mouth daily as needed (constipation).    Dispense:  90 capsule    Refill:  1    Order Specific Question:   Supervising Provider    Answer:   Quentin Angst [[id number]]   ondansetron (ZOFRAN-ODT) 4 MG disintegrating tablet    Sig: Take 1 tablet (4 mg total) by mouth every 8 (eight) hours as needed for nausea or vomiting.    Dispense:  30 tablet    Refill:  1    Order Specific Question:   Supervising Provider    Answer:   Quentin Angst [id number]     Follow-up:   Return in about 3 months (around 02/25/2022) for DM/fasting.  The above assessment and management plan was discussed with the patient. The patient verbalized understanding of and has agreed to the management plan. Patient is aware to call the clinic if symptoms fail to improve or worsen. Patient is aware when to return to the clinic for a follow-up visit. Patient educated on when it is appropriate to go to the emergency department.   Gwinda Passe, NP-C

## 2021-12-01 MED ORDER — ONDANSETRON 4 MG PO TBDP: 4.0000 mg | ORAL_TABLET | Freq: Three times a day (TID) | ORAL | 1 refills | Status: DC | PRN

## 2021-12-02 ENCOUNTER — Ambulatory Visit (INDEPENDENT_AMBULATORY_CARE_PROVIDER_SITE_OTHER): Payer: BC Managed Care – PPO | Admitting: Adult Health

## 2021-12-02 ENCOUNTER — Encounter: Payer: Self-pay | Admitting: Adult Health

## 2021-12-02 DIAGNOSIS — F410 Panic disorder [episodic paroxysmal anxiety] without agoraphobia: Secondary | ICD-10-CM | POA: Diagnosis not present

## 2021-12-02 DIAGNOSIS — F411 Generalized anxiety disorder: Secondary | ICD-10-CM

## 2021-12-02 DIAGNOSIS — G4700 Insomnia, unspecified: Secondary | ICD-10-CM

## 2021-12-02 DIAGNOSIS — F422 Mixed obsessional thoughts and acts: Secondary | ICD-10-CM

## 2021-12-02 DIAGNOSIS — F331 Major depressive disorder, recurrent, moderate: Secondary | ICD-10-CM

## 2021-12-02 MED ORDER — ALPRAZOLAM 0.5 MG PO TABS: 0.5000 mg | ORAL_TABLET | Freq: Three times a day (TID) | ORAL | 2 refills | Status: DC | PRN

## 2021-12-02 NOTE — Progress Notes (Signed)
Gwendolyn Carrillo [id number] 03/30/1976 46 y.o.  Subjective:   Patient ID:  Gwendolyn Carrillo is a 46 y.o. (DOB 06-14-1975) female.  Chief Complaint: No chief complaint on file.   HPI Gwendolyn Carrillo presents to the office today for follow-up of MDD, mixed obsessional thoughts, insomnia, panic attacks, and GAD.   Describes mood today as "ok". Decreased tearfulness. Pleasant. Mood symptoms - reports some depression and anxiety. Decreased irritability. Reports panic attacks. Feels like her mood has declined, but unable to identify a precipitant. Reports situational stressors since last visit. She and partner broke up and she is living alone. Stating "I feel like I'm doing ok". Daughter with her on the weekends - 17 and starting her senior year. Denies both ETOH and THC x 3 months ago. Decreased interest and motivation. Taking medications as prescribed. Seeing therapist.  Energy levels lower. Active, does not have a regular exercise routine.  Able to enjoy usual interests and activities. Single. Lives alone. Has 4 children. Family local. Spending time with family. Appetite adequate.. Weight gain - 194 pounds. Diabetic. Sleep is consistent. Averages 8 to 9 hours. Focus and concentration difficulties.Completing tasks. Managing some aspects of household. Work for [employer] - 12 to 9. Denies SI or HI.  Denies AH or VH.   GAD-7    Flowsheet Row Office Visit from 11/25/2021 in [facility] Office Visit from 08[facility]12/2021 in [facility] Office Visit from 04/01/2021 in [facility] Office Visit from 12/30/2020 in [facility] Office Visit from 09/29/2020 in [facility]  Total GAD-7 Score 5 8 16 10 6       PHQ2-9    Flowsheet Row Office Visit from 11/25/2021 in [facility] Office Visit from 08/12/2021 in [facility] Office Visit from 04/01/2021 in [facility]  [facility] Office Visit from 12/30/2020 in [facility] Office Visit from 09/29/2020 in [facility]  PHQ-2 Total Score 2 5 3 4 5   PHQ-9 Total Score 8 16 13 11 12       Flowsheet Row ED from 03/31/2021 in [facility] ED from 03/24/2021 in [facility] ED from 02/18/2021 in [facility]  C-SSRS RISK CATEGORY No Risk No Risk No Risk        Review of Systems:  Review of Systems  Medications: I have reviewed the patient's current medications.  Current Outpatient Medications  Medication Sig Dispense Refill   ALPRAZolam (XANAX) 0.5 MG tablet Take 1 tablet (0.5 mg total) by mouth 3 (three) times daily as needed for anxiety. 90 tablet 2   atorvastatin (LIPITOR) 80 MG tablet Take 1 tablet (80 mg total) by mouth daily. 90 tablet 1   busPIRone (BUSPAR) 10 MG tablet TAKE TWO TABLETS THREE TIMES DAILY FOR ANXIETY. 540 tablet 0   Cholecalciferol (VITAMIN D3) 50 MCG (2000 UT) capsule Take 1 capsule (2,000 Units total) by mouth daily. 90 capsule 1   Continuous Blood Gluc Receiver (DEXCOM G6 RECEIVER) DEVI 1 Bag by Does not apply route in the morning, at noon, and at bedtime. 1 each 0   Continuous Blood Gluc Sensor (FREESTYLE LIBRE 2 SENSOR) MISC Use to check blood sugar TID. 1 each 2   Continuous Blood Gluc Transmit (DEXCOM G6 TRANSMITTER) MISC 1 application by Does not apply route as directed. 1 each 3   FLUoxetine (PROZAC) 20  MG capsule TAKE 1 CAPSULE BY MOUTH EVERY DAY 90 capsule 0   glucose blood (ACCU-CHEK GUIDE) test strip Check blood sugars 3 times a day before meals 300 strip 11   hydrochlorothiazide (HYDRODIURIL) 25 MG tablet Take 1 tablet (25 mg total) by mouth daily. 90 tablet 1   hydrOXYzine (VISTARIL) 50 MG capsule Take 1 capsule (50 mg total) by mouth 3 (three) times daily as needed. 30 capsule 2   Insulin Glargine (BASAGLAR KWIKPEN) 100 UNIT/ML  Inject 20 Units into the skin daily. 3 mL 3   Insulin Pen Needle (PEN NEEDLES) 32G X 5 MM MISC Use to inject Lantus (insulin) once daily. 100 each 1   LINZESS 145 MCG CAPS capsule Take 1 capsule (145 mcg total) by mouth daily as needed (constipation). 90 capsule 1   lisinopril (ZESTRIL) 20 MG tablet Take 1 tablet (20 mg total) by mouth daily. 90 tablet 1   metoprolol tartrate (LOPRESSOR) 25 MG tablet TAKE 1 TABLET BY MOUTH TWICE A DAY 180 tablet 1   omeprazole (PRILOSEC) 20 MG capsule TAKE 1 TABLET AS DAILY IF NEEDED FOR GASTRIC REFLUX 90 capsule 0   ondansetron (ZOFRAN-ODT) 4 MG disintegrating tablet Take 1 tablet (4 mg total) by mouth every 8 (eight) hours as needed for nausea or vomiting. 30 tablet 1   No current facility-administered medications for this visit.    Medication Side Effects: None  Allergies: No Known Allergies  Past Medical History:  Diagnosis Date   Anxiety    Diabetes mellitus without complication (HCC)    Hypertension     Past Medical History, Surgical history, Social history, and Family history were reviewed and updated as appropriate.   Please see review of systems for further details on the patient's review from today.   Objective:   Physical Exam:  There were no vitals taken for this visit.  Physical Exam Constitutional:      General: She is not in acute distress. Musculoskeletal:        General: No deformity.  Neurological:     Mental Status: She is alert and oriented to person, place, and time.     Coordination: Coordination normal.  Psychiatric:        Attention and Perception: Attention and perception normal. She does not perceive auditory or visual hallucinations.        Mood and Affect: Mood normal. Mood is not anxious or depressed. Affect is not labile, blunt, angry or inappropriate.        Speech: Speech normal.        Behavior: Behavior normal.        Thought Content: Thought content normal. Thought content is not paranoid or delusional.  Thought content does not include homicidal or suicidal ideation. Thought content does not include homicidal or suicidal plan.        Cognition and Memory: Cognition and memory normal.        Judgment: Judgment normal.     Comments: Insight intact     Lab Review:     Component Value Date/Time   NA 133 (L) 03/31/2021 1125   K 3.6 03/31/2021 1125   CL 93 (L) 03/31/2021 0940   CO2 22 03/31/2021 0940   GLUCOSE 421 (H) 03/31/2021 0940   BUN 21 (H) 03/31/2021 0940   CREATININE 0.89 03/31/2021 0940   CALCIUM 9.9 03/31/2021 0940   PROT 8.5 (H) 02/18/2021 0831   ALBUMIN 4.5 02/18/2021 0831   AST 28 02/18/2021 0831   ALT 42 02/18/2021  0831   ALKPHOS 78 02/18/2021 0831   BILITOT 0.8 02/18/2021 0831   GFRNONAA >60 03/31/2021 0940   GFRAA >60 10/10/2019 1441       Component Value Date/Time   WBC 15.3 (H) 03/31/2021 0940   RBC 4.78 03/31/2021 0940   HGB 15.0 03/31/2021 1125   HCT 44.0 03/31/2021 1125   PLT 291 03/31/2021 0940   MCV 88.5 03/31/2021 0940   MCH 31.0 03/31/2021 0940   MCHC 35.0 03/31/2021 0940   RDW 12.2 03/31/2021 0940   LYMPHSABS 1.6 03/31/2021 0940   MONOABS 1.0 03/31/2021 0940   EOSABS 0.1 03/31/2021 0940   BASOSABS 0.0 03/31/2021 0940    No results found for: "POCLITH", "LITHIUM"   No results found for: "PHENYTOIN", "PHENOBARB", "VALPROATE", "CBMZ"   .res Assessment: Plan:    Plan:  PDMP reviewed  Hydroxyzine 50mg  at hs Xanax 0.5mg  to TID for severe anxiety  Continue therapy - [facility] - therapist.  RTC 3 months  Patient weeks advised to contact office with any questions, adverse effects, or acute worsening in signs and symptoms.  Discussed potential benefits, risk, and side effects of benzodiazepines to include potential risk of tolerance and dependence, as well as possible drowsiness. Advised patient not to drive if experiencing drowsiness and to take lowest possible effective dose to minimize risk of dependence and tolerance. Diagnoses  and all orders for this visit:  Generalized anxiety disorder  Panic attacks -     ALPRAZolam (XANAX) 0.5 MG tablet; Take 1 tablet (0.5 mg total) by mouth 3 (three) times daily as needed for anxiety.  Insomnia, unspecified type  Mixed obsessional thoughts and acts  Major depressive disorder, recurrent episode, moderate (HCC)     Please see After Visit Summary for patient specific instructions.  Future Appointments  Date Time Provider Department Center  03/03/2022 10:50 AM 13/11/2021, NP [facility] None    No orders of the defined types were placed in this encounter.   -------------------------------

## 2022-01-30 ENCOUNTER — Other Ambulatory Visit (INDEPENDENT_AMBULATORY_CARE_PROVIDER_SITE_OTHER): Payer: Self-pay | Admitting: Primary Care

## 2022-02-01 ENCOUNTER — Other Ambulatory Visit (INDEPENDENT_AMBULATORY_CARE_PROVIDER_SITE_OTHER): Payer: Self-pay

## 2022-02-01 ENCOUNTER — Other Ambulatory Visit (INDEPENDENT_AMBULATORY_CARE_PROVIDER_SITE_OTHER): Payer: Self-pay | Admitting: Primary Care

## 2022-02-01 DIAGNOSIS — E782 Mixed hyperlipidemia: Secondary | ICD-10-CM

## 2022-02-01 MED ORDER — OMEPRAZOLE 20 MG PO CPDR: DELAYED_RELEASE_CAPSULE | ORAL | 0 refills | Status: DC

## 2022-02-10 ENCOUNTER — Encounter: Payer: Self-pay | Admitting: Nurse Practitioner

## 2022-02-10 ENCOUNTER — Ambulatory Visit (INDEPENDENT_AMBULATORY_CARE_PROVIDER_SITE_OTHER): Payer: BC Managed Care – PPO | Admitting: Nurse Practitioner

## 2022-02-10 VITALS — BP 132/84 | HR 93 | Ht 64.5 in | Wt 201.0 lb

## 2022-02-10 DIAGNOSIS — R4586 Emotional lability: Secondary | ICD-10-CM | POA: Diagnosis not present

## 2022-02-10 DIAGNOSIS — F332 Major depressive disorder, recurrent severe without psychotic features: Secondary | ICD-10-CM

## 2022-02-10 DIAGNOSIS — N912 Amenorrhea, unspecified: Secondary | ICD-10-CM

## 2022-02-10 DIAGNOSIS — F411 Generalized anxiety disorder: Secondary | ICD-10-CM

## 2022-02-10 NOTE — Progress Notes (Signed)
   Gwendolyn Carrillo [birth year] [id number]   History:  46 y.o. [id number] presents as new patient to establish care and discuss hysterectomy and mood changes. H/O 2009 ablation, has light spotting every few months. Reports severe anxiety and depression each month around time menses would start. She feels she only gets 1 week of feeling "good" each month. Reports lack of motivation and decreased libido. Is wondering if she is going through menopause because her psychiatrist is unsure what is going on and recommended she be seen for this. Saw 2 providers at [facility] tried and did not tolerate. She then tried Estrogel and had a racing heart. Has some night sweats but it is mostly her moods that bother her. Sees therapist. H/O GAD, anxiety, panic attacks managed by psychiatry. Hydroxyzine 50 mg HS, Xanax TID as needed. She has tried many antidepressants and does not tolerate. Has lots of personal stress that began in 2013 which was around the time she began feeling worsening moods, but became severe in 2015. One son has schizophrenia and is violent and homeless, other son in prison, recent breakup after 5 years together, does not enjoy job and is looking for new employment. Normal pap history. DM, HLD, HTN managed by PCP. Smokes ~4 cigarettes per day, began smoking at age 46. She is tearful through visit.   Gynecologic History No LMP recorded. Patient has had an ablation.   Contraception/Family planning: tubal ligation Sexually active: No  Health Maintenance Last Pap: 10/2021. Results were: Normal per patient Last mammogram: 10/2021. Results were: Normal per patient Last colonoscopy: Not indicated Last Dexa: Not indicated  Past medical history, past surgical history, family history and social history were all reviewed and documented in the EPIC chart. Works in call center for [employer]. 2 sons, 2 daughters.   ROS:  A ROS was performed and pertinent positives and negatives are  included.  Exam:  Vitals:   02/10/22 1044  BP: 132/84  Pulse: 93  SpO2: 98%  Weight: 201 lb (91.2 kg)  Height: 5' 4.5" (1.638 m)   Body mass index is 33.97 kg/m.  General appearance:  Normal Psych: depressed mood, crying during visit  Assessment/Plan:  46 y.o. [id number] to establish care.   Amenorrhea - Plan: Estradiol, Follicle stimulating hormone. 2007 Ablation, has light spotting every few months.   MDD (major depressive disorder), recurrent severe, without psychosis ([place]) - Sees psychiatry and therapist. Has not tolerated any antidepressants. Very emotional during visit. Does not have motivation to do anything.   GAD (generalized anxiety disorder) - Hydroxyzine 50 mg HS, Xanax 0.25 mg TID as needed.   Mood changes - Plan: Estradiol, Follicle stimulating hormone. Will check hormones today. She has been seen by psychiatry and [place] office. Has tried POPs, topical estrogen. Did not tolerate either. Discussed that checking hormones if perimenopausal may not tell us much and rechecking in 3 months recommended. Limited in treatment due to smoking status, HTN, and intolerance to medications. BP controlled. I believe her recent worsening depression is multi-factorial. Explained why hysterectomy would not improve her symptoms and she verbalized understanding. Discussed option of trying low dose estradiol patch but with good BP monitoring and she is agreeable to try. Other recommendation is to see integrative medicine and request genesight testing through psychiatry if not done already.   Will determine POC after labs.     [place], 12:22 PM 02/10/2022

## 2022-02-11 ENCOUNTER — Telehealth: Payer: Self-pay | Admitting: *Deleted

## 2022-02-11 ENCOUNTER — Other Ambulatory Visit: Payer: Self-pay | Admitting: Nurse Practitioner

## 2022-02-11 DIAGNOSIS — Z30011 Encounter for initial prescription of contraceptive pills: Secondary | ICD-10-CM

## 2022-02-11 LAB — FOLLICLE STIMULATING HORMONE: FSH: 8.1 m[IU]/mL

## 2022-02-11 LAB — ESTRADIOL: Estradiol: 187 pg/mL

## 2022-02-11 MED ORDER — NORETHINDRONE 0.35 MG PO TABS: 1.0000 | ORAL_TABLET | Freq: Every day | ORAL | 1 refills | Status: DC

## 2022-02-11 NOTE — Telephone Encounter (Signed)
I will send in POPs. There are limited integrative medicine locations, so I recommend she reach out to [facility] in [place]. She does not need a referral for this. I recommend GeneSight testing. This is something she can register for online. It is a kit that requires a buccal swab. Make her aware that there will be a cost but it will depend on her insurance coverage. She can go to [email or web address] for this information. If she completes this, she should give to psychiatry for recommendations.

## 2022-02-11 NOTE — Telephone Encounter (Signed)
Patient called from my chart message sent. Reports she is willing to try the progesterone only pill. Can be sent to [facility]. She is willing to integrative medicine, she has not done GeneSight testing. She asked does she need a referral? Please advise

## 2022-02-12 ENCOUNTER — Other Ambulatory Visit: Payer: Self-pay | Admitting: Adult Health

## 2022-02-12 DIAGNOSIS — F410 Panic disorder [episodic paroxysmal anxiety] without agoraphobia: Secondary | ICD-10-CM

## 2022-02-26 ENCOUNTER — Other Ambulatory Visit: Payer: Self-pay | Admitting: Cardiology

## 2022-02-26 DIAGNOSIS — R002 Palpitations: Secondary | ICD-10-CM

## 2022-03-03 ENCOUNTER — Ambulatory Visit (INDEPENDENT_AMBULATORY_CARE_PROVIDER_SITE_OTHER): Payer: BC Managed Care – PPO | Admitting: Primary Care

## 2022-03-03 ENCOUNTER — Ambulatory Visit (INDEPENDENT_AMBULATORY_CARE_PROVIDER_SITE_OTHER): Payer: BC Managed Care – PPO | Admitting: Adult Health

## 2022-03-03 ENCOUNTER — Encounter: Payer: Self-pay | Admitting: Adult Health

## 2022-03-03 ENCOUNTER — Encounter (INDEPENDENT_AMBULATORY_CARE_PROVIDER_SITE_OTHER): Payer: Self-pay | Admitting: Primary Care

## 2022-03-03 VITALS — BP 119/87 | HR 96 | Resp 16 | Wt 194.8 lb

## 2022-03-03 DIAGNOSIS — E782 Mixed hyperlipidemia: Secondary | ICD-10-CM | POA: Diagnosis not present

## 2022-03-03 DIAGNOSIS — Z23 Encounter for immunization: Secondary | ICD-10-CM

## 2022-03-03 DIAGNOSIS — I10 Essential (primary) hypertension: Secondary | ICD-10-CM

## 2022-03-03 DIAGNOSIS — G4700 Insomnia, unspecified: Secondary | ICD-10-CM | POA: Diagnosis not present

## 2022-03-03 DIAGNOSIS — E119 Type 2 diabetes mellitus without complications: Secondary | ICD-10-CM

## 2022-03-03 DIAGNOSIS — F422 Mixed obsessional thoughts and acts: Secondary | ICD-10-CM | POA: Diagnosis not present

## 2022-03-03 DIAGNOSIS — F331 Major depressive disorder, recurrent, moderate: Secondary | ICD-10-CM

## 2022-03-03 DIAGNOSIS — F411 Generalized anxiety disorder: Secondary | ICD-10-CM | POA: Diagnosis not present

## 2022-03-03 DIAGNOSIS — F410 Panic disorder [episodic paroxysmal anxiety] without agoraphobia: Secondary | ICD-10-CM | POA: Diagnosis not present

## 2022-03-03 DIAGNOSIS — E559 Vitamin D deficiency, unspecified: Secondary | ICD-10-CM

## 2022-03-03 LAB — POCT GLYCOSYLATED HEMOGLOBIN (HGB A1C): HbA1c, POC (controlled diabetic range): 8 % — AB (ref 0.0–7.0)

## 2022-03-03 MED ORDER — TRULICITY 0.75 MG/0.5ML ~~LOC~~ SOAJ: 0.7500 mg | SUBCUTANEOUS | 2 refills | Status: DC

## 2022-03-03 MED ORDER — PEN NEEDLES 32G X 5 MM MISC: 6 refills | Status: DC

## 2022-03-03 NOTE — Progress Notes (Signed)
Subjective:  Patient ID: Gwendolyn Carrillo, female    DOB: [birth year]  Age: 46 y.o. MRN: [id number]  CC: Diabetes   HPI Ms. Gwendolyn Carrillo presents for follow-up of diabetes. Patient does not check blood sugar at home  Compliant with meds - Yes Checking CBGs? No  Fasting avg -   Postprandial average -  Exercising regularly? - Yes Watching carbohydrate intake? - Yes Neuropathy ? - No Hypoglycemic events - No  - Recovers with :   Pertinent ROS:  Polyuria - No Polydipsia - No Vision problems - No  Medications as noted below. Taking them regularly without complication/adverse reaction being reported today.   History Gwendolyn Carrillo has a past medical history of Anxiety, Diabetes mellitus without complication ([place]), Gastroparesis, Hypertension, and Major depressive disorder.   She has a past surgical history that includes Endometrial ablation; Cesarean section; Appendectomy; Hernia repair; Tubal ligation; Colonoscopy; and Upper gastrointestinal endoscopy.   Her family history includes Blindness in her sister; Diabetes in her brother, father, maternal aunt, maternal grandmother, paternal grandmother, and sister; Heart disease in her maternal grandmother and paternal grandmother; Hyperlipidemia in her father and mother; Hypertension in her brother, father, maternal grandmother, mother, and paternal grandmother; Stroke in her brother.She reports that she has been smoking cigarettes. She has been smoking an average of .25 packs per day. She has never used smokeless tobacco. She reports current alcohol use. She reports that she does not currently use drugs after having used the following drugs: Marijuana.  Current Outpatient Medications on File Prior to Visit  Medication Sig Dispense Refill   ALPRAZolam (XANAX) 0.5 MG tablet Take 1 tablet (0.5 mg total) by mouth 3 (three) times daily as needed for anxiety. 90 tablet 2   atorvastatin (LIPITOR) 80 MG tablet TAKE 1 TABLET BY MOUTH EVERY DAY 90  tablet 1   Cholecalciferol (VITAMIN D3) 50 MCG (2000 UT) capsule Take 1 capsule (2,000 Units total) by mouth daily. (Patient not taking: Reported on 02/10/2022) 90 capsule 1   Continuous Blood Gluc Receiver (DEXCOM G6 RECEIVER) DEVI 1 Bag by Does not apply route in the morning, at noon, and at bedtime. 1 each 0   Continuous Blood Gluc Sensor (FREESTYLE LIBRE 2 SENSOR) MISC Use to check blood sugar TID. 1 each 2   Continuous Blood Gluc Transmit (DEXCOM G6 TRANSMITTER) MISC 1 application by Does not apply route as directed. 1 each 3   glucose blood (ACCU-CHEK GUIDE) test strip Check blood sugars 3 times a day before meals 300 strip 11   hydrochlorothiazide (HYDRODIURIL) 25 MG tablet Take 1 tablet (25 mg total) by mouth daily. 90 tablet 1   hydrOXYzine (VISTARIL) 50 MG capsule Take 1 capsule (50 mg total) by mouth 3 (three) times daily as needed. 30 capsule 2   Insulin Glargine (BASAGLAR KWIKPEN) 100 UNIT/ML Inject 20 Units into the skin daily. 3 mL 3   Insulin Pen Needle (PEN NEEDLES) 32G X 5 MM MISC Use to inject Lantus (insulin) once daily. 100 each 1   LINZESS 145 MCG CAPS capsule Take 1 capsule (145 mcg total) by mouth daily as needed (constipation). 90 capsule 1   lisinopril (ZESTRIL) 20 MG tablet Take 1 tablet (20 mg total) by mouth daily. 90 tablet 1   metoprolol tartrate (LOPRESSOR) 25 MG tablet TAKE 1 TABLET BY MOUTH TWICE A DAY 180 tablet 1   norethindrone (ORTHO MICRONOR) 0.35 MG tablet Take 1 tablet (0.35 mg total) by mouth daily. 84 tablet 1   NOVOLOG 100  UNIT/ML injection SMARTSIG:12 Unit(s) SUB-Q 3 Times Daily     omeprazole (PRILOSEC) 20 MG capsule TAKE 1 TABLET AS DAILY IF NEEDED FOR GASTRIC REFLUX 90 capsule 0   ondansetron (ZOFRAN-ODT) 4 MG disintegrating tablet Take 1 tablet (4 mg total) by mouth every 8 (eight) hours as needed for nausea or vomiting. 30 tablet 1   No current facility-administered medications on file prior to visit.    ROS Review of Systems  Objective:  BP  119/87   Pulse 96   Resp 16   Wt 194 lb 12.8 oz (88.4 kg)   SpO2 97%   BMI 32.92 kg/m   BP Readings from Last 3 Encounters:  03/03/22 119/87  02/10/22 132/84  11/25/21 117/84    Wt Readings from Last 3 Encounters:  03/03/22 194 lb 12.8 oz (88.4 kg)  02/10/22 201 lb (91.2 kg)  11/25/21 194 lb 6.4 oz (88.2 kg)    Physical Exam General: No apparent distress. Eyes: Extraocular eye movements intact, pupils equal and round. Neck: Supple, trachea midline. Thyroid: No enlargement, mobile without fixation, no tenderness. Cardiovascular: Regular rhythm and rate, no murmur, normal radial pulses. Respiratory: Normal respiratory effort, clear to auscultation. Gastrointestinal: Normal pitch active bowel sounds, nontender abdomen without distention or appreciable hepatomegaly. Musculoskeletal: Normal muscle tone, no tenderness on palpation of tibia, no excessive thoracic kyphosis. Skin: Appropriate warmth, no visible rash. Mental status: Alert, conversant, speech clear, thought logical, appropriate mood and affect, no hallucinations or delusions evident. Hematologic/lymphatic: No cervical adenopathy, no visible ecchymoses.  Lab Results  Component Value Date   HGBA1C 7.6 (A) 11/25/2021   HGBA1C 7.2 (A) 08/12/2021   HGBA1C 11.9 (A) 04/01/2021    Lab Results  Component Value Date   WBC 15.3 (H) 03/31/2021   HGB 15.0 03/31/2021   HCT 44.0 03/31/2021   PLT 291 03/31/2021   GLUCOSE 421 (H) 03/31/2021   CHOL 236 (H) 08/12/2021   TRIG 183 (H) 08/12/2021   HDL 37 (L) 08/12/2021   LDLCALC 165 (H) 08/12/2021   ALT 42 02/18/2021   AST 28 02/18/2021   NA 133 (L) 03/31/2021   K 3.6 03/31/2021   CL 93 (L) 03/31/2021   CREATININE 0.89 03/31/2021   BUN 21 (H) 03/31/2021   CO2 22 03/31/2021   TSH 1.400 08/12/2021   HGBA1C 7.6 (A) 11/25/2021     Assessment & Plan:   Gwendolyn Carrillo was seen today for diabetes.  Diagnoses and all orders for this visit:  Type 2 diabetes mellitus without  complication, without long-term current use of insulin (HCC - educated on lifestyle modifications, including but not limited to diet choices and adding exercise to daily routine.   -     POCT glycosylated hemoglobin (Hb A1C) -     Ambulatory referral to Ophthalmology -     Microalbumin, urine  Primary hypertension Well-controlled less than 130/90 and monitoring sodium intake and eliminated processed foods -     Comprehensive metabolic panel  Vitamin D deficiency Fatigue and history of  Mixed hyperlipidemia -     Lipid panel  Need for immunization against influenza -     Flu Vaccine QUAD 41mo+IM (Fluarix, Fluzone & Alfiuria Quad PF)   I am having Gwendolyn Carrillo maintain her Pen Needles, FreeStyle Libre 2 Sensor, hydrOXYzine, Dexcom G6 Receiver, Dexcom G6 Transmitter, Vitamin D3, Accu-Chek Guide, lisinopril, Basaglar KwikPen, hydrochlorothiazide, Linzess, ondansetron, ALPRAZolam, atorvastatin, omeprazole, NovoLOG, norethindrone, and metoprolol tartrate.  No orders of the defined types were placed in this encounter.  Follow-up:   No follow-ups on file.  The above assessment and management plan was discussed with the patient. The patient verbalized understanding of and has agreed to the management plan. Patient is aware to call the clinic if symptoms fail to improve or worsen. Patient is aware when to return to the clinic for a follow-up visit. Patient educated on when it is appropriate to go to the emergency department.   Juluis Mire, NP-C

## 2022-03-03 NOTE — Progress Notes (Signed)
Gwendolyn Carrillo [id number] [birth year] 46 y.o.  Subjective:   Patient ID:  Gwendolyn Carrillo is a 46 y.o. (DOB 1976/04/04) female.  Chief Complaint: No chief complaint on file.   HPI Gwendolyn Carrillo presents to the office today for follow-up of MDD, mixed obsessional thoughts, insomnia, panic attacks, and GAD.   Describes mood today as "not too good". Reports tearfulness. Pleasant. Mood symptoms - reports depression and anxiety. Decreased irritability. Denies recent panic attacks. Decreased worry and rumination. Mood is lower. Stating "I feel like I've been struggling". Reports ongoing situational stressors - financial. Daughter staying with her on the weekends. Decreased interest and motivation. Taking medications as prescribed. Seeing therapist.  Energy levels very lower. Active, does not have a regular exercise routine.  Able to enjoy usual interests and activities. Single. Lives alone. Has 4 children. Family local. Spending time with family. Attends church. Appetite adequate.. Weight gain - 194 pounds. Diabetic. Sleeps better some nights than others. Averages 8 hours. Focus and concentration difficulties - at times. Completing tasks. Managing some aspects of household. Work for [employer] - 12 to 9. Denies SI or HI.  Denies AH or VH. Denies self harm. Denies substance use.  Would like to initiate Genesight testing before adding further medications.   GAD-7    Flowsheet Row Office Visit from 03/03/2022 in [facility] Office Visit from 11/25/2021 in [facility] Office Visit from 08/12/2021 in [facility] Office Visit from 04/01/2021 in [facility] Office Visit from 12/30/2020 in [facility]  Total GAD-7 Score 14 5 8 16 10       PHQ2-9    Flowsheet Row Office Visit from 03/03/2022 in [facility] Office Visit from 02/10/2022 in [facility] Office Visit from 11[facility]25/2021 in [facility] Office Visit from 08[facility]12/2021 in [facility] Office Visit from 04/01/2021 in [facility]  PHQ-2 Total Score 6 5 2 5 3   PHQ-9 Total Score 14 13 8 16 13       Flowsheet Row ED from 03/31/2021 in [facility] ED from 03/24/2021 in [facility] ED from 02/18/2021 in [facility]  C-SSRS RISK CATEGORY No Risk No Risk No Risk        Review of Systems:  Review of Systems  Musculoskeletal:  Negative for gait problem.  Neurological:  Negative for tremors.  Psychiatric/Behavioral:         Please refer to HPI    Medications: I have reviewed the patient's current medications.  Current Outpatient Medications  Medication Sig Dispense Refill   ALPRAZolam (XANAX) 0.5 MG tablet Take 1 tablet (0.5 mg total) by mouth 3 (three) times daily as needed for anxiety. 90 tablet 2   atorvastatin (LIPITOR) 80 MG tablet TAKE 1 TABLET BY MOUTH EVERY DAY 90 tablet 1   Cholecalciferol (VITAMIN D3) 50 MCG (2000 UT) capsule Take 1 capsule (2,000 Units total) by mouth daily. (Patient not taking: Reported on 02/10/2022) 90 capsule 1   Continuous Blood Gluc Receiver (DEXCOM G6 RECEIVER) DEVI 1 Bag by Does not apply route in the morning, at noon, and at bedtime. 1 each 0   Continuous Blood Gluc Sensor (FREESTYLE LIBRE 2 SENSOR) MISC Use to check blood sugar TID. 1 each 2   Continuous Blood Gluc Transmit (DEXCOM G6 TRANSMITTER) MISC 1 application by Does not apply  route as directed. 1 each 3   Dulaglutide (TRULICITY) 0.75 MG/0.5ML SOPN Inject 0.75 mg into the skin once a week. 0.5 mL 2   glucose blood (ACCU-CHEK GUIDE) test strip Check blood sugars 3 times a day before meals 300 strip 11   hydrochlorothiazide (HYDRODIURIL) 25 MG tablet Take 1 tablet (25 mg total) by mouth daily. 90 tablet 1   hydrOXYzine (VISTARIL) 50 MG  capsule Take 1 capsule (50 mg total) by mouth 3 (three) times daily as needed. 30 capsule 2   Insulin Glargine (BASAGLAR KWIKPEN) 100 UNIT/ML Inject 20 Units into the skin daily. 3 mL 3   Insulin Pen Needle (PEN NEEDLES) 32G X 5 MM MISC Use to inject Lantus (insulin) once daily. 100 each 6   LINZESS 145 MCG CAPS capsule Take 1 capsule (145 mcg total) by mouth daily as needed (constipation). 90 capsule 1   lisinopril (ZESTRIL) 20 MG tablet Take 1 tablet (20 mg total) by mouth daily. 90 tablet 1   metoprolol tartrate (LOPRESSOR) 25 MG tablet TAKE 1 TABLET BY MOUTH TWICE A DAY 180 tablet 1   norethindrone (ORTHO MICRONOR) 0.35 MG tablet Take 1 tablet (0.35 mg total) by mouth daily. 84 tablet 1   NOVOLOG 100 UNIT/ML injection Inject 12 Units into the skin 3 (three) times daily with meals. For blood sugars 0-150 give 0 units of insulin, 151-200 give 2 units of insulin, 201-250 give 4 units, 251-300 give 6 units, 301-350 give 8 units, 351-400 give 10 units,> 400 give 12 units and call M.D. Discussed hypoglycemia protocol.     omeprazole (PRILOSEC) 20 MG capsule TAKE 1 TABLET AS DAILY IF NEEDED FOR GASTRIC REFLUX 90 capsule 0   ondansetron (ZOFRAN-ODT) 4 MG disintegrating tablet Take 1 tablet (4 mg total) by mouth every 8 (eight) hours as needed for nausea or vomiting. 30 tablet 1   No current facility-administered medications for this visit.    Medication Side Effects: None  Allergies: No Known Allergies  Past Medical History:  Diagnosis Date   Anxiety    Diabetes mellitus without complication (HCC)    Gastroparesis    Hypertension    Major depressive disorder     Past Medical History, Surgical history, Social history, and Family history were reviewed and updated as appropriate.   Please see review of systems for further details on the patient's review from today.   Objective:   Physical Exam:  There were no vitals taken for this visit.  Physical Exam Constitutional:      General:  She is not in acute distress. Musculoskeletal:        General: No deformity.  Neurological:     Mental Status: She is alert and oriented to person, place, and time.     Coordination: Coordination normal.  Psychiatric:        Attention and Perception: Attention and perception normal. She does not perceive auditory or visual hallucinations.        Mood and Affect: Mood normal. Mood is not anxious or depressed. Affect is not labile, blunt, angry or inappropriate.        Speech: Speech normal.        Behavior: Behavior normal.        Thought Content: Thought content normal. Thought content is not paranoid or delusional. Thought content does not include homicidal or suicidal ideation. Thought content does not include homicidal or suicidal plan.        Cognition and Memory: Cognition and memory normal.  Judgment: Judgment normal.     Comments: Insight intact     Lab Review:     Component Value Date/Time   NA 133 (L) 03/31/2021 1125   K 3.6 03/31/2021 1125   CL 93 (L) 03/31/2021 0940   CO2 22 03/31/2021 0940   GLUCOSE 421 (H) 03/31/2021 0940   BUN 21 (H) 03/31/2021 0940   CREATININE 0.89 03/31/2021 0940   CALCIUM 9.9 03/31/2021 0940   PROT 8.5 (H) 02/18/2021 0831   ALBUMIN 4.5 02/18/2021 0831   AST 28 02/18/2021 0831   ALT 42 02/18/2021 0831   ALKPHOS 78 02/18/2021 0831   BILITOT 0.8 02/18/2021 0831   GFRNONAA >60 03/31/2021 0940   GFRAA >60 10/10/2019 1441       Component Value Date/Time   WBC 15.3 (H) 03/31/2021 0940   RBC 4.78 03/31/2021 0940   HGB 15.0 03/31/2021 1125   HCT 44.0 03/31/2021 1125   PLT 291 03/31/2021 0940   MCV 88.5 03/31/2021 0940   MCH 31.0 03/31/2021 0940   MCHC 35.0 03/31/2021 0940   RDW 12.2 03/31/2021 0940   LYMPHSABS 1.6 03/31/2021 0940   MONOABS 1.0 03/31/2021 0940   EOSABS 0.1 03/31/2021 0940   BASOSABS 0.0 03/31/2021 0940    No results found for: "POCLITH", "LITHIUM"   No results found for: "PHENYTOIN", "PHENOBARB", "VALPROATE",  "CBMZ"   .res Assessment: Plan:    Plan:  PDMP reviewed  Hydroxyzine 50mg  at hs Xanax 0.5mg  to TID for severe anxiety  Initiate Gene sight.   Requesting ADA - 5 days a month - paperwork to be faxed.  RTC 3 months  Patient weeks advised to contact office with any questions, adverse effects, or acute worsening in signs and symptoms.  Discussed potential benefits, risk, and side effects of benzodiazepines to include potential risk of tolerance and dependence, as well as possible drowsiness. Advised patient not to drive if experiencing drowsiness and to take lowest possible effective dose to minimize risk of dependence and tolerance.  Diagnoses and all orders for this visit:  Major depressive disorder, recurrent episode, moderate (HCC)  Panic attacks  Generalized anxiety disorder  Insomnia, unspecified type  Mixed obsessional thoughts and acts     Please see After Visit Summary for patient specific instructions.  Future Appointments  Date Time Provider Department Center  04/09/2022  9:30 AM 04/11/2022, Lois Huxley, RPH-CPP [facility] None  06/09/2022 11:10 AM 06/11/2022, NP [facility] None    No orders of the defined types were placed in this encounter.   -------------------------------

## 2022-03-04 ENCOUNTER — Telehealth: Payer: Self-pay | Admitting: Adult Health

## 2022-03-04 ENCOUNTER — Other Ambulatory Visit: Payer: Self-pay | Admitting: Adult Health

## 2022-03-04 DIAGNOSIS — F410 Panic disorder [episodic paroxysmal anxiety] without agoraphobia: Secondary | ICD-10-CM

## 2022-03-04 NOTE — Telephone Encounter (Signed)
Received Employee Disability Accomodation Request Form. Given to [facility] 11/9

## 2022-03-05 LAB — COMPREHENSIVE METABOLIC PANEL
ALT: 59 IU/L — ABNORMAL HIGH (ref 0–32)
AST: 62 IU/L — ABNORMAL HIGH (ref 0–40)
Albumin/Globulin Ratio: 1.5 (ref 1.2–2.2)
Albumin: 4.7 g/dL (ref 3.9–4.9)
Alkaline Phosphatase: 110 IU/L (ref 44–121)
BUN/Creatinine Ratio: 11 (ref 9–23)
BUN: 8 mg/dL (ref 6–24)
Bilirubin Total: 0.3 mg/dL (ref 0.0–1.2)
CO2: 23 mmol/L (ref 20–29)
Calcium: 9.5 mg/dL (ref 8.7–10.2)
Chloride: 98 mmol/L (ref 96–106)
Creatinine, Ser: 0.75 mg/dL (ref 0.57–1.00)
Globulin, Total: 3.1 g/dL (ref 1.5–4.5)
Glucose: 132 mg/dL — ABNORMAL HIGH (ref 70–99)
Potassium: 3.6 mmol/L (ref 3.5–5.2)
Sodium: 137 mmol/L (ref 134–144)
Total Protein: 7.8 g/dL (ref 6.0–8.5)
eGFR: 101 mL/min/{1.73_m2} (ref >59)

## 2022-03-05 LAB — LIPID PANEL
Chol/HDL Ratio: 3.9 ratio (ref 0.0–4.4)
Cholesterol, Total: 104 mg/dL (ref 100–199)
HDL: 27 mg/dL — ABNORMAL LOW (ref >39)
LDL Chol Calc (NIH): 47 mg/dL (ref 0–99)
Triglycerides: 182 mg/dL — ABNORMAL HIGH (ref 0–149)
VLDL Cholesterol Cal: 30 mg/dL (ref 5–40)

## 2022-03-05 LAB — MICROALBUMIN / CREATININE URINE RATIO
Creatinine, Urine: 144.1 mg/dL
Microalb/Creat Ratio: 27 mg/g creat (ref 0–29)
Microalbumin, Urine: 39.1 ug/mL

## 2022-03-05 LAB — VITAMIN D 25 HYDROXY (VIT D DEFICIENCY, FRACTURES): Vit D, 25-Hydroxy: 32 ng/mL (ref 30.0–100.0)

## 2022-03-05 NOTE — Telephone Encounter (Signed)
Filled 10/8 appt 12/6

## 2022-03-08 ENCOUNTER — Telehealth: Payer: Self-pay | Admitting: Adult Health

## 2022-03-08 NOTE — Telephone Encounter (Signed)
Pt lvm a message for traci. She wants to talk to her to let her know she needs 5 days off a month. On her paperwork. She also has a different fax number to give her to send it. Please call her at [phone]

## 2022-03-08 NOTE — Telephone Encounter (Signed)
Noted. Thanks I am working on hers now.

## 2022-03-10 DIAGNOSIS — Z0289 Encounter for other administrative examinations: Secondary | ICD-10-CM

## 2022-03-10 NOTE — Telephone Encounter (Signed)
Forms completed and given to [facility] to review and sign

## 2022-03-11 ENCOUNTER — Telehealth: Payer: Self-pay | Admitting: Adult Health

## 2022-03-11 NOTE — Telephone Encounter (Signed)
Tried faxing with # left, it was busy so I will try again.

## 2022-03-11 NOTE — Telephone Encounter (Signed)
Noted we can resend there

## 2022-03-11 NOTE — Telephone Encounter (Signed)
Please review

## 2022-03-11 NOTE — Telephone Encounter (Signed)
Pt called to give new fax # to send forms when completed. Attn: [facility] ADA TEAM [phone] Form needs to read 5 days per month. Contact pt @ [phone] if questions

## 2022-03-12 NOTE — Telephone Encounter (Signed)
Tried faxing forms so far 3 times and keeps coming back busy.

## 2022-03-15 ENCOUNTER — Other Ambulatory Visit (INDEPENDENT_AMBULATORY_CARE_PROVIDER_SITE_OTHER): Payer: Self-pay | Admitting: Primary Care

## 2022-03-15 DIAGNOSIS — R112 Nausea with vomiting, unspecified: Secondary | ICD-10-CM

## 2022-03-15 NOTE — Telephone Encounter (Signed)
Thank you. Will refax.

## 2022-03-15 NOTE — Telephone Encounter (Signed)
Pt LVM 11/17 @ 5:12 pm stating HR did not receive first 3 pages of paperwork. Please send again. [phone] Pt #

## 2022-03-16 NOTE — Telephone Encounter (Signed)
Requested medication (s) are due for refill today - yes  Requested medication (s) are on the active medication list -yes  Future visit scheduled -yes  Last refill: 12/01/21 #30 1RF  Notes to clinic: non delegated Rx  Requested Prescriptions  Pending Prescriptions Disp Refills   ondansetron (ZOFRAN-ODT) 4 MG disintegrating tablet [Pharmacy Med Name: ONDANSETRON ODT 4 MG TABLET] 18 tablet 3    Sig: TAKE 1 TABLET BY MOUTH EVERY 8 HOURS AS NEEDED FOR NAUSEA AND VOMITING     Not Delegated - Gastroenterology: Antiemetics - ondansetron Failed - 03/15/2022  6:53 PM      Failed - This refill cannot be delegated      Failed - AST in normal range and within 360 days    AST  Date Value Ref Range Status  03/03/2022 62 (H) 0 - 40 IU/L Final         Failed - ALT in normal range and within 360 days    ALT  Date Value Ref Range Status  03/03/2022 59 (H) 0 - 32 IU/L Final         Passed - Valid encounter within last 6 months    Recent Outpatient Visits           1 week ago Type 2 diabetes mellitus without complication, without long-term current use of insulin ([place])   [place] [facility] Kerin Perna, NP   3 months ago Type 2 diabetes mellitus without complication, without long-term current use of insulin ([place])   [place] Kerin Perna, NP   7 months ago Type 2 diabetes mellitus without complication, without long-term current use of insulin ([place])   [place] Juluis Mire P, NP   11 months ago Type 2 diabetes mellitus without complication, without long-term current use of insulin ([place])   [place] [facility] Kerin Perna, NP   1 year ago Type 2 diabetes mellitus without complication, with long-term current use of insulin ([place])   [place], Stephen L, RPH-CPP       Future Appointments             In 3 weeks Tresa Endo, [place]   In 2 months Oletta Lamas, Milford Cage, NP [facility]               Requested Prescriptions  Pending Prescriptions Disp Refills   ondansetron (ZOFRAN-ODT) 4 MG disintegrating tablet [Pharmacy Med Name: ONDANSETRON ODT 4 MG TABLET] 18 tablet 3    Sig: TAKE 1 TABLET BY MOUTH EVERY 8 HOURS AS NEEDED FOR NAUSEA AND VOMITING     Not Delegated - Gastroenterology: Antiemetics - ondansetron Failed - 03/15/2022  6:53 PM      Failed - This refill cannot be delegated      Failed - AST in normal range and within 360 days    AST  Date Value Ref Range Status  03/03/2022 62 (H) 0 - 40 IU/L Final         Failed - ALT in normal range and within 360 days    ALT  Date Value Ref Range Status  03/03/2022 59 (H) 0 - 32 IU/L Final         Passed - Valid encounter within last 6 months    Recent Outpatient Visits  1 week ago Type 2 diabetes mellitus without complication, without long-term current use of insulin (HCC)   [place] [facility] Gwinda Passe P, NP   3 months ago Type 2 diabetes mellitus without complication, without long-term current use of insulin (HCC)   [facility] Gwinda Passe P, NP   7 months ago Type 2 diabetes mellitus without complication, without long-term current use of insulin (HCC)   [facility] Gwinda Passe P, NP   11 months ago Type 2 diabetes mellitus without complication, without long-term current use of insulin (HCC)   [facility] Grayce Sessions, NP   1 year ago Type 2 diabetes mellitus without complication, with long-term current use of insulin [facility])   [employer] [facility] Lois Huxley, Cornelius Moras, RPH-CPP       Future Appointments             In 3 weeks Drucilla Chalet, RPH-CPP  [facility]   In 2 months Randa Evens, Kinnie Scales, NP [facility]  [facility]

## 2022-03-21 ENCOUNTER — Other Ambulatory Visit (INDEPENDENT_AMBULATORY_CARE_PROVIDER_SITE_OTHER): Payer: Self-pay | Admitting: Primary Care

## 2022-03-21 DIAGNOSIS — R112 Nausea with vomiting, unspecified: Secondary | ICD-10-CM

## 2022-03-22 ENCOUNTER — Other Ambulatory Visit (INDEPENDENT_AMBULATORY_CARE_PROVIDER_SITE_OTHER): Payer: Self-pay | Admitting: Primary Care

## 2022-03-22 NOTE — Telephone Encounter (Signed)
Will forward to provider  Pt is requesting diflucan

## 2022-03-23 NOTE — Telephone Encounter (Signed)
Pt called back to check status of request, says she is in great need for the diflucan.

## 2022-03-24 ENCOUNTER — Telehealth: Payer: BC Managed Care – PPO | Admitting: Physician Assistant

## 2022-03-24 DIAGNOSIS — B3731 Acute candidiasis of vulva and vagina: Secondary | ICD-10-CM | POA: Diagnosis not present

## 2022-03-24 MED ORDER — FLUCONAZOLE 200 MG PO TABS: 200.0000 mg | ORAL_TABLET | ORAL | 0 refills | Status: DC | PRN

## 2022-03-24 NOTE — Progress Notes (Signed)
E-Visit for Vaginal Symptoms  We are sorry that you are not feeling well. Here is how we plan to help! Based on what you shared with me it looks like you: May have a yeast vaginosis  Vaginosis is an inflammation of the vagina that can result in discharge, itching and pain. The cause is usually a change in the normal balance of vaginal bacteria or an infection. Vaginosis can also result from reduced estrogen levels after menopause.  The most common causes of vaginosis are:   Bacterial vaginosis which results from an overgrowth of one on several organisms that are normally present in your vagina.   Yeast infections which are caused by a naturally occurring fungus called candida.   Vaginal atrophy (atrophic vaginosis) which results from the thinning of the vagina from reduced estrogen levels after menopause.   Trichomoniasis which is caused by a parasite and is commonly transmitted by sexual intercourse.  Factors that increase your risk of developing vaginosis include: Medications, such as antibiotics and steroids Uncontrolled diabetes Use of hygiene products such as bubble bath, vaginal spray or vaginal deodorant Douching Wearing damp or tight-fitting clothing Using an intrauterine device (IUD) for birth control Hormonal changes, such as those associated with pregnancy, birth control pills or menopause Sexual activity Having a sexually transmitted infection  Your treatment plan is A single Diflucan (fluconazole) 200mg  (the standard 150mg  tablet has been on backorder) tablet once, with one refill to be used in 3 days if symptoms persist.  I have electronically sent this prescription into the pharmacy that you have chosen.  Be sure to take all of the medication as directed. Stop taking any medication if you develop a rash, tongue swelling or shortness of breath. Mothers who are breast feeding should consider pumping and discarding their breast milk while on these antibiotics. However, there  is no consensus that infant exposure at these doses would be harmful.  Remember that medication creams can weaken latex condoms.   HOME CARE:  Good hygiene may prevent some types of vaginosis from recurring and may relieve some symptoms:  Avoid baths, hot tubs and whirlpool spas. Rinse soap from your outer genital area after a shower, and dry the area well to prevent irritation. Don't use scented or harsh soaps, such as those with deodorant or antibacterial action. Avoid irritants. These include scented tampons and pads. Wipe from front to back after using the toilet. Doing so avoids spreading fecal bacteria to your vagina.  Other things that may help prevent vaginosis include:  Don't douche. Your vagina doesn't require cleansing other than normal bathing. Repetitive douching disrupts the normal organisms that reside in the vagina and can actually increase your risk of vaginal infection. Douching won't clear up a vaginal infection. Use a latex condom. Both female and female latex condoms may help you avoid infections spread by sexual contact. Wear cotton underwear. Also wear pantyhose with a cotton crotch. If you feel comfortable without it, skip wearing underwear to bed. Yeast thrives in Your symptoms should improve in the next day or two.  GET HELP RIGHT AWAY IF:  You have pain in your lower abdomen ( pelvic area or over your ovaries) You develop nausea or vomiting You develop a fever Your discharge changes or worsens You have persistent pain with intercourse You develop shortness of breath, a rapid pulse, or you faint.  These symptoms could be signs of problems or infections that need to be evaluated by a medical provider now.  MAKE  SURE YOU   Understand these instructions. Will watch your condition. Will get help right away if you are not doing well or get worse.  Thank you for choosing an e-visit.  Your e-visit answers were reviewed by a board certified  advanced clinical practitioner to complete your personal care plan. Depending upon the condition, your plan could have included both over the counter or prescription medications.  Please review your pharmacy choice. Make sure the pharmacy is open so you can pick up prescription now. If there is a problem, you may contact your provider through [employer] and have the prescription routed to another pharmacy.  Your safety is important to [place]. If you have drug allergies check your prescription carefully.   For the next 24 hours you can use MyChart to ask questions about today's visit, request a non-urgent call back, or ask for a work or school excuse. You will get an email in the next two days asking about your experience. I hope that your e-visit has been valuable and will speed your recovery.  I have spent 5 minutes in review of e-visit questionnaire, review and updating patient chart, medical decision making and response to patient.   Margaretann Loveless, PA-C

## 2022-03-31 ENCOUNTER — Encounter: Payer: Self-pay | Admitting: Adult Health

## 2022-03-31 ENCOUNTER — Ambulatory Visit (INDEPENDENT_AMBULATORY_CARE_PROVIDER_SITE_OTHER): Payer: BC Managed Care – PPO | Admitting: Adult Health

## 2022-03-31 DIAGNOSIS — F422 Mixed obsessional thoughts and acts: Secondary | ICD-10-CM

## 2022-03-31 DIAGNOSIS — G4700 Insomnia, unspecified: Secondary | ICD-10-CM

## 2022-03-31 DIAGNOSIS — F411 Generalized anxiety disorder: Secondary | ICD-10-CM

## 2022-03-31 DIAGNOSIS — F331 Major depressive disorder, recurrent, moderate: Secondary | ICD-10-CM

## 2022-03-31 DIAGNOSIS — F410 Panic disorder [episodic paroxysmal anxiety] without agoraphobia: Secondary | ICD-10-CM | POA: Diagnosis not present

## 2022-03-31 MED ORDER — SERTRALINE HCL 50 MG PO TABS: ORAL_TABLET | ORAL | 2 refills | Status: DC

## 2022-03-31 MED ORDER — ALPRAZOLAM 0.5 MG PO TABS: 0.5000 mg | ORAL_TABLET | Freq: Three times a day (TID) | ORAL | 2 refills | Status: DC | PRN

## 2022-03-31 NOTE — Progress Notes (Signed)
Gwendolyn Carrillo:[id number] [birth year] 46 y.o.  Subjective:   Patient ID:  Gwendolyn Carrillo is a 46 y.o. (DOB 12-08-1975) female.  Chief Complaint: No chief complaint on file.   HPI Gwendolyn Carrillo presents to the office today for follow-up of MDD, mixed obsessional thoughts, insomnia, panic attacks, and GAD.   Describes mood today as "not too good". Reports tearfulness. Pleasant. Mood symptoms - reports depression and anxiety - "mostly every day, but not as intense". Denies irritability. Denies recent panic attacks - "had one experience". Decreased worry and rumination. Mood has improved - "desire of a future has improved". Stating "I'm feeling better than I have in a long time". Started a non-estrogen birth control daily - feels like it has helped with hormonal regulation. Attending church regularly - "that has been helpful". Has family support. Improved interest and motivation. Taking medications as prescribed. Seeing therapist.  Energy levels lower. Active, does not have a regular exercise routine.  Able to enjoy usual interests and activities. Single. Lives alone. Has 4 children. Family local. Spending time with family. Attends church. Appetite adequate. Weight gain - 194 pounds. Diabetic. Sleeps well most nights. Averages 8 hours. Focus and concentration difficulties - at times. Completing tasks. Managing some aspects of household. Work for [employer] - 12 to 9. Denies SI or HI.  Denies AH or VH. Denies self harm. Denies substance use.   [place] Office Visit from 03/03/2022 in [place] Office Visit from 11/25/2021 in [place] Office Visit from 08/12/2021 in [place] Office Visit from 04/01/2021 in [place] Office Visit from 12/30/2020 in [place]  Total GAD-7 Score 14 5 8 16 10       PHQ2-9    [place] Office Visit from 03/03/2022  in [place] Office Visit from 02/10/2022 in [place] Visit from 11/25/2021 in [place] Office Visit from 08/12/2021 in [place] Office Visit from 04/01/2021 in [place]  PHQ-2 Total Score 6 5 2 5 3   PHQ-9 Total Score 14 13 8 16 13       Flowsheet Row ED from 03/31/2021 in [facility] ED from 03/24/2021 in [facility] ED from 02/18/2021 in [facility]  C-SSRS RISK CATEGORY No Risk No Risk No Risk        Review of Systems:  Review of Systems  Musculoskeletal:  Negative for gait problem.  Neurological:  Negative for tremors.  Psychiatric/Behavioral:         Please refer to HPI    Medications: I have reviewed the patient's current medications.  Current Outpatient Medications  Medication Sig Dispense Refill   sertraline (ZOLOFT) 50 MG tablet Take 1/2 tablet daily for 7 days, then increase to one tablet daily. 30 tablet 2   ALPRAZolam (XANAX) 0.5 MG tablet Take 1 tablet (0.5 mg total) by mouth 3 (three) times daily as needed for anxiety. 90 tablet 2   atorvastatin (LIPITOR) 80 MG tablet TAKE 1 TABLET BY MOUTH EVERY DAY 90 tablet 1   Cholecalciferol (VITAMIN D3) 50 MCG (2000 UT) capsule Take 1 capsule (2,000 Units total) by mouth daily. (Patient not taking: Reported on 02/10/2022) 90 capsule 1   Continuous Blood Gluc Receiver (DEXCOM G6 RECEIVER) DEVI 1 Bag by Does not apply route in the  morning, at noon, and at bedtime. 1 each 0   Continuous Blood Gluc Sensor (DEXCOM G6 SENSOR) MISC USE AS DIRECTED EVERY 10 DAYS 9 each 2   Continuous Blood Gluc Transmit (DEXCOM G6 TRANSMITTER) MISC USE AS DIRECTED OVER 90 DAYS 1 each 2   Dulaglutide (TRULICITY) A999333 [id number] SOPN Inject 0.75 mg into the skin once a week. 0.5 mL 2   fluconazole (DIFLUCAN) 200 MG tablet Take 1 tablet (200 mg total) by  mouth every 3 (three) days as needed. 2 tablet 0   glucose blood (ACCU-CHEK GUIDE) test strip Check blood sugars 3 times a day before meals 300 strip 11   hydrochlorothiazide (HYDRODIURIL) 25 MG tablet Take 1 tablet (25 mg total) by mouth daily. 90 tablet 1   hydrOXYzine (VISTARIL) 50 MG capsule Take 1 capsule (50 mg total) by mouth 3 (three) times daily as needed. 30 capsule 2   Insulin Glargine (BASAGLAR KWIKPEN) 100 UNIT/ML Inject 20 Units into the skin daily. 3 mL 3   Insulin Pen Needle (PEN NEEDLES) 32G X 5 MM MISC Use to inject Lantus (insulin) once daily. 100 each 6   LINZESS 145 MCG CAPS capsule Take 1 capsule (145 mcg total) by mouth daily as needed (constipation). 90 capsule 1   lisinopril (ZESTRIL) 20 MG tablet Take 1 tablet (20 mg total) by mouth daily. 90 tablet 1   metoprolol tartrate (LOPRESSOR) 25 MG tablet TAKE 1 TABLET BY MOUTH TWICE A DAY 180 tablet 1   norethindrone (ORTHO MICRONOR) 0.35 MG tablet Take 1 tablet (0.35 mg total) by mouth daily. 84 tablet 1   NOVOLOG 100 UNIT/ML injection Inject 12 Units into the skin 3 (three) times daily with meals. For blood sugars 0-150 give 0 units of insulin, 151-200 give 2 units of insulin, 201-250 give 4 units, 251-300 give 6 units, 301-350 give 8 units, 351-400 give 10 units,> 400 give 12 units and call M.D. Discussed hypoglycemia protocol.     omeprazole (PRILOSEC) 20 MG capsule TAKE 1 TABLET AS DAILY IF NEEDED FOR GASTRIC REFLUX 90 capsule 0   ondansetron (ZOFRAN-ODT) 4 MG disintegrating tablet TAKE 1 TABLET BY MOUTH EVERY 8 HOURS AS NEEDED FOR NAUSEA AND VOMITING 18 tablet 3   No current facility-administered medications for this visit.    Medication Side Effects: None  Allergies: No Known Allergies  Past Medical History:  Diagnosis Date   Anxiety    Diabetes mellitus without complication ([place])    Gastroparesis    Hypertension    Major depressive disorder     Past Medical History, Surgical history, Social history, and  Family history were reviewed and updated as appropriate.   Please see review of systems for further details on the patient's review from today.   Objective:   Physical Exam:  There were no vitals taken for this visit.  Physical Exam Constitutional:      General: She is not in acute distress. Musculoskeletal:        General: No deformity.  Neurological:     Mental Status: She is alert and oriented to person, place, and time.     Coordination: Coordination normal.  Psychiatric:        Attention and Perception: Attention and perception normal. She does not perceive auditory or visual hallucinations.        Mood and Affect: Mood normal. Mood is not anxious or depressed. Affect is not labile, blunt, angry or inappropriate.        Speech: Speech normal.  Behavior: Behavior normal.        Thought Content: Thought content normal. Thought content is not paranoid or delusional. Thought content does not include homicidal or suicidal ideation. Thought content does not include homicidal or suicidal plan.        Cognition and Memory: Cognition and memory normal.        Judgment: Judgment normal.     Comments: Insight intact     Lab Review:     Component Value Date/Time   NA 137 03/03/2022 1128   K 3.6 03/03/2022 1128   CL 98 03/03/2022 1128   CO2 23 03/03/2022 1128   GLUCOSE 132 (H) 03/03/2022 1128   GLUCOSE 421 (H) 03/31/2021 0940   BUN 8 03/03/2022 1128   CREATININE 0.75 03/03/2022 1128   CALCIUM 9.5 03/03/2022 1128   PROT 7.8 03/03/2022 1128   ALBUMIN 4.7 03/03/2022 1128   AST 62 (H) 03/03/2022 1128   ALT 59 (H) 03/03/2022 1128   ALKPHOS 110 03/03/2022 1128   BILITOT 0.3 03/03/2022 1128   GFRNONAA >60 03/31/2021 0940   GFRAA >60 10/10/2019 1441       Component Value Date/Time   WBC 15.3 (H) 03/31/2021 0940   RBC 4.78 03/31/2021 0940   HGB 15.0 03/31/2021 1125   HCT 44.0 03/31/2021 1125   PLT 291 03/31/2021 0940   MCV 88.5 03/31/2021 0940   MCH 31.0 03/31/2021  0940   MCHC 35.0 03/31/2021 0940   RDW 12.2 03/31/2021 0940   LYMPHSABS 1.6 03/31/2021 0940   MONOABS 1.0 03/31/2021 0940   EOSABS 0.1 03/31/2021 0940   BASOSABS 0.0 03/31/2021 0940    No results found for: "POCLITH", "LITHIUM"   No results found for: "PHENYTOIN", "PHENOBARB", "VALPROATE", "CBMZ"   .res Assessment: Plan:    Plan:  PDMP reviewed  Add Zoloft 50mg  - take 1/2 tablet daily x 7 days, then increase to one tablet daily.  Hydroxyzine 50mg  at hs - using occasionally Xanax 0.5mg  to TID for severe anxiety  Gene sight testing reviewed   RTC 3 months  Patient weeks advised to contact office with any questions, adverse effects, or acute worsening in signs and symptoms.  Discussed potential benefits, risk, and side effects of benzodiazepines to include potential risk of tolerance and dependence, as well as possible drowsiness. Advised patient not to drive if experiencing drowsiness and to take lowest possible effective dose to minimize risk of dependence and tolerance.  Diagnoses and all orders for this visit:  Major depressive disorder, recurrent episode, moderate (HCC) -     sertraline (ZOLOFT) 50 MG tablet; Take 1/2 tablet daily for 7 days, then increase to one tablet daily.  Generalized anxiety disorder -     sertraline (ZOLOFT) 50 MG tablet; Take 1/2 tablet daily for 7 days, then increase to one tablet daily.  Mixed obsessional thoughts and acts -     sertraline (ZOLOFT) 50 MG tablet; Take 1/2 tablet daily for 7 days, then increase to one tablet daily.  Panic attacks -     ALPRAZolam (XANAX) 0.5 MG tablet; Take 1 tablet (0.5 mg total) by mouth 3 (three) times daily as needed for anxiety.  Insomnia, unspecified type -     ALPRAZolam (XANAX) 0.5 MG tablet; Take 1 tablet (0.5 mg total) by mouth 3 (three) times daily as needed for anxiety.     Please see After Visit Summary for patient specific instructions.  Future Appointments  Date Time Provider Department  Center  04/09/2022  9:30 AM  Delena Serve, RPH-CPP [facility] None  05/12/2022 10:30 AM [place], Raymond J, MD [facility]  06/09/2022 11:10 AM Kerin Perna, NP [facility] None    No orders of the defined types were placed in this encounter.   -------------------------------

## 2022-04-09 ENCOUNTER — Ambulatory Visit: Payer: BC Managed Care – PPO | Admitting: Pharmacist

## 2022-04-13 ENCOUNTER — Telehealth: Payer: Self-pay | Admitting: Adult Health

## 2022-04-13 NOTE — Telephone Encounter (Signed)
Received FMLA paperwork. Given to [facility] 12/19

## 2022-04-24 ENCOUNTER — Other Ambulatory Visit: Payer: Self-pay | Admitting: Adult Health

## 2022-04-24 DIAGNOSIS — Z0289 Encounter for other administrative examinations: Secondary | ICD-10-CM

## 2022-04-24 DIAGNOSIS — F411 Generalized anxiety disorder: Secondary | ICD-10-CM

## 2022-04-24 DIAGNOSIS — F331 Major depressive disorder, recurrent, moderate: Secondary | ICD-10-CM

## 2022-04-24 DIAGNOSIS — F422 Mixed obsessional thoughts and acts: Secondary | ICD-10-CM

## 2022-04-24 NOTE — Telephone Encounter (Signed)
FMLA completed, signed, and faxed per [employer] request.

## 2022-04-26 ENCOUNTER — Other Ambulatory Visit: Payer: Self-pay | Admitting: Adult Health

## 2022-04-26 DIAGNOSIS — F411 Generalized anxiety disorder: Secondary | ICD-10-CM

## 2022-04-30 ENCOUNTER — Other Ambulatory Visit (INDEPENDENT_AMBULATORY_CARE_PROVIDER_SITE_OTHER): Payer: Self-pay | Admitting: Primary Care

## 2022-04-30 NOTE — Telephone Encounter (Signed)
Requested medication (s) are due for refill today: routing for review  Requested medication (s) are on the active medication list: yes  Last refill:  02/10/22  Future visit scheduled: yes  Notes to clinic:  Unable to refill per protocol, last refill by another provider.      Requested Prescriptions  Pending Prescriptions Disp Refills   NOVOLOG 100 UNIT/ML injection [Pharmacy Med Name: NOVOLOG 100 UNIT/ML VIAL] 30 mL 3    Sig: INJECT 12 UNITS INTO THE SKIN 3 TIMES A DAY BEFORE MEALS     Endocrinology:  Diabetes - Insulins Failed - 04/30/2022 12:10 AM      Failed - HBA1C is between 0 and 7.9 and within 180 days    HbA1c, POC (controlled diabetic range)  Date Value Ref Range Status  03/03/2022 8.0 (A) 0.0 - 7.0 % Final         Passed - Valid encounter within last 6 months    Recent Outpatient Visits           1 month ago Type 2 diabetes mellitus without complication, without long-term current use of insulin ([place])   [facility] Juluis Mire P, NP   5 months ago Type 2 diabetes mellitus without complication, without long-term current use of insulin ([place])   [place] Juluis Mire P, NP   8 months ago Type 2 diabetes mellitus without complication, without long-term current use of insulin ([place])   [place] Juluis Mire P, NP   1 year ago Type 2 diabetes mellitus without complication, without long-term current use of insulin ([place])   [facility] Kerin Perna, NP   1 year ago Type 2 diabetes mellitus without complication, with long-term current use of insulin ([place])   [place], Jarome Matin, RPH-CPP       Future Appointments             In 1 week [place], Blondell Reveal, MD [facility]   In 1 month Oletta Lamas, Milford Cage, NP [place]

## 2022-05-02 ENCOUNTER — Other Ambulatory Visit (INDEPENDENT_AMBULATORY_CARE_PROVIDER_SITE_OTHER): Payer: Self-pay | Admitting: Primary Care

## 2022-05-05 ENCOUNTER — Encounter (HOSPITAL_BASED_OUTPATIENT_CLINIC_OR_DEPARTMENT_OTHER): Payer: Self-pay

## 2022-05-05 ENCOUNTER — Emergency Department (HOSPITAL_BASED_OUTPATIENT_CLINIC_OR_DEPARTMENT_OTHER)
Admission: EM | Admit: 2022-05-05 | Discharge: 2022-05-05 | Disposition: A | Discharge status: Home / Self Care | Payer: BC Managed Care – PPO | Attending: Emergency Medicine | Admitting: Emergency Medicine

## 2022-05-05 ENCOUNTER — Emergency Department (HOSPITAL_BASED_OUTPATIENT_CLINIC_OR_DEPARTMENT_OTHER): Payer: BC Managed Care – PPO

## 2022-05-05 ENCOUNTER — Other Ambulatory Visit: Payer: Self-pay

## 2022-05-05 DIAGNOSIS — K760 Fatty (change of) liver, not elsewhere classified: Secondary | ICD-10-CM | POA: Diagnosis not present

## 2022-05-05 DIAGNOSIS — R1030 Lower abdominal pain, unspecified: Secondary | ICD-10-CM | POA: Diagnosis not present

## 2022-05-05 DIAGNOSIS — R109 Unspecified abdominal pain: Secondary | ICD-10-CM

## 2022-05-05 DIAGNOSIS — E119 Type 2 diabetes mellitus without complications: Secondary | ICD-10-CM | POA: Diagnosis not present

## 2022-05-05 DIAGNOSIS — M545 Low back pain, unspecified: Secondary | ICD-10-CM | POA: Diagnosis not present

## 2022-05-05 DIAGNOSIS — R1084 Generalized abdominal pain: Secondary | ICD-10-CM | POA: Insufficient documentation

## 2022-05-05 DIAGNOSIS — Z794 Long term (current) use of insulin: Secondary | ICD-10-CM | POA: Insufficient documentation

## 2022-05-05 LAB — CBC WITH DIFFERENTIAL/PLATELET
Abs Immature Granulocytes: 0.03 10*3/uL (ref 0.00–0.07)
Basophils Absolute: 0 10*3/uL (ref 0.0–0.1)
Basophils Relative: 0 %
Eosinophils Absolute: 0.2 10*3/uL (ref 0.0–0.5)
Eosinophils Relative: 2 %
HCT: 36.1 % (ref 36.0–46.0)
Hemoglobin: 12.4 g/dL (ref 12.0–15.0)
Immature Granulocytes: 0 %
Lymphocytes Relative: 24 %
Lymphs Abs: 2.3 10*3/uL (ref 0.7–4.0)
MCH: 32.3 pg (ref 26.0–34.0)
MCHC: 34.3 g/dL (ref 30.0–36.0)
MCV: 94 fL (ref 80.0–100.0)
Monocytes Absolute: 0.6 10*3/uL (ref 0.1–1.0)
Monocytes Relative: 6 %
Neutro Abs: 6.5 10*3/uL (ref 1.7–7.7)
Neutrophils Relative %: 68 %
Platelets: 243 10*3/uL (ref 150–400)
RBC: 3.84 MIL/uL — ABNORMAL LOW (ref 3.87–5.11)
RDW: 13 % (ref 11.5–15.5)
WBC: 9.5 10*3/uL (ref 4.0–10.5)
nRBC: 0 % (ref 0.0–0.2)

## 2022-05-05 LAB — COMPREHENSIVE METABOLIC PANEL
ALT: 61 U/L — ABNORMAL HIGH (ref 0–44)
AST: 66 U/L — ABNORMAL HIGH (ref 15–41)
Albumin: 4 g/dL (ref 3.5–5.0)
Alkaline Phosphatase: 61 U/L (ref 38–126)
Anion gap: 10 (ref 5–15)
BUN: 13 mg/dL (ref 6–20)
CO2: 25 mmol/L (ref 22–32)
Calcium: 9.1 mg/dL (ref 8.9–10.3)
Chloride: 102 mmol/L (ref 98–111)
Creatinine, Ser: 0.93 mg/dL (ref 0.44–1.00)
GFR, Estimated: >60 mL/min (ref >60)
Glucose, Bld: 210 mg/dL — ABNORMAL HIGH (ref 70–99)
Potassium: 3.7 mmol/L (ref 3.5–5.1)
Sodium: 137 mmol/L (ref 135–145)
Total Bilirubin: 0.6 mg/dL (ref 0.3–1.2)
Total Protein: 7.4 g/dL (ref 6.5–8.1)

## 2022-05-05 LAB — LIPASE, BLOOD: Lipase: 12 U/L (ref 11–51)

## 2022-05-05 MED ORDER — SODIUM CHLORIDE 0.9 % IV BOLUS: 1000.0000 mL | Freq: Once | INTRAVENOUS | Status: DC

## 2022-05-05 MED ORDER — ONDANSETRON HCL 4 MG/2ML IJ SOLN
4.0000 mg | Freq: Once | INTRAMUSCULAR | Status: DC
  Filled 2022-05-05: qty 2

## 2022-05-05 NOTE — ED Provider Notes (Signed)
[facility] EMERGENCY DEPT Provider Note   CSN: [id number] Arrival date & time: 05/05/22  1225     History  Chief Complaint  Patient presents with   Abdominal Pain    Gwendolyn Carrillo is a 47 y.o. female.  Patient here with bilateral flank pain, low back pain.  History of kidney stones, diabetes.  Denies any nausea vomiting diarrhea.  She has had ablation in the past and has also had her appendix removed.  She denies any fever, chills.  No chest pain or shortness of breath.  The history is provided by the patient.       Home Medications Prior to Admission medications   Medication Sig Start Date End Date Taking? Authorizing Provider  ALPRAZolam Duanne Moron) 0.5 MG tablet Take 1 tablet (0.5 mg total) by mouth 3 (three) times daily as needed for anxiety. 03/31/22   Mozingo, Berdie Ogren, NP  atorvastatin (LIPITOR) 80 MG tablet TAKE 1 TABLET BY MOUTH EVERY DAY 02/01/22   Kerin Perna, NP  Cholecalciferol (VITAMIN D3) 50 MCG (2000 UT) capsule Take 1 capsule (2,000 Units total) by mouth daily. Patient not taking: Reported on 02/10/2022 11/02/21   Kerin Perna, NP  Continuous Blood Gluc Receiver (DEXCOM G6 RECEIVER) DEVI 1 Bag by Does not apply route in the morning, at noon, and at bedtime. 05/22/21   Kerin Perna, NP  Continuous Blood Gluc Sensor (DEXCOM G6 SENSOR) MISC USE AS DIRECTED EVERY 10 DAYS 03/23/22   Kerin Perna, NP  Continuous Blood Gluc Transmit (DEXCOM G6 TRANSMITTER) MISC USE AS DIRECTED OVER 90 DAYS 03/23/22   Kerin Perna, NP  Dulaglutide (TRULICITY) 2.72 ZD/6.6YQ SOPN Inject 0.75 mg into the skin once a week. 03/03/22   Kerin Perna, NP  fluconazole (DIFLUCAN) 200 MG tablet Take 1 tablet (200 mg total) by mouth every 3 (three) days as needed. 03/24/22   Mar Daring, PA-C  glucose blood (ACCU-CHEK GUIDE) test strip Check blood sugars 3 times a day before meals 11/25/21   Kerin Perna, NP   hydrochlorothiazide (HYDRODIURIL) 25 MG tablet Take 1 tablet (25 mg total) by mouth daily. 11/25/21   Kerin Perna, NP  hydrOXYzine (VISTARIL) 50 MG capsule TAKE 1 CAPSULE BY MOUTH 3 TIMES DAILY AS NEEDED. 04/27/22   Mozingo, Berdie Ogren, NP  Insulin Glargine (BASAGLAR KWIKPEN) 100 UNIT/ML Inject 20 Units into the skin daily. 11/25/21   Kerin Perna, NP  Insulin Pen Needle (PEN NEEDLES) 32G X 5 MM MISC Use to inject Lantus (insulin) once daily. 03/03/22   Kerin Perna, NP  LINZESS 145 MCG CAPS capsule Take 1 capsule (145 mcg total) by mouth daily as needed (constipation). 11/25/21   Kerin Perna, NP  lisinopril (ZESTRIL) 20 MG tablet Take 1 tablet (20 mg total) by mouth daily. 11/25/21   Kerin Perna, NP  metoprolol tartrate (LOPRESSOR) 25 MG tablet TAKE 1 TABLET BY MOUTH TWICE A DAY 02/26/22   Patwardhan, Manish J, MD  norethindrone (ORTHO MICRONOR) 0.35 MG tablet Take 1 tablet (0.35 mg total) by mouth daily. 02/11/22   Tamela Gammon, NP  NOVOLOG 100 UNIT/ML injection INJECT 12 UNITS INTO THE SKIN 3 TIMES A DAY BEFORE MEALS 05/03/22   Kerin Perna, NP  omeprazole (PRILOSEC) 20 MG capsule TAKE 1 TABLET AS DAILY IF NEEDED FOR GASTRIC REFLUX 05/03/22   Kerin Perna, NP  ondansetron (ZOFRAN-ODT) 4 MG disintegrating tablet TAKE 1 TABLET BY MOUTH EVERY 8 HOURS AS  NEEDED FOR NAUSEA AND VOMITING 03/22/22   Grayce Sessions, NP  sertraline (ZOLOFT) 50 MG tablet Take 1 tablet (50 mg total) by mouth daily. 04/27/22   Mozingo, Thereasa Solo, NP      Allergies    Patient has no known allergies.    Review of Systems   Review of Systems  Physical Exam Updated Vital Signs BP (!) 152/98 (BP Location: Right Arm)   Pulse 87   Temp 98 F (36.7 C) (Oral)   Resp 18   Ht 5' 4.5" (1.638 m)   Wt 88.4 kg   SpO2 96%   BMI 32.94 kg/m  Physical Exam Vitals and nursing note reviewed.  Constitutional:      General: She is not in acute distress.    Appearance: She  is well-developed.  HENT:     Head: Normocephalic and atraumatic.  Eyes:     Conjunctiva/sclera: Conjunctivae normal.  Cardiovascular:     Rate and Rhythm: Normal rate and regular rhythm.     Heart sounds: No murmur heard. Pulmonary:     Effort: Pulmonary effort is normal. No respiratory distress.     Breath sounds: Normal breath sounds.  Abdominal:     Palpations: Abdomen is soft.     Tenderness: There is generalized abdominal tenderness.  Musculoskeletal:        General: No swelling.     Cervical back: Neck supple.  Skin:    General: Skin is warm and dry.     Capillary Refill: Capillary refill takes less than 2 seconds.  Neurological:     Mental Status: She is alert.  Psychiatric:        Mood and Affect: Mood normal.     ED Results / Procedures / Treatments   Labs (all labs ordered are listed, but only abnormal results are displayed) Labs Reviewed  CBC WITH DIFFERENTIAL/PLATELET - Abnormal; Notable for the following components:      Result Value   RBC 3.84 (*)    All other components within normal limits  COMPREHENSIVE METABOLIC PANEL - Abnormal; Notable for the following components:   Glucose, Bld 210 (*)    AST 66 (*)    ALT 61 (*)    All other components within normal limits  LIPASE, BLOOD    EKG None  Radiology CT Renal Stone Study  Result Date: 05/05/2022 CLINICAL DATA:  Lower abdominal and pelvic pain for several weeks but worsening over the last 2 days. Previous tubal ligation and endometrial ablation. Prior appendectomy EXAM: CT ABDOMEN AND PELVIS WITHOUT CONTRAST TECHNIQUE: Multidetector CT imaging of the abdomen and pelvis was performed following the standard protocol without IV contrast. RADIATION DOSE REDUCTION: This exam was performed according to the departmental dose-optimization program which includes automated exposure control, adjustment of the mA and/or kV according to patient size and/or use of iterative reconstruction technique. COMPARISON:   None Available. FINDINGS: Lower chest: Lung bases are clear.  No pleural effusion. Hepatobiliary: Diffuse fatty liver infiltration. Areas of sparing along the gallbladder fossa margin. The gallbladder itself is nondilated. Pancreas: Moderate pancreatic atrophy. No obvious mass on noncontrast imaging. Spleen: The spleen is nonenlarged. Adrenals/Urinary Tract: Adrenal glands are preserved. No abnormal calcification is seen within either kidney nor along the expected course of either ureter. The extreme distal right ureter is difficult to follow but no secondary changes of abnormality. Bladder is underdistended but has a preserved contour. Stomach/Bowel: On this non oral contrast exam the large bowel has a normal course and  caliber with scattered colonic stool. Surgical changes along the posterior aspect of the cecum consistent with history of appendectomy. There is moderate debris in the stomach. The small bowel is nondilated on this non oral contrast exam. Vascular/Lymphatic: Normal caliber aorta and IVC with some atherosclerotic changes along the aorta and branch vessels. No specific abnormal lymph node enlargement seen in the abdomen and pelvis. Reproductive: Uterus and bilateral adnexa are unremarkable. Other: No abdominal wall hernia or abnormality. No abdominopelvic ascites. Musculoskeletal: No acute or significant osseous findings. IMPRESSION: No obstructing renal stone. Previous appendectomy. No bowel obstruction, free air or free fluid. Fatty liver infiltration. Electronically Signed   By: Jill Side M.D.   On: 05/05/2022 13:13    Procedures Procedures    Medications Ordered in ED Medications - No data to display   ED Course/ Medical Decision Making/ A&P                           Medical Decision Making Amount and/or Complexity of Data Reviewed Labs: ordered. Radiology: ordered.  Risk Prescription drug management.   BELVIE IRIBE is here with abdominal pain.  Unremarkable vitals.   No fever.  Differential diagnosis is kidney stone versus bowel obstruction versus MSK related pain/viral/foodborne illness.  She overall appears well.  Some generalized abdominal tenderness on exam.  She has had prior appendectomy.  Will get CBC, CMP, lipase.  She denies any urinary symptoms.  She does not want any pain meds.  Will get a CT scan abdomen pelvis.  Per my review and interpretation labs no significant anemia or electrolyte abnormality or kidney injury.  CT scan with no acute findings per radiology report.  Overall suspect that this is referred to MSK pain.  Recommend Tylenol ibuprofen at home.  Discharged in good condition.  This chart was dictated using voice recognition software.  Despite best efforts to proofread,  errors can occur which can change the documentation meaning.         Final Clinical Impression(s) / ED Diagnoses Final diagnoses:  Abdominal pain, unspecified abdominal location    Rx / DC Orders ED Discharge Orders     None         Lennice Sites, DO 05/05/22 1418

## 2022-05-05 NOTE — ED Notes (Signed)
Fluids and meds were offered... Pt refused.Marland KitchenMarland Kitchen

## 2022-05-05 NOTE — ED Triage Notes (Signed)
Patient here POV from Home.  Endorses Generalized ABD Pain that is also associated with Bilateral Flank Pain for a few weeks.  Some Nausea at Times. No Emesis. No Diarrhea. No known Fevers.   NAD Noted during Triage. A&Ox4. GCS 15. Ambulatory.

## 2022-05-05 NOTE — ED Notes (Signed)
Discharge paperwork given and verbally understood. 

## 2022-05-05 NOTE — Discharge Instructions (Signed)
Blood work today is unremarkable.  Follow-up with your primary care doctor.  Continue Tylenol and ibuprofen for any discomfort.

## 2022-05-12 ENCOUNTER — Ambulatory Visit (HOSPITAL_BASED_OUTPATIENT_CLINIC_OR_DEPARTMENT_OTHER): Payer: BC Managed Care – PPO | Admitting: Family Medicine

## 2022-05-15 ENCOUNTER — Telehealth: Payer: BC Managed Care – PPO | Admitting: Nurse Practitioner

## 2022-05-15 DIAGNOSIS — B9689 Other specified bacterial agents as the cause of diseases classified elsewhere: Secondary | ICD-10-CM

## 2022-05-15 DIAGNOSIS — N760 Acute vaginitis: Secondary | ICD-10-CM | POA: Diagnosis not present

## 2022-05-15 DIAGNOSIS — E119 Type 2 diabetes mellitus without complications: Secondary | ICD-10-CM

## 2022-05-15 DIAGNOSIS — Z3490 Encounter for supervision of normal pregnancy, unspecified, unspecified trimester: Secondary | ICD-10-CM

## 2022-05-15 MED ORDER — CLINDAMYCIN PHOSPHATE 2 % VA CREA: 1.0000 | TOPICAL_CREAM | Freq: Every day | VAGINAL | 0 refills | Status: AC

## 2022-05-15 NOTE — Progress Notes (Signed)
Based on what you shared with me it looks like you may have BACTERIAL VAGINOSIS,that should be evaluated in a face to face office visit. Due to your current pregnancy and diabetes you need a face to face visit for proper treatment.  NOTE: There will be NO CHARGE for this eVisit   If you are having a true medical emergency please call 911.      For an urgent face to face visit, [facility] has six urgent care centers for your convenience:     [facility] [place] Get Driving Directions [phone] [place], [place] [address]    [facility]) Get Driving Directions [phone] [place], [place] [address]  [facility] [place] ([place]) Get Driving Directions [phone] [address] [place],  [place]  [address]  [facility] Get Driving Directions [phone] [address], [place], [place] [address]   [facility] Get Driving Directions  [phone] [address] [place],  [address]   [facility] [place] Get Driving Directions [phone] [address], [place],  [address]  Your MyChart E-visit questionnaire answers were reviewed by a board certified advanced clinical practitioner to complete your personal care plan based on your specific symptoms.  Thank you for using e-Visits.

## 2022-05-15 NOTE — Progress Notes (Signed)
E-Visit for Vaginal Symptoms  We are sorry that you are not feeling well. Here is how we plan to help! Based on what you shared with me it looks like you: May have a vaginosis due to bacteria  Vaginosis is an inflammation of the vagina that can result in discharge, itching and pain. The cause is usually a change in the normal balance of vaginal bacteria or an infection. Vaginosis can also result from reduced estrogen levels after menopause.  The most common causes of vaginosis are:   Bacterial vaginosis which results from an overgrowth of one on several organisms that are normally present in your vagina.   Yeast infections which are caused by a naturally occurring fungus called candida.   Vaginal atrophy (atrophic vaginosis) which results from the thinning of the vagina from reduced estrogen levels after menopause.   Trichomoniasis which is caused by a parasite and is commonly transmitted by sexual intercourse.  Factors that increase your risk of developing vaginosis include: Medications, such as antibiotics and steroids Uncontrolled diabetes Use of hygiene products such as bubble bath, vaginal spray or vaginal deodorant Douching Wearing damp or tight-fitting clothing Using an intrauterine device (IUD) for birth control Hormonal changes, such as those associated with pregnancy, birth control pills or menopause Sexual activity Having a sexually transmitted infection  Your treatment plan is Clindamycin vaginal cream 5 grams applied vaginally for 7 days.  I have electronically sent this prescription into the pharmacy that you have chosen.  Be sure to take all of the medication as directed. Stop taking any medication if you develop a rash, tongue swelling or shortness of breath. Mothers who are breast feeding should consider pumping and discarding their breast milk while on these antibiotics. However, there is no consensus that infant exposure at these doses would be harmful.  Remember  that medication creams can weaken latex condoms. Marland Kitchen   HOME CARE:  Good hygiene may prevent some types of vaginosis from recurring and may relieve some symptoms:  Avoid baths, hot tubs and whirlpool spas. Rinse soap from your outer genital area after a shower, and dry the area well to prevent irritation. Don't use scented or harsh soaps, such as those with deodorant or antibacterial action. Avoid irritants. These include scented tampons and pads. Wipe from front to back after using the toilet. Doing so avoids spreading fecal bacteria to your vagina.  Other things that may help prevent vaginosis include:  Don't douche. Your vagina doesn't require cleansing other than normal bathing. Repetitive douching disrupts the normal organisms that reside in the vagina and can actually increase your risk of vaginal infection. Douching won't clear up a vaginal infection. Use a latex condom. Both female and female latex condoms may help you avoid infections spread by sexual contact. Wear cotton underwear. Also wear pantyhose with a cotton crotch. If you feel comfortable without it, skip wearing underwear to bed. Yeast thrives in [employer] Your symptoms should improve in the next day or two.  GET HELP RIGHT AWAY IF:  You have pain in your lower abdomen ( pelvic area or over your ovaries) You develop nausea or vomiting You develop a fever Your discharge changes or worsens You have persistent pain with intercourse You develop shortness of breath, a rapid pulse, or you faint.  These symptoms could be signs of problems or infections that need to be evaluated by a medical provider now.  MAKE SURE YOU   Understand these instructions. Will watch your condition. Will get help right  away if you are not doing well or get worse.  Thank you for choosing an e-visit.  Your e-visit answers were reviewed by a board certified advanced clinical practitioner to complete your personal care plan. Depending  upon the condition, your plan could have included both over the counter or prescription medications.  Please review your pharmacy choice. Make sure the pharmacy is open so you can pick up prescription now. If there is a problem, you may contact your provider through [employer] and have the prescription routed to another pharmacy.  Your safety is important to [place]. If you have drug allergies check your prescription carefully.   For the next 24 hours you can use MyChart to ask questions about today's visit, request a non-urgent call back, or ask for a work or school excuse. You will get an email in the next two days asking about your experience. I hope that your e-visit has been valuable and will speed your recovery.  Mary-Margaret Hassell Done, FNP   5-10 minutes spent reviewing and documenting in chart.

## 2022-05-17 ENCOUNTER — Ambulatory Visit: Payer: BC Managed Care – PPO | Admitting: Nurse Practitioner

## 2022-05-28 ENCOUNTER — Other Ambulatory Visit: Payer: Self-pay | Admitting: Adult Health

## 2022-05-28 DIAGNOSIS — F411 Generalized anxiety disorder: Secondary | ICD-10-CM

## 2022-06-09 ENCOUNTER — Ambulatory Visit (INDEPENDENT_AMBULATORY_CARE_PROVIDER_SITE_OTHER): Payer: BC Managed Care – PPO | Admitting: Primary Care

## 2022-06-30 ENCOUNTER — Other Ambulatory Visit: Payer: Self-pay | Admitting: Adult Health

## 2022-06-30 ENCOUNTER — Other Ambulatory Visit (INDEPENDENT_AMBULATORY_CARE_PROVIDER_SITE_OTHER): Payer: Self-pay | Admitting: Primary Care

## 2022-06-30 DIAGNOSIS — G4700 Insomnia, unspecified: Secondary | ICD-10-CM

## 2022-06-30 DIAGNOSIS — F410 Panic disorder [episodic paroxysmal anxiety] without agoraphobia: Secondary | ICD-10-CM

## 2022-06-30 DIAGNOSIS — R112 Nausea with vomiting, unspecified: Secondary | ICD-10-CM

## 2022-06-30 NOTE — Telephone Encounter (Signed)
Requested medication (s) are due for refill today: yes  Requested medication (s) are on the active medication list: yes  Last refill:  03/22/22 #18/3  Future visit scheduled: no  Notes to clinic:  Unable to refill per protocol, cannot delegate.    Requested Prescriptions  Pending Prescriptions Disp Refills   ondansetron (ZOFRAN-ODT) 4 MG disintegrating tablet [Pharmacy Med Name: ONDANSETRON ODT 4 MG TABLET] 12 tablet 5    Sig: TAKE 1 TABLET BY MOUTH EVERY 8 HOURS AS NEEDED FOR NAUSEA AND VOMITING     Not Delegated - Gastroenterology: Antiemetics - ondansetron Failed - 06/30/2022  2:13 AM      Failed - This refill cannot be delegated      Failed - AST in normal range and within 360 days    AST  Date Value Ref Range Status  05/05/2022 66 (H) 15 - 41 U/L Final         Failed - ALT in normal range and within 360 days    ALT  Date Value Ref Range Status  05/05/2022 61 (H) 0 - 44 U/L Final         Passed - Valid encounter within last 6 months    Recent Outpatient Visits           3 months ago Type 2 diabetes mellitus without complication, without long-term current use of insulin ([place])   [place] [facility] Kerin Perna, NP   7 months ago Type 2 diabetes mellitus without complication, without long-term current use of insulin ([place])   [place] Kerin Perna, NP   10 months ago Type 2 diabetes mellitus without complication, without long-term current use of insulin ([place])   [place] Kerin Perna, NP   1 year ago Type 2 diabetes mellitus without complication, without long-term current use of insulin ([place])   [place] [facility] Kerin Perna, NP   1 year ago Type 2 diabetes mellitus without complication, with long-term current use of insulin [facility])   [place], RPH-CPP

## 2022-06-30 NOTE — Telephone Encounter (Signed)
Pt has an appointment on 03/27

## 2022-06-30 NOTE — Telephone Encounter (Signed)
Will forward to provider  

## 2022-07-01 ENCOUNTER — Other Ambulatory Visit (INDEPENDENT_AMBULATORY_CARE_PROVIDER_SITE_OTHER): Payer: Self-pay | Admitting: Primary Care

## 2022-07-01 DIAGNOSIS — E119 Type 2 diabetes mellitus without complications: Secondary | ICD-10-CM

## 2022-07-02 NOTE — Telephone Encounter (Signed)
Requested medication (s) are due for refill today: yes  Requested medication (s) are on the active medication list: yes  Last refill:  11/25/21  Future visit scheduled: yes  Notes to clinic:  Pharmacy comment: Alternative Requested:THE PRESCRIBED MEDICATION IS NOT COVERED BY INSURANCE. PLEASE CONSIDER CHANGING TO ONE OF THE SUGGESTED COVERED ALTERNATIVES OR SUBMITTING FOR A PRIOR AUTHORIZATION      Requested Prescriptions  Pending Prescriptions Disp Refills   LANTUS SOLOSTAR 100 UNIT/ML Solostar Pen [Pharmacy Med Name: LANTUS SOLOSTAR 100 UNIT/ML]  0     Endocrinology:  Diabetes - Insulins Failed - 07/01/2022 11:26 PM      Failed - HBA1C is between 0 and 7.9 and within 180 days    HbA1c, POC (controlled diabetic range)  Date Value Ref Range Status  03/03/2022 8.0 (A) 0.0 - 7.0 % Final         Passed - Valid encounter within last 6 months    Recent Outpatient Visits           4 months ago Type 2 diabetes mellitus without complication, without long-term current use of insulin ([place])   [place] [facility] Kerin Perna, NP   7 months ago Type 2 diabetes mellitus without complication, without long-term current use of insulin ([place])   [place] Kerin Perna, NP   10 months ago Type 2 diabetes mellitus without complication, without long-term current use of insulin ([place])   [facility] Kerin Perna, NP   1 year ago Type 2 diabetes mellitus without complication, without long-term current use of insulin ([place])   [place] [facility] Kerin Perna, NP   1 year ago Type 2 diabetes mellitus without complication, with long-term current use of insulin [facility])   [place], RPH-CPP

## 2022-07-02 NOTE — Telephone Encounter (Signed)
Will forward to provider  

## 2022-07-05 ENCOUNTER — Other Ambulatory Visit (INDEPENDENT_AMBULATORY_CARE_PROVIDER_SITE_OTHER): Payer: Self-pay | Admitting: Primary Care

## 2022-07-05 DIAGNOSIS — E119 Type 2 diabetes mellitus without complications: Secondary | ICD-10-CM

## 2022-07-05 NOTE — Telephone Encounter (Signed)
Changed to [employer]

## 2022-07-05 NOTE — Telephone Encounter (Signed)
Changed to [employer]

## 2022-07-05 NOTE — Telephone Encounter (Signed)
Will forward to provider  

## 2022-07-11 ENCOUNTER — Other Ambulatory Visit (INDEPENDENT_AMBULATORY_CARE_PROVIDER_SITE_OTHER): Payer: Self-pay | Admitting: Primary Care

## 2022-07-11 MED ORDER — LANTUS SOLOSTAR 100 UNIT/ML ~~LOC~~ SOPN: 15.0000 [IU] | PEN_INJECTOR | Freq: Every day | SUBCUTANEOUS | 3 refills | Status: DC

## 2022-07-21 ENCOUNTER — Telehealth: Payer: Self-pay | Admitting: Adult Health

## 2022-07-21 ENCOUNTER — Ambulatory Visit (INDEPENDENT_AMBULATORY_CARE_PROVIDER_SITE_OTHER): Payer: BC Managed Care – PPO | Admitting: Adult Health

## 2022-07-21 ENCOUNTER — Encounter: Payer: Self-pay | Admitting: Adult Health

## 2022-07-21 DIAGNOSIS — F331 Major depressive disorder, recurrent, moderate: Secondary | ICD-10-CM

## 2022-07-21 DIAGNOSIS — F411 Generalized anxiety disorder: Secondary | ICD-10-CM | POA: Diagnosis not present

## 2022-07-21 DIAGNOSIS — F410 Panic disorder [episodic paroxysmal anxiety] without agoraphobia: Secondary | ICD-10-CM | POA: Diagnosis not present

## 2022-07-21 DIAGNOSIS — G4700 Insomnia, unspecified: Secondary | ICD-10-CM

## 2022-07-21 NOTE — Telephone Encounter (Signed)
Received fax from [employer] re: Gwendolyn Carrillo. Completion needed for FMLA form. Placed in Traci's box.

## 2022-07-21 NOTE — Telephone Encounter (Signed)
Received FMLA paperwork. Given to [facility] 3/27

## 2022-07-21 NOTE — Progress Notes (Signed)
LAVILLA STEINBRUNNER [id number] 18-Apr-1976 47 y.o.  Subjective:   Patient ID:  Gwendolyn Carrillo is a 47 y.o. (DOB 16-Jul-1975) female.  Chief Complaint: No chief complaint on file.   HPI Gwendolyn Carrillo presents to the office today for follow-up of MDD, mixed obsessional thoughts, insomnia, panic attacks, and GAD.   Describes mood today as "not good". Reports increased tearfulness. Tearful throughout interview. Pleasant. Flat. Mood symptoms - reports increased depression, anxiety and irritability. Denies recent panic attacks - "more rare". Reports increased worry, rumination, and over thinking. Mood is lower. Having more bad days than good. Not wanting to get dressed or leave the house. Struggling to work. Stating "I'm not feeling good at all". Reports feeling like her hormones are off - "I feel like the Ozempic was responsible for my decline". Reports increased situational stressors - family and financial. Has been unable to work - has requested time off to get "emotionally" stable. Decreased interest and motivation. Taking medications as prescribed. Planning to restart therapy. Energy levels lower. Active, does not have a regular exercise routine.  Able to enjoy usual interests and activities. Single. Lives alone. Has 4 children. Family local. Spending time with family. Attends church. Appetite adequate. Weight stable - 195 pounds. Diabetic - A1C elevated Reports difficulties with sleep. Reports tossing and turning all night. Feels like her mind is constantly racing. Focus and concentration difficulties - "I feel like I'm in a trance". Completing tasks. Managing some aspects of household. Works for [employer] - 12 to 9 - requested a leave of absence. Denies SI or HI.  Denies AH or VH. Denies self harm. Denies substance use.  Previous medications: Zoloft,    [place] Office Visit from 03/03/2022 in [place] Office Visit from 11/25/2021 in [place] Office Visit from 08/12/2021 in [place] Office Visit from 04/01/2021 in [place] Office Visit from 12/30/2020 in [place]  Total GAD-7 Score 14 5 8 16 10       PHQ2-9    [place] Visit from 03/03/2022 in [place] Visit from 02/10/2022 in [facility] [place] Visit from 11/25/2021 in [place] Office Visit from 08/12/2021 in [place] Office Visit from 04/01/2021 in [place]  PHQ-2 Total Score 6 5 2 5 3   PHQ-9 Total Score 14 13 8 16 13       Flowsheet Row ED from 05/05/2022 in [facility] Department at [facility] ED from 03/31/2021 in [facility] Emergency Department at [facility] ED from 03/24/2021 in [facility] Emergency Department at [place] No Risk No Risk No Risk        Review of Systems:  Review of Systems  Musculoskeletal:  Negative for gait problem.  Neurological:  Negative for tremors.  Psychiatric/Behavioral:         Please refer to HPI    Medications: I have reviewed the patient's current medications.  Current Outpatient Medications  Medication Sig Dispense Refill   ALPRAZolam (XANAX) 0.5 MG tablet TAKE 1 TABLET (0.5 MG TOTAL) BY MOUTH 3 (THREE) TIMES DAILY AS NEEDED FOR ANXIETY. 90 tablet 2   atorvastatin (LIPITOR) 80 MG tablet TAKE 1 TABLET BY MOUTH EVERY DAY 90 tablet 1   Cholecalciferol (VITAMIN D3) 50 MCG (2000 UT) capsule  Take 1 capsule (2,000 Units total) by mouth daily. (Patient not taking: Reported on 02/10/2022) 90 capsule 1   clindamycin (CLEOCIN) 2 % vaginal cream Place 1 Applicatorful vaginally at bedtime. 40 g 0   Continuous Blood Gluc Receiver (DEXCOM G6 RECEIVER) DEVI 1 Bag by Does not apply route in the morning, at  noon, and at bedtime. 1 each 0   Continuous Blood Gluc Sensor (DEXCOM G6 SENSOR) MISC USE AS DIRECTED EVERY 10 DAYS 9 each 2   Continuous Blood Gluc Transmit (DEXCOM G6 TRANSMITTER) MISC USE AS DIRECTED OVER 90 DAYS 1 each 2   Dulaglutide (TRULICITY) [id number] [id number] SOPN Inject 0.75 mg into the skin once a week. 0.5 mL 2   fluconazole (DIFLUCAN) 200 MG tablet Take 1 tablet (200 mg total) by mouth every 3 (three) days as needed. 2 tablet 0   glucose blood (ACCU-CHEK GUIDE) test strip Check blood sugars 3 times a day before meals 300 strip 11   hydrochlorothiazide (HYDRODIURIL) 25 MG tablet Take 1 tablet (25 mg total) by mouth daily. 90 tablet 1   hydrOXYzine (VISTARIL) 50 MG capsule TAKE 1 CAPSULE BY MOUTH THREE TIMES A DAY AS NEEDED 270 capsule 1   insulin glargine (LANTUS SOLOSTAR) 100 UNIT/ML Solostar Pen Inject 15 Units into the skin daily. 15 mL 3   Insulin Pen Needle (PEN NEEDLES) 32G X 5 MM MISC Use to inject Lantus (insulin) once daily. 100 each 6   LINZESS 145 MCG CAPS capsule Take 1 capsule (145 mcg total) by mouth daily as needed (constipation). 90 capsule 1   lisinopril (ZESTRIL) 20 MG tablet Take 1 tablet (20 mg total) by mouth daily. 90 tablet 1   metoprolol tartrate (LOPRESSOR) 25 MG tablet TAKE 1 TABLET BY MOUTH TWICE A DAY 180 tablet 1   norethindrone (ORTHO MICRONOR) 0.35 MG tablet Take 1 tablet (0.35 mg total) by mouth daily. 84 tablet 1   NOVOLOG 100 UNIT/ML injection INJECT 12 UNITS INTO THE SKIN 3 TIMES A DAY BEFORE MEALS 30 mL 3   omeprazole (PRILOSEC) 20 MG capsule TAKE 1 TABLET AS DAILY IF NEEDED FOR GASTRIC REFLUX 90 capsule 0   ondansetron (ZOFRAN-ODT) 4 MG disintegrating tablet TAKE 1 TABLET BY MOUTH EVERY 8 HOURS AS NEEDED FOR NAUSEA AND VOMITING 18 tablet 3   sertraline (ZOLOFT) 50 MG tablet Take 1 tablet (50 mg total) by mouth daily. 90 tablet 0   No current facility-administered medications for this visit.    Medication Side Effects: None  Allergies: No Known  Allergies  Past Medical History:  Diagnosis Date   Anxiety    Diabetes mellitus without complication ([place])    Gastroparesis    Hypertension    Major depressive disorder     Past Medical History, Surgical history, Social history, and Family history were reviewed and updated as appropriate.   Please see review of systems for further details on the patient's review from today.   Objective:   Physical Exam:  There were no vitals taken for this visit.  Physical Exam Constitutional:      General: She is not in acute distress. Musculoskeletal:        General: No deformity.  Neurological:     Mental Status: She is alert and oriented to person, place, and time.     Coordination: Coordination normal.  Psychiatric:        Attention and Perception: Attention and perception normal. She does not perceive auditory or visual hallucinations.  Mood and Affect: Mood normal. Mood is not anxious or depressed. Affect is not labile, blunt, angry or inappropriate.        Speech: Speech normal.        Behavior: Behavior normal.        Thought Content: Thought content normal. Thought content is not paranoid or delusional. Thought content does not include homicidal or suicidal ideation. Thought content does not include homicidal or suicidal plan.        Cognition and Memory: Cognition and memory normal.        Judgment: Judgment normal.     Comments: Insight intact     Lab Review:     Component Value Date/Time   NA 137 05/05/2022 1330   NA 137 03/03/2022 1128   K 3.7 05/05/2022 1330   CL 102 05/05/2022 1330   CO2 25 05/05/2022 1330   GLUCOSE 210 (H) 05/05/2022 1330   BUN 13 05/05/2022 1330   BUN 8 03/03/2022 1128   CREATININE 0.93 05/05/2022 1330   CALCIUM 9.1 05/05/2022 1330   PROT 7.4 05/05/2022 1330   PROT 7.8 03/03/2022 1128   ALBUMIN 4.0 05/05/2022 1330   ALBUMIN 4.7 03/03/2022 1128   AST 66 (H) 05/05/2022 1330   ALT 61 (H) 05/05/2022 1330   ALKPHOS 61 05/05/2022 1330    BILITOT 0.6 05/05/2022 1330   BILITOT 0.3 03/03/2022 1128   GFRNONAA >60 05/05/2022 1330   GFRAA >60 10/10/2019 1441       Component Value Date/Time   WBC 9.5 05/05/2022 1330   RBC 3.84 (L) 05/05/2022 1330   HGB 12.4 05/05/2022 1330   HCT 36.1 05/05/2022 1330   PLT 243 05/05/2022 1330   MCV 94.0 05/05/2022 1330   MCH 32.3 05/05/2022 1330   MCHC 34.3 05/05/2022 1330   RDW 13.0 05/05/2022 1330   LYMPHSABS 2.3 05/05/2022 1330   MONOABS 0.6 05/05/2022 1330   EOSABS 0.2 05/05/2022 1330   BASOSABS 0.0 05/05/2022 1330    No results found for: "POCLITH", "LITHIUM"   No results found for: "PHENYTOIN", "PHENOBARB", "VALPROATE", "CBMZ"   .res Assessment: Plan:    Plan:  PDMP reviewed  Hydroxyzine 50mg  at hs - using occasionally - too sedating  Plan to discontinue Xanax 0.5mg  to TID for severe anxiety  Will add trial of Clonazepam 0.5mg  4 times daily to help manage anxiety  Patient planning to call [facility] tomorrow to discuss options for IOP program.  Gene sight testing reviewed   RTC 4 weeks  Patient totally disabled and unable to work at this time. Patient will require a leave of absence starting 07/18/2022 through 08/16/2022  Patient was advised to contact office with any questions, adverse effects, or acute worsening in signs and symptoms.  Discussed potential benefits, risk, and side effects of benzodiazepines to include potential risk of tolerance and dependence, as well as possible drowsiness. Advised patient not to drive if experiencing drowsiness and to take lowest possible effective dose to minimize risk of dependence and tolerance.  There are no diagnoses linked to this encounter.   Please see After Visit Summary for patient specific instructions.  Future Appointments  Date Time Provider [place]  07/21/2022  5:00 PM Hanadi Stanly, Berdie Ogren, NP CP-CP None    No orders of the defined types were placed in this  encounter.   -------------------------------

## 2022-07-21 NOTE — Telephone Encounter (Signed)
Noted & received.

## 2022-07-22 ENCOUNTER — Other Ambulatory Visit: Payer: Self-pay | Admitting: Nurse Practitioner

## 2022-07-22 DIAGNOSIS — Z30011 Encounter for initial prescription of contraceptive pills: Secondary | ICD-10-CM

## 2022-07-22 MED ORDER — CLONAZEPAM 0.5 MG PO TABS: 0.5000 mg | ORAL_TABLET | Freq: Three times a day (TID) | ORAL | 2 refills | Status: DC | PRN

## 2022-07-22 NOTE — Addendum Note (Signed)
Addended by: Aloha Gell on: 07/22/2022 12:52 PM   Modules accepted: Orders

## 2022-07-22 NOTE — Telephone Encounter (Signed)
RF request received for Norethindrone 0.35mg  #84.  Last ov 02/10/22 for problem visit.  Patient is due AEX.  Ok'd 1 refill, needs office visit.

## 2022-07-26 ENCOUNTER — Other Ambulatory Visit (INDEPENDENT_AMBULATORY_CARE_PROVIDER_SITE_OTHER): Payer: Self-pay | Admitting: Primary Care

## 2022-07-26 DIAGNOSIS — R112 Nausea with vomiting, unspecified: Secondary | ICD-10-CM

## 2022-07-26 NOTE — Telephone Encounter (Signed)
Will forward to provider  

## 2022-07-27 ENCOUNTER — Other Ambulatory Visit: Payer: Self-pay | Admitting: Adult Health

## 2022-07-27 ENCOUNTER — Telehealth: Payer: Self-pay | Admitting: Adult Health

## 2022-07-27 DIAGNOSIS — Z0289 Encounter for other administrative examinations: Secondary | ICD-10-CM

## 2022-07-27 DIAGNOSIS — F410 Panic disorder [episodic paroxysmal anxiety] without agoraphobia: Secondary | ICD-10-CM

## 2022-07-27 DIAGNOSIS — G4700 Insomnia, unspecified: Secondary | ICD-10-CM

## 2022-07-27 MED ORDER — ALPRAZOLAM 1 MG PO TABS: 1.0000 mg | ORAL_TABLET | Freq: Three times a day (TID) | ORAL | 1 refills | Status: DC | PRN

## 2022-07-27 NOTE — Telephone Encounter (Signed)
FMLA pw completed, signed and faxed per request.   Pt aware.

## 2022-07-27 NOTE — Telephone Encounter (Signed)
Pt left message stating Clonazepam is not working for her. Can't sleep. Toss and turn. Also, headaches. Pt requesting to go back to Xanax and increase to 1 mg.   Contact # [phone]

## 2022-07-27 NOTE — Telephone Encounter (Signed)
LF 3/6, due 4/3.

## 2022-07-27 NOTE — Telephone Encounter (Signed)
Ok to increase for now.

## 2022-07-28 NOTE — Telephone Encounter (Signed)
Needs appt

## 2022-07-28 NOTE — Telephone Encounter (Signed)
Gina sent in Rx.

## 2022-08-03 ENCOUNTER — Telehealth: Payer: Self-pay | Admitting: Adult Health

## 2022-08-03 NOTE — Telephone Encounter (Signed)
Received fax from [employer] for completion of an FMLA form. Placed in Tracy's box to complete.

## 2022-08-12 ENCOUNTER — Encounter: Payer: Self-pay | Admitting: Adult Health

## 2022-08-12 ENCOUNTER — Ambulatory Visit (INDEPENDENT_AMBULATORY_CARE_PROVIDER_SITE_OTHER): Payer: BC Managed Care – PPO | Admitting: Adult Health

## 2022-08-12 ENCOUNTER — Telehealth: Payer: Self-pay | Admitting: Adult Health

## 2022-08-12 DIAGNOSIS — G4700 Insomnia, unspecified: Secondary | ICD-10-CM

## 2022-08-12 DIAGNOSIS — F411 Generalized anxiety disorder: Secondary | ICD-10-CM

## 2022-08-12 DIAGNOSIS — F422 Mixed obsessional thoughts and acts: Secondary | ICD-10-CM

## 2022-08-12 DIAGNOSIS — F410 Panic disorder [episodic paroxysmal anxiety] without agoraphobia: Secondary | ICD-10-CM | POA: Diagnosis not present

## 2022-08-12 DIAGNOSIS — F331 Major depressive disorder, recurrent, moderate: Secondary | ICD-10-CM | POA: Diagnosis not present

## 2022-08-12 NOTE — Progress Notes (Signed)
Gwendolyn Carrillo [id number] August 18, 1975 47 y.o.  Subjective:   Patient ID:  Gwendolyn Carrillo is a 47 y.o. (DOB 06-12-1975) female.  Chief Complaint: No chief complaint on file.   HPI Gwendolyn Carrillo presents to the office today for follow-up of MDD, mixed obsessional thoughts, insomnia, panic attacks, and GAD.   Describes mood today as "about the same". Reports tearfulness. Pleasant. Flat. Mood symptoms - reports depression, anxiety and irritability. Denies recent panic attacks. Reports decreased worry, rumination, and over thinking. Mood is lower. Feels like she is having bad and better days. Working on self care - bathing and getting dressed more. Mostly staying home. Remains on a leave of absence from work and does not feel like she is able to return at this time. Stating "I'm started to see an improvement". Reports ongoing situational stressors - family and financial. Worried about losing her home. Varying interest and motivation. Taking medications as prescribed.  Energy levels lower. Active, does not have a regular exercise routine.  Able to enjoy usual interests and activities. Single. Lives alone. Has 4 children. Family local. Spending time with family. Attends church. Appetite adequate. Weight stable - 195 pounds. Diabetic - A1C elevated. Reports increased acid reflux.   Reports difficulties with sleep - worrying - mind racing. Averages to 4 to 5 hours.   Reports focus and concentration difficulties. Completing minimal tasks. Managing some aspects of household. Works for [employer] - 12 to 9 - out of work on disability Denies SI or HI.  Denies AH or VH. Denies self harm. Denies substance use.  Previous medications: Zoloft, Clonazepam   GAD-7    Flowsheet Row Office Visit from 03/03/2022 in [facility] Visit from 11/25/2021 in [facility] Visit from 08/12/2021 in [facility]  Visit from 04/01/2021 in [facility] Visit from 12/30/2020 in [facility]  Total GAD-7 Score PHQ2-9    Flowsheet Row Office Visit from 03/03/2022 in [facility] Office Visit from 02/10/2022 in [facility] Office Visit from 11/25/2021 [facility] Office Visit from 08/12/2021 in [facility] Office Visit from 04/01/2021 in [facility]  PHQ-2 Total Score PHQ-9 Total Score Flowsheet Row ED from 05/05/2022 in [facility] Emergency Department at [facility] ED from 03/31/2021 in [facility] Emergency Department at [facility] ED from 03/24/2021 in [facility] Emergency Department at [facility]  C-SSRS RISK CATEGORY No Risk No Risk No Risk        Review of Systems:  Review of Systems  Musculoskeletal:  Negative for gait problem.  Neurological:  Negative for tremors.  Psychiatric/Behavioral:         Please refer to HPI    Medications: I have reviewed the patient's current medications.  Current Outpatient Medications  Medication Sig Dispense Refill   ALPRAZolam (XANAX) 1 MG tablet Take 1 tablet (1 mg total) by mouth 3 (three) times daily as needed for anxiety. 90 tablet 1   atorvastatin (LIPITOR) 80 MG tablet TAKE 1 TABLET BY MOUTH EVERY DAY 90 tablet 1   Cholecalciferol (VITAMIN D3) 50 MCG (2000 UT) capsule Take 1 capsule (2,000 Units total) by mouth daily. (Patient not taking:  Reported on 02/10/2022) 90 capsule 1   clindamycin (CLEOCIN) 2 % vaginal cream Place 1 Applicatorful vaginally at bedtime. 40 g 0   clonazePAM (KLONOPIN) 0.5 MG tablet Take 1 tablet (0.5 mg total) by mouth 3 (three) times daily as needed for anxiety. 90 tablet 2   Continuous Blood Gluc Receiver (DEXCOM G6 RECEIVER) DEVI 1 Bag by Does not apply route  in the morning, at noon, and at bedtime. 1 each 0   Continuous Blood Gluc Sensor (DEXCOM G6 SENSOR) MISC USE AS DIRECTED EVERY 10 DAYS 9 each 2   Continuous Blood Gluc Transmit (DEXCOM G6 TRANSMITTER) MISC USE AS DIRECTED OVER 90 DAYS 1 each 2   Dulaglutide (TRULICITY) 0.75 MG/0.5ML SOPN Inject 0.75 mg into the skin once a week. 0.5 mL 2   fluconazole (DIFLUCAN) 200 MG tablet Take 1 tablet (200 mg total) by mouth every 3 (three) days as needed. 2 tablet 0   glucose blood (ACCU-CHEK GUIDE) test strip Check blood sugars 3 times a day before meals 300 strip 11   hydrochlorothiazide (HYDRODIURIL) 25 MG tablet Take 1 tablet (25 mg total) by mouth daily. 90 tablet 1   hydrOXYzine (VISTARIL) 50 MG capsule TAKE 1 CAPSULE BY MOUTH THREE TIMES A DAY AS NEEDED 270 capsule 1   insulin aspart (NOVOLOG FLEXPEN) 100 UNIT/ML FlexPen INJECT INTO THE SKIN 3 TIMES DAILY WITH MEALS FOLLOWING SLIDING SCALE. BLOOD GLUCOSE 200-250: 4 UNITS, 251-300: 6 UNITS, 301-350: 8 UNITS, 351-400: 10 UNITS,>400:12 UNITS AND CALL NP 15 mL 0   Insulin Pen Needle (PEN NEEDLES) 32G X 5 MM MISC Use to inject Lantus (insulin) once daily. 100 each 6   LINZESS 145 MCG CAPS capsule Take 1 capsule (145 mcg total) by mouth daily as needed (constipation). 90 capsule 1   lisinopril (ZESTRIL) 20 MG tablet Take 1 tablet (20 mg total) by mouth daily. 90 tablet 1   metoprolol tartrate (LOPRESSOR) 25 MG tablet TAKE 1 TABLET BY MOUTH TWICE A DAY 180 tablet 1   norethindrone (MICRONOR) 0.35 MG tablet TAKE 1 TABLET BY MOUTH EVERY DAY 84 tablet 0   omeprazole (PRILOSEC) 20 MG capsule TAKE 1 TABLET AS DAILY IF NEEDED FOR GASTRIC REFLUX 90 capsule 0   ondansetron (ZOFRAN-ODT) 4 MG disintegrating tablet TAKE 1 TABLET BY MOUTH EVERY 8 HOURS AS NEEDED FOR NAUSEA AND VOMITING 12 tablet 0   sertraline (ZOLOFT) 50 MG tablet Take 1 tablet (50 mg total) by mouth daily. 90 tablet 0   No current facility-administered medications for this visit.    Medication Side  Effects: None  Allergies: No Known Allergies  Past Medical History:  Diagnosis Date   Anxiety    Diabetes mellitus without complication (HCC)    Gastroparesis    Hypertension    Major depressive disorder     Past Medical History, Surgical history, Social history, and Family history were reviewed and updated as appropriate.   Please see review of systems for further details on the patient's review from today.   Objective:   Physical Exam:  There were no vitals taken for this visit.  Physical Exam Constitutional:      General: She is not in acute distress. Musculoskeletal:        General: No deformity.  Neurological:     Mental Status: She is alert and oriented to person, place, and time.     Coordination: Coordination normal.  Psychiatric:        Attention and Perception: Attention and perception normal. She does  not perceive auditory or visual hallucinations.        Mood and Affect: Mood normal. Mood is not anxious or depressed. Affect is not labile, blunt, angry or inappropriate.        Speech: Speech normal.        Behavior: Behavior normal.        Thought Content: Thought content normal. Thought content is not paranoid or delusional. Thought content does not include homicidal or suicidal ideation. Thought content does not include homicidal or suicidal plan.        Cognition and Memory: Cognition and memory normal.        Judgment: Judgment normal.     Comments: Insight intact     Lab Review:     Component Value Date/Time   NA 137 05/05/2022 1330   NA 137 03/03/2022 1128   K 3.7 05/05/2022 1330   CL 102 05/05/2022 1330   CO2 25 05/05/2022 1330   GLUCOSE 210 (H) 05/05/2022 1330   BUN 13 05/05/2022 1330   BUN 8 03/03/2022 1128   CREATININE 0.93 05/05/2022 1330   CALCIUM 9.1 05/05/2022 1330   PROT 7.4 05/05/2022 1330   PROT 7.8 03/03/2022 1128   ALBUMIN 4.0 05/05/2022 1330   ALBUMIN 4.7 03/03/2022 1128   AST 66 (H) 05/05/2022 1330   ALT 61 (H) 05/05/2022  1330   ALKPHOS 61 05/05/2022 1330   BILITOT 0.6 05/05/2022 1330   BILITOT 0.3 03/03/2022 1128   GFRNONAA >60 05/05/2022 1330   GFRAA >60 10/10/2019 1441       Component Value Date/Time   WBC 9.5 05/05/2022 1330   RBC 3.84 (L) 05/05/2022 1330   HGB 12.4 05/05/2022 1330   HCT 36.1 05/05/2022 1330   PLT 243 05/05/2022 1330   MCV 94.0 05/05/2022 1330   MCH 32.3 05/05/2022 1330   MCHC 34.3 05/05/2022 1330   RDW 13.0 05/05/2022 1330   LYMPHSABS 2.3 05/05/2022 1330   MONOABS 0.6 05/05/2022 1330   EOSABS 0.2 05/05/2022 1330   BASOSABS 0.0 05/05/2022 1330    No results found for: "POCLITH", "LITHIUM"   No results found for: "PHENYTOIN", "PHENOBARB", "VALPROATE", "CBMZ"   .res Assessment: Plan:    Plan:  PDMP reviewed  Hydroxyzine  at hs - using occasionally - too sedating  Increase Xanax 0.5mg  to  TID for severe anxiety D/C Clonazepam 0.5mg  4 times daily to help manage anxiety  Planning to contact IOP program  Gene sight testing reviewed   RTC 4 weeks  Patient totally disabled and unable to work at this time. Patient will require a leave of absence starting 08/12/2022 through 09/13/2022. Will re-evaluate a return to work date at next visit on 09/13/2022.  Patient was advised to contact office with any questions, adverse effects, or acute worsening in signs and symptoms.  Discussed potential benefits, risk, and side effects of benzodiazepines to include potential risk of tolerance and dependence, as well as possible drowsiness. Advised patient not to drive if experiencing drowsiness and to take lowest possible effective dose to minimize risk of dependence and tolerance.  There are no diagnoses linked to this encounter.   Please see After Visit Summary for patient specific instructions.  No future appointments.  No orders of the defined types were placed in this encounter.   -------------------------------

## 2022-08-12 NOTE — Telephone Encounter (Signed)
Received Disabiltiy and [facility] Provider Statement. Given to [facility] 4/18

## 2022-08-13 ENCOUNTER — Telehealth (INDEPENDENT_AMBULATORY_CARE_PROVIDER_SITE_OTHER): Payer: Self-pay | Admitting: Primary Care

## 2022-08-13 ENCOUNTER — Other Ambulatory Visit (INDEPENDENT_AMBULATORY_CARE_PROVIDER_SITE_OTHER): Payer: Self-pay | Admitting: Primary Care

## 2022-08-13 NOTE — Telephone Encounter (Signed)
Rx was sent to pharmacy on 07/28/22 #40mL/0  Requested Prescriptions  Refused Prescriptions Disp Refills   NOVOLOG FLEXPEN 100 UNIT/ML FlexPen [Pharmacy Med Name: NOVOLOG 100 UNIT/ML FLEXPEN]      Sig: INJECT INTO THE SKIN 3 TIMES DAILY WITH MEALS FOLLOWING SLIDING SCALE. BLOOD GLUCOSE 200-250: 4 UNITS, 251-300: 6 UNITS, 301-350: 8 UNITS, 351-400: 10 UNITS,>400:12 UNITS AND CALL NP     Endocrinology:  Diabetes - Insulins Failed - 08/13/2022  1:08 PM      Failed - HBA1C is between 0 and 7.9 and within 180 days    HbA1c, POC (controlled diabetic range)  Date Value Ref Range Status  03/03/2022 8.0 (A) 0.0 - 7.0 % Final         Passed - Valid encounter within last 6 months    Recent Outpatient Visits           5 months ago Type 2 diabetes mellitus without complication, without long-term current use of insulin (HCC)   [place] [facility] Grayce Sessions, NP   8 months ago Type 2 diabetes mellitus without complication, without long-term current use of insulin (HCC)   [place] [facility] Grayce Sessions, NP   1 year ago Type 2 diabetes mellitus without complication, without long-term current use of insulin (HCC)   [place] [facility] Grayce Sessions, NP   1 year ago Type 2 diabetes mellitus without complication, without long-term current use of insulin (HCC)   [place] [facility] Grayce Sessions, NP   1 year ago Type 2 diabetes mellitus without complication, with long-term current use of insulin [facility])   [place] [facility], Cornelius Moras, RPH-CPP       Future Appointments             In 1 week Randa Evens, Kinnie Scales, NP  [facility]

## 2022-08-13 NOTE — Telephone Encounter (Signed)
Paper work completed and given to [place] to sign, then will fax per request

## 2022-08-13 NOTE — Telephone Encounter (Signed)
Pt is calling in because she says Marcelino Duster keeps prescribing her a 30 day supply of insulin aspart (NOVOLOG FLEXPEN) 100 UNIT/ML FlexPen [[id number]] when she needs a 90 day supply for her insurance to cover it. Per pt she has stated  multiple times that the insurance it won't cover it if it's not a 90 day supply. Please follow up with pt.

## 2022-08-13 NOTE — Telephone Encounter (Signed)
Paper work completed, see other phone notes

## 2022-08-16 MED ORDER — NOVOLOG FLEXPEN 100 UNIT/ML ~~LOC~~ SOPN: PEN_INJECTOR | SUBCUTANEOUS | 1 refills | Status: DC

## 2022-08-16 NOTE — Telephone Encounter (Signed)
Franky Macho could you send in 90 day supply please

## 2022-08-16 NOTE — Telephone Encounter (Signed)
Yes ma'am. The math doesn't work out evenly; however, I sent the closest supply.

## 2022-08-24 ENCOUNTER — Telehealth (INDEPENDENT_AMBULATORY_CARE_PROVIDER_SITE_OTHER): Payer: Self-pay | Admitting: Primary Care

## 2022-08-24 ENCOUNTER — Ambulatory Visit (INDEPENDENT_AMBULATORY_CARE_PROVIDER_SITE_OTHER): Payer: BC Managed Care – PPO | Admitting: Primary Care

## 2022-08-24 NOTE — Telephone Encounter (Signed)
Patient was called to reschedule upcoming appointment. Appointment is not scheduled for May 7th at 9:10am. Patient stated that she needs a refill on her Lorin Glass and it needs to be a 30 day supply so that it is covered by insurance.

## 2022-08-24 NOTE — Telephone Encounter (Signed)
Luke could you take care of this  ?

## 2022-08-25 ENCOUNTER — Other Ambulatory Visit: Payer: Self-pay | Admitting: Pharmacist

## 2022-08-25 MED ORDER — NOVOLOG FLEXPEN 100 UNIT/ML ~~LOC~~ SOPN: PEN_INJECTOR | SUBCUTANEOUS | 1 refills | Status: DC

## 2022-08-28 ENCOUNTER — Other Ambulatory Visit: Payer: Self-pay | Admitting: Cardiology

## 2022-08-28 DIAGNOSIS — R002 Palpitations: Secondary | ICD-10-CM

## 2022-08-31 ENCOUNTER — Ambulatory Visit (INDEPENDENT_AMBULATORY_CARE_PROVIDER_SITE_OTHER): Payer: BC Managed Care – PPO | Admitting: Primary Care

## 2022-08-31 ENCOUNTER — Encounter (INDEPENDENT_AMBULATORY_CARE_PROVIDER_SITE_OTHER): Payer: Self-pay | Admitting: Primary Care

## 2022-08-31 VITALS — BP 138/94 | HR 109 | Resp 16 | Ht 64.0 in | Wt 202.6 lb

## 2022-08-31 DIAGNOSIS — E119 Type 2 diabetes mellitus without complications: Secondary | ICD-10-CM

## 2022-08-31 DIAGNOSIS — Z1159 Encounter for screening for other viral diseases: Secondary | ICD-10-CM

## 2022-08-31 DIAGNOSIS — E782 Mixed hyperlipidemia: Secondary | ICD-10-CM | POA: Diagnosis not present

## 2022-08-31 DIAGNOSIS — R5383 Other fatigue: Secondary | ICD-10-CM

## 2022-08-31 DIAGNOSIS — Z1211 Encounter for screening for malignant neoplasm of colon: Secondary | ICD-10-CM | POA: Diagnosis not present

## 2022-08-31 DIAGNOSIS — E559 Vitamin D deficiency, unspecified: Secondary | ICD-10-CM | POA: Diagnosis not present

## 2022-08-31 DIAGNOSIS — F32A Depression, unspecified: Secondary | ICD-10-CM

## 2022-08-31 DIAGNOSIS — I10 Essential (primary) hypertension: Secondary | ICD-10-CM

## 2022-08-31 LAB — POCT GLYCOSYLATED HEMOGLOBIN (HGB A1C): HbA1c, POC (controlled diabetic range): 8.9 % — AB (ref 0.0–7.0)

## 2022-08-31 MED ORDER — LISINOPRIL 20 MG PO TABS: 20.0000 mg | ORAL_TABLET | Freq: Every day | ORAL | 1 refills | Status: AC

## 2022-08-31 MED ORDER — LINZESS 145 MCG PO CAPS: 145.0000 ug | ORAL_CAPSULE | Freq: Every day | ORAL | 1 refills | Status: AC | PRN

## 2022-08-31 MED ORDER — HYDROCHLOROTHIAZIDE 25 MG PO TABS: 25.0000 mg | ORAL_TABLET | Freq: Every day | ORAL | 1 refills | Status: AC

## 2022-08-31 NOTE — Progress Notes (Signed)
[facility]  Gwendolyn Carrillo, is a 47 y.o. female  [id number]  [id number]  DOB - June 28, 1975  Chief Complaint  Patient presents with   Diabetes   Hypertension    Pt hasn't taken any meds this morning       Subjective:   Gwendolyn Carrillo is a 47 y.o. female here today for a follow up visit. Patient has No headache, No chest pain, No abdominal pain - No Nausea, No new weakness tingling or numbness, No Cough - shortness of breath. She is overwhelm with stress followed by psyche son 65 admitted to behavior health with mental illness. Family issues and financial.   No problems updated.  No Known Allergies  Past Medical History:  Diagnosis Date   Anxiety    Diabetes mellitus without complication (HCC)    Gastroparesis    Hypertension    Major depressive disorder     Current Outpatient Medications on File Prior to Visit  Medication Sig Dispense Refill   ALPRAZolam (XANAX) 1 MG tablet Take 1 tablet (1 mg total) by mouth 3 (three) times daily as needed for anxiety. 90 tablet 1   atorvastatin (LIPITOR) 80 MG tablet TAKE 1 TABLET BY MOUTH EVERY DAY 90 tablet 1   Cholecalciferol (VITAMIN D3) 50 MCG (2000 UT) capsule Take 1 capsule (2,000 Units total) by mouth daily. (Patient not taking: Reported on 02/10/2022) 90 capsule 1   clindamycin (CLEOCIN) 2 % vaginal cream Place 1 Applicatorful vaginally at bedtime. 40 g 0   clonazePAM (KLONOPIN) 0.5 MG tablet Take 1 tablet (0.5 mg total) by mouth 3 (three) times daily as needed for anxiety. 90 tablet 2   Continuous Blood Gluc Receiver (DEXCOM G6 RECEIVER) DEVI 1 Bag by Does not apply route in the morning, at noon, and at bedtime. 1 each 0   Continuous Blood Gluc Sensor (DEXCOM G6 SENSOR) MISC USE AS DIRECTED EVERY 10 DAYS 9 each 2   Continuous Blood Gluc Transmit (DEXCOM G6 TRANSMITTER) MISC USE AS DIRECTED OVER 90 DAYS 1 each 2   fluconazole (DIFLUCAN) 200 MG tablet Take 1 tablet (200 mg total) by mouth every 3  (three) days as needed. 2 tablet 0   glucose blood (ACCU-CHEK GUIDE) test strip Check blood sugars 3 times a day before meals 300 strip 11   hydrOXYzine (VISTARIL) 50 MG capsule TAKE 1 CAPSULE BY MOUTH THREE TIMES A DAY AS NEEDED 270 capsule 1   insulin aspart (NOVOLOG FLEXPEN) 100 UNIT/ML FlexPen INJECT up to 12u INTO THE SKIN 3 TIMES DAILY WITH MEALS per sliding scale: 200-250: 4 UNITS, 251-300: 6 UNITS, 301-350: 8 UNITS, 351-400: 10 UNITS,>400:12 UNITS AND CALL NP. Max daily dose: 36u 12 mL 1   Insulin Pen Needle (PEN NEEDLES) 32G X 5 MM MISC Use to inject Lantus (insulin) once daily. 100 each 6   metoprolol tartrate (LOPRESSOR) 25 MG tablet TAKE 1 TABLET BY MOUTH TWICE A DAY 180 tablet 1   norethindrone (MICRONOR) 0.35 MG tablet TAKE 1 TABLET BY MOUTH EVERY DAY 84 tablet 0   omeprazole (PRILOSEC) 20 MG capsule TAKE 1 TABLET AS DAILY IF NEEDED FOR GASTRIC REFLUX 90 capsule 0   ondansetron (ZOFRAN-ODT) 4 MG disintegrating tablet TAKE 1 TABLET BY MOUTH EVERY 8 HOURS AS NEEDED FOR NAUSEA AND VOMITING 12 tablet 0   No current facility-administered medications on file prior to visit.    Objective:   Vitals:   08/31/22 0859 08/31/22 0901  BP: (Abnormal) 144/97 (Abnormal) 138/94  Pulse: (Abnormal) 109  Resp: 16   SpO2: 97%   Weight: 202 lb 9.6 oz (91.9 kg)   Height: 5\' 4"  (1.626 m)     Comprehensive ROS Pertinent positive and negative noted in HPI   Exam General appearance : Awake, alert, not in any distress. Speech Clear. Not toxic looking HEENT: Atraumatic and Normocephalic, pupils equally reactive to light and accomodation Neck: Supple, no JVD. No cervical lymphadenopathy.  Chest: Good air entry bilaterally, no added sounds  CVS: S1 S2 regular, no murmurs.  Abdomen: Bowel sounds present, Non tender and not distended with no gaurding, rigidity or rebound. Extremities: B/L Lower Ext shows no edema, both legs are warm to touch Neurology: Awake alert, and oriented X 3, CN II-XII  intact, Non focal Skin: No Rash  Data Review Lab Results  Component Value Date   HGBA1C 8.9 (A) 08/31/2022   HGBA1C 8.0 (A) 03/03/2022   HGBA1C 7.6 (A) 11/25/2021    Assessment & Plan  Gwendolyn Carrillo was seen today for diabetes and hypertension.  Diagnoses and all orders for this visit:  Type 2 diabetes mellitus without complication, without long-term current use of insulin (HCC) -     POCT glycosylated hemoglobin (Hb A1C) increase to 8.9 unable to take ozempic  Encounter for HCV screening test for low risk patient -     HCV Ab w Reflex to Quant PCR  Mixed hyperlipidemia Lipids  Increase risk of heart attack and/or stroke.  To reduce your Cholesterol , Remember - more fruits and vegetables, more fish, and limit red meat and dairy products.  More soy, nuts, beans, barley, lentils, oats and plant sterol ester enriched margarine instead of butter.  I also encourage eliminating sugar and processed food.   Colon cancer screening -     Cologuard  Primary hypertension BP goal - < 130/80 Explained that having normal blood pressure is the goal and medications are helping to get to goal and maintain normal blood pressure. DIET: Limit salt intake, read nutrition labels to check salt content, limit fried and high fatty foods  Avoid using multisymptom OTC cold preparations that generally contain sudafed which can rise BP. Consult with pharmacist on best cold relief products to use for persons with HTN EXERCISE Discussed incorporating exercise such as walking - 30 minutes most days of the week and can do in 10 minute intervals     Was followed by cardiology but balance   Patient have been counseled extensively about nutrition and exercise. Other issues discussed during this visit include: low cholesterol diet, weight control and daily exercise, foot care, annual eye examinations at Ophthalmology, importance of adherence with medications and regular follow-up. We also discussed long term  complications of uncontrolled diabetes and hypertension.   No follow-ups on file.  The patient was given clear instructions to go to ER or return to medical center if symptoms don't improve, worsen or new problems develop. The patient verbalized understanding. The patient was told to call to get lab results if they haven't heard anything in the next week.   This note has been created with Education officer, environmental. Any transcriptional errors are unintentional.   Grayce Sessions, NP 08/31/2022, 10:05 AM

## 2022-09-01 LAB — HCV INTERPRETATION

## 2022-09-01 LAB — CBC WITH DIFFERENTIAL/PLATELET
Basophils Absolute: 0.1 10*3/uL (ref 0.0–0.2)
Basos: 1 %
EOS (ABSOLUTE): 0.3 10*3/uL (ref 0.0–0.4)
Eos: 3 %
Hematocrit: 41 % (ref 34.0–46.6)
Hemoglobin: 13.9 g/dL (ref 11.1–15.9)
Immature Grans (Abs): 0.1 10*3/uL (ref 0.0–0.1)
Immature Granulocytes: 1 %
Lymphocytes Absolute: 2.3 10*3/uL (ref 0.7–3.1)
Lymphs: 22 %
MCH: 32.5 pg (ref 26.6–33.0)
MCHC: 33.9 g/dL (ref 31.5–35.7)
MCV: 96 fL (ref 79–97)
Monocytes Absolute: 0.6 10*3/uL (ref 0.1–0.9)
Monocytes: 6 %
Neutrophils Absolute: 7.2 10*3/uL — ABNORMAL HIGH (ref 1.4–7.0)
Neutrophils: 67 %
Platelets: 249 10*3/uL (ref 150–450)
RBC: 4.28 x10E6/uL (ref 3.77–5.28)
RDW: 12.6 % (ref 11.7–15.4)
WBC: 10.5 10*3/uL (ref 3.4–10.8)

## 2022-09-01 LAB — LIPID PANEL
Chol/HDL Ratio: 7.8 ratio — ABNORMAL HIGH (ref 0.0–4.4)
Cholesterol, Total: 249 mg/dL — ABNORMAL HIGH (ref 100–199)
HDL: 32 mg/dL — ABNORMAL LOW (ref >39)
LDL Chol Calc (NIH): 180 mg/dL — ABNORMAL HIGH (ref 0–99)
Triglycerides: 197 mg/dL — ABNORMAL HIGH (ref 0–149)
VLDL Cholesterol Cal: 37 mg/dL (ref 5–40)

## 2022-09-01 LAB — CMP14+EGFR
ALT: 78 IU/L — ABNORMAL HIGH (ref 0–32)
AST: 77 IU/L — ABNORMAL HIGH (ref 0–40)
Albumin/Globulin Ratio: 1.3 (ref 1.2–2.2)
Albumin: 4.3 g/dL (ref 3.9–4.9)
Alkaline Phosphatase: 86 IU/L (ref 44–121)
BUN/Creatinine Ratio: 15 (ref 9–23)
BUN: 12 mg/dL (ref 6–24)
Bilirubin Total: 0.3 mg/dL (ref 0.0–1.2)
CO2: 20 mmol/L (ref 20–29)
Calcium: 9.6 mg/dL (ref 8.7–10.2)
Chloride: 103 mmol/L (ref 96–106)
Creatinine, Ser: 0.78 mg/dL (ref 0.57–1.00)
Globulin, Total: 3.3 g/dL (ref 1.5–4.5)
Glucose: 109 mg/dL — ABNORMAL HIGH (ref 70–99)
Potassium: 3.9 mmol/L (ref 3.5–5.2)
Sodium: 141 mmol/L (ref 134–144)
Total Protein: 7.6 g/dL (ref 6.0–8.5)
eGFR: 95 mL/min/{1.73_m2} (ref >59)

## 2022-09-01 LAB — VITAMIN B12: Vitamin B-12: 631 pg/mL (ref 232–1245)

## 2022-09-01 LAB — HCV AB W REFLEX TO QUANT PCR: HCV Ab: NONREACTIVE

## 2022-09-01 LAB — VITAMIN D 25 HYDROXY (VIT D DEFICIENCY, FRACTURES): Vit D, 25-Hydroxy: 16.9 ng/mL — ABNORMAL LOW (ref 30.0–100.0)

## 2022-09-08 ENCOUNTER — Other Ambulatory Visit (INDEPENDENT_AMBULATORY_CARE_PROVIDER_SITE_OTHER): Payer: Self-pay | Admitting: Primary Care

## 2022-09-08 DIAGNOSIS — E782 Mixed hyperlipidemia: Secondary | ICD-10-CM

## 2022-09-08 MED ORDER — ATORVASTATIN CALCIUM 80 MG PO TABS: 80.0000 mg | ORAL_TABLET | Freq: Every day | ORAL | 1 refills | Status: AC

## 2022-09-14 DIAGNOSIS — Z1211 Encounter for screening for malignant neoplasm of colon: Secondary | ICD-10-CM | POA: Diagnosis not present

## 2022-09-15 ENCOUNTER — Telehealth (INDEPENDENT_AMBULATORY_CARE_PROVIDER_SITE_OTHER): Payer: Self-pay | Admitting: Primary Care

## 2022-09-15 ENCOUNTER — Encounter (INDEPENDENT_AMBULATORY_CARE_PROVIDER_SITE_OTHER): Payer: Self-pay

## 2022-09-15 NOTE — Telephone Encounter (Signed)
Returned pt call pt didn't answer lvm. Also sent pt a MyChart message

## 2022-09-22 LAB — COLOGUARD: COLOGUARD: NEGATIVE

## 2022-09-24 DIAGNOSIS — Z113 Encounter for screening for infections with a predominantly sexual mode of transmission: Secondary | ICD-10-CM | POA: Diagnosis not present

## 2022-09-28 ENCOUNTER — Telehealth: Payer: BC Managed Care – PPO | Admitting: Nurse Practitioner

## 2022-09-28 DIAGNOSIS — N898 Other specified noninflammatory disorders of vagina: Secondary | ICD-10-CM

## 2022-09-29 ENCOUNTER — Other Ambulatory Visit: Payer: Self-pay | Admitting: Nurse Practitioner

## 2022-09-29 ENCOUNTER — Other Ambulatory Visit: Payer: Self-pay | Admitting: Adult Health

## 2022-09-29 ENCOUNTER — Other Ambulatory Visit (INDEPENDENT_AMBULATORY_CARE_PROVIDER_SITE_OTHER): Payer: Self-pay | Admitting: Primary Care

## 2022-09-29 DIAGNOSIS — Z30011 Encounter for initial prescription of contraceptive pills: Secondary | ICD-10-CM

## 2022-09-29 DIAGNOSIS — G4700 Insomnia, unspecified: Secondary | ICD-10-CM

## 2022-09-29 DIAGNOSIS — F410 Panic disorder [episodic paroxysmal anxiety] without agoraphobia: Secondary | ICD-10-CM

## 2022-09-29 DIAGNOSIS — R112 Nausea with vomiting, unspecified: Secondary | ICD-10-CM

## 2022-09-29 MED ORDER — FLUCONAZOLE 150 MG PO TABS: 150.0000 mg | ORAL_TABLET | Freq: Once | ORAL | 0 refills | Status: AC

## 2022-09-29 NOTE — Telephone Encounter (Signed)
Requested medication (s) are due for refill today:   Provider to review  Requested medication (s) are on the active medication list:   Yes  Future visit scheduled:   Yes 12/13/2022   Last ordered: 07/28/2022 #12, 0 refills  Non delegated refill      Requested Prescriptions  Pending Prescriptions Disp Refills   ondansetron (ZOFRAN-ODT) 4 MG disintegrating tablet [Pharmacy Med Name: ONDANSETRON ODT 4 MG TABLET] 12 tablet 0    Sig: TAKE 1 TABLET BY MOUTH EVERY 8 HOURS AS NEEDED FOR NAUSEA AND VOMITING     Not Delegated - Gastroenterology: Antiemetics - ondansetron Failed - 09/29/2022  8:59 AM      Failed - This refill cannot be delegated      Failed - AST in normal range and within 360 days    AST  Date Value Ref Range Status  08/31/2022 77 (H) 0 - 40 IU/L Final         Failed - ALT in normal range and within 360 days    ALT  Date Value Ref Range Status  08/31/2022 78 (H) 0 - 32 IU/L Final         Passed - Valid encounter within last 6 months    Recent Outpatient Visits           4 weeks ago Type 2 diabetes mellitus without complication, without long-term current use of insulin (HCC)   [place] [facility] Grayce Sessions, NP   7 months ago Type 2 diabetes mellitus without complication, without long-term current use of insulin (HCC)   [place] [facility] Grayce Sessions, NP   10 months ago Type 2 diabetes mellitus without complication, without long-term current use of insulin (HCC)   [place] [facility] Grayce Sessions, NP   1 year ago Type 2 diabetes mellitus without complication, without long-term current use of insulin (HCC)   [place] [facility] Grayce Sessions, NP   1 year ago Type 2 diabetes mellitus without complication, without long-term current use of insulin [facility])   [place] [facility] Grayce Sessions, NP       Future Appointments             In  2 months Randa Evens, Kinnie Scales, NP [place] [facility]

## 2022-09-29 NOTE — Telephone Encounter (Signed)
Med refill request: Last Office Visit: 02/10/22 per ov note AEX due 1 year. Next AEX: none scheduled Last MMG (if hormonal med) none Refill sent through 01/2023.

## 2022-09-29 NOTE — Progress Notes (Signed)

## 2022-10-01 NOTE — Telephone Encounter (Signed)
My Chart message sent

## 2022-10-04 NOTE — Telephone Encounter (Signed)
Please call patient to schedule an appt.-

## 2022-10-07 ENCOUNTER — Telehealth: Payer: Self-pay | Admitting: Adult Health

## 2022-10-07 ENCOUNTER — Other Ambulatory Visit: Payer: Self-pay | Admitting: Adult Health

## 2022-10-07 DIAGNOSIS — F410 Panic disorder [episodic paroxysmal anxiety] without agoraphobia: Secondary | ICD-10-CM

## 2022-10-07 DIAGNOSIS — G4700 Insomnia, unspecified: Secondary | ICD-10-CM

## 2022-10-07 MED ORDER — ALPRAZOLAM 1 MG PO TABS: 1.0000 mg | ORAL_TABLET | Freq: Three times a day (TID) | ORAL | 1 refills | Status: DC | PRN

## 2022-10-07 NOTE — Telephone Encounter (Signed)
Pt LVM @ 12:15p.  She is requesting the refill of Alprazolam.  She knows she needs to make an appt, but she said she just started a new job with a very strict training schedule and no insurance to kick in until August.  Pls send to [facility].  No upcoming appt scheduled

## 2022-10-07 NOTE — Telephone Encounter (Signed)
Script sent  

## 2022-10-08 NOTE — Telephone Encounter (Signed)
Pt is scheduled for 7/3

## 2022-10-27 ENCOUNTER — Ambulatory Visit: Payer: BC Managed Care – PPO | Admitting: Adult Health

## 2022-11-16 ENCOUNTER — Other Ambulatory Visit (INDEPENDENT_AMBULATORY_CARE_PROVIDER_SITE_OTHER): Payer: Self-pay | Admitting: Primary Care

## 2022-11-24 ENCOUNTER — Other Ambulatory Visit (INDEPENDENT_AMBULATORY_CARE_PROVIDER_SITE_OTHER): Payer: Self-pay | Admitting: Primary Care

## 2022-11-25 ENCOUNTER — Other Ambulatory Visit (INDEPENDENT_AMBULATORY_CARE_PROVIDER_SITE_OTHER): Payer: Self-pay | Admitting: Primary Care

## 2022-11-25 NOTE — Telephone Encounter (Signed)
Requested Prescriptions  Pending Prescriptions Disp Refills   glucose blood (ACCU-CHEK GUIDE) test strip [Pharmacy Med Name: ACCU-CHEK GUIDE TEST STRIP] 300 strip 1    Sig: CHECK BLOOD SUGARS 3 TIMES A DAY BEFORE MEALS     Endocrinology: Diabetes - Testing Supplies Passed - 11/24/2022  8:48 PM      Passed - Valid encounter within last 12 months    Recent Outpatient Visits           2 months ago Type 2 diabetes mellitus without complication, without long-term current use of insulin (HCC)   [place] [facility] Grayce Sessions, NP   8 months ago Type 2 diabetes mellitus without complication, without long-term current use of insulin (HCC)   [place] [facility] Grayce Sessions, NP   1 year ago Type 2 diabetes mellitus without complication, without long-term current use of insulin (HCC)   [place] [facility] Grayce Sessions, NP   1 year ago Type 2 diabetes mellitus without complication, without long-term current use of insulin (HCC)   [place] [facility] Grayce Sessions, NP   1 year ago Type 2 diabetes mellitus without complication, without long-term current use of insulin [facility])   [place] [facility] Grayce Sessions, NP       Future Appointments             In 2 weeks Randa Evens, Kinnie Scales, NP [place] [facility]

## 2022-11-26 NOTE — Telephone Encounter (Signed)
Requested medication (s) are due for refill today:   Yes  Requested medication (s) are on the active medication list:   Yes  Future visit scheduled:   Yes 12/13/2022   Last ordered: 11/25/2022 #300, 1 refill  Returned because this is a non formulary.    Alternative being requested  Requested Prescriptions  Pending Prescriptions Disp Refills   ACCU-CHEK GUIDE test strip [Pharmacy Med Name: ACCU-CHEK GUIDE TEST STRIP] 300 strip 1    Sig: CHECK BLOOD SUGARS 3 TIMES A DAY BEFORE MEALS     Endocrinology: Diabetes - Testing Supplies Passed - 11/25/2022  3:44 PM      Passed - Valid encounter within last 12 months    Recent Outpatient Visits           2 months ago Type 2 diabetes mellitus without complication, without long-term current use of insulin (HCC)   [place] [facility] Grayce Sessions, NP   8 months ago Type 2 diabetes mellitus without complication, without long-term current use of insulin (HCC)   [place] [facility] Grayce Sessions, NP   1 year ago Type 2 diabetes mellitus without complication, without long-term current use of insulin (HCC)   [place] [facility] Grayce Sessions, NP   1 year ago Type 2 diabetes mellitus without complication, without long-term current use of insulin (HCC)   [place] [facility] Grayce Sessions, NP   1 year ago Type 2 diabetes mellitus without complication, without long-term current use of insulin [facility])   [place] [facility] Grayce Sessions, NP       Future Appointments             In 2 weeks Randa Evens, Kinnie Scales, NP [place] [facility]

## 2022-11-29 MED ORDER — ONETOUCH DELICA LANCETS 33G MISC: 2 refills | Status: AC

## 2022-11-29 MED ORDER — ONETOUCH VERIO W/DEVICE KIT: PACK | 0 refills | Status: AC

## 2022-11-29 MED ORDER — ONETOUCH VERIO VI STRP: ORAL_STRIP | 2 refills | Status: AC

## 2022-11-29 NOTE — Telephone Encounter (Signed)
Would you be able to change

## 2022-12-01 ENCOUNTER — Ambulatory Visit (INDEPENDENT_AMBULATORY_CARE_PROVIDER_SITE_OTHER): Payer: Self-pay | Admitting: Primary Care

## 2022-12-06 ENCOUNTER — Ambulatory Visit (INDEPENDENT_AMBULATORY_CARE_PROVIDER_SITE_OTHER): Payer: BC Managed Care – PPO | Admitting: Primary Care

## 2022-12-07 ENCOUNTER — Encounter: Payer: Self-pay | Admitting: Adult Health

## 2022-12-07 ENCOUNTER — Ambulatory Visit: Payer: BC Managed Care – PPO | Admitting: Adult Health

## 2022-12-07 ENCOUNTER — Ambulatory Visit (INDEPENDENT_AMBULATORY_CARE_PROVIDER_SITE_OTHER): Payer: 59 | Admitting: Adult Health

## 2022-12-07 DIAGNOSIS — F410 Panic disorder [episodic paroxysmal anxiety] without agoraphobia: Secondary | ICD-10-CM

## 2022-12-07 DIAGNOSIS — F411 Generalized anxiety disorder: Secondary | ICD-10-CM | POA: Diagnosis not present

## 2022-12-07 DIAGNOSIS — G4700 Insomnia, unspecified: Secondary | ICD-10-CM

## 2022-12-07 DIAGNOSIS — F331 Major depressive disorder, recurrent, moderate: Secondary | ICD-10-CM

## 2022-12-07 MED ORDER — ALPRAZOLAM 1 MG PO TABS: 1.0000 mg | ORAL_TABLET | Freq: Three times a day (TID) | ORAL | 0 refills | Status: DC | PRN

## 2022-12-07 NOTE — Progress Notes (Signed)
LAPORSCHA LOEFFEL [id number] 17-Mar-1976 47 y.o.  Subjective:   Patient ID:  Gwendolyn Carrillo is a 47 y.o. (DOB [birth year]) female.  Chief Complaint: No chief complaint on file.   HPI OLUWADARASIMI STEARNS presents to the office today for follow-up of MDD, mixed obsessional thoughts, insomnia, panic attacks, and GAD.   Describes mood today as "ok". Reports tearfulness. Pleasant. Flat. Mood symptoms - denies depression - "I feel emotional". Denies irritability. Reports anxiety. Reports recent panic attacks. Reports worry, rumination, and over thinking. Mood is lower. Stating "I feel like I've had too much". Reports increased situational stressors surrounding her children. Reports starting a new job at [employer] - remote position. Varying interest and motivation. Taking medications as prescribed.  Energy levels lower. Active, does not have a regular exercise routine.  Able to enjoy usual interests and activities. Single. Lives with companion. Has 4 children. Family local. Spending time with family. Attends church. Appetite adequate. Weight gain - 200 pounds. Diabetic - A1C elevated.  Reports sleeping better some nights than others. Averages 8 to 9 hours during the work week and longer on the weekends. Reports weird dreams - talking in her sleep. Reports focus and concentration difficulties. Completing minimal tasks. Managing some aspects of household. Working for [employer] from home - started on September 27, 2022. Denies SI or HI.  Denies AH or VH. Denies self harm. Denies substance use.  Previous medications: Zoloft, Clonazepam   GAD-7    Flowsheet Row Office Visit from 08/31/2022 in [facility] Office Visit from 03/03/2022 in [facility] Office Visit from 11/25/2021 in [facility] Office Visit from 08/12/2021 in [facility] Office Visit from 04/01/2021 in [facility]   Total GAD-7 Score 12 14 5 8 16       PHQ2-9    Flowsheet Row Office Visit from 08/31/2022 in [facility] Office Visit from 03/03/2022 in [facility] Office Visit from 02/10/2022 in [facility] Office Visit from 11/25/2021 in [facility] Office Visit from 08/12/2021 in [facility]  PHQ-2 Total Score 4 6 5 2 5   PHQ-9 Total Score 17 14 13 8 16       Flowsheet Row ED from 05/05/2022 in [facility] Emergency Department at [facility] ED from 03/31/2021 in [facility] Emergency Department at [facility] ED from 03/24/2021 in [facility] at [facility]  C-SSRS RISK CATEGORY No Risk No Risk No Risk        Review of Systems:  Review of Systems  Musculoskeletal:  Negative for gait problem.  Neurological:  Negative for tremors.  Psychiatric/Behavioral:         Please refer to HPI    Medications: I have reviewed the patient's current medications.  Current Outpatient Medications  Medication Sig Dispense Refill   ALPRAZolam (XANAX) 1 MG tablet Take 1 tablet (1 mg total) by mouth 3 (three) times daily as needed for anxiety. 90 tablet 1   atorvastatin (LIPITOR) 80 MG tablet Take 1 tablet (80 mg total) by mouth daily. 90 tablet 1   Blood Glucose Monitoring Suppl (ONETOUCH VERIO) w/Device KIT USE TO CHECK BLOOD SUGAR THREE TIMES DAILY. 1 kit 0   Cholecalciferol (VITAMIN D3) 50 MCG (2000 UT) capsule Take 1 capsule (2,000 Units total) by mouth daily. (Patient not taking: Reported on 02/10/2022) 90 capsule 1  clindamycin (CLEOCIN) 2 % vaginal cream Place 1 Applicatorful vaginally at bedtime. 40 g 0   clonazePAM (KLONOPIN) 0.5 MG tablet Take 1 tablet (0.5 mg total) by mouth 3 (three) times daily as needed for anxiety. 90 tablet 2   Continuous Blood Gluc Receiver (DEXCOM G6 RECEIVER) DEVI 1 Bag by Does not apply route in the  morning, at noon, and at bedtime. 1 each 0   Continuous Blood Gluc Sensor (DEXCOM G6 SENSOR) MISC USE AS DIRECTED EVERY 10 DAYS 9 each 2   Continuous Blood Gluc Transmit (DEXCOM G6 TRANSMITTER) MISC USE AS DIRECTED OVER 90 DAYS 1 each 2   fluconazole (DIFLUCAN) 200 MG tablet Take 1 tablet (200 mg total) by mouth every 3 (three) days as needed. 2 tablet 0   glucose blood (ONETOUCH VERIO) test strip USE TO CHECK BLOOD SUGAR THREE TIMES DAILY. 100 each 2   hydrochlorothiazide (HYDRODIURIL) 25 MG tablet Take 1 tablet (25 mg total) by mouth daily. 90 tablet 1   hydrOXYzine (VISTARIL) 50 MG capsule TAKE 1 CAPSULE BY MOUTH THREE TIMES A DAY AS NEEDED 270 capsule 1   insulin aspart (NOVOLOG FLEXPEN) 100 UNIT/ML FlexPen INJECT up to 12u INTO THE SKIN 3 TIMES DAILY WITH MEALS per sliding scale: 200-250: 4 UNITS, 251-300: 6 UNITS, 301-350: 8 UNITS, 351-400: 10 UNITS,>400:12 UNITS AND CALL NP. Max daily dose: 36u 12 mL 1   Insulin Pen Needle (PEN NEEDLES) 32G X 5 MM MISC Use to inject Lantus (insulin) once daily. 100 each 6   LINZESS 145 MCG CAPS capsule Take 1 capsule (145 mcg total) by mouth daily as needed (constipation). 90 capsule 1   lisinopril (ZESTRIL) 20 MG tablet Take 1 tablet (20 mg total) by mouth daily. 90 tablet 1   metoprolol tartrate (LOPRESSOR) 25 MG tablet TAKE 1 TABLET BY MOUTH TWICE A DAY 180 tablet 1   norethindrone (MICRONOR) 0.35 MG tablet TAKE 1 TABLET BY MOUTH EVERY DAY 84 tablet 1   omeprazole (PRILOSEC) 20 MG capsule TAKE 1 CAPSULE BY MOUTH DAILY IF NEEDED FOR GASTRIC REFLUX 90 capsule 0   ondansetron (ZOFRAN-ODT) 4 MG disintegrating tablet TAKE 1 TABLET BY MOUTH EVERY 8 HOURS AS NEEDED FOR NAUSEA AND VOMITING 12 tablet 0   OneTouch Delica Lancets 33G MISC USE TO CHECK BLOOD SUGAR THREE TIMES DAILY. 100 each 2   No current facility-administered medications for this visit.    Medication Side Effects: None  Allergies: No Known Allergies  Past Medical History:  Diagnosis Date    Anxiety    Diabetes mellitus without complication (HCC)    Gastroparesis    Hypertension    Major depressive disorder     Past Medical History, Surgical history, Social history, and Family history were reviewed and updated as appropriate.   Please see review of systems for further details on the patient's review from today.   Objective:   Physical Exam:  There were no vitals taken for this visit.  Physical Exam Constitutional:      General: She is not in acute distress. Musculoskeletal:        General: No deformity.  Neurological:     Mental Status: She is alert and oriented to person, place, and time.     Coordination: Coordination normal.  Psychiatric:        Attention and Perception: Attention and perception normal. She does not perceive auditory or visual hallucinations.        Mood and Affect: Mood normal. Mood is not anxious or  depressed. Affect is not labile, blunt, angry or inappropriate.        Speech: Speech normal.        Behavior: Behavior normal.        Thought Content: Thought content normal. Thought content is not paranoid or delusional. Thought content does not include homicidal or suicidal ideation. Thought content does not include homicidal or suicidal plan.        Cognition and Memory: Cognition and memory normal.        Judgment: Judgment normal.     Comments: Insight intact     Lab Review:     Component Value Date/Time   NA 141 08/31/2022 0954   K 3.9 08/31/2022 0954   CL 103 08/31/2022 0954   CO2 20 08/31/2022 0954   GLUCOSE 109 (H) 08/31/2022 0954   GLUCOSE 210 (H) 05/05/2022 1330   BUN 12 08/31/2022 0954   CREATININE 0.78 08/31/2022 0954   CALCIUM 9.6 08/31/2022 0954   PROT 7.6 08/31/2022 0954   ALBUMIN 4.3 08/31/2022 0954   AST 77 (H) 08/31/2022 0954   ALT 78 (H) 08/31/2022 0954   ALKPHOS 86 08/31/2022 0954   BILITOT 0.3 08/31/2022 0954   GFRNONAA >60 05/05/2022 1330   GFRAA >60 10/10/2019 1441       Component Value Date/Time   WBC  10.5 08/31/2022 0954   WBC 9.5 05/05/2022 1330   RBC 4.28 08/31/2022 0954   RBC 3.84 (L) 05/05/2022 1330   HGB 13.9 08/31/2022 0954   HCT 41.0 08/31/2022 0954   PLT 249 08/31/2022 0954   MCV 96 08/31/2022 0954   MCH 32.5 08/31/2022 0954   MCH 32.3 05/05/2022 1330   MCHC 33.9 08/31/2022 0954   MCHC 34.3 05/05/2022 1330   RDW 12.6 08/31/2022 0954   LYMPHSABS 2.3 08/31/2022 0954   MONOABS 0.6 05/05/2022 1330   EOSABS 0.3 08/31/2022 0954   BASOSABS 0.1 08/31/2022 0954    No results found for: "POCLITH", "LITHIUM"   No results found for: "PHENYTOIN", "PHENOBARB", "VALPROATE", "CBMZ"   .res Assessment: Plan:    Plan:  PDMP reviewed  Hydroxyzine 50mg  at hs - using occasionally   Xanax 1mg  TID for severe anxiety  Gene sight testing reviewed   RTC 3 months  Patient was advised to contact office with any questions, adverse effects, or acute worsening in signs and symptoms.  Discussed potential benefits, risk, and side effects of benzodiazepines to include potential risk of tolerance and dependence, as well as possible drowsiness. Advised patient not to drive if experiencing drowsiness and to take lowest possible effective dose to minimize risk of dependence and tolerance.  There are no diagnoses linked to this encounter.   Please see After Visit Summary for patient specific instructions.  Future Appointments  Date Time Provider Department Center  12/07/2022  3:40 PM Karlis Cregg, Thereasa Solo, NP [facility] None    No orders of the defined types were placed in this encounter.   -------------------------------

## 2022-12-11 ENCOUNTER — Other Ambulatory Visit: Payer: Self-pay

## 2022-12-11 ENCOUNTER — Encounter (HOSPITAL_BASED_OUTPATIENT_CLINIC_OR_DEPARTMENT_OTHER): Payer: Self-pay | Admitting: Emergency Medicine

## 2022-12-11 ENCOUNTER — Emergency Department (HOSPITAL_BASED_OUTPATIENT_CLINIC_OR_DEPARTMENT_OTHER): Payer: 59

## 2022-12-11 ENCOUNTER — Emergency Department (HOSPITAL_BASED_OUTPATIENT_CLINIC_OR_DEPARTMENT_OTHER)
Admission: EM | Admit: 2022-12-11 | Discharge: 2022-12-11 | Disposition: A | Discharge status: Home / Self Care | Payer: 59 | Attending: Emergency Medicine | Admitting: Emergency Medicine

## 2022-12-11 DIAGNOSIS — E119 Type 2 diabetes mellitus without complications: Secondary | ICD-10-CM | POA: Insufficient documentation

## 2022-12-11 DIAGNOSIS — M779 Enthesopathy, unspecified: Secondary | ICD-10-CM | POA: Diagnosis not present

## 2022-12-11 DIAGNOSIS — Z79899 Other long term (current) drug therapy: Secondary | ICD-10-CM | POA: Insufficient documentation

## 2022-12-11 DIAGNOSIS — I10 Essential (primary) hypertension: Secondary | ICD-10-CM | POA: Insufficient documentation

## 2022-12-11 DIAGNOSIS — M67922 Unspecified disorder of synovium and tendon, left upper arm: Secondary | ICD-10-CM

## 2022-12-11 DIAGNOSIS — M25522 Pain in left elbow: Secondary | ICD-10-CM

## 2022-12-11 DIAGNOSIS — Z794 Long term (current) use of insulin: Secondary | ICD-10-CM | POA: Insufficient documentation

## 2022-12-11 MED ORDER — CYCLOBENZAPRINE HCL 10 MG PO TABS: 10.0000 mg | ORAL_TABLET | Freq: Two times a day (BID) | ORAL | 0 refills | Status: AC | PRN

## 2022-12-11 MED ORDER — OXYCODONE-ACETAMINOPHEN 5-325 MG PO TABS
1.0000 | ORAL_TABLET | ORAL | Status: DC | PRN
  Administered 2022-12-11: 1 via ORAL
  Filled 2022-12-11: qty 1

## 2022-12-11 MED ORDER — KETOROLAC TROMETHAMINE 60 MG/2ML IM SOLN
60.0000 mg | Freq: Once | INTRAMUSCULAR | Status: AC
  Administered 2022-12-11: 60 mg via INTRAMUSCULAR
  Filled 2022-12-11: qty 2

## 2022-12-11 MED ORDER — ONDANSETRON 4 MG PO TBDP
4.0000 mg | ORAL_TABLET | Freq: Once | ORAL | Status: AC | PRN
  Administered 2022-12-11: 4 mg via ORAL
  Filled 2022-12-11: qty 1

## 2022-12-11 MED ORDER — DEXAMETHASONE SODIUM PHOSPHATE 10 MG/ML IJ SOLN
10.0000 mg | Freq: Once | INTRAMUSCULAR | Status: AC
  Administered 2022-12-11: 10 mg via INTRAMUSCULAR
  Filled 2022-12-11: qty 1

## 2022-12-11 MED ORDER — LIDOCAINE 5 % EX PTCH: 1.0000 | MEDICATED_PATCH | CUTANEOUS | 0 refills | Status: AC

## 2022-12-11 MED ORDER — ELBOW COMPRESSION MISC: 1.0000 | Freq: Every day | 0 refills | Status: AC

## 2022-12-11 NOTE — Discharge Instructions (Addendum)
Recommend a elbow compression sleeve. May use sling as desired for rest, recommend taking arm out of sling to perform ROM exercises. Recommend follow up with Ortho/Sports Medicine and/or PCP--you will likely benefit from physical therapy as well as the elbow sleeve.

## 2022-12-11 NOTE — ED Provider Notes (Signed)
Gwendolyn Carrillo EMERGENCY DEPARTMENT AT [facility] Provider Note   CSN: [id number] Arrival date & time: 12/11/22  1259     History  No chief complaint on file.   Gwendolyn Carrillo is a 47 y.o. female.  HPI    47yo female with history of htn, DM, MDD, GAD, who presents with concern for left elbow pain.   Has been having left arm pain/elbow pain over several months.   Intense pain, hurts to move it.  Opening juice bottle will bring it on, closing car door.  Will keep her up at night, can't sleep due to pain.  Last night it was especially intense, pain going down forearm, mild into forearm and hand, more intense both sides of elbow all the way around.  Not tender, just bending and lifting makes it worse.  Once felt it radiating to the neck.  In sleep feels numb with pain, like a tingle, stiffness, pain.  Feels weak when trying to close car door or lift purse or juice.   Pain of bending to lift it.    Denies numbness, weakness, difficulty talking or walking, visual changes or facial droop.   Months ago on daughter's college tour was raining and as leaving slipped on the metal drain and landed with left arm extended, did not have pain until a month later, not sure if slept on it wrong, took motrin and it would go away at first.  Typing for work, lifting grandbabies, groceries  No chest pain, dyspnea, nausea, vomiting, sweating, abdominal pain   Tried icy hot/biofreeze/motrin  Extended family with heart disease in 27s Social smoking, no other etoh nor drugs      Past Medical History:  Diagnosis Date   Anxiety    Diabetes mellitus without complication (HCC)    Gastroparesis    Hypertension    Major depressive disorder      Home Medications Prior to Admission medications   Medication Sig Start Date End Date Taking? Authorizing Provider  cyclobenzaprine (FLEXERIL) 10 MG tablet Take 1 tablet (10 mg total) by mouth 2 (two) times daily as needed for muscle spasms.  12/11/22  Yes Alvira Monday, MD  Elastic Bandages & Supports (ELBOW COMPRESSION) MISC 1 Application by Does not apply route daily. 12/11/22  Yes Alvira Monday, MD  lidocaine (LIDODERM) 5 % Place 1 patch onto the skin daily. Remove & Discard patch within 12 hours or as directed by MD 12/11/22  Yes Alvira Monday, MD  ALPRAZolam Prudy Feeler) 1 MG tablet Take 1 tablet (1 mg total) by mouth 3 (three) times daily as needed for anxiety. 12/07/22   Mozingo, Thereasa Solo, NP  atorvastatin (LIPITOR) 80 MG tablet Take 1 tablet (80 mg total) by mouth daily. 09/08/22   Grayce Sessions, NP  Blood Glucose Monitoring Suppl (ONETOUCH VERIO) w/Device KIT USE TO CHECK BLOOD SUGAR THREE TIMES DAILY. 11/29/22   Hoy Register, MD  Cholecalciferol (VITAMIN D3) 50 MCG (2000 UT) capsule Take 1 capsule (2,000 Units total) by mouth daily. Patient not taking: Reported on 02/10/2022 11/02/21   Grayce Sessions, NP  clindamycin (CLEOCIN) 2 % vaginal cream Place 1 Applicatorful vaginally at bedtime. 05/15/22   Daphine Deutscher, Mary-Margaret, FNP  clonazePAM (KLONOPIN) 0.5 MG tablet Take 1 tablet (0.5 mg total) by mouth 3 (three) times daily as needed for anxiety. 07/22/22   Mozingo, Thereasa Solo, NP  Continuous Blood Gluc Receiver (DEXCOM G6 RECEIVER) DEVI 1 Bag by Does not apply route in the morning, at noon, and at  bedtime. 05/22/21   Grayce Sessions, NP  Continuous Blood Gluc Sensor (DEXCOM G6 SENSOR) MISC USE AS DIRECTED EVERY 10 DAYS 03/23/22   Grayce Sessions, NP  Continuous Blood Gluc Transmit (DEXCOM G6 TRANSMITTER) MISC USE AS DIRECTED OVER 90 DAYS 03/23/22   Grayce Sessions, NP  fluconazole (DIFLUCAN) 200 MG tablet Take 1 tablet (200 mg total) by mouth every 3 (three) days as needed. 03/24/22   Margaretann Loveless, PA-C  glucose blood (ONETOUCH VERIO) test strip USE TO CHECK BLOOD SUGAR THREE TIMES DAILY. 11/29/22   Hoy Register, MD  hydrochlorothiazide (HYDRODIURIL) 25 MG tablet Take 1 tablet (25 mg total)  by mouth daily. 08/31/22   Grayce Sessions, NP  hydrOXYzine (VISTARIL) 50 MG capsule TAKE 1 CAPSULE BY MOUTH THREE TIMES A DAY AS NEEDED 05/28/22   Mozingo, Thereasa Solo, NP  insulin aspart (NOVOLOG FLEXPEN) 100 UNIT/ML FlexPen INJECT up to 12u INTO THE SKIN 3 TIMES DAILY WITH MEALS per sliding scale: 200-250: 4 UNITS, 251-300: 6 UNITS, 301-350: 8 UNITS, 351-400: 10 UNITS,>400:12 UNITS AND CALL NP. Max daily dose: 36u 08/25/22   Hoy Register, MD  Insulin Pen Needle (PEN NEEDLES) 32G X 5 MM MISC Use to inject Lantus (insulin) once daily. 03/03/22   Grayce Sessions, NP  LINZESS 145 MCG CAPS capsule Take 1 capsule (145 mcg total) by mouth daily as needed (constipation). 08/31/22   Grayce Sessions, NP  lisinopril (ZESTRIL) 20 MG tablet Take 1 tablet (20 mg total) by mouth daily. 08/31/22   Grayce Sessions, NP  metoprolol tartrate (LOPRESSOR) 25 MG tablet TAKE 1 TABLET BY MOUTH TWICE A DAY 08/30/22   Patwardhan, Manish J, MD  norethindrone (MICRONOR) 0.35 MG tablet TAKE 1 TABLET BY MOUTH EVERY DAY 09/29/22   Wyline Beady A, NP  omeprazole (PRILOSEC) 20 MG capsule TAKE 1 CAPSULE BY MOUTH DAILY IF NEEDED FOR GASTRIC REFLUX 11/17/22   Hoy Register, MD  ondansetron (ZOFRAN-ODT) 4 MG disintegrating tablet TAKE 1 TABLET BY MOUTH EVERY 8 HOURS AS NEEDED FOR NAUSEA AND VOMITING 07/28/22   Grayce Sessions, NP  OneTouch Delica Lancets 33G MISC USE TO CHECK BLOOD SUGAR THREE TIMES DAILY. 11/29/22   Hoy Register, MD      Allergies    Patient has no known allergies.    Review of Systems   Review of Systems  Physical Exam Updated Vital Signs BP (!) 182/95 (BP Location: Right Arm)   Pulse 96   Temp 98.7 F (37.1 C)   Resp 17   SpO2 97%  Physical Exam Vitals and nursing note reviewed.  Constitutional:      General: She is not in acute distress.    Appearance: She is well-developed. She is not diaphoretic.  HENT:     Head: Normocephalic and atraumatic.  Eyes:     Conjunctiva/sclera:  Conjunctivae normal.  Cardiovascular:     Rate and Rhythm: Normal rate and regular rhythm.     Heart sounds: Normal heart sounds. No murmur heard.    No friction rub. No gallop.  Pulmonary:     Effort: Pulmonary effort is normal. No respiratory distress.     Breath sounds: Normal breath sounds. No wheezing or rales.  Abdominal:     General: There is no distension.     Palpations: Abdomen is soft.     Tenderness: There is no abdominal tenderness. There is no guarding.  Musculoskeletal:        General: Tenderness (lateral epicondyle tenderness, medial  epicondyle tenderness, no olecranon tenderness, normal strength and sensation, pain with wrist extension) present.     Cervical back: Normal range of motion.  Skin:    General: Skin is warm and dry.     Findings: No erythema or rash.  Neurological:     Mental Status: She is alert and oriented to person, place, and time.     Cranial Nerves: No cranial nerve deficit.     Sensory: No sensory deficit.     Motor: No weakness.     Coordination: Coordination normal.     Gait: Gait normal.     ED Results / Procedures / Treatments   Labs (all labs ordered are listed, but only abnormal results are displayed) Labs Reviewed - No data to display  EKG None  Radiology DG Elbow Complete Left  Result Date: 12/11/2022 CLINICAL DATA:  Left elbow pain after fall several months ago EXAM: LEFT ELBOW - COMPLETE 3+ VIEW COMPARISON:  None Available. FINDINGS: No fracture, joint effusion or dislocation. No focal osseous lesions. No significant arthropathy. Tiny enthesophyte superiorly at the medial epicondyle. IMPRESSION: No fracture, joint effusion or malalignment in the left elbow. Tiny enthesophyte superiorly at the medial epicondyle. Electronically Signed   By: Delbert Phenix M.D.   On: 12/11/2022 13:40    Procedures Procedures    Medications Ordered in ED Medications  oxyCODONE-acetaminophen (PERCOCET/ROXICET) 5-325 MG per tablet 1 tablet (1  tablet Oral Given 12/11/22 1326)  ondansetron (ZOFRAN-ODT) disintegrating tablet 4 mg (4 mg Oral Given 12/11/22 1330)  dexamethasone (DECADRON) injection 10 mg (10 mg Intramuscular Given 12/11/22 1534)  ketorolac (TORADOL) injection 60 mg (60 mg Intramuscular Given 12/11/22 1534)    ED Course/ Medical Decision Making/ A&P                                  47yo female with history of htn, DM, MDD, GAD, who presents with concern for left elbow pain.   X-ray of the left elbow shows no fracture, joint effusion or malalignment, does show tiny enthesophyte superiorly at the medial epicondyle.  Her exam is significant for both medial and lateral epicondyle tenderness which is significant, as well as pain with active wrist extension.  Clinically feel elbow tendinitis is most likely etiology of her pain.  Discussed possibility of obtaining other cardiac workup given her history of diabetes and hypertension, however given months of pain with tenderness on exam, exacerbation by movement, and no other chest pain, shortness of breath, nausea, have low clinical suspicion for ACS.  Normal neurologic exam, doubt CVA. She has normal bilateral pulses low suspicion for aortic dissection, acute arterial thrombus.  No asymmetric arm swelling and doubt DVT.  Cervical radiculopathy is on differential although with elbow tenderness have low suspicion for this.    Recommend elbow compression sleeve, given sling for comfort. Discussed risks of decadron of hyperglycemia and gave IM injection as well as toradol. Given rx for flexeril and lidoderm patch. Recommend ortho/sports follow up.  Patient discharged in stable condition with understanding of reasons to return.         Final Clinical Impression(s) / ED Diagnoses Final diagnoses:  Tendinopathy of elbow, left  Left elbow pain    Rx / DC Orders ED Discharge Orders          Ordered    Elastic Bandages & Supports (ELBOW COMPRESSION) MISC  Daily        12/11/22  1533    lidocaine (LIDODERM) 5 %  Every 24 hours        12/11/22 1533    cyclobenzaprine (FLEXERIL) 10 MG tablet  2 times daily PRN        12/11/22 1533              Alvira Monday, MD 12/11/22 1611

## 2022-12-11 NOTE — ED Notes (Signed)
Pt given discharge instructions and reviewed prescriptions. Opportunities given for questions. Pt verbalizes understanding. Stone,Heather R, RN 

## 2022-12-11 NOTE — ED Triage Notes (Signed)
Left elbow pain, has been hurting for months, but has not resolved and moving into arm.

## 2022-12-13 ENCOUNTER — Ambulatory Visit (INDEPENDENT_AMBULATORY_CARE_PROVIDER_SITE_OTHER): Payer: BC Managed Care – PPO | Admitting: Primary Care

## 2022-12-21 ENCOUNTER — Telehealth: Payer: Self-pay

## 2022-12-21 ENCOUNTER — Other Ambulatory Visit: Payer: Self-pay | Admitting: Adult Health

## 2022-12-21 DIAGNOSIS — G4700 Insomnia, unspecified: Secondary | ICD-10-CM

## 2022-12-21 DIAGNOSIS — F410 Panic disorder [episodic paroxysmal anxiety] without agoraphobia: Secondary | ICD-10-CM

## 2022-12-21 NOTE — Telephone Encounter (Signed)
FMLA paper work from [employer] was faxed over on 12/10/2022. After reviewing office notes from [place] on 12/07/22 nothing was noted about FMLA. Pt's last FMLA was faxed on 08/13/22 for pt out through 09/13/22. Pt did not follow up as advised after that time.   Contacted pt and she is requesting FMLA to be extended from 09/14/22-09/26/22. After discussing with [name] the paper work will not be completed due to pt not coming back for a follow up, therefore no documentation noted to justify why pt was out of work.

## 2022-12-24 ENCOUNTER — Telehealth: Payer: Self-pay | Admitting: Adult Health

## 2022-12-24 NOTE — Telephone Encounter (Signed)
[address] Emergency planning/management officer and Review company LVM @ 2:00p requesting a peer-to-peer review with Almira Coaster regarding the pt's disability claim.    Ref # [id number]  Call back to [phone]  Next appt 11/13

## 2022-12-29 ENCOUNTER — Telehealth (INDEPENDENT_AMBULATORY_CARE_PROVIDER_SITE_OTHER): Payer: Self-pay | Admitting: Primary Care

## 2022-12-29 NOTE — Telephone Encounter (Signed)
Disability Review Company called again. IF we are not doing a review, let us know and we can call them to cancel it.

## 2022-12-29 NOTE — Telephone Encounter (Signed)
Medication Refill - Medication: Her insurance no longer covers Novalog   She needs HUMALOG written 90 day supply for insurance purposes.   Along with pen needles  Has the patient contacted their pharmacy? Yes.   (Agent: If no, request that the patient contact the pharmacy for the refill. If patient does not wish to contact the pharmacy document the reason why and proceed with request.) (Agent: If yes, when and what did the pharmacy advise?)  Preferred Pharmacy (with phone number or street name):  [id number] - [place], [place] - [address] AT [facility] [address]  [address] [place] [address]  Phone: [phone] Fax: [phone]   Has the patient been seen for an appointment in the last year OR does the patient have an upcoming appointment? Yes.    Agent: Please be advised that RX refills may take up to 3 business days. We ask that you follow-up with your pharmacy.

## 2022-12-29 NOTE — Telephone Encounter (Signed)
Humalog not on current list, routing for approval.

## 2022-12-30 NOTE — Telephone Encounter (Signed)
Will forward to provider  

## 2023-01-07 ENCOUNTER — Other Ambulatory Visit: Payer: Self-pay | Admitting: Family Medicine

## 2023-01-07 ENCOUNTER — Other Ambulatory Visit: Payer: Self-pay | Admitting: Pharmacist

## 2023-01-07 DIAGNOSIS — E119 Type 2 diabetes mellitus without complications: Secondary | ICD-10-CM

## 2023-01-07 MED ORDER — INSULIN LISPRO (1 UNIT DIAL) 100 UNIT/ML (KWIKPEN): PEN_INJECTOR | SUBCUTANEOUS | 0 refills | Status: AC

## 2023-01-07 MED ORDER — PEN NEEDLES 32G X 5 MM MISC: 6 refills | Status: AC

## 2023-01-11 ENCOUNTER — Other Ambulatory Visit (INDEPENDENT_AMBULATORY_CARE_PROVIDER_SITE_OTHER): Payer: Self-pay | Admitting: Primary Care

## 2023-01-12 ENCOUNTER — Ambulatory Visit (INDEPENDENT_AMBULATORY_CARE_PROVIDER_SITE_OTHER): Payer: BC Managed Care – PPO | Admitting: Primary Care

## 2023-01-13 ENCOUNTER — Telehealth (INDEPENDENT_AMBULATORY_CARE_PROVIDER_SITE_OTHER): Payer: Self-pay

## 2023-01-13 NOTE — Telephone Encounter (Signed)
Contacted pt to schedule a bp check pt didn't answer lvm  If pt calls back please schedule nurse visit for bp check will need to be before the end of the month

## 2023-01-14 ENCOUNTER — Other Ambulatory Visit (INDEPENDENT_AMBULATORY_CARE_PROVIDER_SITE_OTHER): Payer: Self-pay | Admitting: Primary Care

## 2023-01-14 ENCOUNTER — Other Ambulatory Visit: Payer: Self-pay | Admitting: Cardiology

## 2023-01-14 ENCOUNTER — Other Ambulatory Visit: Payer: Self-pay | Admitting: Pharmacist

## 2023-01-14 DIAGNOSIS — R002 Palpitations: Secondary | ICD-10-CM

## 2023-01-14 DIAGNOSIS — I10 Essential (primary) hypertension: Secondary | ICD-10-CM

## 2023-01-14 MED ORDER — BASAGLAR KWIKPEN 100 UNIT/ML ~~LOC~~ SOPN: 15.0000 [IU] | PEN_INJECTOR | Freq: Every day | SUBCUTANEOUS | 0 refills | Status: AC

## 2023-02-04 ENCOUNTER — Other Ambulatory Visit (INDEPENDENT_AMBULATORY_CARE_PROVIDER_SITE_OTHER): Payer: Self-pay | Admitting: Primary Care

## 2023-02-06 ENCOUNTER — Other Ambulatory Visit: Payer: Self-pay | Admitting: Nurse Practitioner

## 2023-02-06 DIAGNOSIS — Z30011 Encounter for initial prescription of contraceptive pills: Secondary | ICD-10-CM

## 2023-02-07 NOTE — Telephone Encounter (Signed)
She'll have to request from her new PCP. Established with [facility] last week.

## 2023-02-07 NOTE — Telephone Encounter (Signed)
Medication refill request: micronor Last OV:  02-10-22 Next AEX: not scheduled. Message sent to scheduling department to schedule aex Last MMG (if hormonal medication request): 2019 care [facility] Refill authorized: last filled 10-19-22 supply with 1 refill. Please approve or deny as appropriate

## 2023-02-07 NOTE — Telephone Encounter (Signed)
Franky Macho could you take care of this for me

## 2023-02-07 NOTE — Telephone Encounter (Signed)
Requested medication (s) are due for refill today: alternative requested  Requested medication (s) are on the active medication list: yes  Last refill:  917/24  Future visit scheduled: no  Notes to clinic:  Pharmacy comment: Alternative Requested:[facility] PREFERS FREESTYLE LIBRE PRODUCTS.      Requested Prescriptions  Pending Prescriptions Disp Refills   Continuous Glucose Receiver (FREESTYLE LIBRE 3 READER) DEVI [Pharmacy Med Name: FREESTYLE LIBRE 3 READER]  0     Endocrinology: Diabetes - Testing Supplies Passed - 02/04/2023  8:51 PM      Passed - Valid encounter within last 12 months    Recent Outpatient Visits           5 months ago Type 2 diabetes mellitus without complication, without long-term current use of insulin (HCC)   [place] [facility] Grayce Sessions, NP   11 months ago Type 2 diabetes mellitus without complication, without long-term current use of insulin (HCC)   [place] [facility] Grayce Sessions, NP   1 year ago Type 2 diabetes mellitus without complication, without long-term current use of insulin (HCC)   [place] [facility] Grayce Sessions, NP   1 year ago Type 2 diabetes mellitus without complication, without long-term current use of insulin (HCC)   [place] [facility] Grayce Sessions, NP   1 year ago Type 2 diabetes mellitus without complication, without long-term current use of insulin (HCC)    [facility] Grayce Sessions, NP       Future Appointments             In 1 week [place], Buren Kos, MD [facility] at [facility], [place]

## 2023-02-08 ENCOUNTER — Encounter: Payer: Self-pay | Admitting: Gastroenterology

## 2023-02-11 ENCOUNTER — Other Ambulatory Visit: Payer: Self-pay | Admitting: Adult Health

## 2023-02-11 ENCOUNTER — Encounter: Payer: Self-pay | Admitting: Adult Health

## 2023-02-11 ENCOUNTER — Ambulatory Visit: Payer: 59 | Admitting: Adult Health

## 2023-02-11 DIAGNOSIS — F410 Panic disorder [episodic paroxysmal anxiety] without agoraphobia: Secondary | ICD-10-CM

## 2023-02-11 DIAGNOSIS — F331 Major depressive disorder, recurrent, moderate: Secondary | ICD-10-CM

## 2023-02-11 DIAGNOSIS — G4700 Insomnia, unspecified: Secondary | ICD-10-CM | POA: Diagnosis not present

## 2023-02-11 DIAGNOSIS — F411 Generalized anxiety disorder: Secondary | ICD-10-CM | POA: Diagnosis not present

## 2023-02-11 DIAGNOSIS — E782 Mixed hyperlipidemia: Secondary | ICD-10-CM

## 2023-02-11 NOTE — Progress Notes (Signed)
Gwendolyn Carrillo [id number] Sep 27, 1975 47 y.o.  Subjective:   Patient ID:  Gwendolyn Carrillo is a 47 y.o. (DOB 1975/09/21) female.  Chief Complaint: No chief complaint on file.   HPI [name] presents to the office today for follow-up of MDD, mixed obsessional thoughts, insomnia, panic attacks, and GAD.   Describes mood today as "not too good". Reports tearfulness - tearful throughout interview. Pleasant. Flat. Mood symptoms - reports depression and anxiety. Reports depression had gotten better, but is worse now. Denies irritability. Denies recent panic attacks. Reports worry, rumination, and over thinking. Concerned mood changes may be more hormonal and nobody is finding anything. Mood is lower. Stating "am I ever going to get better". Reports increased situational stressors - family. Reports working at [employer] since June - remote position. Reports her supervisor has asked her to take a 30 day leave of absence. Varying interest and motivation. Taking medications as prescribed.  Energy levels lower - "so low". Active, does not have a regular exercise routine.  Able to enjoy usual interests and activities. Single. Lives with companion. Has 4 children. Family local. Spending time with family.  Appetite adequate. Weight gain - 200 pounds.  Reports sleeping better some nights than others. Averages 5 to 6 hours. Reports focus and concentration difficulties. Completing minimal tasks. Managing some aspects of household - cleaning kitchen. Working for [employer] from home. Denies SI or HI.  Denies AH or VH. Denies self harm. Denies substance use.  Working with a therapist x 5 weeks.  Previous medications: Zoloft, Clonazepam   GAD-7    Flowsheet Row Office Visit from 08/31/2022 in [facility] Office Visit from 03/03/2022 in [facility] Office Visit from 11/25/2021 in [facility] Office Visit from 08/12/2021 in  [facility] Office Visit from 04/01/2021 in [facility]  Total GAD-7 Score 12 14 5 8 16       PHQ2-9    Flowsheet Row Office Visit from 08/31/2022 in [facility] Office Visit from 03/03/2022 in [facility] Office Visit from 02/10/2022 in [facility] Office Visit from 11/25/2021 in [facility] Office Visit from 08/12/2021 in [facility]  PHQ-2 Total Score 4 6 5 2 5   PHQ-9 Total Score 17 14 13 8 16       Flowsheet Row ED from 12/11/2022 in [facility] Emergency Department at [facility] ED from 05/05/2022 in [facility] Emergency Department at [facility] ED from 03/31/2021 in [facility] Emergency Department at [facility]  C-SSRS RISK CATEGORY No Risk No Risk No Risk        Review of Systems:  Review of Systems  Musculoskeletal:  Negative for gait problem.  Neurological:  Negative for tremors.  Psychiatric/Behavioral:         Please refer to HPI    Medications: I have reviewed the patient's current medications.  Current Outpatient Medications  Medication Sig Dispense Refill   ALPRAZolam (XANAX) 1 MG tablet Take 1 tablet (1 mg total) by mouth 3 (three) times daily as needed for anxiety. 90 tablet 0   atorvastatin (LIPITOR) 80 MG tablet Take 1 tablet (80 mg total) by mouth daily. 90 tablet 1   Blood Glucose Monitoring Suppl (ONETOUCH VERIO) w/Device KIT USE TO CHECK BLOOD SUGAR THREE TIMES DAILY. 1 kit 0   Cholecalciferol (VITAMIN D3) 50 MCG (  2000 UT) capsule Take 1 capsule (2,000 Units total) by mouth daily. (Patient not taking: Reported on 02/10/2022) 90 capsule 1   clindamycin (CLEOCIN) 2 % vaginal cream Place 1 Applicatorful vaginally at bedtime. 40 g 0   clonazePAM (KLONOPIN) 0.5 MG tablet Take 1 tablet (0.5 mg total) by mouth 3 (three) times daily as needed for anxiety.  90 tablet 2   Continuous Blood Gluc Receiver (DEXCOM G6 RECEIVER) DEVI 1 Bag by Does not apply route in the morning, at noon, and at bedtime. 1 each 0   Continuous Glucose Sensor (DEXCOM G6 SENSOR) MISC USE AS DIRECTED EVERY 10 DAYS 9 each 2   Continuous Glucose Transmitter (DEXCOM G6 TRANSMITTER) MISC USE AS DIRECTED OVER 90 DAYS 1 each 2   cyclobenzaprine (FLEXERIL) 10 MG tablet Take 1 tablet (10 mg total) by mouth 2 (two) times daily as needed for muscle spasms. 12 tablet 0   Elastic Bandages & Supports (ELBOW COMPRESSION) MISC 1 Application by Does not apply route daily. 1 each 0   fluconazole (DIFLUCAN) 200 MG tablet Take 1 tablet (200 mg total) by mouth every 3 (three) days as needed. 2 tablet 0   glucose blood (ONETOUCH VERIO) test strip USE TO CHECK BLOOD SUGAR THREE TIMES DAILY. 100 each 2   hydrochlorothiazide (HYDRODIURIL) 25 MG tablet Take 1 tablet (25 mg total) by mouth daily. 90 tablet 1   hydrOXYzine (VISTARIL) 50 MG capsule TAKE 1 CAPSULE BY MOUTH THREE TIMES A DAY AS NEEDED 270 capsule 1   Insulin Glargine (BASAGLAR KWIKPEN) 100 UNIT/ML Inject 15 Units into the skin daily. 15 mL 0   insulin lispro (HUMALOG KWIKPEN) 100 UNIT/ML KwikPen INJECT up to 12u INTO THE SKIN 3 TIMES DAILY WITH MEALS per sliding scale: 200-250: 4 UNITS, 251-300: 6 UNITS, 301-350: 8 UNITS, 351-400: 10 UNITS,>400:12 UNITS AND CALL NP. Max daily dose: 36u 30 mL 0   Insulin Pen Needle (PEN NEEDLES) 32G X 5 MM MISC Use to inject sliding scale Humalog three times daily. 100 each 6   lidocaine (LIDODERM) 5 % Place 1 patch onto the skin daily. Remove & Discard patch within 12 hours or as directed by MD 30 patch 0   LINZESS 145 MCG CAPS capsule Take 1 capsule (145 mcg total) by mouth daily as needed (constipation). 90 capsule 1   lisinopril (ZESTRIL) 20 MG tablet Take 1 tablet (20 mg total) by mouth daily. 90 tablet 1   metoprolol tartrate (LOPRESSOR) 25 MG tablet TAKE 1 TABLET BY MOUTH TWICE A DAY 180 tablet 1    norethindrone (MICRONOR) 0.35 MG tablet TAKE 1 TABLET BY MOUTH EVERY DAY 84 tablet 0   omeprazole (PRILOSEC) 20 MG capsule TAKE 1 CAPSULE BY MOUTH DAILY IF NEEDED FOR GASTRIC REFLUX 90 capsule 0   ondansetron (ZOFRAN-ODT) 4 MG disintegrating tablet TAKE 1 TABLET BY MOUTH EVERY 8 HOURS AS NEEDED FOR NAUSEA AND VOMITING 12 tablet 0   OneTouch Delica Lancets 33G MISC USE TO CHECK BLOOD SUGAR THREE TIMES DAILY. 100 each 2   No current facility-administered medications for this visit.    Medication Side Effects: None  Allergies: No Known Allergies  Past Medical History:  Diagnosis Date   Anxiety    Diabetes mellitus without complication (HCC)    Gastroparesis    Hypertension    Major depressive disorder     Past Medical History, Surgical history, Social history, and Family history were reviewed and updated as appropriate.   Please see review of systems for further  details on the patient's review from today.   Objective:   Physical Exam:  There were no vitals taken for this visit.  Physical Exam Constitutional:      General: She is not in acute distress. Musculoskeletal:        General: No deformity.  Neurological:     Mental Status: She is alert and oriented to person, place, and time.     Coordination: Coordination normal.  Psychiatric:        Attention and Perception: Attention and perception normal. She does not perceive auditory or visual hallucinations.        Mood and Affect: Affect is not labile, blunt, angry or inappropriate.        Speech: Speech normal.        Behavior: Behavior normal.        Thought Content: Thought content normal. Thought content is not paranoid or delusional. Thought content does not include homicidal or suicidal ideation. Thought content does not include homicidal or suicidal plan.        Cognition and Memory: Cognition and memory normal.        Judgment: Judgment normal.     Comments: Insight intact     Lab Review:     Component Value  Date/Time   NA 141 08/31/2022 0954   K 3.9 08/31/2022 0954   CL 103 08/31/2022 0954   CO2 20 08/31/2022 0954   GLUCOSE 109 (H) 08/31/2022 0954   GLUCOSE 210 (H) 05/05/2022 1330   BUN 12 08/31/2022 0954   CREATININE 0.78 08/31/2022 0954   CALCIUM 9.6 08/31/2022 0954   PROT 7.6 08/31/2022 0954   ALBUMIN 4.3 08/31/2022 0954   AST 77 (H) 08/31/2022 0954   ALT 78 (H) 08/31/2022 0954   ALKPHOS 86 08/31/2022 0954   BILITOT 0.3 08/31/2022 0954   GFRNONAA >60 05/05/2022 1330   GFRAA >60 10/10/2019 1441       Component Value Date/Time   WBC 10.5 08/31/2022 0954   WBC 9.5 05/05/2022 1330   RBC 4.28 08/31/2022 0954   RBC 3.84 (L) 05/05/2022 1330   HGB 13.9 08/31/2022 0954   HCT 41.0 08/31/2022 0954   PLT 249 08/31/2022 0954   MCV 96 08/31/2022 0954   MCH 32.5 08/31/2022 0954   MCH 32.3 05/05/2022 1330   MCHC 33.9 08/31/2022 0954   MCHC 34.3 05/05/2022 1330   RDW 12.6 08/31/2022 0954   LYMPHSABS 2.3 08/31/2022 0954   MONOABS 0.6 05/05/2022 1330   EOSABS 0.3 08/31/2022 0954   BASOSABS 0.1 08/31/2022 0954    No results found for: "POCLITH", "LITHIUM"   No results found for: "PHENYTOIN", "PHENOBARB", "VALPROATE", "CBMZ"   .res Assessment: Plan:    Plan:  PDMP reviewed  Patient totally disabled and unable to work starting 02/11/2023 through 03/13/2023.    Xanax 1mg  TID as needed  Unable to take antidepressants.  Gene sight testing reviewed   RTC 4 weeks  Patient was advised to contact office with any questions, adverse effects, or acute worsening in signs and symptoms.  Discussed potential benefits, risk, and side effects of benzodiazepines to include potential risk of tolerance and dependence, as well as possible drowsiness. Advised patient not to drive if experiencing drowsiness and to take lowest possible effective dose to minimize risk of dependence and tolerance.  There are no diagnoses linked to this encounter.   Please see After Visit Summary for patient  specific instructions.  Future Appointments  Date Time Provider Department Center  02/16/2023  9:10 AM de [place], Raymond J, MD [facility]  03/09/2023  1:20 PM Pleas Carneal, Thereasa Solo, NP [facility] None  05/12/2023  3:20 PM Danis, Andreas Blower, MD [facility]    No orders of the defined types were placed in this encounter.   -------------------------------

## 2023-02-16 ENCOUNTER — Ambulatory Visit (HOSPITAL_BASED_OUTPATIENT_CLINIC_OR_DEPARTMENT_OTHER): Payer: 59 | Admitting: Family Medicine

## 2023-02-17 ENCOUNTER — Encounter: Payer: Self-pay | Admitting: Adult Health

## 2023-02-18 ENCOUNTER — Telehealth: Payer: 59 | Admitting: Nurse Practitioner

## 2023-02-18 DIAGNOSIS — B3731 Acute candidiasis of vulva and vagina: Secondary | ICD-10-CM | POA: Diagnosis not present

## 2023-02-18 MED ORDER — FLUCONAZOLE 150 MG PO TABS: 150.0000 mg | ORAL_TABLET | Freq: Once | ORAL | 0 refills | Status: AC

## 2023-02-18 NOTE — Progress Notes (Signed)

## 2023-02-26 ENCOUNTER — Other Ambulatory Visit: Payer: Self-pay | Admitting: Adult Health

## 2023-02-26 DIAGNOSIS — F410 Panic disorder [episodic paroxysmal anxiety] without agoraphobia: Secondary | ICD-10-CM

## 2023-02-26 DIAGNOSIS — G4700 Insomnia, unspecified: Secondary | ICD-10-CM

## 2023-02-28 DIAGNOSIS — Z0289 Encounter for other administrative examinations: Secondary | ICD-10-CM

## 2023-03-01 NOTE — Telephone Encounter (Signed)
Patient said most days she takes TID. Database and RF request show last filled 8/20. She has FU 11/13.

## 2023-03-02 ENCOUNTER — Telehealth: Payer: 59 | Admitting: Family Medicine

## 2023-03-02 DIAGNOSIS — B3731 Acute candidiasis of vulva and vagina: Secondary | ICD-10-CM

## 2023-03-02 NOTE — Progress Notes (Signed)
Because you were treated for yeast on 10/25 and given Diflucan with your antibiotics- we feel your condition warrants further evaluation and it is recommend that you be seen in a face to face visit.   NOTE: There will be NO CHARGE for this eVisit   If you are having a true medical emergency please call 911.

## 2023-03-09 ENCOUNTER — Ambulatory Visit: Payer: 59 | Admitting: Adult Health

## 2023-03-09 ENCOUNTER — Encounter: Payer: Self-pay | Admitting: Adult Health

## 2023-03-09 DIAGNOSIS — F411 Generalized anxiety disorder: Secondary | ICD-10-CM

## 2023-03-09 DIAGNOSIS — F331 Major depressive disorder, recurrent, moderate: Secondary | ICD-10-CM

## 2023-03-09 DIAGNOSIS — F410 Panic disorder [episodic paroxysmal anxiety] without agoraphobia: Secondary | ICD-10-CM | POA: Diagnosis not present

## 2023-03-09 DIAGNOSIS — G4700 Insomnia, unspecified: Secondary | ICD-10-CM

## 2023-03-09 NOTE — Progress Notes (Signed)
[name] [id number] [birth year] 47 y.o.  Subjective:   Patient ID:  Gwendolyn Carrillo is a 47 y.o. (DOB Aug 18, 1975) female.  Chief Complaint: No chief complaint on file.   HPI GERELENE SEJA presents to the office today for follow-up of MDD, mixed obsessional thoughts, insomnia, panic attacks, and GAD.   Describes mood today as "ok". Reports decreased tearfulness. Pleasant. Flat. Mood symptoms - reports decreased depression and anxiety. Denies irritability. Denies recent panic attacks. Reports decreased worry, rumination, and over thinking. Reports increased situational stressors - financial and family. Mood has improved. Stating "I'm feeling a little better". Reports taking Xanax as needed. Reports working at [employer] since June - remote position - plans to return to work on 03/14/2023. Varying interest and motivation. Taking medications as prescribed.  Energy levels lower. Active, does not have a regular exercise routine.  Able to enjoy usual interests and activities. Single. Lives with companion. Has 4 children. Family local. Spending time with family.  Appetite adequate. Weight gain - 200 pounds.  Reports sleeping better some nights than others. Averages 5 to 6 hours. Reports focus and concentration difficulties - worried. Completing minimal tasks. Managing some aspects of household - cleaning kitchen. Working for [employer] from home. Denies SI or HI.  Denies AH or VH. Denies self harm. Denies substance use.  Working with a therapist once a week.  Previous medications: Zoloft, Clonazepam   GAD-7    Flowsheet Row Office Visit from 08/31/2022 in [facility] Office Visit from 03/03/2022 in [facility] Office Visit from 11/25/2021 in [facility] Office Visit from 08/12/2021 in [facility] Office Visit from 04/01/2021 in [facility]  Total GAD-7  Score 12 14 5 8 16       PHQ2-9    Flowsheet Row Office Visit from 08/31/2022 in [facility] Office Visit from 03/03/2022 in [facility] Office Visit from 02/10/2022 in [facility] Office Visit from 11/25/2021 in [facility] Office Visit from 08/12/2021 in [facility]  PHQ-2 Total Score 4 6 5 2 5   PHQ-9 Total Score 17 14 13 8 16       Flowsheet Row ED from 12/11/2022 in [facility] Emergency Department at [facility] ED from 05/05/2022 in [facility] Emergency Department at [facility] ED from 03/31/2021 in [facility] Emergency Department at [facility]  C-SSRS RISK CATEGORY No Risk No Risk No Risk        Review of Systems:  Review of Systems  Musculoskeletal:  Negative for gait problem.  Neurological:  Negative for tremors.  Psychiatric/Behavioral:         Please refer to HPI    Medications: I have reviewed the patient's current medications.  Current Outpatient Medications  Medication Sig Dispense Refill   ALPRAZolam (XANAX) 1 MG tablet TAKE 1 TABLET BY MOUTH 3 TIMES DAILY AS NEEDED FOR ANXIETY. 24 tablet 0   atorvastatin (LIPITOR) 80 MG tablet Take 1 tablet (80 mg total) by mouth daily. 90 tablet 1   Blood Glucose Monitoring Suppl (ONETOUCH VERIO) w/Device KIT USE TO CHECK BLOOD SUGAR THREE TIMES DAILY. 1 kit 0   Cholecalciferol (VITAMIN D3) 50 MCG (2000 UT) capsule Take 1 capsule (2,000 Units total) by mouth daily. (Patient not taking: Reported on 02/10/2022) 90 capsule 1   clindamycin (CLEOCIN) 2 % vaginal cream Place  1 Applicatorful vaginally at bedtime. 40 g 0   clonazePAM (KLONOPIN) 0.5 MG tablet Take 1 tablet (0.5 mg total) by mouth 3 (three) times daily as needed for anxiety. 90 tablet 2   Continuous Blood Gluc Receiver (DEXCOM G6 RECEIVER) DEVI 1 Bag by Does not apply route in the morning, at noon, and at bedtime. 1  each 0   Continuous Glucose Sensor (DEXCOM G6 SENSOR) MISC USE AS DIRECTED EVERY 10 DAYS 9 each 2   Continuous Glucose Transmitter (DEXCOM G6 TRANSMITTER) MISC USE AS DIRECTED OVER 90 DAYS 1 each 2   cyclobenzaprine (FLEXERIL) 10 MG tablet Take 1 tablet (10 mg total) by mouth 2 (two) times daily as needed for muscle spasms. 12 tablet 0   Elastic Bandages & Supports (ELBOW COMPRESSION) MISC 1 Application by Does not apply route daily. 1 each 0   fluconazole (DIFLUCAN) 200 MG tablet Take 1 tablet (200 mg total) by mouth every 3 (three) days as needed. 2 tablet 0   glucose blood (ONETOUCH VERIO) test strip USE TO CHECK BLOOD SUGAR THREE TIMES DAILY. 100 each 2   hydrochlorothiazide (HYDRODIURIL) 25 MG tablet Take 1 tablet (25 mg total) by mouth daily. 90 tablet 1   hydrOXYzine (VISTARIL) 50 MG capsule TAKE 1 CAPSULE BY MOUTH THREE TIMES A DAY AS NEEDED 270 capsule 1   Insulin Glargine (BASAGLAR KWIKPEN) 100 UNIT/ML Inject 15 Units into the skin daily. 15 mL 0   insulin lispro (HUMALOG KWIKPEN) 100 UNIT/ML KwikPen INJECT up to 12u INTO THE SKIN 3 TIMES DAILY WITH MEALS per sliding scale: 200-250: 4 UNITS, 251-300: 6 UNITS, 301-350: 8 UNITS, 351-400: 10 UNITS,>400:12 UNITS AND CALL NP. Max daily dose: 36u 30 mL 0   Insulin Pen Needle (PEN NEEDLES) 32G X 5 MM MISC Use to inject sliding scale Humalog three times daily. 100 each 6   lidocaine (LIDODERM) 5 % Place 1 patch onto the skin daily. Remove & Discard patch within 12 hours or as directed by MD 30 patch 0   LINZESS 145 MCG CAPS capsule Take 1 capsule (145 mcg total) by mouth daily as needed (constipation). 90 capsule 1   lisinopril (ZESTRIL) 20 MG tablet Take 1 tablet (20 mg total) by mouth daily. 90 tablet 1   metoprolol tartrate (LOPRESSOR) 25 MG tablet TAKE 1 TABLET BY MOUTH TWICE A DAY 180 tablet 1   norethindrone (MICRONOR) 0.35 MG tablet TAKE 1 TABLET BY MOUTH EVERY DAY 84 tablet 0   omeprazole (PRILOSEC) 20 MG capsule TAKE 1 CAPSULE BY MOUTH  DAILY IF NEEDED FOR GASTRIC REFLUX 90 capsule 0   ondansetron (ZOFRAN-ODT) 4 MG disintegrating tablet TAKE 1 TABLET BY MOUTH EVERY 8 HOURS AS NEEDED FOR NAUSEA AND VOMITING 12 tablet 0   OneTouch Delica Lancets 33G MISC USE TO CHECK BLOOD SUGAR THREE TIMES DAILY. 100 each 2   No current facility-administered medications for this visit.    Medication Side Effects: None  Allergies: No Known Allergies  Past Medical History:  Diagnosis Date   Anxiety    Diabetes mellitus without complication (HCC)    Gastroparesis    Hypertension    Major depressive disorder     Past Medical History, Surgical history, Social history, and Family history were reviewed and updated as appropriate.   Please see review of systems for further details on the patient's review from today.   Objective:   Physical Exam:  There were no vitals taken for this visit.  Physical Exam  Lab Review:  Component Value Date/Time   NA 141 08/31/2022 0954   K 3.9 08/31/2022 0954   CL 103 08/31/2022 0954   CO2 20 08/31/2022 0954   GLUCOSE 109 (H) 08/31/2022 0954   GLUCOSE 210 (H) 05/05/2022 1330   BUN 12 08/31/2022 0954   CREATININE 0.78 08/31/2022 0954   CALCIUM 9.6 08/31/2022 0954   PROT 7.6 08/31/2022 0954   ALBUMIN 4.3 08/31/2022 0954   AST 77 (H) 08/31/2022 0954   ALT 78 (H) 08/31/2022 0954   ALKPHOS 86 08/31/2022 0954   BILITOT 0.3 08/31/2022 0954   GFRNONAA >60 05/05/2022 1330   GFRAA >60 10/10/2019 1441       Component Value Date/Time   WBC 10.5 08/31/2022 0954   WBC 9.5 05/05/2022 1330   RBC 4.28 08/31/2022 0954   RBC 3.84 (L) 05/05/2022 1330   HGB 13.9 08/31/2022 0954   HCT 41.0 08/31/2022 0954   PLT 249 08/31/2022 0954   MCV 96 08/31/2022 0954   MCH 32.5 08/31/2022 0954   MCH 32.3 05/05/2022 1330   MCHC 33.9 08/31/2022 0954   MCHC 34.3 05/05/2022 1330   RDW 12.6 08/31/2022 0954   LYMPHSABS 2.3 08/31/2022 0954   MONOABS 0.6 05/05/2022 1330   EOSABS 0.3 08/31/2022 0954   BASOSABS  0.1 08/31/2022 0954    No results found for: "POCLITH", "LITHIUM"   No results found for: "PHENYTOIN", "PHENOBARB", "VALPROATE", "CBMZ"   .res Assessment: Plan:    Plan:  PDMP reviewed  Patient may return to work without restrictions on 03/14/2023.    Decrease Xanax 1mg  TID to BID as needed  Unable to take antidepressants.  Gene sight testing reviewed   RTC 4 weeks  Patient was advised to contact office with any questions, adverse effects, or acute worsening in signs and symptoms.  Discussed potential benefits, risk, and side effects of benzodiazepines to include potential risk of tolerance and dependence, as well as possible drowsiness. Advised patient not to drive if experiencing drowsiness and to take lowest possible effective dose to minimize risk of dependence and tolerance.  There are no diagnoses linked to this encounter.   Please see After Visit Summary for patient specific instructions.  Future Appointments  Date Time Provider Department Center  03/09/2023  1:20 PM Caleigha Zale, Thereasa Solo, NP CP-CP None  05/12/2023  3:20 PM Danis, Andreas Blower, MD [facility]    No orders of the defined types were placed in this encounter.   -------------------------------

## 2023-03-10 ENCOUNTER — Ambulatory Visit: Payer: 59 | Admitting: Adult Health

## 2023-03-14 ENCOUNTER — Other Ambulatory Visit: Payer: Self-pay | Admitting: Adult Health

## 2023-03-14 DIAGNOSIS — F410 Panic disorder [episodic paroxysmal anxiety] without agoraphobia: Secondary | ICD-10-CM

## 2023-03-14 DIAGNOSIS — G4700 Insomnia, unspecified: Secondary | ICD-10-CM

## 2023-03-17 ENCOUNTER — Other Ambulatory Visit: Payer: Self-pay | Admitting: Adult Health

## 2023-03-17 DIAGNOSIS — G4700 Insomnia, unspecified: Secondary | ICD-10-CM

## 2023-03-17 DIAGNOSIS — F410 Panic disorder [episodic paroxysmal anxiety] without agoraphobia: Secondary | ICD-10-CM

## 2023-03-17 NOTE — Telephone Encounter (Signed)
This rx was sent 2 weeks ago, its too early to send to the pharmacy. I should request a refusal, right?

## 2023-03-21 ENCOUNTER — Other Ambulatory Visit: Payer: Self-pay

## 2023-03-21 DIAGNOSIS — F410 Panic disorder [episodic paroxysmal anxiety] without agoraphobia: Secondary | ICD-10-CM

## 2023-03-21 DIAGNOSIS — G4700 Insomnia, unspecified: Secondary | ICD-10-CM

## 2023-03-21 MED ORDER — ALPRAZOLAM 1 MG PO TABS
1.0000 mg | ORAL_TABLET | Freq: Two times a day (BID) | ORAL | 0 refills | Status: DC | PRN
Start: 2023-03-21 — End: 2023-04-04

## 2023-04-04 ENCOUNTER — Telehealth: Payer: Self-pay | Admitting: Adult Health

## 2023-04-04 ENCOUNTER — Other Ambulatory Visit: Payer: Self-pay

## 2023-04-04 DIAGNOSIS — G4700 Insomnia, unspecified: Secondary | ICD-10-CM

## 2023-04-04 DIAGNOSIS — F410 Panic disorder [episodic paroxysmal anxiety] without agoraphobia: Secondary | ICD-10-CM

## 2023-04-04 NOTE — Telephone Encounter (Signed)
Pended #60 [facility]olam to [facility]. She did not have an appt, which is why a lesser dose was sent the last 2 times.

## 2023-04-04 NOTE — Telephone Encounter (Signed)
Patient called in regarding Alprazolam prescription. She states she only received 15 tablets for last fill can't afford to keep going to pharmacy. She usually gets the Alprazolam for a 90 day supply  and needs new prescription for that. PH: [phone] Appt 2/13. [facility]acy [facility] [address] [place], [place]

## 2023-04-05 MED ORDER — ALPRAZOLAM 1 MG PO TABS
1.0000 mg | ORAL_TABLET | Freq: Two times a day (BID) | ORAL | 0 refills | Status: DC | PRN
Start: 2023-04-05 — End: 2023-04-06

## 2023-04-05 NOTE — Telephone Encounter (Signed)
[facility] [address] [facility]  Please cancel at [facility] and resubmit

## 2023-04-06 ENCOUNTER — Other Ambulatory Visit: Payer: Self-pay | Admitting: Adult Health

## 2023-04-06 ENCOUNTER — Other Ambulatory Visit: Payer: Self-pay

## 2023-04-06 DIAGNOSIS — G4700 Insomnia, unspecified: Secondary | ICD-10-CM

## 2023-04-06 DIAGNOSIS — F410 Panic disorder [episodic paroxysmal anxiety] without agoraphobia: Secondary | ICD-10-CM

## 2023-04-06 MED ORDER — ALPRAZOLAM 1 MG PO TABS
1.0000 mg | ORAL_TABLET | Freq: Two times a day (BID) | ORAL | 0 refills | Status: DC | PRN
Start: 2023-04-06 — End: 2023-04-15

## 2023-04-06 NOTE — Telephone Encounter (Signed)
Pt called at 10:49a.  Med[facility] not [facility].  She is asking for it to be done asap since it was sent to [facility] for some reason (not pt's request, she said).  She is out meds.  Pls send to [facility].

## 2023-04-06 NOTE — Telephone Encounter (Signed)
[facility] [place] and [facility] at [facility].

## 2023-04-15 ENCOUNTER — Other Ambulatory Visit: Payer: Self-pay

## 2023-04-15 ENCOUNTER — Other Ambulatory Visit: Payer: Self-pay | Admitting: Adult Health

## 2023-04-15 DIAGNOSIS — G4700 Insomnia, unspecified: Secondary | ICD-10-CM

## 2023-04-15 DIAGNOSIS — F410 Panic disorder [episodic paroxysmal anxiety] without agoraphobia: Secondary | ICD-10-CM

## 2023-04-15 MED ORDER — ALPRAZOLAM 1 MG PO TABS
1.0000 mg | ORAL_TABLET | Freq: Two times a day (BID) | ORAL | 0 refills | Status: DC | PRN
Start: 2023-04-15 — End: 2023-05-19

## 2023-04-15 NOTE — Telephone Encounter (Signed)
Pended [facility]razolam [facility] to [facility].

## 2023-04-15 NOTE — Telephone Encounter (Signed)
Patient called back regarding previous messages. She states that prescription was sent to correct pharmacy but for only 15 days "can't afford to keep going back every 15 days." Needs prescription sent for 30 days instead. She would like a rtc [phone]

## 2023-05-12 ENCOUNTER — Ambulatory Visit: Payer: 59 | Admitting: Gastroenterology

## 2023-05-12 NOTE — Progress Notes (Deleted)
[facility] Consult Note:  History: Gwendolyn Carrillo 05/12/2023  Referring provider: Grayce Sessions, NP  Reason for consult/chief complaint: No chief complaint on file.   Subjective  Prior history:     Discussed the use of AI scribe software for clinical note transcription with the patient, who gave verbal consent to proceed.  History of Present Illness            ***   ROS:  Review of Systems   Past Medical History: Past Medical History:  Diagnosis Date   Anxiety    Diabetes mellitus without complication (HCC)    Gastroparesis    Hypertension    Major depressive disorder      Past Surgical History: Past Surgical History:  Procedure Laterality Date   APPENDECTOMY     CESAREAN SECTION     COLONOSCOPY     ENDOMETRIAL ABLATION     HERNIA REPAIR     TUBAL LIGATION     UPPER GASTROINTESTINAL ENDOSCOPY       Family History: Family History  Problem Relation Age of Onset   Hyperlipidemia Mother    Hypertension Mother    Hyperlipidemia Father    Hypertension Father    Diabetes Father    Diabetes Sister    Blindness Sister    Hypertension Brother    Diabetes Brother    Stroke Brother    Diabetes Maternal Aunt    Diabetes Maternal Grandmother    Hypertension Maternal Grandmother    Heart disease Maternal Grandmother    Diabetes Paternal Grandmother    Hypertension Paternal Grandmother    Heart disease Paternal Grandmother     Social History: Social History   Socioeconomic History   Marital status: Single    Spouse name: Not on file   Number of children: Not on file   Years of education: Not on file   Highest education level: Not on file  Occupational History   Not on file  Tobacco Use   Smoking status: Some Days    Current packs/day: 0.25    Types: Cigarettes   Smokeless tobacco: Never   Tobacco comments:    "I smoke when I'm drinking"  Vaping Use   Vaping status: Never Used  Substance and Sexual  Activity   Alcohol use: Yes    Comment: occ   Drug use: Not Currently    Types: Marijuana   Sexual activity: Not Currently    Partners: Male    Comment: First IC <16, Partners >5, Hx of CT+, GC+  Other Topics Concern   Not on file  Social History Narrative   Not on file   Social Drivers of Health   Financial Resource Strain: Not on file  Food Insecurity: Low Risk  (04/18/2023)   Received from [facility]   Hunger Vital Sign    Worried About Running Out of Food in the Last Year: Never true    Ran Out of Food in the Last Year: Never true  Transportation Needs: No Transportation Needs (04/18/2023)   Received from [facility]    In the past 12 months, has lack of reliable transportation kept you from medical appointments, meetings, work or from getting things needed for daily living? : No  Physical Activity: Not on file  Stress: Not on file  Social Connections: Unknown (08/25/2021)   Received from [facility]   Social Network    Social Network: Not on file    Allergies:  No Known Allergies  Outpatient Meds: Current Outpatient Medications  Medication Sig Dispense Refill   JARDIANCE 10 MG TABS tablet Take 10 mg by mouth daily.     metFORMIN (GLUCOPHAGE) 500 MG tablet Take 500 mg by mouth daily with breakfast.     telmisartan (MICARDIS) 80 MG tablet Take 1 tablet by mouth daily.     ALPRAZolam (XANAX) 1 MG tablet Take 1 tablet (1 mg total) by mouth 2 (two) times daily as needed for anxiety. 60 tablet 0   atorvastatin (LIPITOR) 80 MG tablet Take 1 tablet (80 mg total) by mouth daily. 90 tablet 1   Blood Glucose Monitoring Suppl (ONETOUCH VERIO) w/Device KIT USE TO CHECK BLOOD SUGAR THREE TIMES DAILY. 1 kit 0   Cholecalciferol (VITAMIN D3) 50 MCG (2000 UT) capsule Take 1 capsule (2,000 Units total) by mouth daily. (Patient not taking: Reported on 02/10/2022) 90 capsule 1   clindamycin (CLEOCIN) 2 % vaginal cream Place 1 Applicatorful vaginally  at bedtime. 40 g 0   clonazePAM (KLONOPIN) 0.5 MG tablet Take 1 tablet (0.5 mg total) by mouth 3 (three) times daily as needed for anxiety. 90 tablet 2   Continuous Blood Gluc Receiver (DEXCOM G6 RECEIVER) DEVI 1 Bag by Does not apply route in the morning, at noon, and at bedtime. 1 each 0   Continuous Glucose Sensor (DEXCOM G6 SENSOR) MISC USE AS DIRECTED EVERY 10 DAYS 9 each 2   Continuous Glucose Transmitter (DEXCOM G6 TRANSMITTER) MISC USE AS DIRECTED OVER 90 DAYS 1 each 2   cyclobenzaprine (FLEXERIL) 10 MG tablet Take 1 tablet (10 mg total) by mouth 2 (two) times daily as needed for muscle spasms. 12 tablet 0   Elastic Bandages & Supports (ELBOW COMPRESSION) MISC 1 Application by Does not apply route daily. 1 each 0   fluconazole (DIFLUCAN) 200 MG tablet Take 1 tablet (200 mg total) by mouth every 3 (three) days as needed. 2 tablet 0   glucose blood (ONETOUCH VERIO) test strip USE TO CHECK BLOOD SUGAR THREE TIMES DAILY. 100 each 2   hydrochlorothiazide (HYDRODIURIL) 25 MG tablet Take 1 tablet (25 mg total) by mouth daily. 90 tablet 1   hydrOXYzine (VISTARIL) 50 MG capsule TAKE 1 CAPSULE BY MOUTH THREE TIMES A DAY AS NEEDED 270 capsule 1   Insulin Glargine (BASAGLAR KWIKPEN) 100 UNIT/ML Inject 15 Units into the skin daily. 15 mL 0   insulin lispro (HUMALOG KWIKPEN) 100 UNIT/ML KwikPen INJECT up to 12u INTO THE SKIN 3 TIMES DAILY WITH MEALS per sliding scale: 200-250: 4 UNITS, 251-300: 6 UNITS, 301-350: 8 UNITS, 351-400: 10 UNITS,>400:12 UNITS AND CALL NP. Max daily dose: 36u 30 mL 0   Insulin Pen Needle (PEN NEEDLES) 32G X 5 MM MISC Use to inject sliding scale Humalog three times daily. 100 each 6   lidocaine (LIDODERM) 5 % Place 1 patch onto the skin daily. Remove & Discard patch within 12 hours or as directed by MD 30 patch 0   LINZESS 145 MCG CAPS capsule Take 1 capsule (145 mcg total) by mouth daily as needed (constipation). 90 capsule 1   lisinopril (ZESTRIL) 20 MG tablet Take 1 tablet (20  mg total) by mouth daily. 90 tablet 1   metoCLOPramide (REGLAN) 10 MG tablet Take 10 mg by mouth every 6 (six) hours as needed.     metoprolol tartrate (LOPRESSOR) 25 MG tablet TAKE 1 TABLET BY MOUTH TWICE A DAY 180 tablet 1   norethindrone (MICRONOR) 0.35 MG tablet TAKE 1  TABLET BY MOUTH EVERY DAY 84 tablet 0   omeprazole (PRILOSEC) 20 MG capsule TAKE 1 CAPSULE BY MOUTH DAILY IF NEEDED FOR GASTRIC REFLUX 90 capsule 0   ondansetron (ZOFRAN-ODT) 4 MG disintegrating tablet TAKE 1 TABLET BY MOUTH EVERY 8 HOURS AS NEEDED FOR NAUSEA AND VOMITING 12 tablet 0   OneTouch Delica Lancets 33G MISC USE TO CHECK BLOOD SUGAR THREE TIMES DAILY. 100 each 2   No current facility-administered medications for this visit.      ___________________________________________________________________ Objective   Exam:  There were no vitals taken for this visit. Wt Readings from Last 3 Encounters:  08/31/22 202 lb 9.6 oz (91.9 kg)  05/05/22 194 lb 14.2 oz (88.4 kg)  03/03/22 194 lb 12.8 oz (88.4 kg)    General: ***  Eyes: sclera anicteric, no redness ENT: oral mucosa moist without lesions, no cervical or supraclavicular lymphadenopathy CV: ***, no JVD, no peripheral edema Resp: clear to auscultation bilaterally, normal RR and effort noted GI: soft, *** tenderness, with active bowel sounds. No guarding or palpable organomegaly noted. Skin; warm and dry, no rash or jaundice noted Neuro: awake, alert and oriented x 3. Normal gross motor function and fluent speech   Labs:     Latest Ref Rng & Units 08/31/2022    9:54 AM 05/05/2022    1:30 PM 03/31/2021   11:25 AM  CBC  WBC 3.4 - 10.8 x10E3/uL 10.5  9.5    Hemoglobin 11.1 - 15.9 g/dL 16.1  09.6  04.5   Hematocrit 34.0 - 46.6 % 41.0  36.1  44.0   Platelets 150 - 450 x10E3/uL 249  243        Latest Ref Rng & Units 08/31/2022    9:54 AM 05/05/2022    1:30 PM 03/03/2022   11:28 AM  CMP  Glucose 70 - 99 mg/dL 409  811  914   BUN 6 - 24 mg/dL 12  13  8     Creatinine 0.57 - 1.00 mg/dL 7.82  9.56  2.13   Sodium 134 - 144 mmol/L 141  137  137   Potassium 3.5 - 5.2 mmol/L 3.9  3.7  3.6   Chloride 96 - 106 mmol/L 103  102  98   CO2 20 - 29 mmol/L 20  25  23    Calcium 8.7 - 10.2 mg/dL 9.6  9.1  9.5   Total Protein 6.0 - 8.5 g/dL 7.6  7.4  7.8   Total Bilirubin 0.0 - 1.2 mg/dL 0.3  0.6  0.3   Alkaline Phos 44 - 121 IU/L 86  61  110   AST 0 - 40 IU/L 77  66  62   ALT 0 - 32 IU/L 78  61  59    Negative hepatitis C antibody May 2024  No iron studies on file  No celiac antibody on file  May 2024 -total cholesterol 249, triglyceride 197  Radiologic Studies:  CT urogram January 2024  Narrative & Impression  CLINICAL DATA:  Lower abdominal and pelvic pain for several weeks but worsening over the last 2 days. Previous tubal ligation and endometrial ablation. Prior appendectomy   EXAM: CT ABDOMEN AND PELVIS WITHOUT CONTRAST   TECHNIQUE: Multidetector CT imaging of the abdomen and pelvis was performed following the standard protocol without IV contrast.   RADIATION DOSE REDUCTION: This exam was performed according to the departmental dose-optimization program which includes automated exposure control, adjustment of the mA and/or kV according to patient size and/or use  of iterative reconstruction technique.   COMPARISON:  None Available.   FINDINGS: Lower chest: Lung bases are clear.  No pleural effusion.   Hepatobiliary: Diffuse fatty liver infiltration. Areas of sparing along the gallbladder fossa margin. The gallbladder itself is nondilated.   Pancreas: Moderate pancreatic atrophy. No obvious mass on noncontrast imaging.   Spleen: The spleen is nonenlarged.   Adrenals/Urinary Tract: Adrenal glands are preserved. No abnormal calcification is seen within either kidney nor along the expected course of either ureter. The extreme distal right ureter is difficult to follow but no secondary changes of abnormality. Bladder is  underdistended but has a preserved contour.   Stomach/Bowel: On this non oral contrast exam the large bowel has a normal course and caliber with scattered colonic stool. Surgical changes along the posterior aspect of the cecum consistent with history of appendectomy. There is moderate debris in the stomach. The small bowel is nondilated on this non oral contrast exam.   Vascular/Lymphatic: Normal caliber aorta and IVC with some atherosclerotic changes along the aorta and branch vessels. No specific abnormal lymph node enlargement seen in the abdomen and pelvis.   Reproductive: Uterus and bilateral adnexa are unremarkable.   Other: No abdominal wall hernia or abnormality. No abdominopelvic ascites.   Musculoskeletal: No acute or significant osseous findings.   IMPRESSION: No obstructing renal stone.   Previous appendectomy. No bowel obstruction, free air or free fluid.   Fatty liver infiltration.     Electronically Signed   By: Karen Kays M.D.   On: 05/05/2022 13:13      No diagnosis found.  Assessment and Plan              Plan:  ***  Thank you for the courtesy of this consult.  Please call me with any questions or concerns.  Charlie Pitter III  CC: Referring provider noted above

## 2023-05-18 ENCOUNTER — Other Ambulatory Visit: Payer: Self-pay | Admitting: Adult Health

## 2023-05-18 DIAGNOSIS — G4700 Insomnia, unspecified: Secondary | ICD-10-CM

## 2023-05-18 DIAGNOSIS — F410 Panic disorder [episodic paroxysmal anxiety] without agoraphobia: Secondary | ICD-10-CM

## 2023-05-19 NOTE — Telephone Encounter (Signed)
Lf 12/26 lv11/13 nv 2/13

## 2023-05-25 ENCOUNTER — Encounter (HOSPITAL_BASED_OUTPATIENT_CLINIC_OR_DEPARTMENT_OTHER): Payer: Self-pay

## 2023-05-25 ENCOUNTER — Emergency Department (HOSPITAL_BASED_OUTPATIENT_CLINIC_OR_DEPARTMENT_OTHER): Payer: 59 | Admitting: Radiology

## 2023-05-25 ENCOUNTER — Other Ambulatory Visit: Payer: Self-pay

## 2023-05-25 DIAGNOSIS — R509 Fever, unspecified: Secondary | ICD-10-CM | POA: Diagnosis not present

## 2023-05-25 DIAGNOSIS — E119 Type 2 diabetes mellitus without complications: Secondary | ICD-10-CM | POA: Diagnosis not present

## 2023-05-25 DIAGNOSIS — R000 Tachycardia, unspecified: Secondary | ICD-10-CM | POA: Insufficient documentation

## 2023-05-25 DIAGNOSIS — Z79899 Other long term (current) drug therapy: Secondary | ICD-10-CM | POA: Insufficient documentation

## 2023-05-25 DIAGNOSIS — R059 Cough, unspecified: Secondary | ICD-10-CM | POA: Diagnosis present

## 2023-05-25 DIAGNOSIS — Z794 Long term (current) use of insulin: Secondary | ICD-10-CM | POA: Diagnosis not present

## 2023-05-25 DIAGNOSIS — R519 Headache, unspecified: Secondary | ICD-10-CM | POA: Diagnosis not present

## 2023-05-25 DIAGNOSIS — R7401 Elevation of levels of liver transaminase levels: Secondary | ICD-10-CM | POA: Insufficient documentation

## 2023-05-25 DIAGNOSIS — M791 Myalgia, unspecified site: Secondary | ICD-10-CM | POA: Insufficient documentation

## 2023-05-25 DIAGNOSIS — Z20822 Contact with and (suspected) exposure to covid-19: Secondary | ICD-10-CM | POA: Insufficient documentation

## 2023-05-25 DIAGNOSIS — D72829 Elevated white blood cell count, unspecified: Secondary | ICD-10-CM | POA: Insufficient documentation

## 2023-05-25 DIAGNOSIS — I10 Essential (primary) hypertension: Secondary | ICD-10-CM | POA: Insufficient documentation

## 2023-05-25 DIAGNOSIS — Z7984 Long term (current) use of oral hypoglycemic drugs: Secondary | ICD-10-CM | POA: Diagnosis not present

## 2023-05-25 DIAGNOSIS — R0981 Nasal congestion: Secondary | ICD-10-CM | POA: Insufficient documentation

## 2023-05-25 LAB — COMPREHENSIVE METABOLIC PANEL
ALT: 86 U/L — ABNORMAL HIGH (ref 0–44)
AST: 71 U/L — ABNORMAL HIGH (ref 15–41)
Albumin: 4.4 g/dL (ref 3.5–5.0)
Alkaline Phosphatase: 70 U/L (ref 38–126)
Anion gap: 12 (ref 5–15)
BUN: 10 mg/dL (ref 6–20)
CO2: 25 mmol/L (ref 22–32)
Calcium: 9.5 mg/dL (ref 8.9–10.3)
Chloride: 94 mmol/L — ABNORMAL LOW (ref 98–111)
Creatinine, Ser: 0.87 mg/dL (ref 0.44–1.00)
GFR, Estimated: >60 mL/min (ref >60)
Glucose, Bld: 314 mg/dL — ABNORMAL HIGH (ref 70–99)
Potassium: 3.5 mmol/L (ref 3.5–5.1)
Sodium: 131 mmol/L — ABNORMAL LOW (ref 135–145)
Total Bilirubin: 1 mg/dL (ref 0.0–1.2)
Total Protein: 8.4 g/dL — ABNORMAL HIGH (ref 6.5–8.1)

## 2023-05-25 LAB — CBC WITH DIFFERENTIAL/PLATELET
Abs Immature Granulocytes: 0.04 10*3/uL (ref 0.00–0.07)
Basophils Absolute: 0 10*3/uL (ref 0.0–0.1)
Basophils Relative: 0 %
Eosinophils Absolute: 0.2 10*3/uL (ref 0.0–0.5)
Eosinophils Relative: 1 %
HCT: 42.4 % (ref 36.0–46.0)
Hemoglobin: 15.1 g/dL — ABNORMAL HIGH (ref 12.0–15.0)
Immature Granulocytes: 0 %
Lymphocytes Relative: 4 %
Lymphs Abs: 0.5 10*3/uL — ABNORMAL LOW (ref 0.7–4.0)
MCH: 33.5 pg (ref 26.0–34.0)
MCHC: 35.6 g/dL (ref 30.0–36.0)
MCV: 94 fL (ref 80.0–100.0)
Monocytes Absolute: 0.5 10*3/uL (ref 0.1–1.0)
Monocytes Relative: 4 %
Neutro Abs: 10.6 10*3/uL — ABNORMAL HIGH (ref 1.7–7.7)
Neutrophils Relative %: 91 %
Platelets: 187 10*3/uL (ref 150–400)
RBC: 4.51 MIL/uL (ref 3.87–5.11)
RDW: 12.5 % (ref 11.5–15.5)
WBC: 11.8 10*3/uL — ABNORMAL HIGH (ref 4.0–10.5)
nRBC: 0 % (ref 0.0–0.2)

## 2023-05-25 LAB — RESP PANEL BY RT-PCR (RSV, FLU A&B, COVID)  RVPGX2
Influenza A by PCR: NEGATIVE
Influenza B by PCR: NEGATIVE
Resp Syncytial Virus by PCR: NEGATIVE
SARS Coronavirus 2 by RT PCR: NEGATIVE

## 2023-05-25 LAB — CBG MONITORING, ED: Glucose-Capillary: 344 mg/dL — ABNORMAL HIGH (ref 70–99)

## 2023-05-25 MED ORDER — ACETAMINOPHEN 325 MG PO TABS
650.0000 mg | ORAL_TABLET | Freq: Once | ORAL | Status: AC
Start: 2023-05-25 — End: 2023-05-25
  Administered 2023-05-25: 650 mg via ORAL
  Filled 2023-05-25: qty 2

## 2023-05-25 NOTE — ED Notes (Addendum)
Pt stated she is not on a pump for diabetes and last took her sugar around 11 am and it was 80. This tech checked sugar, currently 344.

## 2023-05-25 NOTE — ED Triage Notes (Signed)
Pt POV from home d/t Cough and flu-like s/s since 0400.  Pt too Ibuprofen at 1700 and is diabetic.

## 2023-05-26 ENCOUNTER — Emergency Department (HOSPITAL_BASED_OUTPATIENT_CLINIC_OR_DEPARTMENT_OTHER)
Admission: EM | Admit: 2023-05-26 | Discharge: 2023-05-26 | Disposition: A | Discharge status: Home / Self Care | Payer: 59 | Attending: Emergency Medicine | Admitting: Emergency Medicine

## 2023-05-26 DIAGNOSIS — B349 Viral infection, unspecified: Secondary | ICD-10-CM

## 2023-05-26 LAB — LACTIC ACID, PLASMA: Lactic Acid, Venous: 1.8 mmol/L (ref 0.5–1.9)

## 2023-05-26 MED ORDER — KETOROLAC TROMETHAMINE 30 MG/ML IJ SOLN
30.0000 mg | Freq: Once | INTRAMUSCULAR | Status: AC
  Administered 2023-05-26: 30 mg via INTRAVENOUS
  Filled 2023-05-26: qty 1

## 2023-05-26 MED ORDER — SODIUM CHLORIDE 0.9 % IV BOLUS
1000.0000 mL | Freq: Once | INTRAVENOUS | Status: AC
  Administered 2023-05-26: 1000 mL via INTRAVENOUS

## 2023-05-26 NOTE — ED Provider Notes (Signed)
[facility] Provider Note   CSN: [id number] Arrival date & time: 05/25/23  2031     History  Chief Complaint  Patient presents with   Cough    Gwendolyn Carrillo is a 48 y.o. female.  Patient is a 48 year old female with past medical history of type 2 diabetes, hypertension, gastroparesis.  Patient presenting today for evaluation of cough and congestion.  This started suddenly at 4 AM today.  She describes body aches, headache, and feeling generally weak.  She denies fevers at home, but did feel chilled and arrives here with a temp of 102.  No aggravating or alleviating factors.  The history is provided by the patient.       Home Medications Prior to Admission medications   Medication Sig Start Date End Date Taking? Authorizing Provider  ALPRAZolam Prudy Feeler) 1 MG tablet TAKE 1 TABLET BY MOUTH 2 TIMES DAILY AS NEEDED FOR ANXIETY. 05/19/23   Mozingo, Thereasa Solo, NP  atorvastatin (LIPITOR) 80 MG tablet Take 1 tablet (80 mg total) by mouth daily. 09/08/22   Grayce Sessions, NP  Blood Glucose Monitoring Suppl (ONETOUCH VERIO) w/Device KIT USE TO CHECK BLOOD SUGAR THREE TIMES DAILY. 11/29/22   Hoy Register, MD  Cholecalciferol (VITAMIN D3) 50 MCG (2000 UT) capsule Take 1 capsule (2,000 Units total) by mouth daily. Patient not taking: Reported on 02/10/2022 11/02/21   Grayce Sessions, NP  clindamycin (CLEOCIN) 2 % vaginal cream Place 1 Applicatorful vaginally at bedtime. 05/15/22   Daphine Deutscher, Mary-Margaret, FNP  clonazePAM (KLONOPIN) 0.5 MG tablet Take 1 tablet (0.5 mg total) by mouth 3 (three) times daily as needed for anxiety. 07/22/22   Mozingo, Thereasa Solo, NP  Continuous Blood Gluc Receiver (DEXCOM G6 RECEIVER) DEVI 1 Bag by Does not apply route in the morning, at noon, and at bedtime. 05/22/21   Grayce Sessions, NP  Continuous Glucose Sensor (DEXCOM G6 SENSOR) MISC USE AS DIRECTED EVERY 10 DAYS 01/11/23   Grayce Sessions, NP   Continuous Glucose Transmitter (DEXCOM G6 TRANSMITTER) MISC USE AS DIRECTED OVER 90 DAYS 01/11/23   Grayce Sessions, NP  cyclobenzaprine (FLEXERIL) 10 MG tablet Take 1 tablet (10 mg total) by mouth 2 (two) times daily as needed for muscle spasms. 12/11/22   Alvira Monday, MD  Elastic Bandages & Supports (ELBOW COMPRESSION) MISC 1 Application by Does not apply route daily. 12/11/22   Alvira Monday, MD  fluconazole (DIFLUCAN) 200 MG tablet Take 1 tablet (200 mg total) by mouth every 3 (three) days as needed. 03/24/22   Margaretann Loveless, PA-C  glucose blood (ONETOUCH VERIO) test strip USE TO CHECK BLOOD SUGAR THREE TIMES DAILY. 11/29/22   Hoy Register, MD  hydrochlorothiazide (HYDRODIURIL) 25 MG tablet Take 1 tablet (25 mg total) by mouth daily. 08/31/22   Grayce Sessions, NP  hydrOXYzine (VISTARIL) 50 MG capsule TAKE 1 CAPSULE BY MOUTH THREE TIMES A DAY AS NEEDED 05/28/22   Mozingo, Thereasa Solo, NP  Insulin Glargine (BASAGLAR KWIKPEN) 100 UNIT/ML Inject 15 Units into the skin daily. 01/14/23   Hoy Register, MD  insulin lispro (HUMALOG KWIKPEN) 100 UNIT/ML KwikPen INJECT up to 12u INTO THE SKIN 3 TIMES DAILY WITH MEALS per sliding scale: 200-250: 4 UNITS, 251-300: 6 UNITS, 301-350: 8 UNITS, 351-400: 10 UNITS,>400:12 UNITS AND CALL NP. Max daily dose: 36u 01/07/23   Hoy Register, MD  Insulin Pen Needle (PEN NEEDLES) 32G X 5 MM MISC Use to inject sliding scale Humalog three  times daily. 01/07/23   Hoy Register, MD  JARDIANCE 10 MG TABS tablet Take 10 mg by mouth daily. 02/04/23   [provider]  lidocaine (LIDODERM) 5 % Place 1 patch onto the skin daily. Remove & Discard patch within 12 hours or as directed by MD 12/11/22   Alvira Monday, MD  LINZESS 145 MCG CAPS capsule Take 1 capsule (145 mcg total) by mouth daily as needed (constipation). 08/31/22   Grayce Sessions, NP  lisinopril (ZESTRIL) 20 MG tablet Take 1 tablet (20 mg total) by mouth daily. 08/31/22   Grayce Sessions, NP  metFORMIN (GLUCOPHAGE) 500 MG tablet Take 500 mg by mouth daily with breakfast. 02/04/23   [provider]  metoCLOPramide (REGLAN) 10 MG tablet Take 10 mg by mouth every 6 (six) hours as needed.    [provider]  metoprolol tartrate (LOPRESSOR) 25 MG tablet TAKE 1 TABLET BY MOUTH TWICE A DAY 01/14/23   Patwardhan, Manish J, MD  norethindrone (MICRONOR) 0.35 MG tablet TAKE 1 TABLET BY MOUTH EVERY DAY 02/08/23   Wyline Beady A, NP  omeprazole (PRILOSEC) 20 MG capsule TAKE 1 CAPSULE BY MOUTH DAILY IF NEEDED FOR GASTRIC REFLUX 11/17/22   Hoy Register, MD  ondansetron (ZOFRAN-ODT) 4 MG disintegrating tablet TAKE 1 TABLET BY MOUTH EVERY 8 HOURS AS NEEDED FOR NAUSEA AND VOMITING 07/28/22   Grayce Sessions, NP  OneTouch Delica Lancets 33G MISC USE TO CHECK BLOOD SUGAR THREE TIMES DAILY. 11/29/22   Hoy Register, MD  telmisartan (MICARDIS) 80 MG tablet Take 1 tablet by mouth daily. 04/18/23 07/17/23  [provider]      Allergies    Patient has no known allergies.    Review of Systems   Review of Systems  All other systems reviewed and are negative.   Physical Exam Updated Vital Signs BP (!) 151/85   Pulse (!) 129   Temp 98.4 F (36.9 C) (Oral)   Resp (!) 22   Ht 5\' 4"  (1.626 m)   Wt 85.3 kg   LMP  (LMP Unknown)   SpO2 93%   BMI 32.27 kg/m  Physical Exam Vitals and nursing note reviewed.  Constitutional:      General: She is not in acute distress.    Appearance: She is well-developed. She is not diaphoretic.  HENT:     Head: Normocephalic and atraumatic.     Mouth/Throat:     Mouth: Mucous membranes are moist.  Cardiovascular:     Rate and Rhythm: Regular rhythm. Tachycardia present.     Heart sounds: No murmur heard.    No friction rub. No gallop.  Pulmonary:     Effort: Pulmonary effort is normal. No respiratory distress.     Breath sounds: Normal breath sounds. No wheezing.  Abdominal:     General: Bowel sounds are  normal. There is no distension.     Palpations: Abdomen is soft.     Tenderness: There is no abdominal tenderness.  Musculoskeletal:        General: Normal range of motion.     Cervical back: Normal range of motion and neck supple.  Skin:    General: Skin is warm and dry.  Neurological:     General: No focal deficit present.     Mental Status: She is alert and oriented to person, place, and time.     ED Results / Procedures / Treatments   Labs (all labs ordered are listed, but only abnormal results are  displayed) Labs Reviewed  CBC WITH DIFFERENTIAL/PLATELET - Abnormal; Notable for the following components:      Result Value   WBC 11.8 (*)    Hemoglobin 15.1 (*)    Neutro Abs 10.6 (*)    Lymphs Abs 0.5 (*)    All other components within normal limits  COMPREHENSIVE METABOLIC PANEL - Abnormal; Notable for the following components:   Sodium 131 (*)    Chloride 94 (*)    Glucose, Bld 314 (*)    Total Protein 8.4 (*)    AST 71 (*)    ALT 86 (*)    All other components within normal limits  CBG MONITORING, ED - Abnormal; Notable for the following components:   Glucose-Capillary 344 (*)    All other components within normal limits  RESP PANEL BY RT-PCR (RSV, FLU A&B, COVID)  [id number]  LACTIC ACID, PLASMA  LACTIC ACID, PLASMA    EKG EKG Interpretation Date/Time:  Wednesday May 25 2023 21:20:30 EST Ventricular Rate:  133 PR Interval:  138 QRS Duration:  84 QT Interval:  308 QTC Calculation: 458 R Axis:   34  Text Interpretation: Sinus tachycardia Biatrial enlargement Possible Anterolateral infarct , age undetermined Abnormal ECG When compared with ECG of 31-Mar-2021 09:33, PREVIOUS ECG IS PRESENT Confirmed by Geoffery Lyons ([id number]) on 05/26/2023 2:01:17 AM  Radiology DG Chest 2 View Result Date: 05/25/2023 CLINICAL DATA:  cough EXAM: CHEST - 2 VIEW COMPARISON:  Chest x-ray 07/04/2018 FINDINGS: The heart and mediastinal contours are within normal limits. No focal  consolidation. No pulmonary edema. No pleural effusion. No pneumothorax. No acute osseous abnormality. IMPRESSION: No active cardiopulmonary disease. Electronically Signed   By: Tish Frederickson M.D.   On: 05/25/2023 23:32    Procedures Procedures    Medications Ordered in ED Medications  sodium chloride 0.9 % bolus 1,000 mL (has no administration in time range)  ketorolac (TORADOL) 30 MG/ML injection 30 mg (has no administration in time range)  acetaminophen (TYLENOL) tablet 650 mg (650 mg Oral Given 05/25/23 2146)    ED Course/ Medical Decision Making/ A&P  Patient is a 48 year old female presenting with cough congestion and fever starting suddenly at 4 AM.  Patient arrives here with stable vital signs, but with temperature of 102.  She was given Tylenol and fever improved.  Physical examination basically unremarkable.  Laboratory studies obtained including CBC, CMP, and lipase.  She has a mild leukocytosis of white count of 11.8.  There is also a mild elevation of her LFTs, however upon reviewing her record this is her baseline.  Lactate is normal at 1.8 respiratory panel is negative for COVID/flu/RSV.  Chest x-ray is clear showing no acute process.  Patient has been hydrated with normal saline and given Toradol for discomfort and seems to be feeling better.  Patient is clinically well-appearing, but did arrive febrile and tachycardic.  She tells me that she has baseline tachycardia.  Because of her fever unknown, but I suspect a viral etiology given her cough and congestion.  Patient to be discharged with as needed return.  Final Clinical Impression(s) / ED Diagnoses Final diagnoses:  None    Rx / DC Orders ED Discharge Orders     None         Geoffery Lyons, MD 05/26/23 [phone]

## 2023-05-26 NOTE — Discharge Instructions (Signed)
Drink plenty of fluids and get plenty of rest.  Take ibuprofen 600 mg rotated with Tylenol 1000 mg every 4 hours as needed for fever.  Take over-the-counter medications as needed for relief of symptoms.

## 2023-06-04 ENCOUNTER — Other Ambulatory Visit: Payer: Self-pay | Admitting: Nurse Practitioner

## 2023-06-04 DIAGNOSIS — Z30011 Encounter for initial prescription of contraceptive pills: Secondary | ICD-10-CM

## 2023-06-06 NOTE — Telephone Encounter (Signed)
 Medication refill request: Micronor   Last AEX:  10/05/03/21 Next AEX: not scheduled  Last MMG (if hormonal medication request): n/a Refill authorized:  refused. Patient needs an appointment

## 2023-06-09 ENCOUNTER — Encounter: Payer: Self-pay | Admitting: Adult Health

## 2023-06-09 ENCOUNTER — Telehealth: Payer: 59 | Admitting: Adult Health

## 2023-06-09 DIAGNOSIS — F411 Generalized anxiety disorder: Secondary | ICD-10-CM

## 2023-06-09 DIAGNOSIS — F410 Panic disorder [episodic paroxysmal anxiety] without agoraphobia: Secondary | ICD-10-CM

## 2023-06-09 DIAGNOSIS — G4700 Insomnia, unspecified: Secondary | ICD-10-CM | POA: Diagnosis not present

## 2023-06-09 DIAGNOSIS — F331 Major depressive disorder, recurrent, moderate: Secondary | ICD-10-CM

## 2023-06-09 DIAGNOSIS — F422 Mixed obsessional thoughts and acts: Secondary | ICD-10-CM

## 2023-06-09 MED ORDER — HYDROXYZINE HCL 25 MG PO TABS: 25.0000 mg | ORAL_TABLET | Freq: Three times a day (TID) | ORAL | 0 refills | Status: DC | PRN

## 2023-06-09 NOTE — Progress Notes (Signed)
Gwendolyn Carrillo [id number] [birth year] 48 y.o.  Virtual Visit via Video Note  I connected with pt @ on 06/09/23 at  5:30 PM EST by a video enabled telemedicine application and verified that I am speaking with the correct person using two identifiers.   I discussed the limitations of evaluation and management by telemedicine and the availability of in person appointments. The patient expressed understanding and agreed to proceed.  I discussed the assessment and treatment plan with the patient. The patient was provided an opportunity to ask questions and all were answered. The patient agreed with the plan and demonstrated an understanding of the instructions.   The patient was advised to call back or seek an in-person evaluation if the symptoms worsen or if the condition fails to improve as anticipated.  I provided 25 minutes of non-face-to-face time during this encounter.  The patient was located at home.  The provider was located at [facility].   Dorothyann Gibbs, NP   Subjective:   Patient ID:  Gwendolyn Carrillo is a 48 y.o. (DOB [birth year]) female.  Chief Complaint: No chief complaint on file.   HPI Gwendolyn Carrillo presents for follow-up of MDD, mixed obsessional thoughts, insomnia, panic attacks, and GAD.   Describes mood today as "not good". Reports tearfulness. Pleasant. Flat. Mood symptoms - reports depression and anxiety. Denies irritability. Reports panic attacks. Reports worry, rumination, and over thinking. Reports increased situational stressors - financial and family. Reports almost losing her 28 y/o son to a Fentanyl over dose - now addicted to Fentanyl. Reports concerns about driving - "my anxiety is out of control". Reports isolating and not wanting to leave the house. She does have family support. Mood is variable. Stating "I feel like I'm struggling". Reports taking Xanax as needed. Varying interest and motivation. Taking medications as prescribed.  Energy  levels lower. Active, does not have a regular exercise routine.  Able to enjoy usual interests and activities. Single.  Has 4 children. Family local. Spending time with family.  Appetite adequate. Weight gain - 188 pounds.  Reports sleeping difficulties. Averages 4 hours. Reports focus and concentration difficulties - worried. Completing minimal tasks. Managing some aspects of household. Working full time - remotely - [employer]. Denies SI or HI.  Denies AH or VH. Denies self harm. Denies substance use. Working with a Paramedic.  Previous medications: Zoloft, Clonazepam   Review of Systems:  Review of Systems  Musculoskeletal:  Negative for gait problem.  Neurological:  Negative for tremors.  Psychiatric/Behavioral:         Please refer to HPI    Medications: I have reviewed the patient's current medications.  Current Outpatient Medications  Medication Sig Dispense Refill   hydrOXYzine (ATARAX) 25 MG tablet Take 1 tablet (25 mg total) by mouth 3 (three) times daily as needed. 90 tablet 0   ALPRAZolam (XANAX) 1 MG tablet TAKE 1 TABLET BY MOUTH 2 TIMES DAILY AS NEEDED FOR ANXIETY. 60 tablet 0   atorvastatin (LIPITOR) 80 MG tablet Take 1 tablet (80 mg total) by mouth daily. 90 tablet 1   Blood Glucose Monitoring Suppl (ONETOUCH VERIO) w/Device KIT USE TO CHECK BLOOD SUGAR THREE TIMES DAILY. 1 kit 0   Cholecalciferol (VITAMIN D3) 50 MCG (2000 UT) capsule Take 1 capsule (2,000 Units total) by mouth daily. (Patient not taking: Reported on 02/10/2022) 90 capsule 1   clindamycin (CLEOCIN) 2 % vaginal cream Place 1 Applicatorful vaginally at bedtime. 40 g 0   Continuous Blood  Gluc Receiver (DEXCOM G6 RECEIVER) DEVI 1 Bag by Does not apply route in the morning, at noon, and at bedtime. 1 each 0   Continuous Glucose Sensor (DEXCOM G6 SENSOR) MISC USE AS DIRECTED EVERY 10 DAYS 9 each 2   Continuous Glucose Transmitter (DEXCOM G6 TRANSMITTER) MISC USE AS DIRECTED OVER 90 DAYS 1 each 2    cyclobenzaprine (FLEXERIL) 10 MG tablet Take 1 tablet (10 mg total) by mouth 2 (two) times daily as needed for muscle spasms. 12 tablet 0   Elastic Bandages & Supports (ELBOW COMPRESSION) MISC 1 Application by Does not apply route daily. 1 each 0   fluconazole (DIFLUCAN) 200 MG tablet Take 1 tablet (200 mg total) by mouth every 3 (three) days as needed. 2 tablet 0   glucose blood (ONETOUCH VERIO) test strip USE TO CHECK BLOOD SUGAR THREE TIMES DAILY. 100 each 2   hydrochlorothiazide (HYDRODIURIL) 25 MG tablet Take 1 tablet (25 mg total) by mouth daily. 90 tablet 1   Insulin Glargine (BASAGLAR KWIKPEN) 100 UNIT/ML Inject 15 Units into the skin daily. 15 mL 0   insulin lispro (HUMALOG KWIKPEN) 100 UNIT/ML KwikPen INJECT up to 12u INTO THE SKIN 3 TIMES DAILY WITH MEALS per sliding scale: 200-250: 4 UNITS, 251-300: 6 UNITS, 301-350: 8 UNITS, 351-400: 10 UNITS,>400:12 UNITS AND CALL NP. Max daily dose: 36u 30 mL 0   Insulin Pen Needle (PEN NEEDLES) 32G X 5 MM MISC Use to inject sliding scale Humalog three times daily. 100 each 6   JARDIANCE 10 MG TABS tablet Take 10 mg by mouth daily.     lidocaine (LIDODERM) 5 % Place 1 patch onto the skin daily. Remove & Discard patch within 12 hours or as directed by MD 30 patch 0   LINZESS 145 MCG CAPS capsule Take 1 capsule (145 mcg total) by mouth daily as needed (constipation). 90 capsule 1   lisinopril (ZESTRIL) 20 MG tablet Take 1 tablet (20 mg total) by mouth daily. 90 tablet 1   metFORMIN (GLUCOPHAGE) 500 MG tablet Take 500 mg by mouth daily with breakfast.     metoCLOPramide (REGLAN) 10 MG tablet Take 10 mg by mouth every 6 (six) hours as needed.     metoprolol tartrate (LOPRESSOR) 25 MG tablet TAKE 1 TABLET BY MOUTH TWICE A DAY 180 tablet 1   norethindrone (MICRONOR) 0.35 MG tablet TAKE 1 TABLET BY MOUTH EVERY DAY 84 tablet 0   omeprazole (PRILOSEC) 20 MG capsule TAKE 1 CAPSULE BY MOUTH DAILY IF NEEDED FOR GASTRIC REFLUX 90 capsule 0   ondansetron  (ZOFRAN-ODT) 4 MG disintegrating tablet TAKE 1 TABLET BY MOUTH EVERY 8 HOURS AS NEEDED FOR NAUSEA AND VOMITING 12 tablet 0   OneTouch Delica Lancets 33G MISC USE TO CHECK BLOOD SUGAR THREE TIMES DAILY. 100 each 2   telmisartan (MICARDIS) 80 MG tablet Take 1 tablet by mouth daily.     No current facility-administered medications for this visit.    Medication Side Effects: None  Allergies: No Known Allergies  Past Medical History:  Diagnosis Date   Anxiety    Diabetes mellitus without complication (HCC)    Gastroparesis    Hypertension    Major depressive disorder     Family History  Problem Relation Age of Onset   Hyperlipidemia Mother    Hypertension Mother    Hyperlipidemia Father    Hypertension Father    Diabetes Father    Diabetes Sister    Blindness Sister    Hypertension Brother  Diabetes Brother    Stroke Brother    Diabetes Maternal Aunt    Diabetes Maternal Grandmother    Hypertension Maternal Grandmother    Heart disease Maternal Grandmother    Diabetes Paternal Grandmother    Hypertension Paternal Grandmother    Heart disease Paternal Grandmother     Social History   Socioeconomic History   Marital status: Single    Spouse name: Not on file   Number of children: Not on file   Years of education: Not on file   Highest education level: Not on file  Occupational History   Not on file  Tobacco Use   Smoking status: Some Days    Current packs/day: 0.25    Types: Cigarettes   Smokeless tobacco: Never   Tobacco comments:    "I smoke when I'm drinking"  Vaping Use   Vaping status: Never Used  Substance and Sexual Activity   Alcohol use: Yes    Comment: occ   Drug use: Not Currently    Types: Marijuana   Sexual activity: Not Currently    Partners: Male    Comment: First IC <16, Partners >5, Hx of CT+, GC+  Other Topics Concern   Not on file  Social History Narrative   Not on file   Social Drivers of Health   Financial Resource Strain: Not  on file  Food Insecurity: Low Risk  (05/18/2023)   Received from [facility]   Hunger Vital Sign    Worried About Running Out of Food in the Last Year: Never true    Ran Out of Food in the Last Year: Never true  Transportation Needs: No Transportation Needs (05/18/2023)   Received from [facility]    In the past 12 months, has lack of reliable transportation kept you from medical appointments, meetings, work or from getting things needed for daily living? : No  Physical Activity: Not on file  Stress: Not on file  Social Connections: Unknown (08/25/2021)   Received from [facility]   Social Network    Social Network: Not on file  Intimate Partner Violence: Unknown (07/30/2021)   Received from [facility], [facility]   HITS    Physically Hurt: Not on file    Insult or Talk Down To: Not on file    Threaten Physical Harm: Not on file    Scream or Curse: Not on file    Past Medical History, Surgical history, Social history, and Family history were reviewed and updated as appropriate.   Please see review of systems for further details on the patient's review from today.   Objective:   Physical Exam:  LMP  (LMP Unknown)   Physical Exam Constitutional:      General: She is not in acute distress. Musculoskeletal:        General: No deformity.  Neurological:     Mental Status: She is alert and oriented to person, place, and time.     Coordination: Coordination normal.  Psychiatric:        Attention and Perception: Attention and perception normal. She does not perceive auditory or visual hallucinations.        Mood and Affect: Mood normal. Mood is not anxious or depressed. Affect is not labile, blunt, angry or inappropriate.        Speech: Speech normal.        Behavior: Behavior normal.        Thought Content: Thought content normal. Thought  content is not paranoid or delusional. Thought content does not include homicidal or suicidal ideation.  Thought content does not include homicidal or suicidal plan.        Cognition and Memory: Cognition and memory normal.        Judgment: Judgment normal.     Comments: Insight intact     Lab Review:     Component Value Date/Time   NA 131 (L) 05/25/2023 2132   NA 141 08/31/2022 0954   K 3.5 05/25/2023 2132   CL 94 (L) 05/25/2023 2132   CO2 25 05/25/2023 2132   GLUCOSE 314 (H) 05/25/2023 2132   BUN 10 05/25/2023 2132   BUN 12 08/31/2022 0954   CREATININE 0.87 05/25/2023 2132   CALCIUM 9.5 05/25/2023 2132   PROT 8.4 (H) 05/25/2023 2132   PROT 7.6 08/31/2022 0954   ALBUMIN 4.4 05/25/2023 2132   ALBUMIN 4.3 08/31/2022 0954   AST 71 (H) 05/25/2023 2132   ALT 86 (H) 05/25/2023 2132   ALKPHOS 70 05/25/2023 2132   BILITOT 1.0 05/25/2023 2132   BILITOT 0.3 08/31/2022 0954   GFRNONAA >60 05/25/2023 2132   GFRAA >60 10/10/2019 1441       Component Value Date/Time   WBC 11.8 (H) 05/25/2023 2132   RBC 4.51 05/25/2023 2132   HGB 15.1 (H) 05/25/2023 2132   HGB 13.9 08/31/2022 0954   HCT 42.4 05/25/2023 2132   HCT 41.0 08/31/2022 0954   PLT 187 05/25/2023 2132   PLT 249 08/31/2022 0954   MCV 94.0 05/25/2023 2132   MCV 96 08/31/2022 0954   MCH 33.5 05/25/2023 2132   MCHC 35.6 05/25/2023 2132   RDW 12.5 05/25/2023 2132   RDW 12.6 08/31/2022 0954   LYMPHSABS 0.5 (L) 05/25/2023 2132   LYMPHSABS 2.3 08/31/2022 0954   MONOABS 0.5 05/25/2023 2132   EOSABS 0.2 05/25/2023 2132   EOSABS 0.3 08/31/2022 0954   BASOSABS 0.0 05/25/2023 2132   BASOSABS 0.1 08/31/2022 0954    No results found for: "POCLITH", "LITHIUM"   No results found for: "PHENYTOIN", "PHENOBARB", "VALPROATE", "CBMZ"   .res Assessment: Plan:   Plan:  PDMP reviewed  Decrease Xanax 1mg  BID as needed  Unable to take antidepressants.  Gene sight testing reviewed   RTC 4 weeks  Will complete workplace accomodation form - unable to drive into office monthly due to anxiety and panic.   15 minutes spent  dedicated to the care of this patient on the date of this encounter to include pre-visit review of records, ordering of medication, post visit documentation, and face-to-face time with the patient discussing MDD, mixed obsessional thoughts, insomnia, panic attacks, and GAD. epression, anxiety and OCD. Discussed adding Hydroxyzine 25mg  TID to  current medication regimen.  Patient was advised to contact office with any questions, adverse effects, or acute worsening in signs and symptoms.  Discussed potential benefits, risk, and side effects of benzodiazepines to include potential risk of tolerance and dependence, as well as possible drowsiness. Advised patient not to drive if experiencing drowsiness and to take lowest possible effective dose to minimize risk of dependence and tolerance.  Diagnoses and all orders for this visit:  Major depressive disorder, recurrent episode, moderate (HCC)  Generalized anxiety disorder  Panic attacks  Insomnia, unspecified type  Mixed obsessional thoughts and acts  Other orders -     hydrOXYzine (ATARAX) 25 MG tablet; Take 1 tablet (25 mg total) by mouth 3 (three) times daily as needed.     Please see  After Visit Summary for patient specific instructions.  No future appointments.   No orders of the defined types were placed in this encounter.     -------------------------------

## 2023-06-13 ENCOUNTER — Telehealth: Payer: Self-pay | Admitting: Adult Health

## 2023-06-13 NOTE — Telephone Encounter (Signed)
Accommodations form received 06/09/23.  Needs to be completed and cost assigned.  Once this is done we will collect the fee and send.  Given to [facility] for completion and cost

## 2023-06-15 ENCOUNTER — Other Ambulatory Visit: Payer: Self-pay | Admitting: Adult Health

## 2023-06-15 DIAGNOSIS — F410 Panic disorder [episodic paroxysmal anxiety] without agoraphobia: Secondary | ICD-10-CM

## 2023-06-15 DIAGNOSIS — G4700 Insomnia, unspecified: Secondary | ICD-10-CM

## 2023-06-15 NOTE — Telephone Encounter (Signed)
Lf 1/23; lv 2/13 rtc 4 weeks.   Please schedule pt for fu visit.

## 2023-06-17 ENCOUNTER — Telehealth: Payer: Self-pay

## 2023-06-17 DIAGNOSIS — Z0289 Encounter for other administrative examinations: Secondary | ICD-10-CM

## 2023-06-17 NOTE — Telephone Encounter (Signed)
Received accomodation forms to complete. Will have Regina review and sign. [employer]

## 2023-06-17 NOTE — Telephone Encounter (Signed)
Pt has appt 3/12.

## 2023-06-21 NOTE — Telephone Encounter (Signed)
 Called pt. LVM accomodation forms completed and charge of $25 needs to be paid. Call office ASAP.

## 2023-07-06 ENCOUNTER — Encounter: Payer: Self-pay | Admitting: Adult Health

## 2023-07-06 ENCOUNTER — Telehealth: Payer: 59 | Admitting: Adult Health

## 2023-07-06 DIAGNOSIS — F410 Panic disorder [episodic paroxysmal anxiety] without agoraphobia: Secondary | ICD-10-CM | POA: Diagnosis not present

## 2023-07-06 DIAGNOSIS — F411 Generalized anxiety disorder: Secondary | ICD-10-CM

## 2023-07-06 DIAGNOSIS — G4700 Insomnia, unspecified: Secondary | ICD-10-CM | POA: Diagnosis not present

## 2023-07-06 DIAGNOSIS — F331 Major depressive disorder, recurrent, moderate: Secondary | ICD-10-CM | POA: Diagnosis not present

## 2023-07-06 DIAGNOSIS — F422 Mixed obsessional thoughts and acts: Secondary | ICD-10-CM

## 2023-07-06 MED ORDER — HYDROXYZINE HCL 25 MG PO TABS: 25.0000 mg | ORAL_TABLET | Freq: Three times a day (TID) | ORAL | 1 refills | Status: AC | PRN

## 2023-07-06 MED ORDER — ALPRAZOLAM 1 MG PO TABS: 1.0000 mg | ORAL_TABLET | Freq: Two times a day (BID) | ORAL | 1 refills | Status: DC | PRN

## 2023-07-06 NOTE — Progress Notes (Signed)
 Gwendolyn Carrillo [id number] [birth year] 48 y.o.  Virtual Visit via Video Note  I connected with pt @ on 07/06/23 at 10:00 AM EDT by a video enabled telemedicine application and verified that I am speaking with the correct person using two identifiers.   I discussed the limitations of evaluation and management by telemedicine and the availability of in person appointments. The patient expressed understanding and agreed to proceed.  I discussed the assessment and treatment plan with the patient. The patient was provided an opportunity to ask questions and all were answered. The patient agreed with the plan and demonstrated an understanding of the instructions.   The patient was advised to call back or seek an in-person evaluation if the symptoms worsen or if the condition fails to improve as anticipated.  I provided 25 minutes of non-face-to-face time during this encounter.  The patient was located at home.  The provider was located at [facility].   Dorothyann Gibbs, NP   Subjective:   Patient ID:  Gwendolyn Carrillo is a 48 y.o. (DOB [birth year]) female.  Chief Complaint: No chief complaint on file.   HPI Gwendolyn Carrillo presents for follow-up of MDD, mixed obsessional thoughts, insomnia, panic attacks, and GAD.   Describes mood today as "not too good". Reports tearfulness - "I have my days". Pleasant. Flat. Mood symptoms - reports depression and anxiety. Reports varying interest and motivation. Denies irritability. Denies recent panic attacks. Reports worry, rumination and over thinking. Reports increased situational stressors. Reports isolating and not wanting to leave the house - she did go out with her daughter last weekend. Mood is lower. Stating "I don't feel like I'm doing much better". Reports taking Xanax as needed. Taking medications as prescribed.  Energy levels lower. Active, does not have a regular exercise routine.  Able to enjoy usual interests and activities.  Single.  Has 4 children. Family local. Spending time with family.  Appetite adequate. Weight loss - 188 pounds.  Reports sleep has improved with addition of Hydroxyzine. Averages 8 or more hours. Reports focus and concentration mostly stable. Completing minimal tasks. Managing some aspects of household. Working full time - remotely - [employer]. Denies SI or HI.  Denies AH or VH. Denies self harm. Denies substance use.  Working with a Paramedic.  Previous medications: Zoloft, Clonazepam  Review of Systems:  Review of Systems  Musculoskeletal:  Negative for gait problem.  Neurological:  Negative for tremors.  Psychiatric/Behavioral:         Please refer to HPI    Medications: I have reviewed the patient's current medications.  Current Outpatient Medications  Medication Sig Dispense Refill   ALPRAZolam (XANAX) 1 MG tablet TAKE 1 TABLET BY MOUTH TWICE A DAY AS NEEDED FOR ANXIETY 60 tablet 0   atorvastatin (LIPITOR) 80 MG tablet Take 1 tablet (80 mg total) by mouth daily. 90 tablet 1   Blood Glucose Monitoring Suppl (ONETOUCH VERIO) w/Device KIT USE TO CHECK BLOOD SUGAR THREE TIMES DAILY. 1 kit 0   Cholecalciferol (VITAMIN D3) 50 MCG (2000 UT) capsule Take 1 capsule (2,000 Units total) by mouth daily. (Patient not taking: Reported on 02/10/2022) 90 capsule 1   clindamycin (CLEOCIN) 2 % vaginal cream Place 1 Applicatorful vaginally at bedtime. 40 g 0   Continuous Blood Gluc Receiver (DEXCOM G6 RECEIVER) DEVI 1 Bag by Does not apply route in the morning, at noon, and at bedtime. 1 each 0   Continuous Glucose Sensor (DEXCOM G6 SENSOR) MISC USE AS  DIRECTED EVERY 10 DAYS 9 each 2   Continuous Glucose Transmitter (DEXCOM G6 TRANSMITTER) MISC USE AS DIRECTED OVER 90 DAYS 1 each 2   cyclobenzaprine (FLEXERIL) 10 MG tablet Take 1 tablet (10 mg total) by mouth 2 (two) times daily as needed for muscle spasms. 12 tablet 0   Elastic Bandages & Supports (ELBOW COMPRESSION) MISC 1 Application by Does  not apply route daily. 1 each 0   fluconazole (DIFLUCAN) 200 MG tablet Take 1 tablet (200 mg total) by mouth every 3 (three) days as needed. 2 tablet 0   glucose blood (ONETOUCH VERIO) test strip USE TO CHECK BLOOD SUGAR THREE TIMES DAILY. 100 each 2   hydrochlorothiazide (HYDRODIURIL) 25 MG tablet Take 1 tablet (25 mg total) by mouth daily. 90 tablet 1   hydrOXYzine (ATARAX) 25 MG tablet Take 1 tablet (25 mg total) by mouth 3 (three) times daily as needed. 90 tablet 0   Insulin Glargine (BASAGLAR KWIKPEN) 100 UNIT/ML Inject 15 Units into the skin daily. 15 mL 0   insulin lispro (HUMALOG KWIKPEN) 100 UNIT/ML KwikPen INJECT up to 12u INTO THE SKIN 3 TIMES DAILY WITH MEALS per sliding scale: 200-250: 4 UNITS, 251-300: 6 UNITS, 301-350: 8 UNITS, 351-400: 10 UNITS,>400:12 UNITS AND CALL NP. Max daily dose: 36u 30 mL 0   Insulin Pen Needle (PEN NEEDLES) 32G X 5 MM MISC Use to inject sliding scale Humalog three times daily. 100 each 6   JARDIANCE 10 MG TABS tablet Take 10 mg by mouth daily.     lidocaine (LIDODERM) 5 % Place 1 patch onto the skin daily. Remove & Discard patch within 12 hours or as directed by MD 30 patch 0   LINZESS 145 MCG CAPS capsule Take 1 capsule (145 mcg total) by mouth daily as needed (constipation). 90 capsule 1   lisinopril (ZESTRIL) 20 MG tablet Take 1 tablet (20 mg total) by mouth daily. 90 tablet 1   metFORMIN (GLUCOPHAGE) 500 MG tablet Take 500 mg by mouth daily with breakfast.     metoCLOPramide (REGLAN) 10 MG tablet Take 10 mg by mouth every 6 (six) hours as needed.     metoprolol tartrate (LOPRESSOR) 25 MG tablet TAKE 1 TABLET BY MOUTH TWICE A DAY 180 tablet 1   norethindrone (MICRONOR) 0.35 MG tablet TAKE 1 TABLET BY MOUTH EVERY DAY 84 tablet 0   omeprazole (PRILOSEC) 20 MG capsule TAKE 1 CAPSULE BY MOUTH DAILY IF NEEDED FOR GASTRIC REFLUX 90 capsule 0   ondansetron (ZOFRAN-ODT) 4 MG disintegrating tablet TAKE 1 TABLET BY MOUTH EVERY 8 HOURS AS NEEDED FOR NAUSEA AND  VOMITING 12 tablet 0   OneTouch Delica Lancets 33G MISC USE TO CHECK BLOOD SUGAR THREE TIMES DAILY. 100 each 2   telmisartan (MICARDIS) 80 MG tablet Take 1 tablet by mouth daily.     No current facility-administered medications for this visit.    Medication Side Effects: None  Allergies: No Known Allergies  Past Medical History:  Diagnosis Date   Anxiety    Diabetes mellitus without complication (HCC)    Gastroparesis    Hypertension    Major depressive disorder     Family History  Problem Relation Age of Onset   Hyperlipidemia Mother    Hypertension Mother    Hyperlipidemia Father    Hypertension Father    Diabetes Father    Diabetes Sister    Blindness Sister    Hypertension Brother    Diabetes Brother    Stroke Brother  Diabetes Maternal Aunt    Diabetes Maternal Grandmother    Hypertension Maternal Grandmother    Heart disease Maternal Grandmother    Diabetes Paternal Grandmother    Hypertension Paternal Grandmother    Heart disease Paternal Grandmother     Social History   Socioeconomic History   Marital status: Single    Spouse name: Not on file   Number of children: Not on file   Years of education: Not on file   Highest education level: Not on file  Occupational History   Not on file  Tobacco Use   Smoking status: Some Days    Current packs/day: 0.25    Types: Cigarettes   Smokeless tobacco: Never   Tobacco comments:    "I smoke when I'm drinking"  Vaping Use   Vaping status: Never Used  Substance and Sexual Activity   Alcohol use: Yes    Comment: occ   Drug use: Not Currently    Types: Marijuana   Sexual activity: Not Currently    Partners: Male    Comment: First IC <16, Partners >5, Hx of CT+, GC+  Other Topics Concern   Not on file  Social History Narrative   Not on file   Social Drivers of Health   Financial Resource Strain: Not on file  Food Insecurity: Low Risk  (05/18/2023)   Received from [facility]   Hunger Vital Sign     Worried About Running Out of Food in the Last Year: Never true    Ran Out of Food in the Last Year: Never true  Transportation Needs: No Transportation Needs (05/18/2023)   Received from [facility]    In the past 12 months, has lack of reliable transportation kept you from medical appointments, meetings, work or from getting things needed for daily living? : No  Physical Activity: Not on file  Stress: Not on file  Social Connections: Unknown (08/25/2021)   Received from [facility], [facility]   Social Network    Social Network: Not on file  Intimate Partner Violence: Unknown (07/30/2021)   Received from [facility], [facility]   HITS    Physically Hurt: Not on file    Insult or Talk Down To: Not on file    Threaten Physical Harm: Not on file    Scream or Curse: Not on file    Past Medical History, Surgical history, Social history, and Family history were reviewed and updated as appropriate.   Please see review of systems for further details on the patient's review from today.   Objective:   Physical Exam:  There were no vitals taken for this visit.  Physical Exam Constitutional:      General: She is not in acute distress. Musculoskeletal:        General: No deformity.  Neurological:     Mental Status: She is alert and oriented to person, place, and time.     Coordination: Coordination normal.  Psychiatric:        Attention and Perception: Attention and perception normal. She does not perceive auditory or visual hallucinations.        Mood and Affect: Affect is not labile, blunt, angry or inappropriate.        Speech: Speech normal.        Behavior: Behavior normal.        Thought Content: Thought content normal. Thought content is not paranoid or delusional. Thought content does not include homicidal or suicidal ideation.  Thought content does not include homicidal or suicidal plan.        Cognition and Memory: Cognition and memory normal.         Judgment: Judgment normal.     Comments: Insight intact     Lab Review:     Component Value Date/Time   NA 131 (L) 05/25/2023 2132   NA 141 08/31/2022 0954   K 3.5 05/25/2023 2132   CL 94 (L) 05/25/2023 2132   CO2 25 05/25/2023 2132   GLUCOSE 314 (H) 05/25/2023 2132   BUN 10 05/25/2023 2132   BUN 12 08/31/2022 0954   CREATININE 0.87 05/25/2023 2132   CALCIUM 9.5 05/25/2023 2132   PROT 8.4 (H) 05/25/2023 2132   PROT 7.6 08/31/2022 0954   ALBUMIN 4.4 05/25/2023 2132   ALBUMIN 4.3 08/31/2022 0954   AST 71 (H) 05/25/2023 2132   ALT 86 (H) 05/25/2023 2132   ALKPHOS 70 05/25/2023 2132   BILITOT 1.0 05/25/2023 2132   BILITOT 0.3 08/31/2022 0954   GFRNONAA >60 05/25/2023 2132   GFRAA >60 10/10/2019 1441       Component Value Date/Time   WBC 11.8 (H) 05/25/2023 2132   RBC 4.51 05/25/2023 2132   HGB 15.1 (H) 05/25/2023 2132   HGB 13.9 08/31/2022 0954   HCT 42.4 05/25/2023 2132   HCT 41.0 08/31/2022 0954   PLT 187 05/25/2023 2132   PLT 249 08/31/2022 0954   MCV 94.0 05/25/2023 2132   MCV 96 08/31/2022 0954   MCH 33.5 05/25/2023 2132   MCHC 35.6 05/25/2023 2132   RDW 12.5 05/25/2023 2132   RDW 12.6 08/31/2022 0954   LYMPHSABS 0.5 (L) 05/25/2023 2132   LYMPHSABS 2.3 08/31/2022 0954   MONOABS 0.5 05/25/2023 2132   EOSABS 0.2 05/25/2023 2132   EOSABS 0.3 08/31/2022 0954   BASOSABS 0.0 05/25/2023 2132   BASOSABS 0.1 08/31/2022 0954    No results found for: "POCLITH", "LITHIUM"   No results found for: "PHENYTOIN", "PHENOBARB", "VALPROATE", "CBMZ"   .res Assessment: Plan:    Plan:  PDMP reviewed  Xanax 1mg  BID as needed  Hydroxyzine 25mg  TID  Consider adding Prozac 10mg  daily  Gene sight testing reviewed   RTC 4 weeks  Will complete workplace accomodation form - unable to drive into office monthly due to anxiety and panic.   15 minutes spent dedicated to the care of this patient on the date of this encounter to include pre-visit review of records,  ordering of medication, post visit documentation, and face-to-face time with the patient discussing MDD, mixed obsessional thoughts, insomnia, panic attacks, and GAD. Discussed continuing current medication regimen.  Patient was advised to contact office with any questions, adverse effects, or acute worsening in signs and symptoms.  Discussed potential benefits, risk, and side effects of benzodiazepines to include potential risk of tolerance and dependence, as well as possible drowsiness. Advised patient not to drive if experiencing drowsiness and to take lowest possible effective dose to minimize risk of dependence and tolerance.  There are no diagnoses linked to this encounter.   Please see After Visit Summary for patient specific instructions.  Future Appointments  Date Time Provider Department Center  07/06/2023 10:00 AM Lark Langenfeld, Thereasa Solo, NP [facility] None    No orders of the defined types were placed in this encounter.     -------------------------------

## 2023-07-26 ENCOUNTER — Other Ambulatory Visit: Payer: Self-pay

## 2023-07-26 DIAGNOSIS — Z30011 Encounter for initial prescription of contraceptive pills: Secondary | ICD-10-CM

## 2023-07-26 NOTE — Telephone Encounter (Signed)
 Med refill request: Norethindrone  Last AEX: 02/10/22 Next AEX: Not scheduled message sent to scheduling department  Last MMG (if hormonal med) 2019 care everywhere Refill authorized: Last rx 02/08/23 #84 with 0 refills. Please approve or deny

## 2023-08-01 ENCOUNTER — Other Ambulatory Visit: Payer: Self-pay

## 2023-08-01 DIAGNOSIS — Z30011 Encounter for initial prescription of contraceptive pills: Secondary | ICD-10-CM

## 2023-08-01 MED ORDER — NORETHINDRONE 0.35 MG PO TABS: 1.0000 | ORAL_TABLET | Freq: Every day | ORAL | 0 refills | Status: DC

## 2023-08-01 NOTE — Telephone Encounter (Signed)
 Med refill request: Micronor Last AEX: 02/10/22 Next AEX:  11/08/23 Last MMG (if hormonal med) 2019 care everywhere Refill authorized: #84 with 0 RF. Please approve or deny

## 2023-08-05 ENCOUNTER — Other Ambulatory Visit: Payer: Self-pay

## 2023-08-05 DIAGNOSIS — Z30011 Encounter for initial prescription of contraceptive pills: Secondary | ICD-10-CM

## 2023-08-05 MED ORDER — NORETHINDRONE 0.35 MG PO TABS: 1.0000 | ORAL_TABLET | Freq: Every day | ORAL | 0 refills | Status: DC

## 2023-08-05 NOTE — Telephone Encounter (Signed)
 Med refill request: Norethindrone  Last AEX: 02/10/22 Next AEX: 11/08/23 Last MMG (if hormonal med)2019 care everywhere Refill authorized: Rx was just sent 08/01/23 #84 with 0 refills. New request has been sent changing the pharmacy to [facility].  Routing to provider for review

## 2023-08-08 ENCOUNTER — Other Ambulatory Visit: Payer: Self-pay | Admitting: Adult Health

## 2023-08-08 DIAGNOSIS — F410 Panic disorder [episodic paroxysmal anxiety] without agoraphobia: Secondary | ICD-10-CM

## 2023-08-08 DIAGNOSIS — G4700 Insomnia, unspecified: Secondary | ICD-10-CM

## 2023-09-09 ENCOUNTER — Other Ambulatory Visit: Payer: Self-pay | Admitting: Adult Health

## 2023-09-09 DIAGNOSIS — G4700 Insomnia, unspecified: Secondary | ICD-10-CM

## 2023-09-09 DIAGNOSIS — F410 Panic disorder [episodic paroxysmal anxiety] without agoraphobia: Secondary | ICD-10-CM

## 2023-09-09 NOTE — Telephone Encounter (Signed)
 Past due for FUP. Sent MyChart message to schedule.

## 2023-09-09 NOTE — Telephone Encounter (Signed)
 Pt called and made an appt for 09/21/23

## 2023-09-21 ENCOUNTER — Telehealth: Admitting: Adult Health

## 2023-09-21 ENCOUNTER — Encounter: Payer: Self-pay | Admitting: Adult Health

## 2023-09-21 DIAGNOSIS — F410 Panic disorder [episodic paroxysmal anxiety] without agoraphobia: Secondary | ICD-10-CM

## 2023-09-21 DIAGNOSIS — F411 Generalized anxiety disorder: Secondary | ICD-10-CM | POA: Diagnosis not present

## 2023-09-21 DIAGNOSIS — G4700 Insomnia, unspecified: Secondary | ICD-10-CM

## 2023-09-21 DIAGNOSIS — F422 Mixed obsessional thoughts and acts: Secondary | ICD-10-CM

## 2023-09-21 DIAGNOSIS — F331 Major depressive disorder, recurrent, moderate: Secondary | ICD-10-CM | POA: Diagnosis not present

## 2023-09-21 MED ORDER — ALPRAZOLAM 1 MG PO TABS
1.0000 mg | ORAL_TABLET | Freq: Two times a day (BID) | ORAL | 0 refills | Status: DC | PRN
Start: 2023-09-21 — End: 2023-11-07

## 2023-09-21 NOTE — Progress Notes (Signed)
 Gwendolyn Carrillo [id number] 01/14/1976 48 y.o.  Virtual Visit via Video Note  I connected with pt @ on 09/21/23 at 10:00 AM EDT by a video enabled telemedicine application and verified that I am speaking with the correct person using two identifiers.   I discussed the limitations of evaluation and management by telemedicine and the availability of in person appointments. The patient expressed understanding and agreed to proceed.  I discussed the assessment and treatment plan with the patient. The patient was provided an opportunity to ask questions and all were answered. The patient agreed with the plan and demonstrated an understanding of the instructions.   The patient was advised to call back or seek an in-person evaluation if the symptoms worsen or if the condition fails to improve as anticipated.  I provided 25 minutes of non-face-to-face time during this encounter.  The patient was located at home.  The provider was located at [facility].   Reagan Camera, NP   Subjective:   Patient ID:  Gwendolyn Carrillo is a 48 y.o. (DOB 12-17-1975) female.  Chief Complaint: No chief complaint on file.   HPI Gwendolyn Carrillo presents for follow-up of MDD, mixed obsessional thoughts, insomnia, panic attacks, and GAD.   Describes mood today as "ok". Reports tearfulness. Pleasant. Mood symptoms - reports depression - "more sadness" and anxiety - "struggles with anticipation". Reports improved interest and motivation. Denies irritability. Denies recent panic attacks - "not too often". Reports worry, rumination and over thinking. Reports ongoing situational stressors - family - work. Reports getting out more than she was. Went to a family barbecue this past weekend. Reports mood is stable. Stating "I feel like I'm just ok - numb". Reports taking Xanax  twice daily. Taking medications as prescribed.  Energy levels vary - "it comes and goes". Active, does not have a regular exercise  routine - taking walks.  Able to enjoy usual interests and activities. Single. Lives alone. Has 4 children. Family local. Spending time with family.  Appetite adequate. Weight loss - 186 pounds.  Reports sleeping better some nights than others. Averages 5 hours. Reports focus and concentration stable. Completing minimal tasks. Managing some aspects of household. Working full time - remotely - [employer]. Denies SI or HI.  Denies AH or VH. Denies self harm. Denies substance use.  Previous medications: Zoloft , Clonazepam   Review of Systems:  Review of Systems  Musculoskeletal:  Negative for gait problem.  Neurological:  Negative for tremors.  Psychiatric/Behavioral:         Please refer to HPI    Medications: I have reviewed the patient's current medications.  Current Outpatient Medications  Medication Sig Dispense Refill   ALPRAZolam  (XANAX ) 1 MG tablet Take 1 tablet (1 mg total) by mouth 2 (two) times daily as needed for anxiety. 60 tablet 0   atorvastatin  (LIPITOR) 80 MG tablet Take 1 tablet (80 mg total) by mouth daily. 90 tablet 1   Blood Glucose Monitoring Suppl (ONETOUCH VERIO) w/Device KIT USE TO CHECK BLOOD SUGAR THREE TIMES DAILY. 1 kit 0   Cholecalciferol (VITAMIN D3) 50 MCG (2000 UT) capsule Take 1 capsule (2,000 Units total) by mouth daily. (Patient not taking: Reported on 02/10/2022) 90 capsule 1   clindamycin  (CLEOCIN ) 2 % vaginal cream Place 1 Applicatorful vaginally at bedtime. 40 g 0   Continuous Blood Gluc Receiver (DEXCOM G6 RECEIVER) DEVI 1 Bag by Does not apply route in the morning, at noon, and at bedtime. 1 each 0   Continuous  Glucose Sensor (DEXCOM G6 SENSOR) MISC USE AS DIRECTED EVERY 10 DAYS 9 each 2   Continuous Glucose Transmitter (DEXCOM G6 TRANSMITTER) MISC USE AS DIRECTED OVER 90 DAYS 1 each 2   cyclobenzaprine  (FLEXERIL ) 10 MG tablet Take 1 tablet (10 mg total) by mouth 2 (two) times daily as needed for muscle spasms. 12 tablet 0   Elastic Bandages &  Supports (ELBOW COMPRESSION) MISC 1 Application by Does not apply route daily. 1 each 0   fluconazole  (DIFLUCAN ) 200 MG tablet Take 1 tablet (200 mg total) by mouth every 3 (three) days as needed. 2 tablet 0   glucose blood (ONETOUCH VERIO) test strip USE TO CHECK BLOOD SUGAR THREE TIMES DAILY. 100 each 2   hydrochlorothiazide  (HYDRODIURIL ) 25 MG tablet Take 1 tablet (25 mg total) by mouth daily. 90 tablet 1   hydrOXYzine  (ATARAX ) 25 MG tablet Take 1 tablet (25 mg total) by mouth 3 (three) times daily as needed. 90 tablet 1   Insulin  Glargine (BASAGLAR  KWIKPEN) 100 UNIT/ML Inject 15 Units into the skin daily. 15 mL 0   insulin  lispro (HUMALOG  KWIKPEN) 100 UNIT/ML KwikPen INJECT up to 12u INTO THE SKIN 3 TIMES DAILY WITH MEALS per sliding scale: 200-250: 4 UNITS, 251-300: 6 UNITS, 301-350: 8 UNITS, 351-400: 10 UNITS,>400:12 UNITS AND CALL NP. Max daily dose: 36u 30 mL 0   Insulin  Pen Needle (PEN NEEDLES) 32G X 5 MM MISC Use to inject sliding scale Humalog  three times daily. 100 each 6   JARDIANCE  10 MG TABS tablet Take 10 mg by mouth daily.     lidocaine  (LIDODERM ) 5 % Place 1 patch onto the skin daily. Remove & Discard patch within 12 hours or as directed by MD 30 patch 0   LINZESS  145 MCG CAPS capsule Take 1 capsule (145 mcg total) by mouth daily as needed (constipation). 90 capsule 1   lisinopril  (ZESTRIL ) 20 MG tablet Take 1 tablet (20 mg total) by mouth daily. 90 tablet 1   metFORMIN (GLUCOPHAGE) 500 MG tablet Take 500 mg by mouth daily with breakfast.     metoCLOPramide  (REGLAN ) 10 MG tablet Take 10 mg by mouth every 6 (six) hours as needed.     metoprolol  tartrate (LOPRESSOR ) 25 MG tablet TAKE 1 TABLET BY MOUTH TWICE A DAY 180 tablet 1   norethindrone  (MICRONOR ) 0.35 MG tablet Take 1 tablet (0.35 mg total) by mouth daily. 84 tablet 0   omeprazole  (PRILOSEC) 20 MG capsule TAKE 1 CAPSULE BY MOUTH DAILY IF NEEDED FOR GASTRIC REFLUX 90 capsule 0   ondansetron  (ZOFRAN -ODT) 4 MG disintegrating  tablet TAKE 1 TABLET BY MOUTH EVERY 8 HOURS AS NEEDED FOR NAUSEA AND VOMITING 12 tablet 0   OneTouch Delica Lancets 33G MISC USE TO CHECK BLOOD SUGAR THREE TIMES DAILY. 100 each 2   telmisartan (MICARDIS) 80 MG tablet Take 1 tablet by mouth daily.     No current facility-administered medications for this visit.    Medication Side Effects: None  Allergies: No Known Allergies  Past Medical History:  Diagnosis Date   Anxiety    Diabetes mellitus without complication (HCC)    Gastroparesis    Hypertension    Major depressive disorder     Family History  Problem Relation Age of Onset   Hyperlipidemia Mother    Hypertension Mother    Hyperlipidemia Father    Hypertension Father    Diabetes Father    Diabetes Sister    Blindness Sister    Hypertension Brother  Diabetes Brother    Stroke Brother    Diabetes Maternal Aunt    Diabetes Maternal Grandmother    Hypertension Maternal Grandmother    Heart disease Maternal Grandmother    Diabetes Paternal Grandmother    Hypertension Paternal Grandmother    Heart disease Paternal Grandmother     Social History   Socioeconomic History   Marital status: Single    Spouse name: Not on file   Number of children: Not on file   Years of education: Not on file   Highest education level: Not on file  Occupational History   Not on file  Tobacco Use   Smoking status: Some Days    Current packs/day: 0.25    Types: Cigarettes   Smokeless tobacco: Never   Tobacco comments:    "I smoke when I'm drinking"  Vaping Use   Vaping status: Never Used  Substance and Sexual Activity   Alcohol use: Yes    Comment: occ   Drug use: Not Currently    Types: Marijuana   Sexual activity: Not Currently    Partners: Male    Comment: First IC <16, Partners >5, Hx of CT+, GC+  Other Topics Concern   Not on file  Social History Narrative   Not on file   Social Drivers of Health   Financial Resource Strain: Not on file  Food Insecurity: Low  Risk  (08/05/2023)   Received from [facility]   Hunger Vital Sign    Worried About Running Out of Food in the Last Year: Never true    Ran Out of Food in the Last Year: Never true  Transportation Needs: No Transportation Needs (08/05/2023)   Received from [facility]    In the past 12 months, has lack of reliable transportation kept you from medical appointments, meetings, work or from getting things needed for daily living? : No  Physical Activity: Not on file  Stress: Not on file  Social Connections: Unknown (08/25/2021)   Received from [facility]   Social Network    Social Network: Not on file  Intimate Partner Violence: Unknown (07/30/2021)   Received from [facility]   HITS    Physically Hurt: Not on file    Insult or Talk Down To: Not on file    Threaten Physical Harm: Not on file    Scream or Curse: Not on file    Past Medical History, Surgical history, Social history, and Family history were reviewed and updated as appropriate.   Please see review of systems for further details on the patient's review from today.   Objective:   Physical Exam:  There were no vitals taken for this visit.  Physical Exam Constitutional:      General: She is not in acute distress. Musculoskeletal:        General: No deformity.  Neurological:     Mental Status: She is alert and oriented to person, place, and time.     Coordination: Coordination normal.  Psychiatric:        Attention and Perception: Attention and perception normal. She does not perceive auditory or visual hallucinations.        Mood and Affect: Mood is anxious and depressed. Affect is not labile, blunt, angry or inappropriate.        Speech: Speech normal.        Behavior: Behavior normal.        Thought Content: Thought content normal. Thought  content is not paranoid or delusional. Thought content does not include homicidal or suicidal ideation. Thought content does  not include homicidal or suicidal plan.        Cognition and Memory: Cognition and memory normal.        Judgment: Judgment normal.     Comments: Insight intact     Lab Review:     Component Value Date/Time   NA 131 (L) 05/25/2023 2132   NA 141 08/31/2022 0954   K 3.5 05/25/2023 2132   CL 94 (L) 05/25/2023 2132   CO2 25 05/25/2023 2132   GLUCOSE 314 (H) 05/25/2023 2132   BUN 10 05/25/2023 2132   BUN 12 08/31/2022 0954   CREATININE 0.87 05/25/2023 2132   CALCIUM  9.5 05/25/2023 2132   PROT 8.4 (H) 05/25/2023 2132   PROT 7.6 08/31/2022 0954   ALBUMIN 4.4 05/25/2023 2132   ALBUMIN 4.3 08/31/2022 0954   AST 71 (H) 05/25/2023 2132   ALT 86 (H) 05/25/2023 2132   ALKPHOS 70 05/25/2023 2132   BILITOT 1.0 05/25/2023 2132   BILITOT 0.3 08/31/2022 0954   GFRNONAA >60 05/25/2023 2132   GFRAA >60 10/10/2019 1441       Component Value Date/Time   WBC 11.8 (H) 05/25/2023 2132   RBC 4.51 05/25/2023 2132   HGB 15.1 (H) 05/25/2023 2132   HGB 13.9 08/31/2022 0954   HCT 42.4 05/25/2023 2132   HCT 41.0 08/31/2022 0954   PLT 187 05/25/2023 2132   PLT 249 08/31/2022 0954   MCV 94.0 05/25/2023 2132   MCV 96 08/31/2022 0954   MCH 33.5 05/25/2023 2132   MCHC 35.6 05/25/2023 2132   RDW 12.5 05/25/2023 2132   RDW 12.6 08/31/2022 0954   LYMPHSABS 0.5 (L) 05/25/2023 2132   LYMPHSABS 2.3 08/31/2022 0954   MONOABS 0.5 05/25/2023 2132   EOSABS 0.2 05/25/2023 2132   EOSABS 0.3 08/31/2022 0954   BASOSABS 0.0 05/25/2023 2132   BASOSABS 0.1 08/31/2022 0954    No results found for: "POCLITH", "LITHIUM"   No results found for: "PHENYTOIN", "PHENOBARB", "VALPROATE", "CBMZ"   .res Assessment: Plan:    Plan:  PDMP reviewed  Xanax  1mg  BID as needed  Hydroxyzine  25mg  TID  Consider adding Prozac  10mg  daily  Gene sight testing reviewed   Working with a therapist.  RTC 4 weeks  Completed workplace accomodation form - unable to drive into office monthly due to anxiety and panic.    15 minutes spent dedicated to the care of this patient on the date of this encounter to include pre-visit review of records, ordering of medication, post visit documentation, and face-to-face time with the patient discussing MDD, mixed obsessional thoughts, insomnia, panic attacks, and GAD. Discussed continuing current medication regimen.  Patient was advised to contact office with any questions, adverse effects, or acute worsening in signs and symptoms.  Discussed potential benefits, risk, and side effects of benzodiazepines to include potential risk of tolerance and dependence, as well as possible drowsiness. Advised patient not to drive if experiencing drowsiness and to take lowest possible effective dose to minimize risk of dependence and tolerance.  Diagnoses and all orders for this visit:  Major depressive disorder, recurrent episode, moderate (HCC)  Generalized anxiety disorder  Panic attacks -     ALPRAZolam  (XANAX ) 1 MG tablet; Take 1 tablet (1 mg total) by mouth 2 (two) times daily as needed for anxiety.  Mixed obsessional thoughts and acts  Insomnia, unspecified type -     ALPRAZolam  (XANAX )  1 MG tablet; Take 1 tablet (1 mg total) by mouth 2 (two) times daily as needed for anxiety.     Please see After Visit Summary for patient specific instructions.  Future Appointments  Date Time Provider Department Center  11/08/2023  3:00 PM Antonio Klinefelter A, NP [facility] None    No orders of the defined types were placed in this encounter.     -------------------------------

## 2023-09-28 ENCOUNTER — Encounter: Payer: Self-pay | Admitting: Adult Health

## 2023-09-28 ENCOUNTER — Telehealth: Admitting: Adult Health

## 2023-09-28 DIAGNOSIS — F331 Major depressive disorder, recurrent, moderate: Secondary | ICD-10-CM

## 2023-09-28 DIAGNOSIS — F411 Generalized anxiety disorder: Secondary | ICD-10-CM

## 2023-09-28 DIAGNOSIS — F410 Panic disorder [episodic paroxysmal anxiety] without agoraphobia: Secondary | ICD-10-CM | POA: Diagnosis not present

## 2023-09-28 DIAGNOSIS — F339 Major depressive disorder, recurrent, unspecified: Secondary | ICD-10-CM | POA: Diagnosis not present

## 2023-09-28 DIAGNOSIS — G4700 Insomnia, unspecified: Secondary | ICD-10-CM

## 2023-09-28 DIAGNOSIS — F422 Mixed obsessional thoughts and acts: Secondary | ICD-10-CM | POA: Diagnosis not present

## 2023-09-28 MED ORDER — FLUOXETINE HCL 10 MG PO CAPS: 10.0000 mg | ORAL_CAPSULE | Freq: Every day | ORAL | 2 refills | Status: DC

## 2023-09-28 NOTE — Progress Notes (Signed)
 Gwendolyn Carrillo [id number] [birth year] 48 y.o.  Virtual Visit via Video Note  I connected with pt @ on 09/28/23 at  5:00 PM EDT by a video enabled telemedicine application and verified that I am speaking with the correct person using two identifiers.   I discussed the limitations of evaluation and management by telemedicine and the availability of in person appointments. The patient expressed understanding and agreed to proceed.  I discussed the assessment and treatment plan with the patient. The patient was provided an opportunity to ask questions and all were answered. The patient agreed with the plan and demonstrated an understanding of the instructions.   The patient was advised to call back or seek an in-person evaluation if the symptoms worsen or if the condition fails to improve as anticipated.  I provided 35 minutes of non-face-to-face time during this encounter.  The patient was located at home.  The provider was located at [facility].   Reagan Camera, NP   Subjective:   Patient ID:  Gwendolyn Carrillo is a 48 y.o. (DOB 09-28-1975) female.  Chief Complaint: No chief complaint on file.   HPI Gwendolyn Carrillo presents for follow-up of MDD, mixed obsessional thoughts, insomnia, panic attacks, and GAD.   Describes mood today as "not good". Flat. Reports tearfulness - tearful throughout interview. Mood symptoms - reports increased depression and   anxiety - "reports possibly hormonal". Has a gynecology appointment in July to discuss changes. Reports decreased interest and motivation. Denies irritability. Denies recent panic attacks. Reports worry, rumination and over thinking. Reports ongoing situational stressors - family - work. Stating "I'm not doing too good". Stating "I feel like I have a boulder on my shoulder". Reports her anxiety is so bad she's having to use breathing exercises to get through the day - "that has helped". Stating "I feel like I'm in a cardboard  box and can't move". Reports struggling in the work setting. Reports working on a different approach. Working on self help strategies - learning to meditate. Has started taking walks. Reports mood is lower. Stating "I feel like I'm lost, lonely and stagnant. Reports seeing a therapist - Gwendolyn Carrillo - [place]. Reports taking Xanax  twice daily and Hydroxyzine  on the weekend. Willing to consider al low dose of Prozac  for mood symptoms. Taking other medications as prescribed.  Energy levels low. Active, does not have a regular exercise routine - taking walks.  She is unable to enjoy usual interests and activities. Single. Lives alone. Has 4 children. Family local. Spending time with family.  Appetite adequate. Weight stable - 186 pounds.  Reports sleeping difficulties during the week. Stating "I can't sleep - I'm sleep deprived". Averages 3 to 4 hours during the week. Reports taking the Hydroxzine 50mg  at bedtime on Friday night and sleeps most of the weekend. Reports focus and concentration difficulties. Completing minimal tasks. Managing some aspects of household. Working full time - remotely - [employer]. Denies SI or HI.  Denies AH or VH. Denies self harm. Denies substance use. Denies alcohol use.  Previous medications: Zoloft , Clonazepam    Review of Systems:  Review of Systems  Musculoskeletal:  Negative for gait problem.  Neurological:  Negative for tremors.  Psychiatric/Behavioral:         Please refer to HPI    Medications: I have reviewed the patient's current medications.  Current Outpatient Medications  Medication Sig Dispense Refill   ALPRAZolam  (XANAX ) 1 MG tablet Take 1 tablet (1 mg total) by mouth 2 (  two) times daily as needed for anxiety. 60 tablet 0   atorvastatin  (LIPITOR) 80 MG tablet Take 1 tablet (80 mg total) by mouth daily. 90 tablet 1   Blood Glucose Monitoring Suppl (ONETOUCH VERIO) w/Device KIT USE TO CHECK BLOOD SUGAR THREE TIMES DAILY. 1 kit 0   Cholecalciferol (VITAMIN  D3) 50 MCG (2000 UT) capsule Take 1 capsule (2,000 Units total) by mouth daily. (Patient not taking: Reported on 02/10/2022) 90 capsule 1   clindamycin  (CLEOCIN ) 2 % vaginal cream Place 1 Applicatorful vaginally at bedtime. 40 g 0   Continuous Blood Gluc Receiver (DEXCOM G6 RECEIVER) DEVI 1 Bag by Does not apply route in the morning, at noon, and at bedtime. 1 each 0   Continuous Glucose Sensor (DEXCOM G6 SENSOR) MISC USE AS DIRECTED EVERY 10 DAYS 9 each 2   Continuous Glucose Transmitter (DEXCOM G6 TRANSMITTER) MISC USE AS DIRECTED OVER 90 DAYS 1 each 2   cyclobenzaprine  (FLEXERIL ) 10 MG tablet Take 1 tablet (10 mg total) by mouth 2 (two) times daily as needed for muscle spasms. 12 tablet 0   Elastic Bandages & Supports (ELBOW COMPRESSION) MISC 1 Application by Does not apply route daily. 1 each 0   fluconazole  (DIFLUCAN ) 200 MG tablet Take 1 tablet (200 mg total) by mouth every 3 (three) days as needed. 2 tablet 0   glucose blood (ONETOUCH VERIO) test strip USE TO CHECK BLOOD SUGAR THREE TIMES DAILY. 100 each 2   hydrochlorothiazide  (HYDRODIURIL ) 25 MG tablet Take 1 tablet (25 mg total) by mouth daily. 90 tablet 1   hydrOXYzine  (ATARAX ) 25 MG tablet Take 1 tablet (25 mg total) by mouth 3 (three) times daily as needed. 90 tablet 1   Insulin  Glargine (BASAGLAR  KWIKPEN) 100 UNIT/ML Inject 15 Units into the skin daily. 15 mL 0   insulin  lispro (HUMALOG  KWIKPEN) 100 UNIT/ML KwikPen INJECT up to 12u INTO THE SKIN 3 TIMES DAILY WITH MEALS per sliding scale: 200-250: 4 UNITS, 251-300: 6 UNITS, 301-350: 8 UNITS, 351-400: 10 UNITS,>400:12 UNITS AND CALL NP. Max daily dose: 36u 30 mL 0   Insulin  Pen Needle (PEN NEEDLES) 32G X 5 MM MISC Use to inject sliding scale Humalog  three times daily. 100 each 6   JARDIANCE  10 MG TABS tablet Take 10 mg by mouth daily.     lidocaine  (LIDODERM ) 5 % Place 1 patch onto the skin daily. Remove & Discard patch within 12 hours or as directed by MD 30 patch 0   LINZESS  145 MCG  CAPS capsule Take 1 capsule (145 mcg total) by mouth daily as needed (constipation). 90 capsule 1   lisinopril  (ZESTRIL ) 20 MG tablet Take 1 tablet (20 mg total) by mouth daily. 90 tablet 1   metFORMIN (GLUCOPHAGE) 500 MG tablet Take 500 mg by mouth daily with breakfast.     metoCLOPramide  (REGLAN ) 10 MG tablet Take 10 mg by mouth every 6 (six) hours as needed.     metoprolol  tartrate (LOPRESSOR ) 25 MG tablet TAKE 1 TABLET BY MOUTH TWICE A DAY 180 tablet 1   norethindrone  (MICRONOR ) 0.35 MG tablet Take 1 tablet (0.35 mg total) by mouth daily. 84 tablet 0   omeprazole  (PRILOSEC) 20 MG capsule TAKE 1 CAPSULE BY MOUTH DAILY IF NEEDED FOR GASTRIC REFLUX 90 capsule 0   ondansetron  (ZOFRAN -ODT) 4 MG disintegrating tablet TAKE 1 TABLET BY MOUTH EVERY 8 HOURS AS NEEDED FOR NAUSEA AND VOMITING 12 tablet 0   OneTouch Delica Lancets 33G MISC USE TO CHECK BLOOD SUGAR  THREE TIMES DAILY. 100 each 2   telmisartan (MICARDIS) 80 MG tablet Take 1 tablet by mouth daily.     No current facility-administered medications for this visit.    Medication Side Effects: None  Allergies: No Known Allergies  Past Medical History:  Diagnosis Date   Anxiety    Diabetes mellitus without complication (HCC)    Gastroparesis    Hypertension    Major depressive disorder     Family History  Problem Relation Age of Onset   Hyperlipidemia Mother    Hypertension Mother    Hyperlipidemia Father    Hypertension Father    Diabetes Father    Diabetes Sister    Blindness Sister    Hypertension Brother    Diabetes Brother    Stroke Brother    Diabetes Maternal Aunt    Diabetes Maternal Grandmother    Hypertension Maternal Grandmother    Heart disease Maternal Grandmother    Diabetes Paternal Grandmother    Hypertension Paternal Grandmother    Heart disease Paternal Grandmother     Social History   Socioeconomic History   Marital status: Single    Spouse name: Not on file   Number of children: Not on file    Years of education: Not on file   Highest education level: Not on file  Occupational History   Not on file  Tobacco Use   Smoking status: Some Days    Current packs/day: 0.25    Types: Cigarettes   Smokeless tobacco: Never   Tobacco comments:    "I smoke when I'm drinking"  Vaping Use   Vaping status: Never Used  Substance and Sexual Activity   Alcohol use: Yes    Comment: occ   Drug use: Not Currently    Types: Marijuana   Sexual activity: Not Currently    Partners: Male    Comment: First IC <16, Partners >5, Hx of CT+, GC+  Other Topics Concern   Not on file  Social History Narrative   Not on file   Social Drivers of Health   Financial Resource Strain: Not on file  Food Insecurity: Low Risk  (08/05/2023)   Received from [facility]   Hunger Vital Sign    Worried About Running Out of Food in the Last Year: Never true    Ran Out of Food in the Last Year: Never true  Transportation Needs: No Transportation Needs (08/05/2023)   Received from [facility]    In the past 12 months, has lack of reliable transportation kept you from medical appointments, meetings, work or from getting things needed for daily living? : No  Physical Activity: Not on file  Stress: Not on file  Social Connections: Unknown (08/25/2021)   Received from [facility], [facility]   Social Network    Social Network: Not on file  Intimate Partner Violence: Unknown (07/30/2021)   Received from [facility], [facility]   HITS    Physically Hurt: Not on file    Insult or Talk Down To: Not on file    Threaten Physical Harm: Not on file    Scream or Curse: Not on file    Past Medical History, Surgical history, Social history, and Family history were reviewed and updated as appropriate.   Please see review of systems for further details on the patient's review from today.   Objective:   Physical Exam:  There were no vitals taken for this visit.  Physical  Exam  Constitutional:      General: She is not in acute distress. Musculoskeletal:        General: No deformity.  Neurological:     Mental Status: She is alert and oriented to person, place, and time.     Coordination: Coordination normal.  Psychiatric:        Attention and Perception: Attention and perception normal. She does not perceive auditory or visual hallucinations.        Mood and Affect: Mood is anxious and depressed. Affect is labile, flat and tearful. Affect is not blunt, angry or inappropriate.        Speech: Speech normal.        Behavior: Behavior is slowed and withdrawn.        Thought Content: Thought content is not paranoid or delusional. Thought content does not include suicidal ideation. Thought content does not include suicidal plan.        Cognition and Memory: Cognition and memory normal.     Comments: Insight intact     Lab Review:     Component Value Date/Time   NA 131 (L) 05/25/2023 2132   NA 141 08/31/2022 0954   K 3.5 05/25/2023 2132   CL 94 (L) 05/25/2023 2132   CO2 25 05/25/2023 2132   GLUCOSE 314 (H) 05/25/2023 2132   BUN 10 05/25/2023 2132   BUN 12 08/31/2022 0954   CREATININE 0.87 05/25/2023 2132   CALCIUM  9.5 05/25/2023 2132   PROT 8.4 (H) 05/25/2023 2132   PROT 7.6 08/31/2022 0954   ALBUMIN 4.4 05/25/2023 2132   ALBUMIN 4.3 08/31/2022 0954   AST 71 (H) 05/25/2023 2132   ALT 86 (H) 05/25/2023 2132   ALKPHOS 70 05/25/2023 2132   BILITOT 1.0 05/25/2023 2132   BILITOT 0.3 08/31/2022 0954   GFRNONAA >60 05/25/2023 2132   GFRAA >60 10/10/2019 1441       Component Value Date/Time   WBC 11.8 (H) 05/25/2023 2132   RBC 4.51 05/25/2023 2132   HGB 15.1 (H) 05/25/2023 2132   HGB 13.9 08/31/2022 0954   HCT 42.4 05/25/2023 2132   HCT 41.0 08/31/2022 0954   PLT 187 05/25/2023 2132   PLT 249 08/31/2022 0954   MCV 94.0 05/25/2023 2132   MCV 96 08/31/2022 0954   MCH 33.5 05/25/2023 2132   MCHC 35.6 05/25/2023 2132   RDW 12.5 05/25/2023 2132    RDW 12.6 08/31/2022 0954   LYMPHSABS 0.5 (L) 05/25/2023 2132   LYMPHSABS 2.3 08/31/2022 0954   MONOABS 0.5 05/25/2023 2132   EOSABS 0.2 05/25/2023 2132   EOSABS 0.3 08/31/2022 0954   BASOSABS 0.0 05/25/2023 2132   BASOSABS 0.1 08/31/2022 0954    No results found for: "POCLITH", "LITHIUM"   No results found for: "PHENYTOIN", "PHENOBARB", "VALPROATE", "CBMZ"   .res Assessment: Plan:    Plan:  PDMP reviewed  Xanax  1mg  BID as needed  Hydroxyzine  25mg  TID  Add Prozac  10mg  daily  Will take patient out of work for mood stabilization effective 09/27/2023 through 10/25/2023  Gene sight testing reviewed   Working with a therapist.  RTC 4 weeks  35 minutes spent dedicated to the care of this patient on the date of this encounter to include pre-visit review of records, ordering of medication, post visit documentation, and face-to-face time with the patient discussing MDD, mixed obsessional thoughts, insomnia, panic attacks, and GAD. Discussed continuing current medication regimen.  Patient was advised to contact office with any questions, adverse effects, or acute worsening in  signs and symptoms.  Discussed potential benefits, risk, and side effects of benzodiazepines to include potential risk of tolerance and dependence, as well as possible drowsiness. Advised patient not to drive if experiencing drowsiness and to take lowest possible effective dose to minimize risk of dependence and tolerance.  There are no diagnoses linked to this encounter.   Please see After Visit Summary for patient specific instructions.  Future Appointments  Date Time Provider Department Center  09/28/2023  5:00 PM Darlys Buis Nattalie, NP [facility] None  11/08/2023  3:00 PM Antonio Klinefelter A, NP [facility] None    No orders of the defined types were placed in this encounter.     -------------------------------

## 2023-10-06 ENCOUNTER — Telehealth: Payer: Self-pay | Admitting: Adult Health

## 2023-10-06 DIAGNOSIS — Z0289 Encounter for other administrative examinations: Secondary | ICD-10-CM

## 2023-10-06 NOTE — Telephone Encounter (Signed)
 Paperwork faxed and given to admin staff to send requested office notes.

## 2023-10-06 NOTE — Telephone Encounter (Signed)
 Paper work completed.

## 2023-10-06 NOTE — Telephone Encounter (Signed)
 Pt called to pay on Tuesday, 6/10, but we couldn't locate paperwork.  [employer] said they sent it two weeks ago. Pt said she didn't even file it until last Thurs.  Pt has paid $65 for the forms.  The forms are due to be sent by 6/13.  She would like them sent in to [employer] and a copy sent to her in the mail.

## 2023-10-09 ENCOUNTER — Telehealth: Admitting: Family Medicine

## 2023-10-09 DIAGNOSIS — B3731 Acute candidiasis of vulva and vagina: Secondary | ICD-10-CM

## 2023-10-09 MED ORDER — FLUCONAZOLE 150 MG PO TABS: 150.0000 mg | ORAL_TABLET | Freq: Every day | ORAL | 0 refills | Status: AC

## 2023-10-09 MED ORDER — FLUCONAZOLE 150 MG PO TABS: 150.0000 mg | ORAL_TABLET | Freq: Every day | ORAL | 0 refills | Status: DC

## 2023-10-09 MED ORDER — FLUCONAZOLE 200 MG PO TABS: 200.0000 mg | ORAL_TABLET | ORAL | 0 refills | Status: DC | PRN

## 2023-10-09 NOTE — Progress Notes (Signed)

## 2023-10-19 ENCOUNTER — Telehealth: Admitting: Adult Health

## 2023-10-27 ENCOUNTER — Telehealth: Admitting: Adult Health

## 2023-10-27 ENCOUNTER — Encounter: Payer: Self-pay | Admitting: Adult Health

## 2023-10-27 DIAGNOSIS — F422 Mixed obsessional thoughts and acts: Secondary | ICD-10-CM

## 2023-10-27 DIAGNOSIS — F411 Generalized anxiety disorder: Secondary | ICD-10-CM | POA: Diagnosis not present

## 2023-10-27 DIAGNOSIS — F410 Panic disorder [episodic paroxysmal anxiety] without agoraphobia: Secondary | ICD-10-CM | POA: Diagnosis not present

## 2023-10-27 DIAGNOSIS — G4700 Insomnia, unspecified: Secondary | ICD-10-CM

## 2023-10-27 DIAGNOSIS — F331 Major depressive disorder, recurrent, moderate: Secondary | ICD-10-CM

## 2023-10-27 NOTE — Progress Notes (Signed)
 Gwendolyn Carrillo [id number] 12-Dec-1975 48 y.o.  Virtual Visit via Video Note  I connected with pt @ on 10/27/23 at  1:00 PM EDT by a video enabled telemedicine application and verified that I am speaking with the correct person using two identifiers.   I discussed the limitations of evaluation and management by telemedicine and the availability of in person appointments. The patient expressed understanding and agreed to proceed.  I discussed the assessment and treatment plan with the patient. The patient was provided an opportunity to ask questions and all were answered. The patient agreed with the plan and demonstrated an understanding of the instructions.   The patient was advised to call back or seek an in-person evaluation if the symptoms worsen or if the condition fails to improve as anticipated.  I provided 30 minutes of non-face-to-face time during this encounter.  The patient was located at home.  The provider was located at [facility].   Angeline LOISE Sayers, NP   Subjective:   Patient ID:  Gwendolyn Carrillo is a 48 y.o. (DOB [birth year]) female.  Chief Complaint: No chief complaint on file.   HPI Gwendolyn Carrillo presents for follow-up of MDD, mixed obsessional thoughts, insomnia, panic attacks, and GAD.   Describes mood today as about the same. Flat. Reports tearfulness. Mood symptoms - reports depression and anxiety - feeling sad. Feels like there is a hormonal component to mood symptoms. Has a gynecology appointment on July 15th to discuss changes. Reports decreased interest and motivation - about 1% better. Denies irritability. Reports one recent panic attack in her sleep - woke up with a sinking feeling. Reports worry, rumination and over thinking over everything. Reports ongoing situational stressors - family - work. Reports mood is about the same. Stating I still feel stagnant, like I'm alive but not living. Reports trying the Prozac  for 3 days and  did not tolerate it - planning to restart with previous failures. Reports seeing a therapist - Verne[facility] - [place]. Reports taking Xanax  twice daily and Hydroxyzine  on the weekend. Taking medications as prescribed.  Energy levels lower. Active, does not have a regular exercise routine. She is unable to enjoy usual interests and activities. Single. Lives alone. Has 4 children. Family local. Spending time with family.  Appetite adequate. Weight stable - 186 pounds.  Reports sleep has improved. Averages 7 to 10 hours with addition of Hydroxzine. Reports focus and concentration difficulties about the same. Completing minimal tasks. Managing some aspects of household. Typically working full time - remotely - [employer].  Denies SI or HI.  Denies AH or VH. Denies self harm. Denies substance use. Denies alcohol use.  Previous medications: Zoloft , Clonazepam     Review of Systems:  Review of Systems  Musculoskeletal:  Negative for gait problem.  Neurological:  Negative for tremors.  Psychiatric/Behavioral:         Please refer to HPI    Medications: I have reviewed the patient's current medications.  Current Outpatient Medications  Medication Sig Dispense Refill   ALPRAZolam  (XANAX ) 1 MG tablet Take 1 tablet (1 mg total) by mouth 2 (two) times daily as needed for anxiety. 60 tablet 0   atorvastatin  (LIPITOR) 80 MG tablet Take 1 tablet (80 mg total) by mouth daily. 90 tablet 1   Blood Glucose Monitoring Suppl (ONETOUCH VERIO) w/Device KIT USE TO CHECK BLOOD SUGAR THREE TIMES DAILY. 1 kit 0   Cholecalciferol (VITAMIN D3) 50 MCG (2000 UT) capsule Take 1 capsule (2,000 Units total)  by mouth daily. (Patient not taking: Reported on 02/10/2022) 90 capsule 1   clindamycin  (CLEOCIN ) 2 % vaginal cream Place 1 Applicatorful vaginally at bedtime. 40 g 0   Continuous Blood Gluc Receiver (DEXCOM G6 RECEIVER) DEVI 1 Bag by Does not apply route in the morning, at noon, and at bedtime. 1 each 0   Continuous  Glucose Sensor (DEXCOM G6 SENSOR) MISC USE AS DIRECTED EVERY 10 DAYS 9 each 2   Continuous Glucose Transmitter (DEXCOM G6 TRANSMITTER) MISC USE AS DIRECTED OVER 90 DAYS 1 each 2   cyclobenzaprine  (FLEXERIL ) 10 MG tablet Take 1 tablet (10 mg total) by mouth 2 (two) times daily as needed for muscle spasms. 12 tablet 0   Elastic Bandages & Supports (ELBOW COMPRESSION) MISC 1 Application by Does not apply route daily. 1 each 0   FLUoxetine  (PROZAC ) 10 MG capsule Take 1 capsule (10 mg total) by mouth daily. 30 capsule 2   glucose blood (ONETOUCH VERIO) test strip USE TO CHECK BLOOD SUGAR THREE TIMES DAILY. 100 each 2   hydrochlorothiazide  (HYDRODIURIL ) 25 MG tablet Take 1 tablet (25 mg total) by mouth daily. 90 tablet 1   hydrOXYzine  (ATARAX ) 25 MG tablet Take 1 tablet (25 mg total) by mouth 3 (three) times daily as needed. 90 tablet 1   Insulin  Glargine (BASAGLAR  KWIKPEN) 100 UNIT/ML Inject 15 Units into the skin daily. 15 mL 0   insulin  lispro (HUMALOG  KWIKPEN) 100 UNIT/ML KwikPen INJECT up to 12u INTO THE SKIN 3 TIMES DAILY WITH MEALS per sliding scale: 200-250: 4 UNITS, 251-300: 6 UNITS, 301-350: 8 UNITS, 351-400: 10 UNITS,>400:12 UNITS AND CALL NP. Max daily dose: 36u 30 mL 0   Insulin  Pen Needle (PEN NEEDLES) 32G X 5 MM MISC Use to inject sliding scale Humalog  three times daily. 100 each 6   JARDIANCE  10 MG TABS tablet Take 10 mg by mouth daily.     lidocaine  (LIDODERM ) 5 % Place 1 patch onto the skin daily. Remove & Discard patch within 12 hours or as directed by MD 30 patch 0   LINZESS  145 MCG CAPS capsule Take 1 capsule (145 mcg total) by mouth daily as needed (constipation). 90 capsule 1   lisinopril  (ZESTRIL ) 20 MG tablet Take 1 tablet (20 mg total) by mouth daily. 90 tablet 1   metFORMIN (GLUCOPHAGE) 500 MG tablet Take 500 mg by mouth daily with breakfast.     metoCLOPramide  (REGLAN ) 10 MG tablet Take 10 mg by mouth every 6 (six) hours as needed.     metoprolol  tartrate (LOPRESSOR ) 25 MG  tablet TAKE 1 TABLET BY MOUTH TWICE A DAY 180 tablet 1   norethindrone  (MICRONOR ) 0.35 MG tablet Take 1 tablet (0.35 mg total) by mouth daily. 84 tablet 0   omeprazole  (PRILOSEC) 20 MG capsule TAKE 1 CAPSULE BY MOUTH DAILY IF NEEDED FOR GASTRIC REFLUX 90 capsule 0   ondansetron  (ZOFRAN -ODT) 4 MG disintegrating tablet TAKE 1 TABLET BY MOUTH EVERY 8 HOURS AS NEEDED FOR NAUSEA AND VOMITING 12 tablet 0   OneTouch Delica Lancets 33G MISC USE TO CHECK BLOOD SUGAR THREE TIMES DAILY. 100 each 2   telmisartan (MICARDIS) 80 MG tablet Take 1 tablet by mouth daily.     No current facility-administered medications for this visit.    Medication Side Effects: None  Allergies: Not on File  Past Medical History:  Diagnosis Date   Anxiety    Diabetes mellitus without complication (HCC)    Gastroparesis    Hypertension  Major depressive disorder     Family History  Problem Relation Age of Onset   Hyperlipidemia Mother    Hypertension Mother    Hyperlipidemia Father    Hypertension Father    Diabetes Father    Diabetes Sister    Blindness Sister    Hypertension Brother    Diabetes Brother    Stroke Brother    Diabetes Maternal Aunt    Diabetes Maternal Grandmother    Hypertension Maternal Grandmother    Heart disease Maternal Grandmother    Diabetes Paternal Grandmother    Hypertension Paternal Grandmother    Heart disease Paternal Grandmother     Social History   Socioeconomic History   Marital status: Single    Spouse name: Not on file   Number of children: Not on file   Years of education: Not on file   Highest education level: Not on file  Occupational History   Not on file  Tobacco Use   Smoking status: Some Days    Current packs/day: 0.25    Types: Cigarettes   Smokeless tobacco: Never   Tobacco comments:    I smoke when I'm drinking  Vaping Use   Vaping status: Never Used  Substance and Sexual Activity   Alcohol use: Yes    Comment: occ   Drug use: Not  Currently    Types: Marijuana   Sexual activity: Not Currently    Partners: Male    Comment: First IC <16, Partners >5, Hx of CT+, GC+  Other Topics Concern   Not on file  Social History Narrative   Not on file   Social Drivers of Health   Financial Resource Strain: Not on file  Food Insecurity: Low Risk  (08/05/2023)   Received from [facility]   Hunger Vital Sign    Within the past 12 months, you worried that your food would run out before you got money to buy more: Never true    Within the past 12 months, the food you bought just didn't last and you didn't have money to get more. : Never true  Transportation Needs: No Transportation Needs (08/05/2023)   Received from [facility]    In the past 12 months, has lack of reliable transportation kept you from medical appointments, meetings, work or from getting things needed for daily living? : No  Physical Activity: Not on file  Stress: Not on file  Social Connections: Unknown (08/25/2021)   [facility]   Social Network    Social Network: Not on file  Intimate Partner Violence: Unknown (07/30/2021)   Received from [facility]   HITS    Physically Hurt: Not on file    Insult or Talk Down To: Not on file    Threaten Physical Harm: Not on file    Scream or Curse: Not on file    Past Medical History, Surgical history, Social history, and Family history were reviewed and updated as appropriate.   Please see review of systems for further details on the patient's review from today.   Objective:   Physical Exam:  There were no vitals taken for this visit.  Physical Exam Constitutional:      General: She is not in acute distress. Musculoskeletal:        General: No deformity.  Neurological:     Mental Status: She is alert and oriented to person, place, and time.     Coordination: Coordination normal.  Psychiatric:  Attention and Perception: Attention and perception normal. She does  not perceive auditory or visual hallucinations.        Mood and Affect: Mood normal. Mood is not anxious or depressed. Affect is not labile, blunt, angry or inappropriate.        Speech: Speech normal.        Behavior: Behavior normal.        Thought Content: Thought content normal. Thought content is not paranoid or delusional. Thought content does not include homicidal or suicidal ideation. Thought content does not include homicidal or suicidal plan.        Cognition and Memory: Cognition and memory normal.        Judgment: Judgment normal.     Comments: Insight intact     Lab Review:     Component Value Date/Time   NA 131 (L) 05/25/2023 2132   NA 141 08/31/2022 0954   K 3.5 05/25/2023 2132   CL 94 (L) 05/25/2023 2132   CO2 25 05/25/2023 2132   GLUCOSE 314 (H) 05/25/2023 2132   BUN 10 05/25/2023 2132   BUN 12 08/31/2022 0954   CREATININE 0.87 05/25/2023 2132   CALCIUM  9.5 05/25/2023 2132   PROT 8.4 (H) 05/25/2023 2132   PROT 7.6 08/31/2022 0954   ALBUMIN 4.4 05/25/2023 2132   ALBUMIN 4.3 08/31/2022 0954   AST 71 (H) 05/25/2023 2132   ALT 86 (H) 05/25/2023 2132   ALKPHOS 70 05/25/2023 2132   BILITOT 1.0 05/25/2023 2132   BILITOT 0.3 08/31/2022 0954   GFRNONAA >60 05/25/2023 2132   GFRAA >60 10/10/2019 1441       Component Value Date/Time   WBC 11.8 (H) 05/25/2023 2132   RBC 4.51 05/25/2023 2132   HGB 15.1 (H) 05/25/2023 2132   HGB 13.9 08/31/2022 0954   HCT 42.4 05/25/2023 2132   HCT 41.0 08/31/2022 0954   PLT 187 05/25/2023 2132   PLT 249 08/31/2022 0954   MCV 94.0 05/25/2023 2132   MCV 96 08/31/2022 0954   MCH 33.5 05/25/2023 2132   MCHC 35.6 05/25/2023 2132   RDW 12.5 05/25/2023 2132   RDW 12.6 08/31/2022 0954   LYMPHSABS 0.5 (L) 05/25/2023 2132   LYMPHSABS 2.3 08/31/2022 0954   MONOABS 0.5 05/25/2023 2132   EOSABS 0.2 05/25/2023 2132   EOSABS 0.3 08/31/2022 0954   BASOSABS 0.0 05/25/2023 2132   BASOSABS 0.1 08/31/2022 0954    No results found for:  POCLITH, LITHIUM   No results found for: PHENYTOIN, PHENOBARB, VALPROATE, CBMZ   .res Assessment: Plan:    Plan:  PDMP reviewed  Xanax  1mg  BID as needed for anxiety and panic attacks Hydroxyzine  25mg  TID  Continue Prozac  10mg  daily - had some toleration issues up front, but has restarted medication.  Will keep patient out of work for mood stabilization effective 10/25/2023 through 12/09/2023.  Gene sight testing available.  Working with a Paramedic.  RTC 4 weeks  30 minutes spent dedicated to the care of this patient on the date of this encounter to include pre-visit review of records, ordering of medication, post visit documentation, and face-to-face time with the patient discussing MDD, mixed obsessional thoughts, insomnia, panic attacks, and GAD. Discussed continuing current medication regimen.  Patient was advised to contact office with any questions, adverse effects, or acute worsening in signs and symptoms.  Discussed potential benefits, risk, and side effects of benzodiazepines to include potential risk of tolerance and dependence, as well as possible drowsiness. Advised patient not to drive  if experiencing drowsiness and to take lowest possible effective dose to minimize risk of dependence and tolerance.  There are no diagnoses linked to this encounter.   Please see After Visit Summary for patient specific instructions.  Future Appointments  Date Time Provider Department Center  10/27/2023  1:00 PM Vanassa Penniman Nattalie, NP [facility] None  11/08/2023  3:00 PM Prentiss Riggs A, NP [facility] None    No orders of the defined types were placed in this encounter.     -------------------------------

## 2023-10-31 ENCOUNTER — Other Ambulatory Visit: Payer: Self-pay | Admitting: Adult Health

## 2023-10-31 DIAGNOSIS — F331 Major depressive disorder, recurrent, moderate: Secondary | ICD-10-CM

## 2023-11-02 ENCOUNTER — Other Ambulatory Visit: Payer: Self-pay | Admitting: Cardiology

## 2023-11-02 DIAGNOSIS — R002 Palpitations: Secondary | ICD-10-CM

## 2023-11-06 ENCOUNTER — Other Ambulatory Visit: Payer: Self-pay | Admitting: Adult Health

## 2023-11-06 DIAGNOSIS — G4700 Insomnia, unspecified: Secondary | ICD-10-CM

## 2023-11-06 DIAGNOSIS — F410 Panic disorder [episodic paroxysmal anxiety] without agoraphobia: Secondary | ICD-10-CM

## 2023-11-08 ENCOUNTER — Ambulatory Visit: Payer: Self-pay | Admitting: Nurse Practitioner

## 2023-11-08 NOTE — Progress Notes (Deleted)
   Gwendolyn Carrillo [birth year] [id number]   History:  48 y.o. [id number] presents for annual exam. H/O ablation. POPs. Normal pap history. DM, HTN, HLD managed by PCP. GAD, OCD, depression, insomnia managed by [facility].   Gynecologic History No LMP recorded. Patient has had an ablation.   Contraception/Family planning: {method:5051} Sexually active: ***  Health Maintenance Last Pap: ***. Results were:  Last mammogram: ***. Results were:  Last colonoscopy: ***. Results were:  Last Dexa: ***. Results were:      08/31/2022    8:57 AM  Depression screen PHQ 2/9  Decreased Interest 2  Down, Depressed, Hopeless 2  PHQ - 2 Score 4  Altered sleeping 2  Tired, decreased energy 3  Change in appetite 3  Feeling bad or failure about yourself  3  Trouble concentrating 1  Moving slowly or fidgety/restless 0  Suicidal thoughts 1  PHQ-9 Score 17     Past medical history, past surgical history, family history and social history were all reviewed and documented in the EPIC chart.  ROS:  A ROS was performed and pertinent positives and negatives are included.  Exam:  There were no vitals filed for this visit. There is no height or weight on file to calculate BMI.  General appearance:  Normal Thyroid :  Symmetrical, normal in size, without palpable masses or nodularity. Respiratory  Auscultation:  Clear without wheezing or rhonchi Cardiovascular  Auscultation:  Regular rate, without rubs, murmurs or gallops  Edema/varicosities:  Not grossly evident Abdominal  Soft,nontender, without masses, guarding or rebound.  Liver/spleen:  No organomegaly noted  Hernia:  None appreciated  Skin  Inspection:  Grossly normal Breasts: Examined lying and sitting.   Right: Without masses, retractions, nipple discharge or axillary adenopathy.   Left: Without masses, retractions, nipple discharge or axillary adenopathy. Pelvic: External genitalia:  no lesions              Urethra:  normal appearing urethra  with no masses, tenderness or lesions              Bartholins and Skenes: normal                 Vagina: normal appearing vagina with normal color and discharge, no lesions              Cervix: no lesions Bimanual Exam:  Uterus:  no masses or tenderness              Adnexa: no mass, fullness, tenderness              Rectovaginal: Deferred              Anus:  normal, no lesions  Gwendolyn Carrillo, CMA present as chaperone.   Assessment/Plan:  48 y.o. [id number] for annual exam.    No follow-ups on file.   Gwendolyn DELENA Shutter DNP, 2:27 PM 11/08/2023

## 2023-11-18 ENCOUNTER — Telehealth: Payer: Self-pay | Admitting: Adult Health

## 2023-11-18 NOTE — Telephone Encounter (Signed)
 Nat from [facility] wanted to know if we got the paperwork for pt to extend her leave. I havent seen it on the fax. Stated it was sent 11/15/23.   Nat [phone]

## 2023-11-22 NOTE — Telephone Encounter (Signed)
 Pt called today upset because [employer] has not received the paperwork from us .  Pls let her know the status.  Next appt 7/31

## 2023-11-23 NOTE — Telephone Encounter (Signed)
 Received [employer] Disability forms. Given to [facility] 7/30

## 2023-11-24 ENCOUNTER — Encounter: Payer: Self-pay | Admitting: Adult Health

## 2023-11-24 ENCOUNTER — Telehealth: Admitting: Adult Health

## 2023-11-24 DIAGNOSIS — F410 Panic disorder [episodic paroxysmal anxiety] without agoraphobia: Secondary | ICD-10-CM

## 2023-11-24 DIAGNOSIS — F411 Generalized anxiety disorder: Secondary | ICD-10-CM | POA: Diagnosis not present

## 2023-11-24 DIAGNOSIS — F422 Mixed obsessional thoughts and acts: Secondary | ICD-10-CM

## 2023-11-24 DIAGNOSIS — Z0289 Encounter for other administrative examinations: Secondary | ICD-10-CM

## 2023-11-24 DIAGNOSIS — F331 Major depressive disorder, recurrent, moderate: Secondary | ICD-10-CM | POA: Diagnosis not present

## 2023-11-24 DIAGNOSIS — G4700 Insomnia, unspecified: Secondary | ICD-10-CM

## 2023-11-24 NOTE — Progress Notes (Signed)
 Gwendolyn Carrillo [id number] November 03, 1975 48 y.o.  Virtual Visit via Video Note  I connected with pt @ on 11/24/23 at  1:00 PM EDT by a video enabled telemedicine application and verified that I am speaking with the correct person using two identifiers.   I discussed the limitations of evaluation and management by telemedicine and the availability of in person appointments. The patient expressed understanding and agreed to proceed.  I discussed the assessment and treatment plan with the patient. The patient was provided an opportunity to ask questions and all were answered. The patient agreed with the plan and demonstrated an understanding of the instructions.   The patient was advised to call back or seek an in-person evaluation if the symptoms worsen or if the condition fails to improve as anticipated.  I provided 30 minutes of non-face-to-face time during this encounter.  The patient was located at home.  The provider was located at [facility].   Gwendolyn LOISE Sayers, NP   Subjective:   Patient ID:  Gwendolyn Carrillo is a 48 y.o. (DOB Jan 15, 1976) female.  Chief Complaint: No chief complaint on file.   HPI Gwendolyn Carrillo presents for follow-up of MDD, mixed obsessional thoughts, insomnia, panic attacks and GAD.   Describes mood today as about the same. Flat. Reports decreased tearfulness. Mood symptoms - reports feeling very depressed and anxious. Continues to feel like there is a hormonal component to her mood symptoms. Denies irritability. Reports decreased interest and motivation.  Denies recent panic attacks. Reports worry, rumination and over thinking over family and financial. Reports ongoing situational stressors. Reports mood is still lower. Stating I still feel sad. Reports Prozac  has not been helpful for mood symptoms, but is willing to consider other options - Spravato or TMS. Reports seeing a therapist - Gwendolyn Carrillo - [place]. Reports taking Xanax  twice daily and  Hydroxyzine  3 nights a week. Taking medications as prescribed.  Energy levels lower. Active, does not have a regular exercise routine. She is able to enjoy usual interests and activities. Single. Lives alone. Has 4 children. Family local. Spending time with family.  Appetite adequate. Weight gain - 190 pounds.  Reports sleep has improved. Averages 6 to 7 hours with Hydroxzine 50mg  at hs. Reports focus and concentration improved. Completing tasks. Managing some aspects of household. Typically working full time - remotely - [employer].  Denies SI or HI.  Denies AH or VH. Denies self harm. Denies substance use. Denies alcohol use.  Previous medications: Zoloft , Clonazepam   Review of Systems:  Review of Systems  Musculoskeletal:  Negative for gait problem.  Neurological:  Negative for tremors.  Psychiatric/Behavioral:         Please refer to HPI    Medications: I have reviewed the patient's current medications.  Current Outpatient Medications  Medication Sig Dispense Refill   ALPRAZolam  (XANAX ) 1 MG tablet TAKE 1 TABLET BY MOUTH 2 TIMES DAILY AS NEEDED FOR ANXIETY. 60 tablet 0   atorvastatin  (LIPITOR) 80 MG tablet Take 1 tablet (80 mg total) by mouth daily. 90 tablet 1   Blood Glucose Monitoring Suppl (ONETOUCH VERIO) w/Device KIT USE TO CHECK BLOOD SUGAR THREE TIMES DAILY. 1 kit 0   Cholecalciferol (VITAMIN D3) 50 MCG (2000 UT) capsule Take 1 capsule (2,000 Units total) by mouth daily. (Patient not taking: Reported on 02/10/2022) 90 capsule 1   clindamycin  (CLEOCIN ) 2 % vaginal cream Place 1 Applicatorful vaginally at bedtime. 40 g 0   Continuous Blood Gluc Receiver (DEXCOM G6  RECEIVER) DEVI 1 Bag by Does not apply route in the morning, at noon, and at bedtime. 1 each 0   Continuous Glucose Sensor (DEXCOM G6 SENSOR) MISC USE AS DIRECTED EVERY 10 DAYS 9 each 2   Continuous Glucose Transmitter (DEXCOM G6 TRANSMITTER) MISC USE AS DIRECTED OVER 90 DAYS 1 each 2   cyclobenzaprine  (FLEXERIL )  10 MG tablet Take 1 tablet (10 mg total) by mouth 2 (two) times daily as needed for muscle spasms. 12 tablet 0   Elastic Bandages & Supports (ELBOW COMPRESSION) MISC 1 Application by Does not apply route daily. 1 each 0   FLUoxetine  (PROZAC ) 10 MG capsule Take 1 capsule (10 mg total) by mouth daily. 30 capsule 2   glucose blood (ONETOUCH VERIO) test strip USE TO CHECK BLOOD SUGAR THREE TIMES DAILY. 100 each 2   hydrochlorothiazide  (HYDRODIURIL ) 25 MG tablet Take 1 tablet (25 mg total) by mouth daily. 90 tablet 1   hydrOXYzine  (ATARAX ) 25 MG tablet Take 1 tablet (25 mg total) by mouth 3 (three) times daily as needed. 90 tablet 1   Insulin  Glargine (BASAGLAR  KWIKPEN) 100 UNIT/ML Inject 15 Units into the skin daily. 15 mL 0   insulin  lispro (HUMALOG  KWIKPEN) 100 UNIT/ML KwikPen INJECT up to 12u INTO THE SKIN 3 TIMES DAILY WITH MEALS per sliding scale: 200-250: 4 UNITS, 251-300: 6 UNITS, 301-350: 8 UNITS, 351-400: 10 UNITS,>400:12 UNITS AND CALL NP. Max daily dose: 36u 30 mL 0   Insulin  Pen Needle (PEN NEEDLES) 32G X 5 MM MISC Use to inject sliding scale Humalog  three times daily. 100 each 6   JARDIANCE  10 MG TABS tablet Take 10 mg by mouth daily.     lidocaine  (LIDODERM ) 5 % Place 1 patch onto the skin daily. Remove & Discard patch within 12 hours or as directed by MD 30 patch 0   LINZESS  145 MCG CAPS capsule Take 1 capsule (145 mcg total) by mouth daily as needed (constipation). 90 capsule 1   lisinopril  (ZESTRIL ) 20 MG tablet Take 1 tablet (20 mg total) by mouth daily. 90 tablet 1   metFORMIN (GLUCOPHAGE) 500 MG tablet Take 500 mg by mouth daily with breakfast.     metoCLOPramide  (REGLAN ) 10 MG tablet Take 10 mg by mouth every 6 (six) hours as needed.     metoprolol  tartrate (LOPRESSOR ) 25 MG tablet TAKE 1 TABLET BY MOUTH TWICE A DAY 60 tablet 0   norethindrone  (MICRONOR ) 0.35 MG tablet Take 1 tablet (0.35 mg total) by mouth daily. 84 tablet 0   omeprazole  (PRILOSEC) 20 MG capsule TAKE 1 CAPSULE BY  MOUTH DAILY IF NEEDED FOR GASTRIC REFLUX 90 capsule 0   ondansetron  (ZOFRAN -ODT) 4 MG disintegrating tablet TAKE 1 TABLET BY MOUTH EVERY 8 HOURS AS NEEDED FOR NAUSEA AND VOMITING 12 tablet 0   OneTouch Delica Lancets 33G MISC USE TO CHECK BLOOD SUGAR THREE TIMES DAILY. 100 each 2   telmisartan (MICARDIS) 80 MG tablet Take 1 tablet by mouth daily.     No current facility-administered medications for this visit.    Medication Side Effects: None  Allergies: Not on File  Past Medical History:  Diagnosis Date   Anxiety    Diabetes mellitus without complication (HCC)    Gastroparesis    Hypertension    Major depressive disorder     Family History  Problem Relation Age of Onset   Hyperlipidemia Mother    Hypertension Mother    Hyperlipidemia Father    Hypertension Father  Diabetes Father    Diabetes Sister    Blindness Sister    Hypertension Brother    Diabetes Brother    Stroke Brother    Diabetes Maternal Aunt    Diabetes Maternal Grandmother    Hypertension Maternal Grandmother    Heart disease Maternal Grandmother    Diabetes Paternal Grandmother    Hypertension Paternal Grandmother    Heart disease Paternal Grandmother     Social History   Socioeconomic History   Marital status: Single    Spouse name: Not on file   Number of children: Not on file   Years of education: Not on file   Highest education level: Not on file  Occupational History   Not on file  Tobacco Use   Smoking status: Some Days    Current packs/day: 0.25    Types: Cigarettes   Smokeless tobacco: Never   Tobacco comments:    I smoke when I'm drinking  Vaping Use   Vaping status: Never Used  Substance and Sexual Activity   Alcohol use: Yes    Comment: occ   Drug use: Not Currently    Types: Marijuana   Sexual activity: Not Currently    Partners: Male    Comment: First IC <16, Partners >5, Hx of CT+, GC+  Other Topics Concern   Not on file  Social History Narrative   Not on file    Social Drivers of Health   Financial Resource Strain: Not on file  Food Insecurity: Low Risk  (08/05/2023)   Received from [facility]   Hunger Vital Sign    Within the past 12 months, you worried that your food would run out before you got money to buy more: Never true    Within the past 12 months, the food you bought just didn't last and you didn't have money to get more. : Never true  Transportation Needs: No Transportation Needs (08/05/2023)   Received from [facility]    In the past 12 months, has lack of reliable transportation kept you from medical appointments, meetings, work or from getting things needed for daily living? : No  Physical Activity: Not on file  Stress: Not on file  Social Connections: Unknown (08/25/2021)   Received from [facility]   Social Network    Social Network: Not on file  Intimate Partner Violence: Unknown (07/30/2021)   Received from [facility]   HITS    Physically Hurt: Not on file    Insult or Talk Down To: Not on file    Threaten Physical Harm: Not on file    Scream or Curse: Not on file    Past Medical History, Surgical history, Social history, and Family history were reviewed and updated as appropriate.   Please see review of systems for further details on the patient's review from today.   Objective:   Physical Exam:  There were no vitals taken for this visit.  Physical Exam Constitutional:      General: She is not in acute distress. Musculoskeletal:        General: No deformity.  Neurological:     Mental Status: She is alert and oriented to person, place, and time.     Coordination: Coordination normal.  Psychiatric:        Attention and Perception: Attention and perception normal. She does not perceive auditory or visual hallucinations.        Mood and Affect: Mood is anxious and depressed. Affect is  flat. Affect is not labile, blunt, angry or inappropriate.        Speech: Speech normal.        Behavior:  Behavior normal.        Thought Content: Thought content normal. Thought content is not paranoid or delusional. Thought content does not include homicidal or suicidal ideation. Thought content does not include homicidal or suicidal plan.        Cognition and Memory: Cognition and memory normal.        Judgment: Judgment normal.     Comments: Insight intact     Lab Review:     Component Value Date/Time   NA 131 (L) 05/25/2023 2132   NA 141 08/31/2022 0954   K 3.5 05/25/2023 2132   CL 94 (L) 05/25/2023 2132   CO2 25 05/25/2023 2132   GLUCOSE 314 (H) 05/25/2023 2132   BUN 10 05/25/2023 2132   BUN 12 08/31/2022 0954   CREATININE 0.87 05/25/2023 2132   CALCIUM  9.5 05/25/2023 2132   PROT 8.4 (H) 05/25/2023 2132   PROT 7.6 08/31/2022 0954   ALBUMIN 4.4 05/25/2023 2132   ALBUMIN 4.3 08/31/2022 0954   AST 71 (H) 05/25/2023 2132   ALT 86 (H) 05/25/2023 2132   ALKPHOS 70 05/25/2023 2132   BILITOT 1.0 05/25/2023 2132   BILITOT 0.3 08/31/2022 0954   GFRNONAA >60 05/25/2023 2132   GFRAA >60 10/10/2019 1441       Component Value Date/Time   WBC 11.8 (H) 05/25/2023 2132   RBC 4.51 05/25/2023 2132   HGB 15.1 (H) 05/25/2023 2132   HGB 13.9 08/31/2022 0954   HCT 42.4 05/25/2023 2132   HCT 41.0 08/31/2022 0954   PLT 187 05/25/2023 2132   PLT 249 08/31/2022 0954   MCV 94.0 05/25/2023 2132   MCV 96 08/31/2022 0954   MCH 33.5 05/25/2023 2132   MCHC 35.6 05/25/2023 2132   RDW 12.5 05/25/2023 2132   RDW 12.6 08/31/2022 0954   LYMPHSABS 0.5 (L) 05/25/2023 2132   LYMPHSABS 2.3 08/31/2022 0954   MONOABS 0.5 05/25/2023 2132   EOSABS 0.2 05/25/2023 2132   EOSABS 0.3 08/31/2022 0954   BASOSABS 0.0 05/25/2023 2132   BASOSABS 0.1 08/31/2022 0954    No results found for: POCLITH, LITHIUM   No results found for: PHENYTOIN, PHENOBARB, VALPROATE, CBMZ   .res Assessment: Plan:   Plan:  PDMP reviewed  Xanax  1mg  BID as needed for anxiety and panic attacks Hydroxyzine  25mg   TID  Continue Prozac  10mg  daily - had some toleration issues up front, but has restarted medication.  Will keep patient out of work for mood stabilization effective 10/25/2023 through 12/09/2023. Will discuss a RTW date at next visit on 12/09/2023.  Gene sight testing available.  Working with a Paramedic.  RTC 4 weeks  30 minutes spent dedicated to the care of this patient on the date of this encounter to include pre-visit review of records, ordering of medication, post visit documentation, and face-to-face time with the patient discussing MDD, mixed obsessional thoughts, insomnia, panic attacks, and GAD. Discussed continuing current medication regimen.  Patient was advised to contact office with any questions, adverse effects, or acute worsening in signs and symptoms.  Discussed potential benefits, risk, and side effects of benzodiazepines to include potential risk of tolerance and dependence, as well as possible drowsiness. Advised patient not to drive if experiencing drowsiness and to take lowest possible effective dose to minimize risk of dependence and tolerance.  There are no diagnoses linked  to this encounter.   Please see After Visit Summary for patient specific instructions.  No future appointments.  No orders of the defined types were placed in this encounter.     -------------------------------

## 2023-11-27 NOTE — Telephone Encounter (Signed)
 Paperwork completed, signed, and faxed

## 2023-12-09 ENCOUNTER — Telehealth (INDEPENDENT_AMBULATORY_CARE_PROVIDER_SITE_OTHER): Admitting: Adult Health

## 2023-12-09 ENCOUNTER — Encounter: Payer: Self-pay | Admitting: Adult Health

## 2023-12-09 DIAGNOSIS — F331 Major depressive disorder, recurrent, moderate: Secondary | ICD-10-CM | POA: Diagnosis not present

## 2023-12-09 DIAGNOSIS — G4700 Insomnia, unspecified: Secondary | ICD-10-CM | POA: Diagnosis not present

## 2023-12-09 DIAGNOSIS — F422 Mixed obsessional thoughts and acts: Secondary | ICD-10-CM

## 2023-12-09 DIAGNOSIS — F410 Panic disorder [episodic paroxysmal anxiety] without agoraphobia: Secondary | ICD-10-CM

## 2023-12-09 DIAGNOSIS — F411 Generalized anxiety disorder: Secondary | ICD-10-CM | POA: Diagnosis not present

## 2023-12-09 MED ORDER — ALPRAZOLAM 1 MG PO TABS: 1.0000 mg | ORAL_TABLET | Freq: Two times a day (BID) | ORAL | 0 refills | Status: DC | PRN

## 2023-12-09 NOTE — Progress Notes (Signed)
 Gwendolyn Carrillo [id number] 1975/10/04 48 y.o.  Virtual Visit via Video Note  I connected with pt @ on 12/09/23 at 11:30 AM EDT by a video enabled telemedicine application and verified that I am speaking with the correct person using two identifiers.   I discussed the limitations of evaluation and management by telemedicine and the availability of in person appointments. The patient expressed understanding and agreed to proceed.  I discussed the assessment and treatment plan with the patient. The patient was provided an opportunity to ask questions and all were answered. The patient agreed with the plan and demonstrated an understanding of the instructions.   The patient was advised to call back or seek an in-person evaluation if the symptoms worsen or if the condition fails to improve as anticipated.  I provided 30 minutes of non-face-to-face time during this encounter.  The patient was located at home.  The provider was located at [facility].   Angeline LOISE Sayers, NP   Subjective:   Patient ID:  Gwendolyn Carrillo is a 48 y.o. (DOB [birth year]) female.  Chief Complaint: No chief complaint on file.   HPI [name] presents for follow-up of MDD, mixed obsessional thoughts, insomnia, panic attacks and GAD.   Describes mood today as still about the same. Flat. Reports tearfulness.  Mood symptoms - reports feeling depressed and anxious. Denies irritability. Reports decreased interest and motivation. Denies recent panic attacks - reports she feels them coming on and is using deep breathing techniques. Reports decreased worry, rumination and over thinking able to pay my bills. Reports recent family and financial stressors. Reports hormonal fluctuations effecting mood. Reports ongoing situational stressors. Reports mood remains lower. Stating I feel like I'm doing a little better. Patient is willing to consider other treatment options - Spravato or TMS. Reports seeing a  therapist - [facility] [place]. Taking medications as prescribed.  Energy levels lower. Active, does not have a regular exercise routine. She is able to enjoy usual interests and activities. Single. Lives alone. Has 4 children. Family local. Spending time with family.  Appetite decreased. Weight fluctuates - 190 pounds.  Reports sleep has improved. Averages 6 to 7 hours and using Hydroxyzine  as needed. Reports focus and concentration improved - getting better, but still has issues. Completing tasks - self care a little better. Managing some aspects of household. Typically working full time - remotely - [employer].  Denies SI or HI.  Denies AH or VH. Denies self harm. Denies substance use. Denies alcohol use.  Previous medications: Zoloft , Clonazepam     Review of Systems:  Review of Systems  Musculoskeletal:  Negative for gait problem.  Neurological:  Negative for tremors.  Psychiatric/Behavioral:         Please refer to HPI    Medications: I have reviewed the patient's current medications.  Current Outpatient Medications  Medication Sig Dispense Refill   ALPRAZolam  (XANAX ) 1 MG tablet Take 1 tablet (1 mg total) by mouth 2 (two) times daily as needed for anxiety. 60 tablet 0   atorvastatin  (LIPITOR) 80 MG tablet Take 1 tablet (80 mg total) by mouth daily. 90 tablet 1   Blood Glucose Monitoring Suppl (ONETOUCH VERIO) w/Device KIT USE TO CHECK BLOOD SUGAR THREE TIMES DAILY. 1 kit 0   Cholecalciferol (VITAMIN D3) 50 MCG (2000 UT) capsule Take 1 capsule (2,000 Units total) by mouth daily. (Patient not taking: Reported on 02/10/2022) 90 capsule 1   clindamycin  (CLEOCIN ) 2 % vaginal cream Place 1 Applicatorful  vaginally at bedtime. 40 g 0   Continuous Blood Gluc Receiver (DEXCOM G6 RECEIVER) DEVI 1 Bag by Does not apply route in the morning, at noon, and at bedtime. 1 each 0   Continuous Glucose Sensor (DEXCOM G6 SENSOR) MISC USE AS DIRECTED EVERY 10 DAYS 9 each 2   Continuous Glucose  Transmitter (DEXCOM G6 TRANSMITTER) MISC USE AS DIRECTED OVER 90 DAYS 1 each 2   cyclobenzaprine  (FLEXERIL ) 10 MG tablet Take 1 tablet (10 mg total) by mouth 2 (two) times daily as needed for muscle spasms. 12 tablet 0   Elastic Bandages & Supports (ELBOW COMPRESSION) MISC 1 Application by Does not apply route daily. 1 each 0   FLUoxetine  (PROZAC ) 10 MG capsule Take 1 capsule (10 mg total) by mouth daily. 30 capsule 2   glucose blood (ONETOUCH VERIO) test strip USE TO CHECK BLOOD SUGAR THREE TIMES DAILY. 100 each 2   hydrochlorothiazide  (HYDRODIURIL ) 25 MG tablet Take 1 tablet (25 mg total) by mouth daily. 90 tablet 1   hydrOXYzine  (ATARAX ) 25 MG tablet Take 1 tablet (25 mg total) by mouth 3 (three) times daily as needed. 90 tablet 1   Insulin  Glargine (BASAGLAR  KWIKPEN) 100 UNIT/ML Inject 15 Units into the skin daily. 15 mL 0   insulin  lispro (HUMALOG  KWIKPEN) 100 UNIT/ML KwikPen INJECT up to 12u INTO THE SKIN 3 TIMES DAILY WITH MEALS per sliding scale: 200-250: 4 UNITS, 251-300: 6 UNITS, 301-350: 8 UNITS, 351-400: 10 UNITS,>400:12 UNITS AND CALL NP. Max daily dose: 36u 30 mL 0   Insulin  Pen Needle (PEN NEEDLES) 32G X 5 MM MISC Use to inject sliding scale Humalog  three times daily. 100 each 6   JARDIANCE  10 MG TABS tablet Take 10 mg by mouth daily.     lidocaine  (LIDODERM ) 5 % Place 1 patch onto the skin daily. Remove & Discard patch within 12 hours or as directed by MD 30 patch 0   LINZESS  145 MCG CAPS capsule Take 1 capsule (145 mcg total) by mouth daily as needed (constipation). 90 capsule 1   lisinopril  (ZESTRIL ) 20 MG tablet Take 1 tablet (20 mg total) by mouth daily. 90 tablet 1   metFORMIN (GLUCOPHAGE) 500 MG tablet Take 500 mg by mouth daily with breakfast.     metoCLOPramide  (REGLAN ) 10 MG tablet Take 10 mg by mouth every 6 (six) hours as needed.     metoprolol  tartrate (LOPRESSOR ) 25 MG tablet TAKE 1 TABLET BY MOUTH TWICE A DAY 60 tablet 0   norethindrone  (MICRONOR ) 0.35 MG tablet Take 1  tablet (0.35 mg total) by mouth daily. 84 tablet 0   omeprazole  (PRILOSEC) 20 MG capsule TAKE 1 CAPSULE BY MOUTH DAILY IF NEEDED FOR GASTRIC REFLUX 90 capsule 0   ondansetron  (ZOFRAN -ODT) 4 MG disintegrating tablet TAKE 1 TABLET BY MOUTH EVERY 8 HOURS AS NEEDED FOR NAUSEA AND VOMITING 12 tablet 0   OneTouch Delica Lancets 33G MISC USE TO CHECK BLOOD SUGAR THREE TIMES DAILY. 100 each 2   telmisartan (MICARDIS) 80 MG tablet Take 1 tablet by mouth daily.     No current facility-administered medications for this visit.    Medication Side Effects: None  Allergies: Not on File  Past Medical History:  Diagnosis Date   Anxiety    Diabetes mellitus without complication (HCC)    Gastroparesis    Hypertension    Major depressive disorder     Family History  Problem Relation Age of Onset   Hyperlipidemia Mother    Hypertension  Mother    Hyperlipidemia Father    Hypertension Father    Diabetes Father    Diabetes Sister    Blindness Sister    Hypertension Brother    Diabetes Brother    Stroke Brother    Diabetes Maternal Aunt    Diabetes Maternal Grandmother    Hypertension Maternal Grandmother    Heart disease Maternal Grandmother    Diabetes Paternal Grandmother    Hypertension Paternal Grandmother    Heart disease Paternal Grandmother     Social History   Socioeconomic History   Marital status: Single    Spouse name: Not on file   Number of children: Not on file   Years of education: Not on file   Highest education level: Not on file  Occupational History   Not on file  Tobacco Use   Smoking status: Some Days    Current packs/day: 0.25    Types: Cigarettes   Smokeless tobacco: Never   Tobacco comments:    I smoke when I'm drinking  Vaping Use   Vaping status: Never Used  Substance and Sexual Activity   Alcohol use: Yes    Comment: occ   Drug use: Not Currently    Types: Marijuana   Sexual activity: Not Currently    Partners: Male    Comment: First IC <16,  Partners >5, Hx of CT+, GC+  Other Topics Concern   Not on file  Social History Narrative   Not on file   Social Drivers of Health   Financial Resource Strain: Not on file  Food Insecurity: Low Risk  (08/05/2023)   Received from [facility]   Hunger Vital Sign    Within the past 12 months, you worried that your food would run out before you got money to buy more: Never true    Within the past 12 months, the food you bought just didn't last and you didn't have money to get more. : Never true  Transportation Needs: No Transportation Needs (08/05/2023)   Received from [facility]    In the past 12 months, has lack of reliable transportation kept you from medical appointments, meetings, work or from getting things needed for daily living? : No  Physical Activity: Not on file  Stress: Not on file  Social Connections: Unknown (08/25/2021)   Received from [facility]   Social Network    Social Network: Not on file  Intimate Partner Violence: Unknown (07/30/2021)   Received from [facility]   HITS    Physically Hurt: Not on file    Insult or Talk Down To: Not on file    Threaten Physical Harm: Not on file    Scream or Curse: Not on file    Past Medical History, Surgical history, Social history, and Family history were reviewed and updated as appropriate.   Please see review of systems for further details on the patient's review from today.   Objective:   Physical Exam:  There were no vitals taken for this visit.  Physical Exam Constitutional:      General: She is not in acute distress. Musculoskeletal:        General: No deformity.  Neurological:     Mental Status: She is alert and oriented to person, place, and time.     Coordination: Coordination normal.  Psychiatric:        Attention and Perception: Attention and perception normal. She does not perceive auditory or visual hallucinations.  Mood and Affect: Mood is anxious and depressed. Affect is  flat and tearful. Affect is not labile, blunt, angry or inappropriate.        Speech: Speech normal.        Behavior: Behavior normal.        Thought Content: Thought content normal. Thought content is not paranoid or delusional. Thought content does not include homicidal or suicidal ideation. Thought content does not include homicidal or suicidal plan.        Cognition and Memory: Cognition and memory normal.        Judgment: Judgment normal.     Comments: Insight intact     Lab Review:     Component Value Date/Time   NA 131 (L) 05/25/2023 2132   NA 141 08/31/2022 0954   K 3.5 05/25/2023 2132   CL 94 (L) 05/25/2023 2132   CO2 25 05/25/2023 2132   GLUCOSE 314 (H) 05/25/2023 2132   BUN 10 05/25/2023 2132   BUN 12 08/31/2022 0954   CREATININE 0.87 05/25/2023 2132   CALCIUM  9.5 05/25/2023 2132   PROT 8.4 (H) 05/25/2023 2132   PROT 7.6 08/31/2022 0954   ALBUMIN 4.4 05/25/2023 2132   ALBUMIN 4.3 08/31/2022 0954   AST 71 (H) 05/25/2023 2132   ALT 86 (H) 05/25/2023 2132   ALKPHOS 70 05/25/2023 2132   BILITOT 1.0 05/25/2023 2132   BILITOT 0.3 08/31/2022 0954   GFRNONAA >60 05/25/2023 2132   GFRAA >60 10/10/2019 1441       Component Value Date/Time   WBC 11.8 (H) 05/25/2023 2132   RBC 4.51 05/25/2023 2132   HGB 15.1 (H) 05/25/2023 2132   HGB 13.9 08/31/2022 0954   HCT 42.4 05/25/2023 2132   HCT 41.0 08/31/2022 0954   PLT 187 05/25/2023 2132   PLT 249 08/31/2022 0954   MCV 94.0 05/25/2023 2132   MCV 96 08/31/2022 0954   MCH 33.5 05/25/2023 2132   MCHC 35.6 05/25/2023 2132   RDW 12.5 05/25/2023 2132   RDW 12.6 08/31/2022 0954   LYMPHSABS 0.5 (L) 05/25/2023 2132   LYMPHSABS 2.3 08/31/2022 0954   MONOABS 0.5 05/25/2023 2132   EOSABS 0.2 05/25/2023 2132   EOSABS 0.3 08/31/2022 0954   BASOSABS 0.0 05/25/2023 2132   BASOSABS 0.1 08/31/2022 0954    No results found for: POCLITH, LITHIUM   No results found for: PHENYTOIN, PHENOBARB, VALPROATE, CBMZ    .res Assessment: Plan:    Plan:  PDMP reviewed  Xanax  1mg  BID as needed for anxiety and panic attacks Hydroxyzine  25mg  TID as needed Prozac  10mg  daily   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  Will keep patient out of work for mood stabilization effective 11/02/2023 through 01/05/2024. Will meet with patient on 01/05/2024 before returning to work setting. Patient will plan to return to work on 01/06/2024.  Gene sight testing available.  Working with a Paramedic.  RTC 4 weeks  30 minutes spent dedicated to the care of this patient on the date of this encounter to include pre-visit review of records, ordering of medication, post visit documentation, and face-to-face time with the patient discussing MDD, mixed obsessional thoughts, insomnia, panic attacks, and GAD. Discussed continuing current medication regimen.  Patient was advised to contact office with any questions, adverse effects, or acute worsening in signs and symptoms.  Discussed potential benefits, risk, and side effects of benzodiazepines to include potential risk of tolerance and dependence, as well as possible drowsiness. Advised patient not to drive if  experiencing drowsiness and to take lowest possible effective dose to minimize risk of dependence and tolerance. Diagnoses and all orders for this visit:  Major depressive disorder, recurrent episode, moderate (HCC)  Panic attacks -     ALPRAZolam  (XANAX ) 1 MG tablet; Take 1 tablet (1 mg total) by mouth 2 (two) times daily as needed for anxiety.  Insomnia, unspecified type -     ALPRAZolam  (XANAX ) 1 MG tablet; Take 1 tablet (1 mg total) by mouth 2 (two) times daily as needed for anxiety.  Generalized anxiety disorder  Mixed obsessional thoughts and acts     Please see After Visit Summary for patient specific instructions.  No future appointments.   No orders of the defined types were placed in this encounter.      -------------------------------

## 2023-12-19 ENCOUNTER — Ambulatory Visit: Attending: Cardiology | Admitting: Cardiology

## 2023-12-19 NOTE — Progress Notes (Deleted)
  Cardiology Office Note:  .   Date:  12/19/2023  ID:  Gwendolyn Carrillo, DOB [birth year], MRN [id number] PCP: Celestia Rosaline SQUIBB, NP  [facility] Providers Cardiologist:  Newman Lawrence, MD PCP: Celestia Rosaline SQUIBB, NP  No chief complaint on file.    Gwendolyn Carrillo is a 48 y.o. female with  hypertension, uncontrolled insulin -dependent diabetes mellitus, prior h/o alcohol use, depression, generalized anxiety disorder, panic attacks, palpitations   Discussed the use of AI scribe software for clinical note transcription with the patient, who gave verbal consent to proceed.  History of Present Illness       There were no vitals filed for this visit.    ROS      Studies Reviewed: .        ***  Echocardiogram 05/2021:    Normal LV systolic function with EF 65%. Left ventricle cavity is normal  in size. Normal left ventricular wall thickness. Normal global wall  motion. Normal diastolic filling pattern. Calculated EF 65%.  Structurally normal mitral valve.  Mild (Grade I) mitral regurgitation.  Structurally normal tricuspid valve with trace regurgitation. Unable to  calculate RV systolic pressure due to inadequate TR jet.   Mobile cardiac telemetry 13 days 06/08/2021 - 06/21/2021: Dominant rhythm: Sinus. HR 61-143 bpm. Avg HR 94 bpm. 0 episodes of SVT. <1% isolated SVE, couplet/triplets. 0 episodes of VT. <1% isolated VE, couplets. No atrial fibrillation/atrial flutter/SVT/VT/high grade AV block, sinus pause >3sec noted. 6 patient triggered events, correlated with SVE/VE.    Labs ***/202***: Chol ***, TG ***, HDL ***, LDL *** HbA1C ***% Hb *** Cr *** ***  Risk Assessment/Calculations:   {Does this patient have ATRIAL FIBRILLATION?:[phone]}    Physical Exam   VISIT DIAGNOSES: No diagnosis found.   Gwendolyn Carrillo is a 48 y.o. female with  hypertension, uncontrolled insulin -dependent diabetes mellitus, prior h/o alcohol use,  depression, generalized anxiety disorder, panic attacks, palpitations Assessment & Plan       {Are you ordering a CV Procedure (e.g. stress test, cath, DCCV, TEE, etc)?   Press F2        :[id number]}    No orders of the defined types were placed in this encounter.    F/u in ***  Signed, Newman JINNY Lawrence, MD

## 2023-12-23 ENCOUNTER — Ambulatory Visit: Admitting: Cardiology

## 2023-12-23 ENCOUNTER — Ambulatory Visit: Attending: Cardiology | Admitting: Cardiology

## 2023-12-23 NOTE — Progress Notes (Deleted)
  Cardiology Office Note:  .   Date:  12/23/2023  ID:  [name], DOB March 06, 1976, MRN [id number] PCP: Celestia Rosaline SQUIBB, NP  [facility] Providers Cardiologist:  Newman Lawrence, MD PCP: Celestia Rosaline SQUIBB, NP  No chief complaint on file.    Gwendolyn Carrillo is a 48 y.o. female with  hypertension, uncontrolled insulin -dependent diabetes mellitus, prior h/o alcohol use, depression, generalized anxiety disorder, panic attacks, palpitations   Discussed the use of AI scribe software for clinical note transcription with the patient, who gave verbal consent to proceed.  History of Present Illness       There were no vitals filed for this visit.    ROS      Studies Reviewed: .        ***  Echocardiogram 05/2021:    Normal LV systolic function with EF 65%. Left ventricle cavity is normal  in size. Normal left ventricular wall thickness. Normal global wall  motion. Normal diastolic filling pattern. Calculated EF 65%.  Structurally normal mitral valve.  Mild (Grade I) mitral regurgitation.  Structurally normal tricuspid valve with trace regurgitation. Unable to  calculate RV systolic pressure due to inadequate TR jet.   Mobile cardiac telemetry 13 days 06/08/2021 - 06/21/2021: Dominant rhythm: Sinus. HR 61-143 bpm. Avg HR 94 bpm. 0 episodes of SVT. <1% isolated SVE, couplet/triplets. 0 episodes of VT. <1% isolated VE, couplets. No atrial fibrillation/atrial flutter/SVT/VT/high grade AV block, sinus pause >3sec noted. 6 patient triggered events, correlated with SVE/VE.    Labs 04/2023: HbA1C 8.9% Hb 15.1 Cr 0.87, Glu 314, Na 131. AST/ALT 71/86, total protein 8.4  08/2022: Chol 249, TG 197, HDL 32, LDL 180  Risk Assessment/Calculations:   {Does this patient have ATRIAL FIBRILLATION?[phone]}    Physical Exam   VISIT DIAGNOSES: No diagnosis found.   Gwendolyn Carrillo is a 48 y.o. female with  hypertension, uncontrolled  insulin -dependent diabetes mellitus, prior h/o alcohol use, depression, generalized anxiety disorder, panic attacks, palpitations Assessment & Plan       {Are you ordering a CV Procedure (e.g. stress test, cath, DCCV, TEE, etc)?   Press F2        :[id number]}    No orders of the defined types were placed in this encounter.    F/u in ***  Signed, Newman JINNY Lawrence, MD

## 2024-01-05 ENCOUNTER — Other Ambulatory Visit: Payer: Self-pay | Admitting: Adult Health

## 2024-01-05 ENCOUNTER — Telehealth: Admitting: Adult Health

## 2024-01-05 ENCOUNTER — Encounter: Payer: Self-pay | Admitting: Adult Health

## 2024-01-05 DIAGNOSIS — F410 Panic disorder [episodic paroxysmal anxiety] without agoraphobia: Secondary | ICD-10-CM

## 2024-01-05 DIAGNOSIS — G4700 Insomnia, unspecified: Secondary | ICD-10-CM

## 2024-01-05 DIAGNOSIS — F331 Major depressive disorder, recurrent, moderate: Secondary | ICD-10-CM

## 2024-01-05 DIAGNOSIS — F411 Generalized anxiety disorder: Secondary | ICD-10-CM

## 2024-01-05 DIAGNOSIS — F422 Mixed obsessional thoughts and acts: Secondary | ICD-10-CM

## 2024-01-05 MED ORDER — ALPRAZOLAM 1 MG PO TABS: 1.0000 mg | ORAL_TABLET | Freq: Two times a day (BID) | ORAL | 0 refills | Status: DC | PRN

## 2024-01-05 NOTE — Progress Notes (Addendum)
 Gwendolyn Carrillo [id number] 05/25/1975 48 y.o.  Virtual Visit via Video Note  I connected with pt @ on 01/05/24 at  1:00 PM EDT by a video enabled telemedicine application and verified that I am speaking with the correct person using two identifiers.   I discussed the limitations of evaluation and management by telemedicine and the availability of in person appointments. The patient expressed understanding and agreed to proceed.  I discussed the assessment and treatment plan with the patient. The patient was provided an opportunity to ask questions and all were answered. The patient agreed with the plan and demonstrated an understanding of the instructions.   The patient was advised to call back or seek an in-person evaluation if the symptoms worsen or if the condition fails to improve as anticipated.  I provided 25 minutes of non-face-to-face time during this encounter.  The patient was located at home.  The provider was located at [facility].   Angeline LOISE Sayers, NP   Subjective:   Patient ID:  Gwendolyn Carrillo is a 48 y.o. (DOB 06-Apr-1976) female.  Chief Complaint: No chief complaint on file.   HPI Gwendolyn Carrillo presents for follow-up of MDD, mixed obsessional thoughts, insomnia, panic attacks and GAD.   Describes mood today as sad and lonely. Flat. Reports tearfulness.  Mood symptoms - reports feeling depressed and anxious - it's always there. Denies irritability. Reports decreased interest and motivation. Reports recent panic attacks - I can feel them coming on in my sleep.  Reports increased worry, rumination and over thinking. Reports recent family and financial stressors. Reports her hormonal fluctuations are effecting mood. Reports ongoing situational stressors. Reports mood remains lower. Stating I feel like I'm alive, just existing, not living. Patient plans to return to work on 01/06/2024. Patient is willing to consider other treatment options -  Spravato or TMS. Reports seeing a therapist - Verne[facility] [place]. Taking medications as prescribed.  Energy levels lower. Active, does not have a regular exercise routine. She is unable to enjoy usual interests and activities - I'm working on it. Single. Lives alone. Has 4 children. Family local. Spending time with family.  Appetite decreased. Weight gain - 190's pounds.  Reports sleep has been difficult. Averages 6 to 7 hours 3 out of the past 10 days. Reports last night was a good night. Reports she struggles with hormonal fluctuations 10 days of the month which she feels effects her mood and sleep. Reports focus and concentration improved - today is the first day it's ok. Completing tasks - reports improved self care. Managing some aspects of household. Typically working full time - remotely - [employer].  Denies SI or HI.  Denies AH or VH. Denies self harm. Denies substance use. Denies alcohol use.  Previous medications: Zoloft , Clonazepam   Review of Systems:  Review of Systems  Musculoskeletal:  Negative for gait problem.  Neurological:  Negative for tremors.  Psychiatric/Behavioral:         Please refer to HPI    Medications: I have reviewed the patient's current medications.  Current Outpatient Medications  Medication Sig Dispense Refill   ALPRAZolam  (XANAX ) 1 MG tablet Take 1 tablet (1 mg total) by mouth 2 (two) times daily as needed for anxiety. 60 tablet 0   atorvastatin  (LIPITOR) 80 MG tablet Take 1 tablet (80 mg total) by mouth daily. 90 tablet 1   Blood Glucose Monitoring Suppl (ONETOUCH VERIO) w/Device KIT USE TO CHECK BLOOD SUGAR THREE TIMES DAILY. 1 kit  0   Cholecalciferol (VITAMIN D3) 50 MCG (2000 UT) capsule Take 1 capsule (2,000 Units total) by mouth daily. (Patient not taking: Reported on 02/10/2022) 90 capsule 1   clindamycin  (CLEOCIN ) 2 % vaginal cream Place 1 Applicatorful vaginally at bedtime. 40 g 0   Continuous Blood Gluc Receiver (DEXCOM G6 RECEIVER) DEVI 1  Bag by Does not apply route in the morning, at noon, and at bedtime. 1 each 0   Continuous Glucose Sensor (DEXCOM G6 SENSOR) MISC USE AS DIRECTED EVERY 10 DAYS 9 each 2   Continuous Glucose Transmitter (DEXCOM G6 TRANSMITTER) MISC USE AS DIRECTED OVER 90 DAYS 1 each 2   cyclobenzaprine  (FLEXERIL ) 10 MG tablet Take 1 tablet (10 mg total) by mouth 2 (two) times daily as needed for muscle spasms. 12 tablet 0   Elastic Bandages & Supports (ELBOW COMPRESSION) MISC 1 Application by Does not apply route daily. 1 each 0   FLUoxetine  (PROZAC ) 10 MG capsule Take 1 capsule (10 mg total) by mouth daily. 30 capsule 2   glucose blood (ONETOUCH VERIO) test strip USE TO CHECK BLOOD SUGAR THREE TIMES DAILY. 100 each 2   hydrochlorothiazide  (HYDRODIURIL ) 25 MG tablet Take 1 tablet (25 mg total) by mouth daily. 90 tablet 1   hydrOXYzine  (ATARAX ) 25 MG tablet Take 1 tablet (25 mg total) by mouth 3 (three) times daily as needed. 90 tablet 1   Insulin  Glargine (BASAGLAR  KWIKPEN) 100 UNIT/ML Inject 15 Units into the skin daily. 15 mL 0   insulin  lispro (HUMALOG  KWIKPEN) 100 UNIT/ML KwikPen INJECT up to 12u INTO THE SKIN 3 TIMES DAILY WITH MEALS per sliding scale: 200-250: 4 UNITS, 251-300: 6 UNITS, 301-350: 8 UNITS, 351-400: 10 UNITS,>400:12 UNITS AND CALL NP. Max daily dose: 36u 30 mL 0   Insulin  Pen Needle (PEN NEEDLES) 32G X 5 MM MISC Use to inject sliding scale Humalog  three times daily. 100 each 6   JARDIANCE  10 MG TABS tablet Take 10 mg by mouth daily.     lidocaine  (LIDODERM ) 5 % Place 1 patch onto the skin daily. Remove & Discard patch within 12 hours or as directed by MD 30 patch 0   LINZESS  145 MCG CAPS capsule Take 1 capsule (145 mcg total) by mouth daily as needed (constipation). 90 capsule 1   lisinopril  (ZESTRIL ) 20 MG tablet Take 1 tablet (20 mg total) by mouth daily. 90 tablet 1   metFORMIN (GLUCOPHAGE) 500 MG tablet Take 500 mg by mouth daily with breakfast.     metoCLOPramide  (REGLAN ) 10 MG tablet Take  10 mg by mouth every 6 (six) hours as needed.     metoprolol  tartrate (LOPRESSOR ) 25 MG tablet TAKE 1 TABLET BY MOUTH TWICE A DAY 60 tablet 0   norethindrone  (MICRONOR ) 0.35 MG tablet Take 1 tablet (0.35 mg total) by mouth daily. 84 tablet 0   omeprazole  (PRILOSEC) 20 MG capsule TAKE 1 CAPSULE BY MOUTH DAILY IF NEEDED FOR GASTRIC REFLUX 90 capsule 0   ondansetron  (ZOFRAN -ODT) 4 MG disintegrating tablet TAKE 1 TABLET BY MOUTH EVERY 8 HOURS AS NEEDED FOR NAUSEA AND VOMITING 12 tablet 0   OneTouch Delica Lancets 33G MISC USE TO CHECK BLOOD SUGAR THREE TIMES DAILY. 100 each 2   telmisartan (MICARDIS) 80 MG tablet Take 1 tablet by mouth daily.     No current facility-administered medications for this visit.    Medication Side Effects: None  Allergies: Not on File  Past Medical History:  Diagnosis Date   Anxiety  Diabetes mellitus without complication (HCC)    Gastroparesis    Hypertension    Major depressive disorder     Family History  Problem Relation Age of Onset   Hyperlipidemia Mother    Hypertension Mother    Hyperlipidemia Father    Hypertension Father    Diabetes Father    Diabetes Sister    Blindness Sister    Hypertension Brother    Diabetes Brother    Stroke Brother    Diabetes Maternal Aunt    Diabetes Maternal Grandmother    Hypertension Maternal Grandmother    Heart disease Maternal Grandmother    Diabetes Paternal Grandmother    Hypertension Paternal Grandmother    Heart disease Paternal Grandmother     Social History   Socioeconomic History   Marital status: Single    Spouse name: Not on file   Number of children: Not on file   Years of education: Not on file   Highest education level: Not on file  Occupational History   Not on file  Tobacco Use   Smoking status: Some Days    Current packs/day: 0.25    Types: Cigarettes   Smokeless tobacco: Never   Tobacco comments:    I smoke when I'm drinking  Vaping Use   Vaping status: Never Used   Substance and Sexual Activity   Alcohol use: Yes    Comment: occ   Drug use: Not Currently    Types: Marijuana   Sexual activity: Not Currently    Partners: Male    Comment: First IC <16, Partners >5, Hx of CT+, GC+  Other Topics Concern   Not on file  Social History Narrative   Not on file   Social Drivers of Health   Financial Resource Strain: Not on file  Food Insecurity: Low Risk  (11/28/2023)   Received from [facility]   Hunger Vital Sign    Within the past 12 months, you worried that your food would run out before you got money to buy more: Never true    Within the past 12 months, the food you bought just didn't last and you didn't have money to get more. : Never true  Transportation Needs: No Transportation Needs (11/28/2023)   Received from [facility]    In the past 12 months, has lack of reliable transportation kept you from medical appointments, meetings, work or from getting things needed for daily living? : No  Physical Activity: Not on file  Stress: Not on file  Social Connections: Unknown (08/25/2021)   Received from [facility]   Social Network    Social Network: Not on file  Intimate Partner Violence: Unknown (07/30/2021)   Received from [facility]   HITS    Physically Hurt: Not on file    Insult or Talk Carrillo To: Not on file    Threaten Physical Harm: Not on file    Scream or Curse: Not on file    Past Medical History, Surgical history, Social history, and Family history were reviewed and updated as appropriate.   Please see review of systems for further details on the patient's review from today.   Objective:   Physical Exam:  There were no vitals taken for this visit.  Physical Exam Constitutional:      General: She is not in acute distress. Musculoskeletal:        General: No deformity.  Neurological:     Mental Status: She is alert and oriented  to person, place, and time.     Coordination: Coordination normal.   Psychiatric:        Attention and Perception: Attention and perception normal. She does not perceive auditory or visual hallucinations.        Mood and Affect: Mood normal. Mood is not anxious or depressed. Affect is not labile, blunt, angry or inappropriate.        Speech: Speech normal.        Behavior: Behavior normal.        Thought Content: Thought content normal. Thought content is not paranoid or delusional. Thought content does not include homicidal or suicidal ideation. Thought content does not include homicidal or suicidal plan.        Cognition and Memory: Cognition and memory normal.        Judgment: Judgment normal.     Comments: Insight intact     Lab Review:     Component Value Date/Time   NA 131 (L) 05/25/2023 2132   NA 141 08/31/2022 0954   K 3.5 05/25/2023 2132   CL 94 (L) 05/25/2023 2132   CO2 25 05/25/2023 2132   GLUCOSE 314 (H) 05/25/2023 2132   BUN 10 05/25/2023 2132   BUN 12 08/31/2022 0954   CREATININE 0.87 05/25/2023 2132   CALCIUM  9.5 05/25/2023 2132   PROT 8.4 (H) 05/25/2023 2132   PROT 7.6 08/31/2022 0954   ALBUMIN 4.4 05/25/2023 2132   ALBUMIN 4.3 08/31/2022 0954   AST 71 (H) 05/25/2023 2132   ALT 86 (H) 05/25/2023 2132   ALKPHOS 70 05/25/2023 2132   BILITOT 1.0 05/25/2023 2132   BILITOT 0.3 08/31/2022 0954   GFRNONAA >60 05/25/2023 2132   GFRAA >60 10/10/2019 1441       Component Value Date/Time   WBC 11.8 (H) 05/25/2023 2132   RBC 4.51 05/25/2023 2132   HGB 15.1 (H) 05/25/2023 2132   HGB 13.9 08/31/2022 0954   HCT 42.4 05/25/2023 2132   HCT 41.0 08/31/2022 0954   PLT 187 05/25/2023 2132   PLT 249 08/31/2022 0954   MCV 94.0 05/25/2023 2132   MCV 96 08/31/2022 0954   MCH 33.5 05/25/2023 2132   MCHC 35.6 05/25/2023 2132   RDW 12.5 05/25/2023 2132   RDW 12.6 08/31/2022 0954   LYMPHSABS 0.5 (L) 05/25/2023 2132   LYMPHSABS 2.3 08/31/2022 0954   MONOABS 0.5 05/25/2023 2132   EOSABS 0.2 05/25/2023 2132   EOSABS 0.3 08/31/2022 0954    BASOSABS 0.0 05/25/2023 2132   BASOSABS 0.1 08/31/2022 0954    No results found for: POCLITH, LITHIUM   No results found for: PHENYTOIN, PHENOBARB, VALPROATE, CBMZ   .res Assessment: Plan:    Plan:  PDMP reviewed  Xanax  1mg  BID as needed for anxiety and panic attacks Hydroxyzine  25mg  TID as needed Prozac  10mg  daily   Patient is released to return to work on 01/06/2024 without restrictions.  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  Gene sight testing available.  Working with a Paramedic.  RTC 4 weeks  25 minutes spent dedicated to the care of this patient on the date of this encounter to include pre-visit review of records, ordering of medication, post visit documentation, and face-to-face time with the patient discussing MDD, mixed obsessional thoughts, insomnia, panic attacks, and GAD. Discussed continuing current medication regimen. Patient was advised to contact office with any questions, adverse effects, or acute worsening in signs and symptoms.  Discussed potential benefits, risk, and side effects of benzodiazepines to  include potential risk of tolerance and dependence, as well as possible drowsiness. Advised patient not to drive if experiencing drowsiness and to take lowest possible effective dose to minimize risk of dependence and tolerance.  Diagnoses and all orders for this visit:  Major depressive disorder, recurrent episode, moderate (HCC)  Panic attacks -     ALPRAZolam  (XANAX ) 1 MG tablet; Take 1 tablet (1 mg total) by mouth 2 (two) times daily as needed for anxiety.  Insomnia, unspecified type -     ALPRAZolam  (XANAX ) 1 MG tablet; Take 1 tablet (1 mg total) by mouth 2 (two) times daily as needed for anxiety.  Generalized anxiety disorder  Mixed obsessional thoughts and acts     Please see After Visit Summary for patient specific instructions.  Future Appointments  Date Time Provider Department Center  03/01/2024   3:00 PM Prentiss Annabella LABOR, NP [facility] None  04/05/2024 11:00 AM Channon Ambrosini Nattalie, NP [facility] None     No orders of the defined types were placed in this encounter.     -------------------------------

## 2024-01-12 ENCOUNTER — Ambulatory Visit: Admitting: Nurse Practitioner

## 2024-01-12 NOTE — Progress Notes (Deleted)
   Gwendolyn Carrillo May 20, 1975 [id number]   History:  48 y.o. [id number] presents for annual exam. H/O 2009 ablation, has light spotting every few months. Normal pap history. DM, HLD, HTN managed by PCP. Sees therapist. H/O GAD, anxiety, panic attacks managed by psychiatry. Hydroxyzine  50 mg HS, Xanax  TID as needed. She has tried many antidepressants and does not tolerate. Smokes ~4 cigarettes per day, began smoking at age 55.   Gynecologic History No LMP recorded. Patient has had an ablation.   Contraception/Family planning: tubal ligation Sexually active: No  Health Maintenance Last Pap: 10/2021. Results were: Normal per patient Last mammogram: 10/2021. Results were: Normal per patient Last colonoscopy: Never. Neg Cologuard 08/31/22 Last Dexa: Not indicated     01/05/2024    2:52 PM  Depression screen PHQ 2/9  Decreased Interest   Down, Depressed, Hopeless   PHQ - 2 Score      Information is confidential and restricted. Go to Review Flowsheets to unlock data.     Past medical history, past surgical history, family history and social history were all reviewed and documented in the EPIC chart. Works in call center for [employer]. 2 sons, 2 daughters.   ROS:  A ROS was performed and pertinent positives and negatives are included.  Exam:  There were no vitals filed for this visit.  There is no height or weight on file to calculate BMI.  General appearance:  Normal Psych: depressed mood, crying during visit  Assessment/Plan:  48 y.o. [id number] for annual exam.     Gwendolyn DELENA Shutter DNP, 8:15 AM 01/12/2024

## 2024-01-24 ENCOUNTER — Other Ambulatory Visit: Payer: Self-pay | Admitting: Nurse Practitioner

## 2024-01-24 DIAGNOSIS — Z30011 Encounter for initial prescription of contraceptive pills: Secondary | ICD-10-CM

## 2024-01-24 NOTE — Telephone Encounter (Signed)
 Pt has schedule an AEX for 02/23/24.

## 2024-01-24 NOTE — Telephone Encounter (Signed)
 Med refill request:  Norethindrone  (Micronor ) 0.35 mg tab Starting Fri 08/05/2023, Dispense: #84 tablet - Refills: 0 ordered [facility] has been asked to schedule Pt for an AEX.  Last AEX:  02/10/22 Next AEX: Cancelled the 03/01/24 AEX Last MMG (if hormonal med):  11/2017 Refill authorized? Please Advise.

## 2024-01-25 ENCOUNTER — Other Ambulatory Visit: Payer: Self-pay | Admitting: Nurse Practitioner

## 2024-01-25 DIAGNOSIS — Z30011 Encounter for initial prescription of contraceptive pills: Secondary | ICD-10-CM

## 2024-01-25 MED ORDER — NORETHINDRONE 0.35 MG PO TABS: 1.0000 | ORAL_TABLET | Freq: Every day | ORAL | 0 refills | Status: AC

## 2024-01-25 NOTE — Telephone Encounter (Signed)
 RX was already sent in to the pharmacy, I have refused this request.

## 2024-01-27 ENCOUNTER — Other Ambulatory Visit: Payer: Self-pay

## 2024-01-27 DIAGNOSIS — F339 Major depressive disorder, recurrent, unspecified: Secondary | ICD-10-CM

## 2024-01-27 MED ORDER — SPRAVATO (56 MG DOSE) 28 MG/DEVICE NA SOPK: 56.0000 mg | PACK | Freq: Once | NASAL | Status: AC

## 2024-01-30 ENCOUNTER — Telehealth: Payer: Self-pay | Admitting: Adult Health

## 2024-01-30 NOTE — Telephone Encounter (Signed)
 Rec fax from [facility] for Disability forms. Pt states she is not able to pay until 10/13. Advised we will hold them until then. Pt also said she would talk with Traci tomorrow 10/8 to clarify disability dates.

## 2024-01-30 NOTE — Telephone Encounter (Signed)
 Just to clarify, you are taking her out of work for Spravato treatments? Pt will start tomorrow 10/07

## 2024-01-31 ENCOUNTER — Ambulatory Visit

## 2024-01-31 ENCOUNTER — Ambulatory Visit (INDEPENDENT_AMBULATORY_CARE_PROVIDER_SITE_OTHER): Admitting: Psychiatry

## 2024-01-31 ENCOUNTER — Encounter: Payer: Self-pay | Admitting: Psychiatry

## 2024-01-31 VITALS — BP 133/82 | HR 79

## 2024-01-31 DIAGNOSIS — F339 Major depressive disorder, recurrent, unspecified: Secondary | ICD-10-CM

## 2024-01-31 DIAGNOSIS — F410 Panic disorder [episodic paroxysmal anxiety] without agoraphobia: Secondary | ICD-10-CM

## 2024-01-31 DIAGNOSIS — F422 Mixed obsessional thoughts and acts: Secondary | ICD-10-CM

## 2024-01-31 DIAGNOSIS — F411 Generalized anxiety disorder: Secondary | ICD-10-CM

## 2024-01-31 DIAGNOSIS — G4700 Insomnia, unspecified: Secondary | ICD-10-CM

## 2024-01-31 NOTE — Progress Notes (Unsigned)
 NURSES NOTE:         Pt arrived for her 1st Spravato Treatment for treatment resistant depression, the starting dose is 56 mg (2 of the 28 mg nasal sprays), after her first treatment Dr. Geoffry will let me know how to proceed with her next dose. Pt sees Dr. Geoffry so he will follow her throughout her treatments and follow ups. Explained to pt how the treatments would be scheduled and answered any questions she had today. She was given a practice nasal trainer to try a few times before given the one with medication. She verbalized understanding. Pt's Spravato is billed through buy and bill medically.  Spravato medication is stored at treatment center per REMS/FDA guidelines. The medication is required to be locked behind two doors per REMS/FDA protocol. Medication is also disposed of properly after each use per regulations. All documentation for REMS is completed and submitted per FDA/REMS requirements.          Began taking patient's vital signs at 2:50 PM 119/71, pulse 78. SpO2 Sat. 98%. Pt was given the trainer to practice with after watching the demonstration on how to use the nasal inhaler, she verbalized understanding. Explained any further questions or concerns before getting started but she reports we discussed covered everything over the phone on Friday. Instructed patient to blow her nose if needed then recline back to a 45 degree angle. Gave patient first dose 28 mg nasal spray, administered in each nostril as directed and observed by nurse, waited 5 more minutes for the second dose. After both doses given pt did not complain of any nausea/vomiting, pt did have a drink to help with the taste of Spravato it gives the metallic taste. Assessed her 40 minute vitals, 3:35 PM, 132/69, pulse 83, SpO2 98%. Pt reports she doesn't know how she can describe it but she is feeling something. No complaints voiced  Explained she would be monitored for a total time of 120 minutes. Discharge vitals were taken at  5:05 PM 116/62, P 79, SpO2 98%. Dr. Geoffry came to visit with patient once her thoughts were clearer to discuss how treatment went. Recommend she go home and sleep or just relax on the couch. No driving, no intense activities. Verbalized understanding. Pt. will be receiving 2 treatments per week for 4 weeks as recommended. Nurse was with pt a total of 60 minutes for clinical assessment. Pt's friend dropped her off today and her neighbor will pick her up. Pt is scheduled this Thursday, August 21st and Dr. Geoffry advises to bump her up to 84 mg. Pt's PHQ-9 completed prior to starting treatment was 20 (severe depression). Instructed to call with any issues prior to next apt.    LOT [id number] EXP FEB 2028

## 2024-02-01 ENCOUNTER — Encounter: Payer: Self-pay | Admitting: Adult Health

## 2024-02-01 ENCOUNTER — Telehealth (INDEPENDENT_AMBULATORY_CARE_PROVIDER_SITE_OTHER): Admitting: Adult Health

## 2024-02-01 DIAGNOSIS — F410 Panic disorder [episodic paroxysmal anxiety] without agoraphobia: Secondary | ICD-10-CM | POA: Diagnosis not present

## 2024-02-01 DIAGNOSIS — F422 Mixed obsessional thoughts and acts: Secondary | ICD-10-CM

## 2024-02-01 DIAGNOSIS — F339 Major depressive disorder, recurrent, unspecified: Secondary | ICD-10-CM | POA: Diagnosis not present

## 2024-02-01 DIAGNOSIS — F411 Generalized anxiety disorder: Secondary | ICD-10-CM

## 2024-02-01 DIAGNOSIS — G4700 Insomnia, unspecified: Secondary | ICD-10-CM | POA: Diagnosis not present

## 2024-02-01 MED ORDER — ALPRAZOLAM 1 MG PO TABS: 1.0000 mg | ORAL_TABLET | Freq: Two times a day (BID) | ORAL | 0 refills | Status: DC | PRN

## 2024-02-01 NOTE — Progress Notes (Signed)
 [name] [id number] 1975-10-06 48 y.o.  Virtual Visit via Video Note  I connected with pt @ on 02/01/24 at  4:30 PM EDT by a video enabled telemedicine application and verified that I am speaking with the correct person using two identifiers.   I discussed the limitations of evaluation and management by telemedicine and the availability of in person appointments. The patient expressed understanding and agreed to proceed.  I discussed the assessment and treatment plan with the patient. The patient was provided an opportunity to ask questions and all were answered. The patient agreed with the plan and demonstrated an understanding of the instructions.   The patient was advised to call back or seek an in-person evaluation if the symptoms worsen or if the condition fails to improve as anticipated.  I provided 25 minutes of non-face-to-face time during this encounter.  The patient was located at home.  The provider was located at [facility].   Gwendolyn LOISE Sayers, NP   Subjective:   Patient ID:  Gwendolyn Carrillo is a 48 y.o. (DOB Apr 26, 1976) female.  Chief Complaint: No chief complaint on file.   HPI Gwendolyn Carrillo presents for follow-up of MDD, mixed obsessional thoughts, insomnia, panic attacks and GAD.   Describes mood today as so-so. Pleasant. Cooperative. Stating I feel a little drowsy today. Reports some uncertainty - adding to mood symptoms. Reports first Spravato treatment 01/31/2024. Stating I was stressing and over thinking it - but I did feel a lot of relief. Mood symptoms - reports feeling depressed - not excited or happy. Reports decreased interest and motivation. Stating I still feel alone. Reports mild anxiety. Denies irritability. Reports recent panic attacks - last one was Friday 01/27/2024. Reports decreased worry, rumination and over thinking with Spravato treatment.  Reports ongoing situational stressors - family and financial stressors.  Reports mood is a little better today after her Spravato treatment. Stating I feel like I can tell a difference already. Reports seeing a therapist - Verne[facility] - [place]. Taking medications as prescribed.  Energy levels lower. Active, does not have a regular exercise routine. She is unable to enjoy usual interests and activities - I'm feeling very hopeful now. Single. Lives alone. Has 4 children. Family local. Spending time with family.  Appetite stable. Weight gain - 190's pounds.  Reports sleep has been an issue for several months. Reports sleeping well after first Spravato treatment. Stating that is the first time I've rested like that in a long time.  Reports focus and concentration difficulties - stating it's been the same up until the first Spravato treatment. Completing some tasks around the house. Reports improved self care. Typically working full time - remote position with [employer].  Denies SI or HI.  Denies AH or VH. Denies self harm. Denies substance use. Denies alcohol use.  Previous medications: Zoloft , Clonazepam   Review of Systems:  Review of Systems  Musculoskeletal:  Negative for gait problem.  Neurological:  Negative for tremors.  Psychiatric/Behavioral:         Please refer to HPI    Medications: I have reviewed the patient's current medications.  Current Outpatient Medications  Medication Sig Dispense Refill   ALPRAZolam  (XANAX ) 1 MG tablet Take 1 tablet (1 mg total) by mouth 2 (two) times daily as needed for anxiety. 60 tablet 0   atorvastatin  (LIPITOR) 80 MG tablet Take 1 tablet (80 mg total) by mouth daily. 90 tablet 1   Blood Glucose Monitoring Suppl (ONETOUCH VERIO) w/Device KIT  USE TO CHECK BLOOD SUGAR THREE TIMES DAILY. 1 kit 0   Cholecalciferol (VITAMIN D3) 50 MCG (2000 UT) capsule Take 1 capsule (2,000 Units total) by mouth daily. (Patient not taking: Reported on 01/31/2024) 90 capsule 1   clindamycin  (CLEOCIN ) 2 % vaginal cream Place 1 Applicatorful  vaginally at bedtime. 40 g 0   Continuous Blood Gluc Receiver (DEXCOM G6 RECEIVER) DEVI 1 Bag by Does not apply route in the morning, at noon, and at bedtime. 1 each 0   Continuous Glucose Sensor (DEXCOM G6 SENSOR) MISC USE AS DIRECTED EVERY 10 DAYS 9 each 2   Continuous Glucose Transmitter (DEXCOM G6 TRANSMITTER) MISC USE AS DIRECTED OVER 90 DAYS 1 each 2   Continuous Glucose Transmitter (DEXCOM G6 TRANSMITTER) MISC USE AS DIRECTED OVER 90 DAYS     cyclobenzaprine  (FLEXERIL ) 10 MG tablet Take 1 tablet (10 mg total) by mouth 2 (two) times daily as needed for muscle spasms. 12 tablet 0   Elastic Bandages & Supports (ELBOW COMPRESSION) MISC 1 Application by Does not apply route daily. 1 each 0   FLUoxetine  (PROZAC ) 10 MG capsule Take 1 capsule (10 mg total) by mouth daily. 30 capsule 2   glucose blood (ONETOUCH VERIO) test strip USE TO CHECK BLOOD SUGAR THREE TIMES DAILY. 100 each 2   hydrochlorothiazide  (HYDRODIURIL ) 25 MG tablet Take 1 tablet (25 mg total) by mouth daily. 90 tablet 1   hydrOXYzine  (ATARAX ) 25 MG tablet Take 1 tablet (25 mg total) by mouth 3 (three) times daily as needed. 90 tablet 1   Insulin  Glargine (BASAGLAR  KWIKPEN) 100 UNIT/ML Inject 15 Units into the skin daily. 15 mL 0   insulin  lispro (HUMALOG  KWIKPEN) 100 UNIT/ML KwikPen INJECT up to 12u INTO THE SKIN 3 TIMES DAILY WITH MEALS per sliding scale: 200-250: 4 UNITS, 251-300: 6 UNITS, 301-350: 8 UNITS, 351-400: 10 UNITS,>400:12 UNITS AND CALL NP. Max daily dose: 36u 30 mL 0   Insulin  Pen Needle (PEN NEEDLES) 32G X 5 MM MISC Use to inject sliding scale Humalog  three times daily. 100 each 6   JARDIANCE  10 MG TABS tablet Take 10 mg by mouth daily.     lidocaine  (LIDODERM ) 5 % Place 1 patch onto the skin daily. Remove & Discard patch within 12 hours or as directed by MD 30 patch 0   LINZESS  145 MCG CAPS capsule Take 1 capsule (145 mcg total) by mouth daily as needed (constipation). 90 capsule 1   lisinopril  (ZESTRIL ) 20 MG tablet  Take 1 tablet (20 mg total) by mouth daily. 90 tablet 1   metFORMIN (GLUCOPHAGE) 500 MG tablet Take 500 mg by mouth daily with breakfast.     metoCLOPramide  (REGLAN ) 10 MG tablet Take 10 mg by mouth every 6 (six) hours as needed.     metoprolol  tartrate (LOPRESSOR ) 25 MG tablet TAKE 1 TABLET BY MOUTH TWICE A DAY 60 tablet 0   norethindrone  (MICRONOR ) 0.35 MG tablet Take 1 tablet (0.35 mg total) by mouth daily. 28 tablet 0   omeprazole  (PRILOSEC) 20 MG capsule TAKE 1 CAPSULE BY MOUTH DAILY IF NEEDED FOR GASTRIC REFLUX 90 capsule 0   ondansetron  (ZOFRAN -ODT) 4 MG disintegrating tablet TAKE 1 TABLET BY MOUTH EVERY 8 HOURS AS NEEDED FOR NAUSEA AND VOMITING 12 tablet 0   OneTouch Delica Lancets 33G MISC USE TO CHECK BLOOD SUGAR THREE TIMES DAILY. 100 each 2   telmisartan (MICARDIS) 80 MG tablet Take 1 tablet by mouth daily.     No current facility-administered medications  for this visit.    Medication Side Effects: None  Allergies: Not on File  Past Medical History:  Diagnosis Date   Anxiety    Diabetes mellitus without complication (HCC)    Gastroparesis    Hypertension    Major depressive disorder     Family History  Problem Relation Age of Onset   Hyperlipidemia Mother    Hypertension Mother    Hyperlipidemia Father    Hypertension Father    Diabetes Father    Diabetes Sister    Blindness Sister    Hypertension Brother    Diabetes Brother    Stroke Brother    Diabetes Maternal Aunt    Diabetes Maternal Grandmother    Hypertension Maternal Grandmother    Heart disease Maternal Grandmother    Diabetes Paternal Grandmother    Hypertension Paternal Grandmother    Heart disease Paternal Grandmother     Social History   Socioeconomic History   Marital status: Single    Spouse name: Not on file   Number of children: Not on file   Years of education: Not on file   Highest education level: Not on file  Occupational History   Not on file  Tobacco Use   Smoking status:  Some Days    Current packs/day: 0.25    Types: Cigarettes   Smokeless tobacco: Never   Tobacco comments:    I smoke when I'm drinking  Vaping Use   Vaping status: Never Used  Substance and Sexual Activity   Alcohol use: Yes    Comment: occ   Drug use: Not Currently    Types: Marijuana   Sexual activity: Not Currently    Partners: Male    Comment: First IC <16, Partners >5, Hx of CT+, GC+  Other Topics Concern   Not on file  Social History Narrative   Not on file   Social Drivers of Health   Financial Resource Strain: Not on file  Food Insecurity: Low Risk  (11/28/2023)   Received from [facility]   Hunger Vital Sign    Within the past 12 months, you worried that your food would run out before you got money to buy more: Never true    Within the past 12 months, the food you bought just didn't last and you didn't have money to get more. : Never true  Transportation Needs: No Transportation Needs (11/28/2023)   Received from [facility]    In the past 12 months, has lack of reliable transportation kept you from medical appointments, meetings, work or from getting things needed for daily living? : No  Physical Activity: Not on file  Stress: Not on file  Social Connections: Unknown (08/25/2021)   Received from [facility]   Social Network    Social Network: Not on file  Intimate Partner Violence: Unknown (07/30/2021)   Received from [facility]   HITS    Physically Hurt: Not on file    Insult or Talk Down To: Not on file    Threaten Physical Harm: Not on file    Scream or Curse: Not on file    Past Medical History, Surgical history, Social history, and Family history were reviewed and updated as appropriate.   Please see review of systems for further details on the patient's review from today.   Objective:   Physical Exam:  There were no vitals taken for this visit.  Physical Exam Constitutional:      General: She is  not in acute  distress. Musculoskeletal:        General: No deformity.  Neurological:     Mental Status: She is alert and oriented to person, place, and time.     Coordination: Coordination normal.  Psychiatric:        Attention and Perception: Attention and perception normal. She does not perceive auditory or visual hallucinations.        Mood and Affect: Mood is anxious and depressed. Affect is flat. Affect is not labile, blunt, angry or inappropriate.        Speech: Speech normal.        Behavior: Behavior normal.        Thought Content: Thought content normal. Thought content is not paranoid or delusional. Thought content does not include homicidal or suicidal ideation. Thought content does not include homicidal or suicidal plan.        Cognition and Memory: Cognition and memory normal.        Judgment: Judgment normal.     Comments: Insight intact     Lab Review:     Component Value Date/Time   NA 131 (L) 05/25/2023 2132   NA 141 08/31/2022 0954   K 3.5 05/25/2023 2132   CL 94 (L) 05/25/2023 2132   CO2 25 05/25/2023 2132   GLUCOSE 314 (H) 05/25/2023 2132   BUN 10 05/25/2023 2132   BUN 12 08/31/2022 0954   CREATININE 0.87 05/25/2023 2132   CALCIUM  9.5 05/25/2023 2132   PROT 8.4 (H) 05/25/2023 2132   PROT 7.6 08/31/2022 0954   ALBUMIN 4.4 05/25/2023 2132   ALBUMIN 4.3 08/31/2022 0954   AST 71 (H) 05/25/2023 2132   ALT 86 (H) 05/25/2023 2132   ALKPHOS 70 05/25/2023 2132   BILITOT 1.0 05/25/2023 2132   BILITOT 0.3 08/31/2022 0954   GFRNONAA >60 05/25/2023 2132   GFRAA >60 10/10/2019 1441       Component Value Date/Time   WBC 11.8 (H) 05/25/2023 2132   RBC 4.51 05/25/2023 2132   HGB 15.1 (H) 05/25/2023 2132   HGB 13.9 08/31/2022 0954   HCT 42.4 05/25/2023 2132   HCT 41.0 08/31/2022 0954   PLT 187 05/25/2023 2132   PLT 249 08/31/2022 0954   MCV 94.0 05/25/2023 2132   MCV 96 08/31/2022 0954   MCH 33.5 05/25/2023 2132   MCHC 35.6 05/25/2023 2132   RDW 12.5 05/25/2023 2132    RDW 12.6 08/31/2022 0954   LYMPHSABS 0.5 (L) 05/25/2023 2132   LYMPHSABS 2.3 08/31/2022 0954   MONOABS 0.5 05/25/2023 2132   EOSABS 0.2 05/25/2023 2132   EOSABS 0.3 08/31/2022 0954   BASOSABS 0.0 05/25/2023 2132   BASOSABS 0.1 08/31/2022 0954    No results found for: POCLITH, LITHIUM   No results found for: PHENYTOIN, PHENOBARB, VALPROATE, CBMZ   .res Assessment: Plan:    Plan:  PDMP reviewed  Continue: Xanax  1mg  BID as needed for anxiety and panic attacks Hydroxyzine  25mg  TID as needed  Patient is currently on a leave of absence while receiving Spravato treatments.  Gene sight testing available.  Working with a Paramedic.  RTC 4 weeks  25 minutes spent dedicated to the care of this patient on the date of this encounter to include pre-visit review of records, ordering of medication, post visit documentation, and face-to-face time with the patient discussing TRD, mixed obsessional thoughts, insomnia, panic attacks, and GAD. Discussed continuing current medication regimen. Patient was advised to contact office with any questions, adverse effects, or  acute worsening in signs and symptoms. Will continue current treatment - Spravato.  Discussed potential benefits, risk, and side effects of benzodiazepines to include potential risk of tolerance and dependence, as well as possible drowsiness. Advised patient not to drive if experiencing drowsiness and to take lowest possible effective dose to minimize risk of dependence and tolerance.  Diagnoses and all orders for this visit:  Panic attacks -     ALPRAZolam  (XANAX ) 1 MG tablet; Take 1 tablet (1 mg total) by mouth 2 (two) times daily as needed for anxiety.  Insomnia, unspecified type -     ALPRAZolam  (XANAX ) 1 MG tablet; Take 1 tablet (1 mg total) by mouth 2 (two) times daily as needed for anxiety.     Please see After Visit Summary for patient specific instructions.  Future Appointments  Date Time Provider  Department Center  02/02/2024  9:30 AM Cottle, Lorene KANDICE Raddle., MD [facility] None  02/02/2024  9:30 AM CP-NURSE [facility] None  02/23/2024 12:00 PM Prentiss Riggs A, NP [facility] None  04/05/2024 11:00 AM Alexys Gassett Nattalie, NP [facility] None    No orders of the defined types were placed in this encounter.     -------------------------------

## 2024-02-01 NOTE — Progress Notes (Signed)
 [name] [id number] 1975-06-16 48 y.o.  Subjective:   Patient ID:  Gwendolyn Carrillo is a 48 y.o. (DOB 02/12/1976) female.  Chief Complaint:  Chief Complaint  Patient presents with   Follow-up   Depression    HPI Gwendolyn Carrillo presents to the office today for follow-up of treatment resistant major depression.  She also has a history of generalized anxiety disorder, OCD, and panic.  She describes a long history of depression and anxiety and has been unable to tolerate most antidepressants.  She is very eager to pursue Spravato for relief from her depression.    Her PHQ-9 score was 22 today.  She describes symptoms of anhedonia, low motivation, difficulty concentrating, poor productivity, difficulty being around people and wanting to isolate.  She is not suicidal but she has had hopelessness other than this opportunity to pursue Spravato.  She does not feel like she can function at work and is discussing leave of absence with her usual provider [employer], PNP.  She was so excited to start treatment today that she had trouble sleeping last night. She received her first administration of Spravato at 56 mg today.  She had no untoward side effects such as nausea dizziness headache or vomiting.  She did have the expected dissociation but it was not scary.  In fact she experienced a relief from negative emotions such as depression and anxiety while under treatment. She was very drowsy and fell asleep.  She explained it as being due to being sleep deprived.  She is eager to continue the Spravato.     ECT-MADRS    Flowsheet Row Video Visit from 01/05/2024 in [name]  MADRS Total Score 31   GAD-7    Flowsheet Row Office Visit from 08/31/2022 in [facility] Office Visit from 03/03/2022 in [facility] Office Visit from 11/25/2021 in [facility] Office Visit from  08/12/2021 in [facility] Office Visit from 04/01/2021 in [facility]  Total GAD-7 Score 12 14 5 8 16    PHQ2-9    Flowsheet Row Video Visit from 01/05/2024 in [name] Office Visit from 08/31/2022 in [facility] Office Visit from 03/03/2022 in [facility] Office Visit from 02/10/2022 in [facility] Office Visit from 11/25/2021 in [facility]  PHQ-2 Total Score 4 4 6 5 2   PHQ-9 Total Score -- 17 14 13 8    Flowsheet Row ED from 05/26/2023 in [facility] Emergency Department at [facility] ED from 12/11/2022 in [facility] Emergency Department at [facility] ED from 05/05/2022 in [facility] Emergency Department at [facility]  C-SSRS RISK CATEGORY No Risk No Risk No Risk     Review of Systems:  Review of Systems  Cardiovascular:  Negative for chest pain and palpitations.  Gastrointestinal:  Negative for nausea and vomiting.  Neurological:  Negative for dizziness and headaches.  Psychiatric/Behavioral:  Positive for decreased concentration, dysphoric mood and sleep disturbance. The patient is nervous/anxious.     Medications: I have reviewed the patient's current medications.  Current Outpatient Medications  Medication Sig Dispense Refill   ALPRAZolam  (XANAX ) 1 MG tablet Take 1 tablet (1 mg total) by mouth 2 (two) times daily as needed for anxiety. 60 tablet 0   atorvastatin  (LIPITOR) 80 MG tablet Take 1 tablet (80 mg total) by  mouth daily. 90 tablet 1   Blood Glucose Monitoring Suppl (ONETOUCH VERIO) w/Device KIT USE TO CHECK BLOOD SUGAR THREE TIMES DAILY. 1 kit 0   clindamycin  (CLEOCIN ) 2 % vaginal cream Place 1 Applicatorful vaginally at bedtime. 40 g 0   Continuous Blood Gluc Receiver (DEXCOM G6 RECEIVER) DEVI 1 Bag by Does not apply route in the morning, at noon, and at  bedtime. 1 each 0   Continuous Glucose Sensor (DEXCOM G6 SENSOR) MISC USE AS DIRECTED EVERY 10 DAYS 9 each 2   Continuous Glucose Transmitter (DEXCOM G6 TRANSMITTER) MISC USE AS DIRECTED OVER 90 DAYS 1 each 2   Continuous Glucose Transmitter (DEXCOM G6 TRANSMITTER) MISC USE AS DIRECTED OVER 90 DAYS     cyclobenzaprine  (FLEXERIL ) 10 MG tablet Take 1 tablet (10 mg total) by mouth 2 (two) times daily as needed for muscle spasms. 12 tablet 0   Elastic Bandages & Supports (ELBOW COMPRESSION) MISC 1 Application by Does not apply route daily. 1 each 0   FLUoxetine  (PROZAC ) 10 MG capsule Take 1 capsule (10 mg total) by mouth daily. 30 capsule 2   glucose blood (ONETOUCH VERIO) test strip USE TO CHECK BLOOD SUGAR THREE TIMES DAILY. 100 each 2   hydrochlorothiazide  (HYDRODIURIL ) 25 MG tablet Take 1 tablet (25 mg total) by mouth daily. 90 tablet 1   hydrOXYzine  (ATARAX ) 25 MG tablet Take 1 tablet (25 mg total) by mouth 3 (three) times daily as needed. 90 tablet 1   Insulin  Glargine (BASAGLAR  KWIKPEN) 100 UNIT/ML Inject 15 Units into the skin daily. 15 mL 0   insulin  lispro (HUMALOG  KWIKPEN) 100 UNIT/ML KwikPen INJECT up to 12u INTO THE SKIN 3 TIMES DAILY WITH MEALS per sliding scale: 200-250: 4 UNITS, 251-300: 6 UNITS, 301-350: 8 UNITS, 351-400: 10 UNITS,>400:12 UNITS AND CALL NP. Max daily dose: 36u 30 mL 0   Insulin  Pen Needle (PEN NEEDLES) 32G X 5 MM MISC Use to inject sliding scale Humalog  three times daily. 100 each 6   JARDIANCE  10 MG TABS tablet Take 10 mg by mouth daily.     lidocaine  (LIDODERM ) 5 % Place 1 patch onto the skin daily. Remove & Discard patch within 12 hours or as directed by MD 30 patch 0   LINZESS  145 MCG CAPS capsule Take 1 capsule (145 mcg total) by mouth daily as needed (constipation). 90 capsule 1   lisinopril  (ZESTRIL ) 20 MG tablet Take 1 tablet (20 mg total) by mouth daily. 90 tablet 1   metFORMIN (GLUCOPHAGE) 500 MG tablet Take 500 mg by mouth daily with breakfast.      metoCLOPramide  (REGLAN ) 10 MG tablet Take 10 mg by mouth every 6 (six) hours as needed.     metoprolol  tartrate (LOPRESSOR ) 25 MG tablet TAKE 1 TABLET BY MOUTH TWICE A DAY 60 tablet 0   norethindrone  (MICRONOR ) 0.35 MG tablet Take 1 tablet (0.35 mg total) by mouth daily. 28 tablet 0   omeprazole  (PRILOSEC) 20 MG capsule TAKE 1 CAPSULE BY MOUTH DAILY IF NEEDED FOR GASTRIC REFLUX 90 capsule 0   ondansetron  (ZOFRAN -ODT) 4 MG disintegrating tablet TAKE 1 TABLET BY MOUTH EVERY 8 HOURS AS NEEDED FOR NAUSEA AND VOMITING 12 tablet 0   OneTouch Delica Lancets 33G MISC USE TO CHECK BLOOD SUGAR THREE TIMES DAILY. 100 each 2   Cholecalciferol (VITAMIN D3) 50 MCG (2000 UT) capsule Take 1 capsule (2,000 Units total) by mouth daily. (Patient not taking: Reported on 01/31/2024) 90 capsule 1   telmisartan (MICARDIS) 80 MG  tablet Take 1 tablet by mouth daily.     No current facility-administered medications for this visit.    Medication Side Effects: None  Allergies: Not on File  Past Medical History:  Diagnosis Date   Anxiety    Diabetes mellitus without complication (HCC)    Gastroparesis    Hypertension    Major depressive disorder     Past Medical History, Surgical history, Social history, and Family history were reviewed and updated as appropriate.   Please see review of systems for further details on the patient's review from today.   Objective:   Physical Exam:  There were no vitals taken for this visit.  Physical Exam Constitutional:      General: She is not in acute distress. Musculoskeletal:        General: No deformity.  Neurological:     Mental Status: She is alert and oriented to person, place, and time.     Coordination: Coordination normal.  Psychiatric:        Attention and Perception: Attention and perception normal. She does not perceive auditory or visual hallucinations.        Mood and Affect: Mood is anxious and depressed. Affect is not labile, blunt, angry or  inappropriate.        Speech: Speech normal.        Behavior: Behavior normal.        Thought Content: Thought content normal. Thought content is not paranoid or delusional. Thought content does not include homicidal or suicidal ideation. Thought content does not include suicidal plan.        Cognition and Memory: Cognition and memory normal.        Judgment: Judgment normal.     Comments: Insight intact     Lab Review:     Component Value Date/Time   NA 131 (L) 05/25/2023 2132   NA 141 08/31/2022 0954   K 3.5 05/25/2023 2132   CL 94 (L) 05/25/2023 2132   CO2 25 05/25/2023 2132   GLUCOSE 314 (H) 05/25/2023 2132   BUN 10 05/25/2023 2132   BUN 12 08/31/2022 0954   CREATININE 0.87 05/25/2023 2132   CALCIUM  9.5 05/25/2023 2132   PROT 8.4 (H) 05/25/2023 2132   PROT 7.6 08/31/2022 0954   ALBUMIN 4.4 05/25/2023 2132   ALBUMIN 4.3 08/31/2022 0954   AST 71 (H) 05/25/2023 2132   ALT 86 (H) 05/25/2023 2132   ALKPHOS 70 05/25/2023 2132   BILITOT 1.0 05/25/2023 2132   BILITOT 0.3 08/31/2022 0954   GFRNONAA >60 05/25/2023 2132   GFRAA >60 10/10/2019 1441       Component Value Date/Time   WBC 11.8 (H) 05/25/2023 2132   RBC 4.51 05/25/2023 2132   HGB 15.1 (H) 05/25/2023 2132   HGB 13.9 08/31/2022 0954   HCT 42.4 05/25/2023 2132   HCT 41.0 08/31/2022 0954   PLT 187 05/25/2023 2132   PLT 249 08/31/2022 0954   MCV 94.0 05/25/2023 2132   MCV 96 08/31/2022 0954   MCH 33.5 05/25/2023 2132   MCHC 35.6 05/25/2023 2132   RDW 12.5 05/25/2023 2132   RDW 12.6 08/31/2022 0954   LYMPHSABS 0.5 (L) 05/25/2023 2132   LYMPHSABS 2.3 08/31/2022 0954   MONOABS 0.5 05/25/2023 2132   EOSABS 0.2 05/25/2023 2132   EOSABS 0.3 08/31/2022 0954   BASOSABS 0.0 05/25/2023 2132   BASOSABS 0.1 08/31/2022 0954    No results found for: POCLITH, LITHIUM   No results found for: PHENYTOIN, PHENOBARB,  VALPROATE, CBMZ   .res Assessment: Plan:    Clarence was seen today for follow-up and  depression.  Diagnoses and all orders for this visit:  Recurrent major depression resistant to treatment  Panic attacks  Insomnia, unspecified type  Generalized anxiety disorder  Mixed obsessional thoughts and acts    Disc her TRD and the SE Spravato.  Patient was administered Spravato 84 mg intranasally today.  The patient experienced the typical dissociation which gradually resolved over the 2-hour period of observation.  There were no complications.  Specifically the patient did not have nausea or vomiting or headache.  Blood pressures remained within normal ranges at the 40-minute and 2-hour follow-up intervals.  By the time the 2-hour observation period was met the patient was alert and oriented and able to exit without assistance.  Patient feels the Spravato administration is helpful for the treatment resistant depression and would like to continue the treatment.  See nursing note for further details.  No meds were changed but BP was elevated prior to administration.  See the nursing note for those details.  She was given clonidine 0.1 mg tablet which she tolerated well and it did help bring down her blood pressure so we were able to pursue Spravato administration today.  She was instructed to check her blood pressure prior to the next visit.  She is compliant with her outpatient blood pressure medicines.  Will pursue 56 mg Spravato at the next visit because she was so drowsy today.  We anticipate going up to 84 mg after that.  Follow-up twice weekly for Spravato administration for 4 weeks at least.  Lorene Macintosh MD, DFAPA  Please see After Visit Summary for patient specific instructions.  Future Appointments  Date Time Provider Department Center  02/01/2024  4:30 PM Mozingo, Angeline Mattocks, NP [facility] None  02/02/2024  9:30 AM Cottle, Lorene KANDICE Raddle., MD [facility] None  02/02/2024  9:30 AM CP-NURSE [facility] None  02/23/2024 12:00 PM Prentiss Riggs A, NP [facility] None  04/05/2024 11:00 AM  Mozingo, Regina Nattalie, NP [facility] None    No orders of the defined types were placed in this encounter.   -------------------------------

## 2024-02-02 ENCOUNTER — Ambulatory Visit

## 2024-02-02 ENCOUNTER — Encounter: Payer: Self-pay | Admitting: Psychiatry

## 2024-02-02 ENCOUNTER — Ambulatory Visit: Admitting: Psychiatry

## 2024-02-02 VITALS — BP 129/83 | HR 79

## 2024-02-02 DIAGNOSIS — F410 Panic disorder [episodic paroxysmal anxiety] without agoraphobia: Secondary | ICD-10-CM

## 2024-02-02 DIAGNOSIS — F339 Major depressive disorder, recurrent, unspecified: Secondary | ICD-10-CM

## 2024-02-02 DIAGNOSIS — F422 Mixed obsessional thoughts and acts: Secondary | ICD-10-CM

## 2024-02-02 DIAGNOSIS — F411 Generalized anxiety disorder: Secondary | ICD-10-CM

## 2024-02-02 NOTE — Progress Notes (Signed)
 Gwendolyn Carrillo [id number] 17-Mar-1976 48 y.o.  Subjective:   Patient ID:  Gwendolyn Carrillo is a 48 y.o. (DOB [birth year]) female.  Chief Complaint:  Chief Complaint  Patient presents with   Follow-up   Depression   Anxiety   Stress    HPI Gwendolyn Carrillo presents to the office today for follow-up of treatment resistant major depression.  She also has a history of generalized anxiety disorder, OCD, and panic.  She describes a long history of depression and anxiety and has been unable to tolerate most antidepressants.  She is very eager to pursue Spravato for relief from her depression.    01/31/24 appt noted:  Her PHQ-9 score was 22 today.  She describes symptoms of anhedonia, low motivation, difficulty concentrating, poor productivity, difficulty being around people and wanting to isolate.  She is not suicidal but she has had hopelessness other than this opportunity to pursue Spravato.  She does not feel like she can function at work and is discussing leave of absence with her usual provider [employer], PNP. She was so excited to start treatment today that she had trouble sleeping last night. She received her first administration of Spravato at 56 mg today.  She had no untoward side effects such as nausea dizziness headache or vomiting.  She did have the expected dissociation but it was not scary.  In fact she experienced a relief from negative emotions such as depression and anxiety while under treatment. She was very drowsy and fell asleep.  She explained it as being due to being sleep deprived.  She is eager to continue the Spravato.  02/02/24 appt noted:  She received her second administration of Spravato at 56 mg today.  She had no untoward side effects such as nausea dizziness headache or vomiting.  She did have the expected dissociation but it was not scary.  In fact she experienced a relief from negative emotions such as depression and anxiety while under treatment. She was  much less drowsy with it.  Would like to increase dose as planned.  No SI.  Tolerating current tx plan.    ECT-MADRS    Flowsheet Row Video Visit from 01/05/2024 in [name]  MADRS Total Score 31   GAD-7    Flowsheet Row Office Visit from 08/31/2022 in [facility] Office Visit from 03/03/2022 in [facility] Office Visit from 11/25/2021 in [facility] Office Visit from 08/12/2021 in [facility] Office Visit from 04/01/2021 in [facility]  Total GAD-7 Score 12 14 5 8 16    PHQ2-9    Flowsheet Row Video Visit from 01/05/2024 in [name] Office Visit from 08/31/2022 in [facility] Office Visit from 03/03/2022 in [facility] Office Visit from 02/10/2022 in [facility] Office Visit from 11/25/2021 in [facility]  PHQ-2 Total Score 4 4 6 5 2   PHQ-9 Total Score -- 17 14 13 8    Flowsheet Row ED from 05/26/2023 in [facility] Emergency Department at [facility] ED from 12/11/2022 in [facility] Emergency Department at [facility] ED from 05/05/2022 in [facility] Emergency Department at [facility]  C-SSRS RISK CATEGORY No Risk No Risk No Risk     Review of Systems:  Review of Systems  Cardiovascular:  Negative for chest pain and palpitations.  Gastrointestinal:  Negative for nausea and vomiting.  Neurological:  Negative for dizziness, weakness and headaches.  Psychiatric/Behavioral:  Positive for decreased concentration, dysphoric mood and sleep disturbance. The patient is nervous/anxious.     Medications: I have reviewed the patient's current medications.  Current Outpatient Medications  Medication Sig Dispense Refill   ALPRAZolam  (XANAX ) 1 MG tablet Take 1 tablet (1 mg  total) by mouth 2 (two) times daily as needed for anxiety. 60 tablet 0   atorvastatin  (LIPITOR) 80 MG tablet Take 1 tablet (80 mg total) by mouth daily. 90 tablet 1   Blood Glucose Monitoring Suppl (ONETOUCH VERIO) w/Device KIT USE TO CHECK BLOOD SUGAR THREE TIMES DAILY. 1 kit 0   clindamycin  (CLEOCIN ) 2 % vaginal cream Place 1 Applicatorful vaginally at bedtime. 40 g 0   Continuous Blood Gluc Receiver (DEXCOM G6 RECEIVER) DEVI 1 Bag by Does not apply route in the morning, at noon, and at bedtime. 1 each 0   Continuous Glucose Sensor (DEXCOM G6 SENSOR) MISC USE AS DIRECTED EVERY 10 DAYS 9 each 2   Continuous Glucose Transmitter (DEXCOM G6 TRANSMITTER) MISC USE AS DIRECTED OVER 90 DAYS 1 each 2   Continuous Glucose Transmitter (DEXCOM G6 TRANSMITTER) MISC USE AS DIRECTED OVER 90 DAYS     cyclobenzaprine  (FLEXERIL ) 10 MG tablet Take 1 tablet (10 mg total) by mouth 2 (two) times daily as needed for muscle spasms. 12 tablet 0   Elastic Bandages & Supports (ELBOW COMPRESSION) MISC 1 Application by Does not apply route daily. 1 each 0   glucose blood (ONETOUCH VERIO) test strip USE TO CHECK BLOOD SUGAR THREE TIMES DAILY. 100 each 2   hydrochlorothiazide  (HYDRODIURIL ) 25 MG tablet Take 1 tablet (25 mg total) by mouth daily. 90 tablet 1   hydrOXYzine  (ATARAX ) 25 MG tablet Take 1 tablet (25 mg total) by mouth 3 (three) times daily as needed. 90 tablet 1   Insulin  Glargine (BASAGLAR  KWIKPEN) 100 UNIT/ML Inject 15 Units into the skin daily. 15 mL 0   insulin  lispro (HUMALOG  KWIKPEN) 100 UNIT/ML KwikPen INJECT up to 12u INTO THE SKIN 3 TIMES DAILY WITH MEALS per sliding scale: 200-250: 4 UNITS, 251-300: 6 UNITS, 301-350: 8 UNITS, 351-400: 10 UNITS,>400:12 UNITS AND CALL NP. Max daily dose: 36u 30 mL 0   Insulin  Pen Needle (PEN NEEDLES) 32G X 5 MM MISC Use to inject sliding scale Humalog  three times daily. 100 each 6   JARDIANCE  10 MG TABS tablet Take 10 mg by mouth daily.     lidocaine  (LIDODERM ) 5 % Place 1  patch onto the skin daily. Remove & Discard patch within 12 hours or as directed by MD 30 patch 0   LINZESS  145 MCG CAPS capsule Take 1 capsule (145 mcg total) by mouth daily as needed (constipation). 90 capsule 1   lisinopril  (ZESTRIL ) 20 MG tablet Take 1 tablet (20 mg total) by mouth daily. 90 tablet 1   metFORMIN (GLUCOPHAGE) 500 MG tablet Take 500 mg by mouth daily with breakfast.     metoCLOPramide  (REGLAN ) 10 MG tablet Take 10 mg by mouth every 6 (six) hours as needed.     metoprolol  tartrate (LOPRESSOR ) 25 MG tablet TAKE 1 TABLET BY MOUTH TWICE A DAY 60 tablet 0   norethindrone  (MICRONOR ) 0.35 MG tablet Take 1 tablet (0.35 mg total) by mouth daily. 28 tablet 0   omeprazole  (PRILOSEC) 20 MG capsule TAKE 1 CAPSULE BY MOUTH DAILY IF NEEDED FOR GASTRIC REFLUX 90 capsule 0   ondansetron  (ZOFRAN -ODT) 4  MG disintegrating tablet TAKE 1 TABLET BY MOUTH EVERY 8 HOURS AS NEEDED FOR NAUSEA AND VOMITING 12 tablet 0   OneTouch Delica Lancets 33G MISC USE TO CHECK BLOOD SUGAR THREE TIMES DAILY. 100 each 2   Cholecalciferol (VITAMIN D3) 50 MCG (2000 UT) capsule Take 1 capsule (2,000 Units total) by mouth daily. (Patient not taking: Reported on 02/02/2024) 90 capsule 1   FLUoxetine  (PROZAC ) 10 MG capsule Take 1 capsule (10 mg total) by mouth daily. (Patient not taking: Reported on 02/10/2024) 30 capsule 2   telmisartan (MICARDIS) 80 MG tablet Take 1 tablet by mouth daily.     No current facility-administered medications for this visit.    Medication Side Effects: None  Allergies: Not on File  Past Medical History:  Diagnosis Date   Anxiety    Diabetes mellitus without complication (HCC)    Gastroparesis    Hypertension    Major depressive disorder     Past Medical History, Surgical history, Social history, and Family history were reviewed and updated as appropriate.   Please see review of systems for further details on the patient's review from today.   Objective:   Physical Exam:  There  were no vitals taken for this visit.  Physical Exam Constitutional:      General: She is not in acute distress. Musculoskeletal:        General: No deformity.  Neurological:     Mental Status: She is alert and oriented to person, place, and time.     Coordination: Coordination normal.  Psychiatric:        Attention and Perception: Attention and perception normal. She does not perceive auditory or visual hallucinations.        Mood and Affect: Mood is anxious and depressed. Affect is not blunt, angry or inappropriate.        Speech: Speech normal.        Behavior: Behavior normal.        Thought Content: Thought content normal. Thought content is not paranoid or delusional. Thought content does not include homicidal or suicidal ideation. Thought content does not include suicidal plan.        Cognition and Memory: Cognition and memory normal.        Judgment: Judgment normal.     Comments: Insight intact     Lab Review:     Component Value Date/Time   NA 131 (L) 05/25/2023 2132   NA 141 08/31/2022 0954   K 3.5 05/25/2023 2132   CL 94 (L) 05/25/2023 2132   CO2 25 05/25/2023 2132   GLUCOSE 314 (H) 05/25/2023 2132   BUN 10 05/25/2023 2132   BUN 12 08/31/2022 0954   CREATININE 0.87 05/25/2023 2132   CALCIUM  9.5 05/25/2023 2132   PROT 8.4 (H) 05/25/2023 2132   PROT 7.6 08/31/2022 0954   ALBUMIN 4.4 05/25/2023 2132   ALBUMIN 4.3 08/31/2022 0954   AST 71 (H) 05/25/2023 2132   ALT 86 (H) 05/25/2023 2132   ALKPHOS 70 05/25/2023 2132   BILITOT 1.0 05/25/2023 2132   BILITOT 0.3 08/31/2022 0954   GFRNONAA >60 05/25/2023 2132   GFRAA >60 10/10/2019 1441       Component Value Date/Time   WBC 11.8 (H) 05/25/2023 2132   RBC 4.51 05/25/2023 2132   HGB 15.1 (H) 05/25/2023 2132   HGB 13.9 08/31/2022 0954   HCT 42.4 05/25/2023 2132   HCT 41.0 08/31/2022 0954   PLT 187 05/25/2023 2132   PLT 249 08/31/2022 0954  MCV 94.0 05/25/2023 2132   MCV 96 08/31/2022 0954   MCH 33.5  05/25/2023 2132   MCHC 35.6 05/25/2023 2132   RDW 12.5 05/25/2023 2132   RDW 12.6 08/31/2022 0954   LYMPHSABS 0.5 (L) 05/25/2023 2132   LYMPHSABS 2.3 08/31/2022 0954   MONOABS 0.5 05/25/2023 2132   EOSABS 0.2 05/25/2023 2132   EOSABS 0.3 08/31/2022 0954   BASOSABS 0.0 05/25/2023 2132   BASOSABS 0.1 08/31/2022 0954    No results found for: POCLITH, LITHIUM   No results found for: PHENYTOIN, PHENOBARB, VALPROATE, CBMZ   .res Assessment: Plan:    Gwendolyn Carrillo was seen today for follow-up, depression, anxiety and stress.  Diagnoses and all orders for this visit:  Recurrent major depression resistant to treatment  Panic attacks  Generalized anxiety disorder  Mixed obsessional thoughts and acts     Disc her TRD and the SE Spravato.  She is very excited to be receiving this bc poor tolerance AD and no sig benefit.  Patient was administered Spravato 56 mg intranasally today.  The patient experienced the typical dissociation which gradually resolved over the 2-hour period of observation.  There were no complications.  Specifically the patient did not have nausea or vomiting or headache.  Blood pressures remained within normal ranges at the 40-minute and 2-hour follow-up intervals.  By the time the 2-hour observation period was met the patient was alert and oriented and able to exit without assistance.  Patient feels the Spravato administration is helpful for the treatment resistant depression and would like to continue the treatment.  See nursing note for further details. Less drowsy from it today.  BP better today but still borderline high  Will pursue 84 mg Spravato at the next visit because she was so not drowsy today.    Follow-up twice weekly for Spravato administration for 4 weeks at least.  Lorene Macintosh MD, DFAPA  Please see After Visit Summary for patient specific instructions.  Future Appointments  Date Time Provider Department Center  02/14/2024  9:30 AM  Cottle, Lorene KANDICE Raddle., MD [facility] None  02/14/2024  9:30 AM CP-NURSE [facility] None  02/16/2024  9:30 AM Cottle, Lorene KANDICE Raddle., MD [facility] None  02/16/2024  9:30 AM CP-NURSE [facility] None  02/21/2024  9:30 AM Cottle, Lorene KANDICE Raddle., MD [facility] None  02/21/2024  9:30 AM CP-NURSE [facility] None  02/23/2024  9:30 AM Cottle, Lorene KANDICE Raddle., MD [facility] None  02/23/2024  9:30 AM CP-NURSE [facility] None  04/05/2024 11:00 AM Mozingo, Regina Nattalie, NP [facility] None    No orders of the defined types were placed in this encounter.   -------------------------------

## 2024-02-06 NOTE — Progress Notes (Signed)
 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 Pt sees [employer] and she will follow up with her for office visits and see her at treatment when she is in the office. Explained to pt how the treatments would be scheduled and answered any questions she had today. Pt's Spravato is billed through buy and bill medically.  Spravato medication is stored at treatment center per REMS/FDA guidelines. The medication is required to be locked behind two doors per REMS/FDA protocol. Medication is also disposed of properly after each use per regulations. All documentation for REMS is completed and submitted per FDA/REMS requirements.          Began taking patient's vital signs at 9:30 AM 149/87, pulse 71. SpO2 Sat. 95%. Waited to discuss with Dr. Geoffry before proceeding due to her elevated B/P's on Tuesday.  Dr. Geoffry agreed ok to administer 56 mg. Explained any further questions or concerns before getting started. Instructed patient to blow her nose if needed then recline back to a 45 degree angle. Gave patient first dose 28 mg nasal spray, administered in each nostril as directed and observed by nurse, waited 5 more minutes for the second dose. After both doses given pt did not complain of any nausea/vomiting, pt did have a drink to help with the taste of Spravato it gives the metallic taste. Assessed her 40 minute vitals, 10:30 PM, 157/99, pulse 79, SpO2 99%. Pt was less sedate but still affected. Explained she would be monitored for a total time of 120 minutes. Discharge vitals were taken at 11:45 AM 129/83, P 79, SpO2 99%. Dr. Geoffry came to visit with patient once her thoughts were clearer to discuss how treatment went. Pt more alert and after discussion it was agreed to increase dose next week to 84 mg. Recommend she go home and  sleep or just relax on the couch. No driving, no intense activities. Verbalized understanding. Pt. will be receiving 2 treatments per week for 4 weeks as recommended. Nurse was with pt a total of 60 minutes for clinical assessment.  Instructed to call with any issues prior to next apt on Tuesday.   LOT [id number] EXP FEB 2028

## 2024-02-07 ENCOUNTER — Ambulatory Visit

## 2024-02-07 ENCOUNTER — Ambulatory Visit (INDEPENDENT_AMBULATORY_CARE_PROVIDER_SITE_OTHER): Admitting: Adult Health

## 2024-02-07 VITALS — BP 108/69 | HR 83

## 2024-02-07 DIAGNOSIS — F339 Major depressive disorder, recurrent, unspecified: Secondary | ICD-10-CM

## 2024-02-07 DIAGNOSIS — Z0289 Encounter for other administrative examinations: Secondary | ICD-10-CM

## 2024-02-07 NOTE — Progress Notes (Unsigned)
 RIELYN KRUPINSKI [id number] 01/08/1976 48 y.o.  Subjective:   Patient ID:  Gwendolyn Carrillo is a 48 y.o. (DOB 1976-03-17) female.  Chief Complaint: No chief complaint on file.   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 She also has a history of generalized anxiety disorder, OCD, and panic.   Describes mood today as better. She reports receiving Spravato treatment today and feels it was helpful. She denies any untoward side effects - mostly sleeping through treatment today. Mood symptoms - she reports decreased depression and anxiety after her treatment today. Reports she is feeling relief from mood symptoms.        ECT-MADRS    Flowsheet Row Video Visit from 01/05/2024 in [name]  MADRS Total Score 31   GAD-7    Flowsheet Row Office Visit from 08/31/2022 in [facility] Office Visit from 03/03/2022 in [facility] Office Visit from 11/25/2021 in [facility] Office Visit from 08/12/2021 in [facility] Office Visit from 04/01/2021 in [facility]  Total GAD-7 Score 12 14 5 8 16    PHQ2-9    Flowsheet Row Video Visit from 01/05/2024 in [name] Office Visit from 08/31/2022 in [facility] Office Visit from 03/03/2022 in [facility] Office Visit from 02/10/2022 in [facility] Office Visit from 11/25/2021 in [facility]  PHQ-2 Total Score 4 4 6 5 2   PHQ-9 Total Score -- 17 14 13 8    Flowsheet Row ED from 05/26/2023 in [facility] Emergency Department at [facility] ED from 12/11/2022 in [facility] Emergency Department at [facility] ED from 05/05/2022 in [facility] Emergency Department at [facility]  C-SSRS RISK CATEGORY No Risk No Risk No Risk     Review of Systems:  Review of Systems  Musculoskeletal:  Negative for gait problem.  Neurological:  Negative for tremors.  Psychiatric/Behavioral:         Please refer to HPI    Medications: {medication reviewed/display:3041432}  Current Outpatient Medications  Medication Sig Dispense Refill   ALPRAZolam  (XANAX ) 1 MG tablet Take 1 tablet (1 mg total) by mouth 2 (two) times daily as needed for anxiety. 60 tablet 0   atorvastatin  (LIPITOR) 80 MG tablet Take 1 tablet (80 mg total) by mouth daily. 90 tablet 1   Blood Glucose Monitoring Suppl (ONETOUCH VERIO) w/Device KIT USE TO CHECK BLOOD SUGAR THREE TIMES DAILY. 1 kit 0   Cholecalciferol (VITAMIN D3) 50 MCG (2000 UT) capsule Take 1 capsule (2,000 Units total) by mouth daily. (Patient not taking: Reported on 02/02/2024) 90 capsule 1   clindamycin  (CLEOCIN ) 2 % vaginal cream Place 1 Applicatorful vaginally at bedtime. 40 g 0   Continuous Blood Gluc Receiver (DEXCOM G6 RECEIVER) DEVI 1 Bag by Does not apply route in the morning, at noon, and at bedtime. 1 each 0   Continuous Glucose Sensor (DEXCOM G6 SENSOR) MISC USE AS DIRECTED EVERY 10 DAYS 9 each 2   Continuous Glucose Transmitter (DEXCOM G6 TRANSMITTER) MISC USE AS DIRECTED OVER 90 DAYS 1 each 2   Continuous Glucose Transmitter (DEXCOM G6 TRANSMITTER) MISC USE AS DIRECTED OVER 90 DAYS     cyclobenzaprine  (FLEXERIL ) 10 MG tablet Take 1 tablet (10 mg total) by mouth 2 (two) times daily as  needed for muscle spasms. 12 tablet 0   Elastic Bandages & Supports (ELBOW COMPRESSION) MISC 1 Application by Does not apply route daily. 1 each 0   FLUoxetine  (PROZAC ) 10 MG capsule Take 1 capsule (10 mg total) by mouth daily. 30 capsule 2   glucose blood (ONETOUCH VERIO) test strip USE TO CHECK BLOOD SUGAR THREE TIMES DAILY. 100 each 2   hydrochlorothiazide  (HYDRODIURIL ) 25 MG tablet Take 1 tablet (25 mg total) by mouth daily. 90 tablet 1    hydrOXYzine  (ATARAX ) 25 MG tablet Take 1 tablet (25 mg total) by mouth 3 (three) times daily as needed. 90 tablet 1   Insulin  Glargine (BASAGLAR  KWIKPEN) 100 UNIT/ML Inject 15 Units into the skin daily. 15 mL 0   insulin  lispro (HUMALOG  KWIKPEN) 100 UNIT/ML KwikPen INJECT up to 12u INTO THE SKIN 3 TIMES DAILY WITH MEALS per sliding scale: 200-250: 4 UNITS, 251-300: 6 UNITS, 301-350: 8 UNITS, 351-400: 10 UNITS,>400:12 UNITS AND CALL NP. Max daily dose: 36u 30 mL 0   Insulin  Pen Needle (PEN NEEDLES) 32G X 5 MM MISC Use to inject sliding scale Humalog  three times daily. 100 each 6   JARDIANCE  10 MG TABS tablet Take 10 mg by mouth daily.     lidocaine  (LIDODERM ) 5 % Place 1 patch onto the skin daily. Remove & Discard patch within 12 hours or as directed by MD 30 patch 0   LINZESS  145 MCG CAPS capsule Take 1 capsule (145 mcg total) by mouth daily as needed (constipation). 90 capsule 1   lisinopril  (ZESTRIL ) 20 MG tablet Take 1 tablet (20 mg total) by mouth daily. 90 tablet 1   metFORMIN (GLUCOPHAGE) 500 MG tablet Take 500 mg by mouth daily with breakfast.     metoCLOPramide  (REGLAN ) 10 MG tablet Take 10 mg by mouth every 6 (six) hours as needed.     metoprolol  tartrate (LOPRESSOR ) 25 MG tablet TAKE 1 TABLET BY MOUTH TWICE A DAY 60 tablet 0   norethindrone  (MICRONOR ) 0.35 MG tablet Take 1 tablet (0.35 mg total) by mouth daily. 28 tablet 0   omeprazole  (PRILOSEC) 20 MG capsule TAKE 1 CAPSULE BY MOUTH DAILY IF NEEDED FOR GASTRIC REFLUX 90 capsule 0   ondansetron  (ZOFRAN -ODT) 4 MG disintegrating tablet TAKE 1 TABLET BY MOUTH EVERY 8 HOURS AS NEEDED FOR NAUSEA AND VOMITING 12 tablet 0   OneTouch Delica Lancets 33G MISC USE TO CHECK BLOOD SUGAR THREE TIMES DAILY. 100 each 2   telmisartan (MICARDIS) 80 MG tablet Take 1 tablet by mouth daily.     No current facility-administered medications for this visit.    Medication Side Effects: {Medication Side Effects (Optional):21014029}  Allergies: Not on  File  Past Medical History:  Diagnosis Date   Anxiety    Diabetes mellitus without complication (HCC)    Gastroparesis    Hypertension    Major depressive disorder     Past Medical History, Surgical history, Social history, and Family history were reviewed and updated as appropriate.   Please see review of systems for further details on the patient's review from today.   Objective:   Physical Exam:  There were no vitals taken for this visit.  Physical Exam Constitutional:      General: She is not in acute distress. Musculoskeletal:        General: No deformity.  Neurological:     Mental Status: She is alert and oriented to person, place, and time.     Coordination: Coordination normal.  Psychiatric:  Attention and Perception: Attention and perception normal. She does not perceive auditory or visual hallucinations.        Mood and Affect: Mood normal. Mood is not anxious or depressed. Affect is not labile, blunt, angry or inappropriate.        Speech: Speech normal.        Behavior: Behavior normal.        Thought Content: Thought content normal. Thought content is not paranoid or delusional. Thought content does not include homicidal or suicidal ideation. Thought content does not include homicidal or suicidal plan.        Cognition and Memory: Cognition and memory normal.        Judgment: Judgment normal.     Comments: Insight intact     Lab Review:     Component Value Date/Time   NA 131 (L) 05/25/2023 2132   NA 141 08/31/2022 0954   K 3.5 05/25/2023 2132   CL 94 (L) 05/25/2023 2132   CO2 25 05/25/2023 2132   GLUCOSE 314 (H) 05/25/2023 2132   BUN 10 05/25/2023 2132   BUN 12 08/31/2022 0954   CREATININE 0.87 05/25/2023 2132   CALCIUM  9.5 05/25/2023 2132   PROT 8.4 (H) 05/25/2023 2132   PROT 7.6 08/31/2022 0954   ALBUMIN 4.4 05/25/2023 2132   ALBUMIN 4.3 08/31/2022 0954   AST 71 (H) 05/25/2023 2132   ALT 86 (H) 05/25/2023 2132   ALKPHOS 70 05/25/2023  2132   BILITOT 1.0 05/25/2023 2132   BILITOT 0.3 08/31/2022 0954   GFRNONAA >60 05/25/2023 2132   GFRAA >60 10/10/2019 1441       Component Value Date/Time   WBC 11.8 (H) 05/25/2023 2132   RBC 4.51 05/25/2023 2132   HGB 15.1 (H) 05/25/2023 2132   HGB 13.9 08/31/2022 0954   HCT 42.4 05/25/2023 2132   HCT 41.0 08/31/2022 0954   PLT 187 05/25/2023 2132   PLT 249 08/31/2022 0954   MCV 94.0 05/25/2023 2132   MCV 96 08/31/2022 0954   MCH 33.5 05/25/2023 2132   MCHC 35.6 05/25/2023 2132   RDW 12.5 05/25/2023 2132   RDW 12.6 08/31/2022 0954   LYMPHSABS 0.5 (L) 05/25/2023 2132   LYMPHSABS 2.3 08/31/2022 0954   MONOABS 0.5 05/25/2023 2132   EOSABS 0.2 05/25/2023 2132   EOSABS 0.3 08/31/2022 0954   BASOSABS 0.0 05/25/2023 2132   BASOSABS 0.1 08/31/2022 0954    No results found for: POCLITH, LITHIUM   No results found for: PHENYTOIN, PHENOBARB, VALPROATE, CBMZ   .res Assessment: Plan:     Patient was administered Spravato 84 mg intranasally today - her first dose. Patient was observed by provider throughout Spravato treatment. The patient experienced the typical dissociation which gradually resolved over the 2-hour period of observation. There were no complications. Specifically , the patient did not have  any untoward side effects - feeling disconnected from herself, her thoughts, feelings and things around her, dizziness, nausea, feeling sleepy, decreased feeling of sensitivity (numbness)spinning sensation, feeling anxious, lack of energy, increased blood pressure, nausea, vomiting, feeling drink, or feeling happy or very excited.nausea or vomiting or headache. Blood pressures remained within normal ranges at the 40-minute and 2-hour follow-up intervals. By the time the 2-hour observation period was met the patient was alert and oriented and able to exit without assistance. Patient feels the Spravato administration is helpful for the treatment resistant depression and would  like to continue the treatment. See nursing note for further details.    No  meds were changed but BP was elevated prior to administration.  See the nursing note for those details.  She was given clonidine 0.1 mg tablet which she tolerated well and it did help bring down her blood pressure so we were able to pursue Spravato administration today.  She was instructed to check her blood pressure prior to the next visit.  She is compliant with her outpatient blood pressure medicines.  Will pursue 56 mg Spravato at the next visit because she was so drowsy today.  We anticipate going up to 84 mg after that.  Follow-up twice weekly for Spravato administration for 4 weeks at least.  There are no diagnoses linked to this encounter.   Please see After Visit Summary for patient specific instructions.  Future Appointments  Date Time Provider Department Center  02/09/2024  9:30 AM CP-NURSE [facility] None  02/09/2024  9:30 AM Chesni Vos, Angeline Mattocks, NP [facility] None  02/14/2024  9:30 AM Cottle, Lorene KANDICE Raddle., MD [facility] None  02/14/2024  9:30 AM CP-NURSE [facility] None  02/16/2024  9:30 AM Cottle, Lorene KANDICE Raddle., MD [facility] None  02/16/2024  9:30 AM CP-NURSE [facility] None  02/21/2024  9:30 AM Cottle, Lorene KANDICE Raddle., MD [facility] None  02/21/2024  9:30 AM CP-NURSE [facility] None  02/23/2024  9:30 AM Cottle, Lorene KANDICE Raddle., MD [facility] None  02/23/2024  9:30 AM CP-NURSE [facility] None  02/23/2024 12:00 PM Prentiss Annabella LABOR, NP [facility] None  04/05/2024 11:00 AM Rasul Decola Nattalie, NP [facility] None    No orders of the defined types were placed in this encounter.   -------------------------------

## 2024-02-07 NOTE — Progress Notes (Signed)
 NURSES NOTE:         Pt arrived for her 3rd Spravato Treatment for treatment resistant depression, pt will be getting 84 mg (3 of the 28 mg inhalers), this will be her first dose at that strength. Pt sees [employer] and she will follow up with her for office visits and see her at treatment when she is in the office. Explained to pt how the treatments would be scheduled and answered any questions she had today. Pt's Spravato is billed through buy and bill medically.  Spravato medication is stored at treatment center per REMS/FDA guidelines. The medication is required to be locked behind two doors per REMS/FDA protocol. Medication is also disposed of properly after each use per regulations. All documentation for REMS is completed and submitted per FDA/REMS requirements.          Began taking patient's vital signs at 9:35 AM 119/76, pulse 92. SpO2 Sat. 98%. Vital Signs stable proceed to administer 84 mg. Explained any further questions or concerns before getting started. Instructed patient to blow her nose if needed then recline back to a 45 degree angle. Gave patient first dose 28 mg nasal spray, administered in each nostril as directed and observed by nurse, waited 5 more minutes for the second and third doses. After all doses given pt did not complain of any nausea/vomiting, pt did have a drink to help with the taste of Spravato it gives the metallic taste. Assessed her 40 minute vitals, 10:20 PM, 137/79, pulse 84, SpO2 97%.  Explained she would be monitored for a total time of 120 minutes. Discharge vitals were taken at 11:20 AM 108/69, P 83, SpO2 98%. Angeline is in office this week and able to oversee her treatments, she came to visit with patient once her thoughts were clearer to discuss how treatment went. Pt was still pretty out of it when leaving but alert enough to contact her Dad to come get here. Able to ambulate. Recommend she go home and sleep or just relax on the couch. No driving, no  intense activities. Verbalized understanding. Pt. will be receiving 2 treatments per week for 4 weeks as recommended. Nurse was with pt a total of 60 minutes for clinical assessment.  Instructed to call with any issues prior to next apt on Thursday. Pt will get 84 mg.  (84 mg)  LOT [id number] EXP APR 2028

## 2024-02-08 ENCOUNTER — Encounter: Payer: Self-pay | Admitting: Adult Health

## 2024-02-09 ENCOUNTER — Ambulatory Visit

## 2024-02-09 ENCOUNTER — Ambulatory Visit: Admitting: Adult Health

## 2024-02-09 VITALS — BP 101/63 | HR 81

## 2024-02-09 DIAGNOSIS — F339 Major depressive disorder, recurrent, unspecified: Secondary | ICD-10-CM | POA: Diagnosis not present

## 2024-02-09 NOTE — Progress Notes (Signed)
 NURSES NOTE:         Pt arrived for her 4th Spravato Treatment for treatment resistant depression, pt will be getting 84 mg (3 of the 28 mg inhalers), this will be her first dose at that strength. Pt sees [employer] and she will follow up with her for office visits and see her at treatment when she is in the office. Explained to pt how the treatments would be scheduled and answered any questions she had today. Pt's Spravato is billed through buy and bill medically.  Spravato medication is stored at treatment center per REMS/FDA guidelines. The medication is required to be locked behind two doors per REMS/FDA protocol. Medication is also disposed of properly after each use per regulations. All documentation for REMS is completed and submitted per FDA/REMS requirements.          Began taking patient's vital signs at 9:25 AM 122/80, pulse 81. SpO2 Sat. 96%. Vital Signs stable proceed to administer 84 mg. Explained any further questions or concerns before getting started. Instructed patient to blow her nose if needed then recline back to a 45 degree angle. Gave patient first dose 28 mg nasal spray, administered in each nostril as directed and observed by nurse, waited 5 more minutes for the second and third doses. After all doses given pt did not complain of any nausea/vomiting, pt did have a drink to help with the taste of Spravato it gives the metallic taste. Assessed her 40 minute vitals, 10:15 AM, 127/79, pulse 77, SpO2 97%.  Explained she would be monitored for a total time of 120 minutes. Discharge vitals were taken at 11:32 AM 101/63, P 81, SpO2 97%. Angeline is in office this week and able to oversee her treatments, she came to visit with patient once her thoughts were clearer to discuss how treatment went. Pt was still pretty out of it when leaving but alert enough to contact her Dad to come get here. Able to ambulate. Recommend she go home and sleep or just relax on the couch. No driving, no  intense activities. Verbalized understanding. Pt. will be receiving 2 treatments per week for 4 weeks as recommended. Nurse was with pt a total of 60 minutes for clinical assessment.  Instructed to call with any issues prior to next apt on Tuesday. Pt will get 84 mg.   (84 mg)  LOT [id number] EXP JUN 2028

## 2024-02-10 ENCOUNTER — Encounter: Payer: Self-pay | Admitting: Psychiatry

## 2024-02-10 ENCOUNTER — Encounter: Payer: Self-pay | Admitting: Adult Health

## 2024-02-10 NOTE — Progress Notes (Signed)
 [name] [id number] 12/17/1975 48 y.o.  Subjective:   Patient ID:  Gwendolyn Carrillo is a 48 y.o. (DOB 08-21-1975) female.  Chief Complaint: No chief complaint on file.   HPI Gwendolyn Carrillo presents to the office today for follow-up of  TRD - treatment resistant major depression.   She also has a history of generalized anxiety disorder, OCD, and panic.   Abbigaile reports a positive experience with Spravato administration today. Describes mood today as improved. Stating I am feeling better. She is tolerating the increased dose of 84mg .  She denied any untoward side effects today - mostly sleeping through treatment. Mood symptoms - she reports decreased depression and anxiety after treatment today. She reports she is feeling an overall relief from depressive symptoms. She denied any irritability/agitation. Reports appetite adequate - weight stable. Reports sleeping well - better recently. Reports improved focus and concentration. Denies SI or HI.   Will plan on continuation of Spravato at 84mg  at next treatment.    ECT-MADRS    Flowsheet Row Video Visit from 01/05/2024 in [name]  MADRS Total Score 31   GAD-7    Flowsheet Row Office Visit from 08/31/2022 in [facility] Office Visit from 03/03/2022 in [facility] Office Visit from 11/25/2021 in [facility] Office Visit from 08/12/2021 in [facility] Office Visit from 04/01/2021 in [facility]  Total GAD-7 Score 12 14 5 8 16    PHQ2-9    Flowsheet Row Video Visit from 01/05/2024 in [name] Office Visit from 08/31/2022 in [facility] Office Visit from 03/03/2022 in [facility] Office Visit from 02/10/2022 in [facility] [place] Office Visit from 11/25/2021 in  [facility]  PHQ-2 Total Score 4 4 6 5 2   PHQ-9 Total Score -- 17 14 13 8    Flowsheet Row ED from 05/26/2023 in [facility] Emergency Department at [facility] ED from 12/11/2022 in [facility] Emergency Department at [facility] ED from 05/05/2022 in [facility] Department at [facility]  C-SSRS RISK CATEGORY No Risk No Risk No Risk     Review of Systems:  Review of Systems  Musculoskeletal:  Negative for gait problem.  Neurological:  Negative for tremors.  Psychiatric/Behavioral:         Please refer to HPI    Medications: I have reviewed the patient's current medications.  Current Outpatient Medications  Medication Sig Dispense Refill   ALPRAZolam  (XANAX ) 1 MG tablet Take 1 tablet (1 mg total) by mouth 2 (two) times daily as needed for anxiety. 60 tablet 0   atorvastatin  (LIPITOR) 80 MG tablet Take 1 tablet (80 mg total) by mouth daily. 90 tablet 1   Blood Glucose Monitoring Suppl (ONETOUCH VERIO) w/Device KIT USE TO CHECK BLOOD SUGAR THREE TIMES DAILY. 1 kit 0   Cholecalciferol (VITAMIN D3) 50 MCG (2000 UT) capsule Take 1 capsule (2,000 Units total) by mouth daily. (Patient not taking: Reported on 02/02/2024) 90 capsule 1   clindamycin  (CLEOCIN ) 2 % vaginal cream Place 1 Applicatorful vaginally at bedtime. 40 g 0   Continuous Blood Gluc Receiver (DEXCOM G6 RECEIVER) DEVI 1 Bag by Does not apply route in the morning, at noon, and at bedtime. 1 each 0   Continuous Glucose Sensor (DEXCOM G6 SENSOR) MISC USE AS DIRECTED EVERY 10 DAYS 9 each  2   Continuous Glucose Transmitter (DEXCOM G6 TRANSMITTER) MISC USE AS DIRECTED OVER 90 DAYS 1 each 2   Continuous Glucose Transmitter (DEXCOM G6 TRANSMITTER) MISC USE AS DIRECTED OVER 90 DAYS     cyclobenzaprine  (FLEXERIL ) 10 MG tablet Take 1 tablet (10 mg total) by mouth 2 (two) times daily as needed for muscle spasms. 12 tablet 0   Elastic Bandages & Supports (ELBOW COMPRESSION) MISC 1  Application by Does not apply route daily. 1 each 0   FLUoxetine  (PROZAC ) 10 MG capsule Take 1 capsule (10 mg total) by mouth daily. 30 capsule 2   glucose blood (ONETOUCH VERIO) test strip USE TO CHECK BLOOD SUGAR THREE TIMES DAILY. 100 each 2   hydrochlorothiazide  (HYDRODIURIL ) 25 MG tablet Take 1 tablet (25 mg total) by mouth daily. 90 tablet 1   hydrOXYzine  (ATARAX ) 25 MG tablet Take 1 tablet (25 mg total) by mouth 3 (three) times daily as needed. 90 tablet 1   Insulin  Glargine (BASAGLAR  KWIKPEN) 100 UNIT/ML Inject 15 Units into the skin daily. 15 mL 0   insulin  lispro (HUMALOG  KWIKPEN) 100 UNIT/ML KwikPen INJECT up to 12u INTO THE SKIN 3 TIMES DAILY WITH MEALS per sliding scale: 200-250: 4 UNITS, 251-300: 6 UNITS, 301-350: 8 UNITS, 351-400: 10 UNITS,>400:12 UNITS AND CALL NP. Max daily dose: 36u 30 mL 0   Insulin  Pen Needle (PEN NEEDLES) 32G X 5 MM MISC Use to inject sliding scale Humalog  three times daily. 100 each 6   JARDIANCE  10 MG TABS tablet Take 10 mg by mouth daily.     lidocaine  (LIDODERM ) 5 % Place 1 patch onto the skin daily. Remove & Discard patch within 12 hours or as directed by MD 30 patch 0   LINZESS  145 MCG CAPS capsule Take 1 capsule (145 mcg total) by mouth daily as needed (constipation). 90 capsule 1   lisinopril  (ZESTRIL ) 20 MG tablet Take 1 tablet (20 mg total) by mouth daily. 90 tablet 1   metFORMIN (GLUCOPHAGE) 500 MG tablet Take 500 mg by mouth daily with breakfast.     metoCLOPramide  (REGLAN ) 10 MG tablet Take 10 mg by mouth every 6 (six) hours as needed.     metoprolol  tartrate (LOPRESSOR ) 25 MG tablet TAKE 1 TABLET BY MOUTH TWICE A DAY 60 tablet 0   norethindrone  (MICRONOR ) 0.35 MG tablet Take 1 tablet (0.35 mg total) by mouth daily. 28 tablet 0   omeprazole  (PRILOSEC) 20 MG capsule TAKE 1 CAPSULE BY MOUTH DAILY IF NEEDED FOR GASTRIC REFLUX 90 capsule 0   ondansetron  (ZOFRAN -ODT) 4 MG disintegrating tablet TAKE 1 TABLET BY MOUTH EVERY 8 HOURS AS NEEDED FOR NAUSEA AND  VOMITING 12 tablet 0   OneTouch Delica Lancets 33G MISC USE TO CHECK BLOOD SUGAR THREE TIMES DAILY. 100 each 2   telmisartan (MICARDIS) 80 MG tablet Take 1 tablet by mouth daily.     No current facility-administered medications for this visit.    Medication Side Effects: None  Allergies: Not on File  Past Medical History:  Diagnosis Date   Anxiety    Diabetes mellitus without complication (HCC)    Gastroparesis    Hypertension    Major depressive disorder     Past Medical History, Surgical history, Social history, and Family history were reviewed and updated as appropriate.   Please see review of systems for further details on the patient's review from today.   Objective:   Physical Exam:  There were no vitals taken for this visit.  Physical  Exam Constitutional:      General: She is not in acute distress. Musculoskeletal:        General: No deformity.  Neurological:     Mental Status: She is alert and oriented to person, place, and time.     Coordination: Coordination normal.  Psychiatric:        Attention and Perception: Attention and perception normal. She does not perceive auditory or visual hallucinations.        Mood and Affect: Mood normal. Mood is not anxious or depressed. Affect is not labile, blunt, angry or inappropriate.        Speech: Speech normal.        Behavior: Behavior normal.        Thought Content: Thought content normal. Thought content is not paranoid or delusional. Thought content does not include homicidal or suicidal ideation. Thought content does not include homicidal or suicidal plan.        Cognition and Memory: Cognition and memory normal.        Judgment: Judgment normal.     Comments: Insight intact     Lab Review:     Component Value Date/Time   NA 131 (L) 05/25/2023 2132   NA 141 08/31/2022 0954   K 3.5 05/25/2023 2132   CL 94 (L) 05/25/2023 2132   CO2 25 05/25/2023 2132   GLUCOSE 314 (H) 05/25/2023 2132   BUN 10 05/25/2023  2132   BUN 12 08/31/2022 0954   CREATININE 0.87 05/25/2023 2132   CALCIUM  9.5 05/25/2023 2132   PROT 8.4 (H) 05/25/2023 2132   PROT 7.6 08/31/2022 0954   ALBUMIN 4.4 05/25/2023 2132   ALBUMIN 4.3 08/31/2022 0954   AST 71 (H) 05/25/2023 2132   ALT 86 (H) 05/25/2023 2132   ALKPHOS 70 05/25/2023 2132   BILITOT 1.0 05/25/2023 2132   BILITOT 0.3 08/31/2022 0954   GFRNONAA >60 05/25/2023 2132   GFRAA >60 10/10/2019 1441       Component Value Date/Time   WBC 11.8 (H) 05/25/2023 2132   RBC 4.51 05/25/2023 2132   HGB 15.1 (H) 05/25/2023 2132   HGB 13.9 08/31/2022 0954   HCT 42.4 05/25/2023 2132   HCT 41.0 08/31/2022 0954   PLT 187 05/25/2023 2132   PLT 249 08/31/2022 0954   MCV 94.0 05/25/2023 2132   MCV 96 08/31/2022 0954   MCH 33.5 05/25/2023 2132   MCHC 35.6 05/25/2023 2132   RDW 12.5 05/25/2023 2132   RDW 12.6 08/31/2022 0954   LYMPHSABS 0.5 (L) 05/25/2023 2132   LYMPHSABS 2.3 08/31/2022 0954   MONOABS 0.5 05/25/2023 2132   EOSABS 0.2 05/25/2023 2132   EOSABS 0.3 08/31/2022 0954   BASOSABS 0.0 05/25/2023 2132   BASOSABS 0.1 08/31/2022 0954    No results found for: POCLITH, LITHIUM   No results found for: PHENYTOIN, PHENOBARB, VALPROATE, CBMZ   .res Assessment: Plan:    Patient was administered her first dose of Spravato 84 mg intranasally today. Patient was observed by provider throughout Spravato treatment. The patient experienced the typical dissociation which gradually resolved over the 2-hour period of observation. There were no complications. Specifically , the patient did not have any untoward side effects - feeling disconnected from herself, her thoughts, feelings and things around her, dizziness, nausea, feeling sleepy, decreased feeling of sensitivity (numbness)spinning sensation, feeling anxious, lack of energy, increased blood pressure, nausea, vomiting, feeling drink, or feeling happy or very excited.nausea or vomiting or headache. Blood pressures  remained within normal ranges  at the 40-minute and 2-hour follow-up intervals. By the time the 2-hour observation period was met the patient was alert and oriented and able to exit without assistance.  Patient feels like the Spravato administration is helping with her treatment resistant depression and would like to continue treatment. See nursing note for further details.    No meds were changed today - her BP was controlled prior to and during the Spravato administration. See the nursing note for those details. She is compliant with her outpatient blood pressure medicines.  Will continue 84 mg Spravato for upcoming treatments.     Follow-up twice weekly for Spravato administration for 4 weeks at least.  There are no diagnoses linked to this encounter.   Please see After Visit Summary for patient specific instructions.  Future Appointments  Date Time Provider Department Center  02/14/2024  9:30 AM Cottle, Lorene KANDICE Raddle., MD CP-[facility] None  02/14/2024  9:30 AM CP-NURSE CP-[facility] None  02/16/2024  9:30 AM Cottle, Lorene KANDICE Raddle., MD CP-[facility] None  02/16/2024  9:30 AM CP-NURSE CP-[facility] None  02/21/2024  9:30 AM Cottle, Lorene KANDICE Raddle., MD CP-CP None  02/21/2024  9:30 AM CP-NURSE CP-CP None  02/23/2024  9:30 AM Cottle, Lorene KANDICE Raddle., MD CP-CP None  02/23/2024  9:30 AM CP-NURSE CP-CP None  04/05/2024 11:00 AM Tamarius Rosenfield Nattalie, NP CP-CP None    No orders of the defined types were placed in this encounter.   -------------------------------

## 2024-02-14 ENCOUNTER — Ambulatory Visit

## 2024-02-14 ENCOUNTER — Ambulatory Visit (INDEPENDENT_AMBULATORY_CARE_PROVIDER_SITE_OTHER): Admitting: Psychiatry

## 2024-02-14 VITALS — BP 120/75 | HR 87

## 2024-02-14 DIAGNOSIS — F411 Generalized anxiety disorder: Secondary | ICD-10-CM

## 2024-02-14 DIAGNOSIS — F339 Major depressive disorder, recurrent, unspecified: Secondary | ICD-10-CM

## 2024-02-14 DIAGNOSIS — G4700 Insomnia, unspecified: Secondary | ICD-10-CM

## 2024-02-14 DIAGNOSIS — F422 Mixed obsessional thoughts and acts: Secondary | ICD-10-CM

## 2024-02-14 DIAGNOSIS — F410 Panic disorder [episodic paroxysmal anxiety] without agoraphobia: Secondary | ICD-10-CM

## 2024-02-14 NOTE — Progress Notes (Unsigned)
 SHLEY Carrillo [id number] [birth year] 48 y.o.  Subjective:   Patient ID:  Gwendolyn Carrillo is a 48 y.o. (DOB [birth year]) female.  Chief Complaint:  No chief complaint on file.   HPI Gwendolyn Carrillo presents to the office today for follow-up of treatment resistant major depression.  She also has a history of generalized anxiety disorder, OCD, and panic.  She describes a long history of depression and anxiety and has been unable to tolerate most antidepressants.  She is very eager to pursue Spravato for relief from her depression.    01/31/24 appt noted:  Her PHQ-9 score was 22 today.  She describes symptoms of anhedonia, low motivation, difficulty concentrating, poor productivity, difficulty being around people and wanting to isolate.  She is not suicidal but she has had hopelessness other than this opportunity to pursue Spravato.  She does not feel like she can function at work and is discussing leave of absence with her usual provider [employer], PNP. She was so excited to start treatment today that she had trouble sleeping last night. She received her first administration of Spravato at 56 mg today.  She had no untoward side effects such as nausea dizziness headache or vomiting.  She did have the expected dissociation but it was not scary.  In fact she experienced a relief from negative emotions such as depression and anxiety while under treatment. She was very drowsy and fell asleep.  She explained it as being due to being sleep deprived.  She is eager to continue the Spravato.  02/02/24 appt noted:  She received her second administration of Spravato at 56 mg today.  She had no untoward side effects such as nausea dizziness headache or vomiting.  She did have the expected dissociation but it was not scary.  In fact she experienced a relief from negative emotions such as depression and anxiety while under treatment. She was much less drowsy with it.  Would like to increase dose as  planned.  No SI.  Tolerating current tx plan.   02/14/24 appt noted: 2 Spravato 84 mg tx last week as planned Here for Spravato 84 mg today.  Expected dissociation well tolerated without NV, HA.  Some dizziness.  Good emotional relief from negativity immediately from Spravato.    ECT-MADRS    Flowsheet Row Video Visit from 01/05/2024 in [name]  MADRS Total Score 31   GAD-7    Flowsheet Row Office Visit from 08/31/2022 in [facility] Office Visit from 03/03/2022 in [facility] Office Visit from 11/25/2021 in [facility] Office Visit from 08/12/2021 in [facility] Office Visit from 04/01/2021 in [facility]  Total GAD-7 Score 12 14 5 8 16    PHQ2-9    Flowsheet Row Video Visit from 01/05/2024 in [name] Office Visit from 08/31/2022 in [facility] Office Visit from 03/03/2022 in [facility] Office Visit from 02/10/2022 in [facility] [place] Office Visit from 11/25/2021 in [facility]  PHQ-2 Total Score 4 4 6 5 2   PHQ-9 Total Score -- 17 14 13 8    Flowsheet Row ED from 05/26/2023 in [facility] Emergency Department at [facility] ED from 12/11/2022 in [facility] Emergency Department at [facility] ED from 05/05/2022 in [facility] Emergency Department at [facility]  C-SSRS RISK CATEGORY No Risk  No Risk No Risk     Review of Systems:  Review of Systems  Cardiovascular:  Negative for chest pain and palpitations.  Gastrointestinal:  Negative for nausea and vomiting.  Neurological:  Negative for dizziness, weakness and headaches.  Psychiatric/Behavioral:  Positive for decreased concentration, dysphoric mood and sleep disturbance. The patient is nervous/anxious.      Medications: I have reviewed the patient's current medications.  Current Outpatient Medications  Medication Sig Dispense Refill  . ALPRAZolam  (XANAX ) 1 MG tablet Take 1 tablet (1 mg total) by mouth 2 (two) times daily as needed for anxiety. 60 tablet 0  . atorvastatin  (LIPITOR) 80 MG tablet Take 1 tablet (80 mg total) by mouth daily. 90 tablet 1  . Blood Glucose Monitoring Suppl (ONETOUCH VERIO) w/Device KIT USE TO CHECK BLOOD SUGAR THREE TIMES DAILY. 1 kit 0  . Cholecalciferol (VITAMIN D3) 50 MCG (2000 UT) capsule Take 1 capsule (2,000 Units total) by mouth daily. (Patient not taking: Reported on 02/02/2024) 90 capsule 1  . clindamycin  (CLEOCIN ) 2 % vaginal cream Place 1 Applicatorful vaginally at bedtime. 40 g 0  . Continuous Blood Gluc Receiver (DEXCOM G6 RECEIVER) DEVI 1 Bag by Does not apply route in the morning, at noon, and at bedtime. 1 each 0  . Continuous Glucose Sensor (DEXCOM G6 SENSOR) MISC USE AS DIRECTED EVERY 10 DAYS 9 each 2  . Continuous Glucose Transmitter (DEXCOM G6 TRANSMITTER) MISC USE AS DIRECTED OVER 90 DAYS 1 each 2  . Continuous Glucose Transmitter (DEXCOM G6 TRANSMITTER) MISC USE AS DIRECTED OVER 90 DAYS    . cyclobenzaprine  (FLEXERIL ) 10 MG tablet Take 1 tablet (10 mg total) by mouth 2 (two) times daily as needed for muscle spasms. 12 tablet 0  . Elastic Bandages & Supports (ELBOW COMPRESSION) MISC 1 Application by Does not apply route daily. 1 each 0  . FLUoxetine  (PROZAC ) 10 MG capsule Take 1 capsule (10 mg total) by mouth daily. (Patient not taking: Reported on 02/10/2024) 30 capsule 2  . glucose blood (ONETOUCH VERIO) test strip USE TO CHECK BLOOD SUGAR THREE TIMES DAILY. 100 each 2  . hydrochlorothiazide  (HYDRODIURIL ) 25 MG tablet Take 1 tablet (25 mg total) by mouth daily. 90 tablet 1  . hydrOXYzine  (ATARAX ) 25 MG tablet Take 1 tablet (25 mg total) by mouth 3 (three) times daily as needed. 90 tablet 1  . Insulin  Glargine (BASAGLAR  KWIKPEN) 100 UNIT/ML Inject  15 Units into the skin daily. 15 mL 0  . insulin  lispro (HUMALOG  KWIKPEN) 100 UNIT/ML KwikPen INJECT up to 12u INTO THE SKIN 3 TIMES DAILY WITH MEALS per sliding scale: 200-250: 4 UNITS, 251-300: 6 UNITS, 301-350: 8 UNITS, 351-400: 10 UNITS,>400:12 UNITS AND CALL NP. Max daily dose: 36u 30 mL 0  . Insulin  Pen Needle (PEN NEEDLES) 32G X 5 MM MISC Use to inject sliding scale Humalog  three times daily. 100 each 6  . JARDIANCE  10 MG TABS tablet Take 10 mg by mouth daily.    . lidocaine  (LIDODERM ) 5 % Place 1 patch onto the skin daily. Remove & Discard patch within 12 hours or as directed by MD 30 patch 0  . LINZESS  145 MCG CAPS capsule Take 1 capsule (145 mcg total) by mouth daily as needed (constipation). 90 capsule 1  . lisinopril  (ZESTRIL ) 20 MG tablet Take 1 tablet (20 mg total) by mouth daily. 90 tablet 1  . metFORMIN (GLUCOPHAGE) 500 MG tablet Take 500 mg by mouth daily with breakfast.    . metoCLOPramide  (REGLAN )  10 MG tablet Take 10 mg by mouth every 6 (six) hours as needed.    . metoprolol  tartrate (LOPRESSOR ) 25 MG tablet TAKE 1 TABLET BY MOUTH TWICE A DAY 60 tablet 0  . norethindrone  (MICRONOR ) 0.35 MG tablet Take 1 tablet (0.35 mg total) by mouth daily. 28 tablet 0  . omeprazole  (PRILOSEC) 20 MG capsule TAKE 1 CAPSULE BY MOUTH DAILY IF NEEDED FOR GASTRIC REFLUX 90 capsule 0  . ondansetron  (ZOFRAN -ODT) 4 MG disintegrating tablet TAKE 1 TABLET BY MOUTH EVERY 8 HOURS AS NEEDED FOR NAUSEA AND VOMITING 12 tablet 0  . OneTouch Delica Lancets 33G MISC USE TO CHECK BLOOD SUGAR THREE TIMES DAILY. 100 each 2  . telmisartan (MICARDIS) 80 MG tablet Take 1 tablet by mouth daily.     No current facility-administered medications for this visit.    Medication Side Effects: None  Allergies: Not on File  Past Medical History:  Diagnosis Date  . Anxiety   . Diabetes mellitus without complication (HCC)   . Gastroparesis   . Hypertension   . Major depressive disorder     Past Medical History,  Surgical history, Social history, and Family history were reviewed and updated as appropriate.   Please see review of systems for further details on the patient's review from today.   Objective:   Physical Exam:  There were no vitals taken for this visit.  Physical Exam Constitutional:      General: She is not in acute distress. Musculoskeletal:        General: No deformity.  Neurological:     Mental Status: She is alert and oriented to person, place, and time.     Coordination: Coordination normal.  Psychiatric:        Attention and Perception: Attention and perception normal. She does not perceive auditory or visual hallucinations.        Mood and Affect: Mood is anxious and depressed. Affect is not blunt, angry or inappropriate.        Speech: Speech normal.        Behavior: Behavior normal.        Thought Content: Thought content normal. Thought content is not paranoid or delusional. Thought content does not include homicidal or suicidal ideation. Thought content does not include suicidal plan.        Cognition and Memory: Cognition and memory normal.        Judgment: Judgment normal.     Comments: Insight intact     Lab Review:     Component Value Date/Time   NA 131 (L) 05/25/2023 2132   NA 141 08/31/2022 0954   K 3.5 05/25/2023 2132   CL 94 (L) 05/25/2023 2132   CO2 25 05/25/2023 2132   GLUCOSE 314 (H) 05/25/2023 2132   BUN 10 05/25/2023 2132   BUN 12 08/31/2022 0954   CREATININE 0.87 05/25/2023 2132   CALCIUM  9.5 05/25/2023 2132   PROT 8.4 (H) 05/25/2023 2132   PROT 7.6 08/31/2022 0954   ALBUMIN 4.4 05/25/2023 2132   ALBUMIN 4.3 08/31/2022 0954   AST 71 (H) 05/25/2023 2132   ALT 86 (H) 05/25/2023 2132   ALKPHOS 70 05/25/2023 2132   BILITOT 1.0 05/25/2023 2132   BILITOT 0.3 08/31/2022 0954   GFRNONAA >60 05/25/2023 2132   GFRAA >60 10/10/2019 1441       Component Value Date/Time   WBC 11.8 (H) 05/25/2023 2132   RBC 4.51 05/25/2023 2132   HGB 15.1 (H)  05/25/2023 2132  HGB 13.9 08/31/2022 0954   HCT 42.4 05/25/2023 2132   HCT 41.0 08/31/2022 0954   PLT 187 05/25/2023 2132   PLT 249 08/31/2022 0954   MCV 94.0 05/25/2023 2132   MCV 96 08/31/2022 0954   MCH 33.5 05/25/2023 2132   MCHC 35.6 05/25/2023 2132   RDW 12.5 05/25/2023 2132   RDW 12.6 08/31/2022 0954   LYMPHSABS 0.5 (L) 05/25/2023 2132   LYMPHSABS 2.3 08/31/2022 0954   MONOABS 0.5 05/25/2023 2132   EOSABS 0.2 05/25/2023 2132   EOSABS 0.3 08/31/2022 0954   BASOSABS 0.0 05/25/2023 2132   BASOSABS 0.1 08/31/2022 0954    No results found for: POCLITH, LITHIUM   No results found for: PHENYTOIN, PHENOBARB, VALPROATE, CBMZ   .res Assessment: Plan:    There are no diagnoses linked to this encounter.    Disc her TRD and the SE Spravato.  She is very excited to be receiving this bc poor tolerance AD and no sig benefit.  Patient was administered Spravato 56 mg intranasally today.  The patient experienced the typical dissociation which gradually resolved over the 2-hour period of observation.  There were no complications.  Specifically the patient did not have nausea or vomiting or headache.  Blood pressures remained within normal ranges at the 40-minute and 2-hour follow-up intervals.  By the time the 2-hour observation period was met the patient was alert and oriented and able to exit without assistance.  Patient feels the Spravato administration is helpful for the treatment resistant depression and would like to continue the treatment.  See nursing note for further details. Less drowsy from it today.  BP better today but still borderline high  Will pursue 84 mg Spravato at the next visit because she was so not drowsy today.    Follow-up twice weekly for Spravato administration for 4 weeks at least.  Lorene Macintosh MD, DFAPA  Please see After Visit Summary for patient specific instructions.  Future Appointments  Date Time Provider Department Center  02/16/2024   9:30 AM Cottle, Lorene KANDICE Raddle., MD CP-CP None  02/16/2024  9:30 AM CP-NURSE CP-CP None  02/21/2024  9:30 AM Cottle, Lorene KANDICE Raddle., MD CP-CP None  02/21/2024  9:30 AM CP-NURSE CP-CP None  02/23/2024  9:30 AM Cottle, Lorene KANDICE Raddle., MD CP-CP None  02/23/2024  9:30 AM CP-NURSE CP-CP None  04/05/2024 11:00 AM Mozingo, Regina Nattalie, NP CP-CP None    No orders of the defined types were placed in this encounter.   -------------------------------

## 2024-02-14 NOTE — Progress Notes (Signed)
 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 Pt sees [employer] and she will follow up with her for office visits and see her at treatment when she is in the office. Explained to pt how the treatments would be scheduled and answered any questions she had today. Pt's Spravato is billed through buy and bill medically.  Spravato medication is stored at treatment center per REMS/FDA guidelines. The medication is required to be locked behind two doors per REMS/FDA protocol. Medication is also disposed of properly after each use per regulations. All documentation for REMS is completed and submitted per FDA/REMS requirements.          Began taking patient's vital signs at 9:25 AM 132/91, pulse 82. SpO2 Sat. 99%. Vital Signs stable proceed to administer 84 mg. Explained any further questions or concerns before getting started. Instructed patient to blow her nose if needed then recline back to a 45 degree angle. Gave patient first dose 28 mg nasal spray, administered in each nostril as directed and observed by nurse, waited 5 more minutes for the second and third doses. After all doses given pt did not complain of any nausea/vomiting, pt did have a drink to help with the taste of Spravato it gives the metallic taste. Assessed her 40 minute vitals, 10:12 AM, 151/95, pulse 80, SpO2 99%.  Explained she would be monitored for a total time of 120 minutes. Discharge vitals were taken at 11:26 AM 120/75, P 87, SpO2 98%. Dr. Geoffry came to visit with patient once her thoughts were clearer to discuss how treatment went. Recommend she go home and sleep or just relax on the couch. No driving, no intense activities. Verbalized understanding. Pt. will be receiving 2 treatments per week for 4 weeks as recommended. Nurse was with pt a total of 60 minutes for clinical  assessment.  Instructed to call with any issues prior to next apt on Thursday. Pt will get 84 mg.   (84 mg)  LOT [id number] EXP JUN 2028

## 2024-02-15 ENCOUNTER — Encounter: Payer: Self-pay | Admitting: Psychiatry

## 2024-02-16 ENCOUNTER — Encounter: Payer: Self-pay | Admitting: Psychiatry

## 2024-02-16 ENCOUNTER — Ambulatory Visit

## 2024-02-16 ENCOUNTER — Ambulatory Visit: Admitting: Psychiatry

## 2024-02-16 VITALS — BP 126/80 | HR 76

## 2024-02-16 DIAGNOSIS — F410 Panic disorder [episodic paroxysmal anxiety] without agoraphobia: Secondary | ICD-10-CM

## 2024-02-16 DIAGNOSIS — F339 Major depressive disorder, recurrent, unspecified: Secondary | ICD-10-CM | POA: Diagnosis not present

## 2024-02-16 DIAGNOSIS — G4700 Insomnia, unspecified: Secondary | ICD-10-CM

## 2024-02-16 DIAGNOSIS — F422 Mixed obsessional thoughts and acts: Secondary | ICD-10-CM

## 2024-02-16 DIAGNOSIS — F411 Generalized anxiety disorder: Secondary | ICD-10-CM

## 2024-02-16 NOTE — Progress Notes (Signed)
 NURSES NOTE:         Pt arrived for her 6th Spravato Treatment for treatment resistant depression, pt will be getting 84 mg (3 of the 28 mg inhalers), this will be her first dose at that strength. Pt sees [employer] and she will follow up with her for office visits and see her at treatment when she is in the office. Explained to pt how the treatments would be scheduled and answered any questions she had today. Pt's Spravato is billed through buy and bill medically.  Spravato medication is stored at treatment center per REMS/FDA guidelines. The medication is required to be locked behind two doors per REMS/FDA protocol. Medication is also disposed of properly after each use per regulations. All documentation for REMS is completed and submitted per FDA/REMS requirements.          Began taking patient's vital signs at 9:25 AM 161/103, pulse 81, 165/101, 161/94. SpO2 Sat. 98%. Vital Signs not stable and Dr. Geoffry advises to give Clonidine 0.1 mg then recheck to see if stable after 45-60 minutes. Given Clonidine 0.1 mg at 9:35 AM, recheck vitals at 10:25 AM 148/95, 82 pulse. Started treatment at 10:45 AM. Explained any further questions or concerns before getting started. Instructed patient to blow her nose if needed then recline back to a 45 degree angle. Gave patient first dose 28 mg nasal spray, administered in each nostril as directed and observed by nurse, waited 5 more minutes for the second and third doses. After all doses given pt did not complain of any nausea/vomiting, pt did have a drink to help with the taste of Spravato it gives the metallic taste. Assessed her 40 minute vitals, 11:31 AM, 135/89, pulse 90, SpO2 98%.  Explained she would be monitored for a total time of 120 minutes after beginning treatment. Discharge vitals were taken at 12:45 PM 126/80, P 76, SpO2 98%. Dr. Geoffry came to visit with patient once her thoughts were clearer to discuss how treatment went. Pt has an apt with her  PCP on 02/28/24. Recommend she go home and sleep or just relax on the couch. No driving, no intense activities. Verbalized understanding. Pt. will be receiving 2 treatments per week for 4 weeks as recommended. Nurse was with pt a total of 60 minutes for clinical assessment.  Pt will get 84 mg.   (84 mg)  LOT [id number] EXP JUN 2028

## 2024-02-16 NOTE — Progress Notes (Signed)
 Gwendolyn Carrillo [id number] January 16, 1976 48 y.o.  Subjective:   Patient ID:  Gwendolyn Carrillo is a 48 y.o. (DOB Jan 04, 1976) female.  Chief Complaint:  Chief Complaint  Patient presents with   Follow-up   Depression   Anxiety    HPI LAKELY ELMENDORF presents to the office today for follow-up of treatment resistant major depression.  She also has a history of generalized anxiety disorder, OCD, and panic.  She describes a long history of depression and anxiety and has been unable to tolerate most antidepressants.  She is very eager to pursue Spravato for relief from her depression.    01/31/24 appt noted:  Her PHQ-9 score was 22 today.  She describes symptoms of anhedonia, low motivation, difficulty concentrating, poor productivity, difficulty being around people and wanting to isolate.  She is not suicidal but she has had hopelessness other than this opportunity to pursue Spravato.  She does not feel like she can function at work and is discussing leave of absence with her usual provider [employer], PNP. She was so excited to start treatment today that she had trouble sleeping last night. She received her first administration of Spravato at 56 mg today.  She had no untoward side effects such as nausea dizziness headache or vomiting.  She did have the expected dissociation but it was not scary.  In fact she experienced a relief from negative emotions such as depression and anxiety while under treatment. She was very drowsy and fell asleep.  She explained it as being due to being sleep deprived.  She is eager to continue the Spravato.  02/02/24 appt noted:  She received her second administration of Spravato at 56 mg today.  She had no untoward side effects such as nausea dizziness headache or vomiting.  She did have the expected dissociation but it was not scary.  In fact she experienced a relief from negative emotions such as depression and anxiety while under treatment. She was much less  drowsy with it.  Would like to increase dose as planned.  No SI.  Tolerating current tx plan.   02/14/24 appt noted: 2 Spravato 84 mg tx last week as planned Here for Spravato 84 mg today.  Expected dissociation well tolerated without NV, HA.  Some dizziness.  Good emotional relief from negativity immediately from Spravato.  Med: no fluoxetine  bc not tolerated Mood sig better by most in decades with Spravato.  Dep is much better.  Anxiety is better too.  Sleep is ok.  Wants to continue tx.  02/16/24 appt noted:  No psych meds bc not tolerated. Here for Spravato 84 mg today.  Expected dissociation well tolerated without NV, HA.  Some dizziness.  Good emotional relief from negativity immediately from Spravato.  Mood so far dramatically better and anxiety better with Spravato.  No new problems.  Wants to continue Spravato.   ECT-MADRS    Flowsheet Row Video Visit from 01/05/2024 in [name]  MADRS Total Score 31   GAD-7    Flowsheet Row Office Visit from 08/31/2022 in [facility] Office Visit from 03/03/2022 in [facility] Office Visit from 11/25/2021 in [facility] Office Visit from 08/12/2021 in [facility] Office Visit from 04/01/2021 in [facility]  Total GAD-7 Score 12 14 5 8 16    PHQ2-9    Flowsheet Row Video Visit from 01/05/2024 in [name]  Office Visit from 08/31/2022 in [facility] Office Visit from 03/03/2022 in [facility] Office Visit from 02/10/2022 in [facility] Office Visit from 11/25/2021 in [facility]  PHQ-2 Total Score 4 4 6 5 2   PHQ-9 Total Score -- 17 14 13 8    Flowsheet Row ED from 05/26/2023 in [facility] ED from 12/11/2022  in [facility] Department at [facility] ED from 05/05/2022 in [facility] Emergency Department at [facility]  C-SSRS RISK CATEGORY No Risk No Risk No Risk     Review of Systems:  Review of Systems  Cardiovascular:  Negative for chest pain and palpitations.  Gastrointestinal:  Negative for nausea and vomiting.  Neurological:  Negative for dizziness and headaches.  Psychiatric/Behavioral:  Positive for sleep disturbance. Negative for decreased concentration and dysphoric mood. The patient is nervous/anxious.     Medications: I have reviewed the patient's current medications.  Current Outpatient Medications  Medication Sig Dispense Refill   ALPRAZolam  (XANAX ) 1 MG tablet Take 1 tablet (1 mg total) by mouth 2 (two) times daily as needed for anxiety. 60 tablet 0   atorvastatin  (LIPITOR) 80 MG tablet Take 1 tablet (80 mg total) by mouth daily. 90 tablet 1   Blood Glucose Monitoring Suppl (ONETOUCH VERIO) w/Device KIT USE TO CHECK BLOOD SUGAR THREE TIMES DAILY. 1 kit 0   Cholecalciferol (VITAMIN D3) 50 MCG (2000 UT) capsule Take 1 capsule (2,000 Units total) by mouth daily. 90 capsule 1   clindamycin  (CLEOCIN ) 2 % vaginal cream Place 1 Applicatorful vaginally at bedtime. 40 g 0   Continuous Blood Gluc Receiver (DEXCOM G6 RECEIVER) DEVI 1 Bag by Does not apply route in the morning, at noon, and at bedtime. 1 each 0   Continuous Glucose Sensor (DEXCOM G6 SENSOR) MISC USE AS DIRECTED EVERY 10 DAYS 9 each 2   Continuous Glucose Transmitter (DEXCOM G6 TRANSMITTER) MISC USE AS DIRECTED OVER 90 DAYS 1 each 2   Continuous Glucose Transmitter (DEXCOM G6 TRANSMITTER) MISC USE AS DIRECTED OVER 90 DAYS     cyclobenzaprine  (FLEXERIL ) 10 MG tablet Take 1 tablet (10 mg total) by mouth 2 (two) times daily as needed for muscle spasms. 12 tablet 0   Elastic Bandages & Supports (ELBOW COMPRESSION) MISC 1 Application by Does not apply route daily. 1 each 0   glucose blood (ONETOUCH VERIO)  test strip USE TO CHECK BLOOD SUGAR THREE TIMES DAILY. 100 each 2   hydrochlorothiazide  (HYDRODIURIL ) 25 MG tablet Take 1 tablet (25 mg total) by mouth daily. 90 tablet 1   hydrOXYzine  (ATARAX ) 25 MG tablet Take 1 tablet (25 mg total) by mouth 3 (three) times daily as needed. 90 tablet 1   Insulin  Glargine (BASAGLAR  KWIKPEN) 100 UNIT/ML Inject 15 Units into the skin daily. 15 mL 0   insulin  lispro (HUMALOG  KWIKPEN) 100 UNIT/ML KwikPen INJECT up to 12u INTO THE SKIN 3 TIMES DAILY WITH MEALS per sliding scale: 200-250: 4 UNITS, 251-300: 6 UNITS, 301-350: 8 UNITS, 351-400: 10 UNITS,>400:12 UNITS AND CALL NP. Max daily dose: 36u 30 mL 0   Insulin  Pen Needle (PEN NEEDLES) 32G X 5 MM MISC Use to inject sliding scale Humalog  three times daily. 100 each 6   JARDIANCE  10 MG TABS tablet Take 10 mg by mouth daily.     lidocaine  (LIDODERM ) 5 % Place 1 patch onto the skin daily. Remove & Discard patch within  12 hours or as directed by MD 30 patch 0   LINZESS  145 MCG CAPS capsule Take 1 capsule (145 mcg total) by mouth daily as needed (constipation). 90 capsule 1   lisinopril  (ZESTRIL ) 20 MG tablet Take 1 tablet (20 mg total) by mouth daily. 90 tablet 1   metFORMIN (GLUCOPHAGE) 500 MG tablet Take 500 mg by mouth daily with breakfast.     metoCLOPramide  (REGLAN ) 10 MG tablet Take 10 mg by mouth every 6 (six) hours as needed.     metoprolol  tartrate (LOPRESSOR ) 25 MG tablet TAKE 1 TABLET BY MOUTH TWICE A DAY 60 tablet 0   norethindrone  (MICRONOR ) 0.35 MG tablet Take 1 tablet (0.35 mg total) by mouth daily. 28 tablet 0   omeprazole  (PRILOSEC) 20 MG capsule TAKE 1 CAPSULE BY MOUTH DAILY IF NEEDED FOR GASTRIC REFLUX 90 capsule 0   ondansetron  (ZOFRAN -ODT) 4 MG disintegrating tablet TAKE 1 TABLET BY MOUTH EVERY 8 HOURS AS NEEDED FOR NAUSEA AND VOMITING 12 tablet 0   OneTouch Delica Lancets 33G MISC USE TO CHECK BLOOD SUGAR THREE TIMES DAILY. 100 each 2   FLUoxetine  (PROZAC ) 10 MG capsule Take 1 capsule (10 mg total) by  mouth daily. (Patient not taking: Reported on 02/16/2024) 30 capsule 2   telmisartan (MICARDIS) 80 MG tablet Take 1 tablet by mouth daily.     No current facility-administered medications for this visit.    Medication Side Effects: None  Allergies: Not on File  Past Medical History:  Diagnosis Date   Anxiety    Diabetes mellitus without complication (HCC)    Gastroparesis    Hypertension    Major depressive disorder     Past Medical History, Surgical history, Social history, and Family history were reviewed and updated as appropriate.   Please see review of systems for further details on the patient's review from today.   Objective:   Physical Exam:  There were no vitals taken for this visit.  Physical Exam Constitutional:      General: She is not in acute distress. Musculoskeletal:        General: No deformity.  Neurological:     Mental Status: She is alert and oriented to person, place, and time.     Coordination: Coordination normal.  Psychiatric:        Attention and Perception: Attention and perception normal. She does not perceive auditory or visual hallucinations.        Mood and Affect: Mood is anxious and depressed. Affect is not blunt, angry, tearful or inappropriate.        Speech: Speech normal.        Behavior: Behavior normal.        Thought Content: Thought content normal. Thought content is not paranoid or delusional. Thought content does not include homicidal or suicidal ideation. Thought content does not include suicidal plan.        Cognition and Memory: Cognition and memory normal.        Judgment: Judgment normal.     Comments: Insight intact Dep markedly better     Lab Review:     Component Value Date/Time   NA 131 (L) 05/25/2023 2132   NA 141 08/31/2022 0954   K 3.5 05/25/2023 2132   CL 94 (L) 05/25/2023 2132   CO2 25 05/25/2023 2132   GLUCOSE 314 (H) 05/25/2023 2132   BUN 10 05/25/2023 2132   BUN 12 08/31/2022 0954   CREATININE 0.87  05/25/2023 2132   CALCIUM  9.5 05/25/2023  2132   PROT 8.4 (H) 05/25/2023 2132   PROT 7.6 08/31/2022 0954   ALBUMIN 4.4 05/25/2023 2132   ALBUMIN 4.3 08/31/2022 0954   AST 71 (H) 05/25/2023 2132   ALT 86 (H) 05/25/2023 2132   ALKPHOS 70 05/25/2023 2132   BILITOT 1.0 05/25/2023 2132   BILITOT 0.3 08/31/2022 0954   GFRNONAA >60 05/25/2023 2132   GFRAA >60 10/10/2019 1441       Component Value Date/Time   WBC 11.8 (H) 05/25/2023 2132   RBC 4.51 05/25/2023 2132   HGB 15.1 (H) 05/25/2023 2132   HGB 13.9 08/31/2022 0954   HCT 42.4 05/25/2023 2132   HCT 41.0 08/31/2022 0954   PLT 187 05/25/2023 2132   PLT 249 08/31/2022 0954   MCV 94.0 05/25/2023 2132   MCV 96 08/31/2022 0954   MCH 33.5 05/25/2023 2132   MCHC 35.6 05/25/2023 2132   RDW 12.5 05/25/2023 2132   RDW 12.6 08/31/2022 0954   LYMPHSABS 0.5 (L) 05/25/2023 2132   LYMPHSABS 2.3 08/31/2022 0954   MONOABS 0.5 05/25/2023 2132   EOSABS 0.2 05/25/2023 2132   EOSABS 0.3 08/31/2022 0954   BASOSABS 0.0 05/25/2023 2132   BASOSABS 0.1 08/31/2022 0954    No results found for: POCLITH, LITHIUM   No results found for: PHENYTOIN, PHENOBARB, VALPROATE, CBMZ   .res Assessment: Plan:    Orvetta was seen today for follow-up, depression and anxiety.  Diagnoses and all orders for this visit:  Recurrent major depression resistant to treatment  Panic attacks  Generalized anxiety disorder  Mixed obsessional thoughts and acts  Insomnia, unspecified type   Disc her TRD and the SE Spravato.  She is very excited to be receiving this bc poor tolerance AD and no sig benefit.  Patient was administered Spravato 84 mg intranasally today.  The patient experienced the typical dissociation which gradually resolved over the 2-hour period of observation.  There were no complications.  Specifically the patient did not have nausea or vomiting or headache.  Blood pressures remained within normal ranges at the 40-minute and 2-hour  follow-up intervals.  By the time the 2-hour observation period was met the patient was alert and oriented and able to exit without assistance.  Patient feels the Spravato administration is helpful for the treatment resistant depression and would like to continue the treatment.  See nursing note for further details.  BP better today but still borderline high  Continue  84 mg Spravato bc helps and well tolerated.      No med changes.  Follow-up twice weekly for Spravato administration for 4 weeks at least.  Lorene Macintosh MD, DFAPA  Please see After Visit Summary for patient specific instructions.  Future Appointments  Date Time Provider Department Center  02/21/2024  9:30 AM Cottle, Lorene KANDICE Raddle., MD [facility] None  02/21/2024  9:30 AM CP-NURSE [facility] None  02/23/2024  9:30 AM Cottle, Lorene KANDICE Raddle., MD [facility] None  02/23/2024  9:30 AM CP-NURSE [facility] None  04/05/2024 11:00 AM Mozingo, Regina Nattalie, NP [facility] None    No orders of the defined types were placed in this encounter.   -------------------------------

## 2024-02-17 ENCOUNTER — Other Ambulatory Visit: Payer: Self-pay | Admitting: Nurse Practitioner

## 2024-02-17 DIAGNOSIS — Z30011 Encounter for initial prescription of contraceptive pills: Secondary | ICD-10-CM

## 2024-02-17 NOTE — Telephone Encounter (Signed)
 Med refill request: [facility]   Last AEX: last OV 02/10/22 Next AEX: not scheduled message sent  Last MMG (if hormonal med) Refill authorized: Rx refused, pt has not been seen in office since 02/10/22. Any appts that have been scheduled have been canceled or no show. Routing to provider to review

## 2024-02-21 ENCOUNTER — Ambulatory Visit (INDEPENDENT_AMBULATORY_CARE_PROVIDER_SITE_OTHER): Admitting: Psychiatry

## 2024-02-21 ENCOUNTER — Encounter: Payer: Self-pay | Admitting: Psychiatry

## 2024-02-21 ENCOUNTER — Ambulatory Visit

## 2024-02-21 VITALS — BP 133/93 | HR 85

## 2024-02-21 DIAGNOSIS — G4700 Insomnia, unspecified: Secondary | ICD-10-CM

## 2024-02-21 DIAGNOSIS — F339 Major depressive disorder, recurrent, unspecified: Secondary | ICD-10-CM | POA: Diagnosis not present

## 2024-02-21 DIAGNOSIS — F411 Generalized anxiety disorder: Secondary | ICD-10-CM

## 2024-02-21 DIAGNOSIS — F422 Mixed obsessional thoughts and acts: Secondary | ICD-10-CM

## 2024-02-21 DIAGNOSIS — F410 Panic disorder [episodic paroxysmal anxiety] without agoraphobia: Secondary | ICD-10-CM

## 2024-02-21 NOTE — Progress Notes (Signed)
 NURSES NOTE:         Pt arrived for her 7th Spravato Treatment for treatment resistant depression, pt will be getting 84 mg (3 of the 28 mg inhalers), this will be her first dose at that strength. Pt sees [employer] and she will follow up with her for office visits and see her at treatment when she is in the office. Explained to pt how the treatments would be scheduled and answered any questions she had today. Pt's Spravato is billed through buy and bill medically.  Spravato medication is stored at treatment center per REMS/FDA guidelines. The medication is required to be locked behind two doors per REMS/FDA protocol. Medication is also disposed of properly after each use per regulations. All documentation for REMS is completed and submitted per FDA/REMS requirements.          Began taking patient's vital signs at 9:30 AM 144/87, pulse 76, SpO2 Sat. 98%. Vital Signs stable to receive her treatment. Explained any further questions or concerns before getting started. Instructed patient to blow her nose if needed then recline back to a 45 degree angle. Gave patient first dose 28 mg nasal spray, administered in each nostril as directed and observed by nurse, waited 5 more minutes for the second and third doses. After all doses given pt did not complain of any nausea/vomiting, pt did have a drink to help with the taste of Spravato it gives the metallic taste. Assessed her 40 minute vitals, 10:15 AM, 144/91, pulse 75, SpO2 97%.  Explained she would be monitored for a total time of 120 minutes after beginning treatment. Discharge vitals were taken at 11:37 AM 133/93, P 85, SpO2 99%. Dr. Geoffry came to visit with patient once her thoughts were clearer to discuss how treatment went. Pt has an apt with her PCP on 02/28/24. Recommend she go home and sleep or just relax on the couch. No driving, no intense activities. Verbalized understanding. Pt. will be receiving 2 treatments per week for 4 weeks as  recommended. Pt will be going down to once a week treatments starting next week. Pt will return this Thursday for #8. Nurse was with pt a total of 60 minutes for clinical assessment.  Pt will get 84 mg.   (84 mg)  LOT [id number] EXP JUN 2028

## 2024-02-21 NOTE — Progress Notes (Signed)
 Gwendolyn Carrillo [id number] 10/12/1975 48 y.o.  Subjective:   Patient ID:  Gwendolyn Carrillo is a 48 y.o. (DOB 05-29-1975) female.  Chief Complaint:  Chief Complaint  Patient presents with   Follow-up   Depression   Anxiety    HPI Gwendolyn Carrillo presents to the office today for follow-up of treatment resistant major depression.  She also has a history of generalized anxiety disorder, OCD, and panic.  She describes a long history of depression and anxiety and has been unable to tolerate most antidepressants.  She is very eager to pursue Spravato for relief from her depression.    01/31/24 appt noted:  Her PHQ-9 score was 22 today.  She describes symptoms of anhedonia, low motivation, difficulty concentrating, poor productivity, difficulty being around people and wanting to isolate.  She is not suicidal but she has had hopelessness other than this opportunity to pursue Spravato.  She does not feel like she can function at work and is discussing leave of absence with her usual provider [employer], PNP. She was so excited to start treatment today that she had trouble sleeping last night. She received her first administration of Spravato at 56 mg today.  She had no untoward side effects such as nausea dizziness headache or vomiting.  She did have the expected dissociation but it was not scary.  In fact she experienced a relief from negative emotions such as depression and anxiety while under treatment. She was very drowsy and fell asleep.  She explained it as being due to being sleep deprived.  She is eager to continue the Spravato.  02/02/24 appt noted:  She received her second administration of Spravato at 56 mg today.  She had no untoward side effects such as nausea dizziness headache or vomiting.  She did have the expected dissociation but it was not scary.  In fact she experienced a relief from negative emotions such as depression and anxiety while under treatment. She was much less  drowsy with it.  Would like to increase dose as planned.  No SI.  Tolerating current tx plan.   02/14/24 appt noted: 2 Spravato 84 mg tx last week as planned Here for Spravato 84 mg today.  Expected dissociation well tolerated without NV, HA.  Some dizziness.  Good emotional relief from negativity immediately from Spravato.  Med: no fluoxetine  bc not tolerated Mood sig better by most in decades with Spravato.  Dep is much better.  Anxiety is better too.  Sleep is ok.  Wants to continue tx.  02/16/24 appt noted:  No psych meds bc not tolerated. Here for Spravato 84 mg today.  Expected dissociation well tolerated without NV, HA.  Some dizziness.  Good emotional relief from negativity immediately from Spravato.  Mood so far dramatically better and anxiety better with Spravato.  No new problems.  Wants to continue Spravato.  02/21/24 appt noted:  No psych meds bc not tolerated. Here for Spravato 84 mg today.  Expected dissociation well tolerated without NV, HA.  Some dizziness.  Good emotional relief from negativity immediately from Spravato.  Dep has gone into remission Spravato and anxiety is much better .  She is very happy with the tx plan.  No new concerns.     ECT-MADRS    Flowsheet Row Video Visit from 01/05/2024 in [name]  MADRS Total Score 31   GAD-7    Flowsheet Row Office Visit from 08/31/2022 in [facility] Office Visit  from 03/03/2022 in [facility] Office Visit from 11/25/2021 in [facility] Office Visit from 08/12/2021 in [facility] Office Visit from 04/01/2021 in [facility]  Total GAD-7 Score 12 14 5 8 16    PHQ2-9    Flowsheet Row Video Visit from 01/05/2024 in [name] Office Visit from 08/31/2022 in [facility] Office Visit from 03/03/2022 in [facility] Office Visit from 02/10/2022 in [facility] Office Visit from 11/25/2021 in [facility]  PHQ-2 Total Score 4 4 6 5 2   PHQ-9 Total Score -- 17 14 13 8    Flowsheet Row ED from 05/26/2023 in [facility] Emergency Department at [facility] ED from 12/11/2022 in [facility] Emergency Department at [facility] ED from 05/05/2022 in [facility] Emergency Department at [facility]  C-SSRS RISK CATEGORY No Risk No Risk No Risk     Review of Systems:  Review of Systems  Cardiovascular:  Negative for chest pain and palpitations.  Gastrointestinal:  Negative for nausea and vomiting.  Neurological:  Negative for dizziness and headaches.  Psychiatric/Behavioral:  Negative for decreased concentration, dysphoric mood and sleep disturbance. The patient is nervous/anxious.     Medications: I have reviewed the patient's current medications.  Current Outpatient Medications  Medication Sig Dispense Refill   ALPRAZolam  (XANAX ) 1 MG tablet Take 1 tablet (1 mg total) by mouth 2 (two) times daily as needed for anxiety. 60 tablet 0   atorvastatin  (LIPITOR) 80 MG tablet Take 1 tablet (80 mg total) by mouth daily. 90 tablet 1   Blood Glucose Monitoring Suppl (ONETOUCH VERIO) w/Device KIT USE TO CHECK BLOOD SUGAR THREE TIMES DAILY. 1 kit 0   Cholecalciferol (VITAMIN D3) 50 MCG (2000 UT) capsule Take 1 capsule (2,000 Units total) by mouth daily. 90 capsule 1   clindamycin  (CLEOCIN ) 2 % vaginal cream Place 1 Applicatorful vaginally at bedtime. 40 g 0   Continuous Blood Gluc Receiver (DEXCOM G6 RECEIVER) DEVI 1 Bag by Does not apply route in the morning, at noon, and at bedtime. 1 each 0   Continuous Glucose Sensor (DEXCOM G6 SENSOR) MISC USE AS DIRECTED EVERY 10 DAYS 9 each 2   Continuous Glucose Transmitter (DEXCOM G6 TRANSMITTER) MISC USE AS DIRECTED OVER 90 DAYS 1 each 2   Continuous Glucose Transmitter  (DEXCOM G6 TRANSMITTER) MISC USE AS DIRECTED OVER 90 DAYS     cyclobenzaprine  (FLEXERIL ) 10 MG tablet Take 1 tablet (10 mg total) by mouth 2 (two) times daily as needed for muscle spasms. 12 tablet 0   Elastic Bandages & Supports (ELBOW COMPRESSION) MISC 1 Application by Does not apply route daily. 1 each 0   glucose blood (ONETOUCH VERIO) test strip USE TO CHECK BLOOD SUGAR THREE TIMES DAILY. 100 each 2   hydrochlorothiazide  (HYDRODIURIL ) 25 MG tablet Take 1 tablet (25 mg total) by mouth daily. 90 tablet 1   hydrOXYzine  (ATARAX ) 25 MG tablet Take 1 tablet (25 mg total) by mouth 3 (three) times daily as needed. 90 tablet 1   Insulin  Glargine (BASAGLAR  KWIKPEN) 100 UNIT/ML Inject 15 Units into the skin daily. 15 mL 0   insulin  lispro (HUMALOG  KWIKPEN) 100 UNIT/ML KwikPen INJECT up to 12u INTO THE SKIN 3 TIMES DAILY WITH MEALS per sliding scale: 200-250: 4 UNITS, 251-300: 6 UNITS, 301-350: 8 UNITS, 351-400: 10 UNITS,>400:12 UNITS AND CALL NP. Max  daily dose: 36u 30 mL 0   Insulin  Pen Needle (PEN NEEDLES) 32G X 5 MM MISC Use to inject sliding scale Humalog  three times daily. 100 each 6   JARDIANCE  10 MG TABS tablet Take 10 mg by mouth daily.     lidocaine  (LIDODERM ) 5 % Place 1 patch onto the skin daily. Remove & Discard patch within 12 hours or as directed by MD 30 patch 0   LINZESS  145 MCG CAPS capsule Take 1 capsule (145 mcg total) by mouth daily as needed (constipation). 90 capsule 1   lisinopril  (ZESTRIL ) 20 MG tablet Take 1 tablet (20 mg total) by mouth daily. 90 tablet 1   metFORMIN (GLUCOPHAGE) 500 MG tablet Take 500 mg by mouth daily with breakfast.     metoCLOPramide  (REGLAN ) 10 MG tablet Take 10 mg by mouth every 6 (six) hours as needed.     metoprolol  tartrate (LOPRESSOR ) 25 MG tablet TAKE 1 TABLET BY MOUTH TWICE A DAY 60 tablet 0   norethindrone  (MICRONOR ) 0.35 MG tablet Take 1 tablet (0.35 mg total) by mouth daily. 28 tablet 0   omeprazole  (PRILOSEC) 20 MG capsule TAKE 1 CAPSULE BY MOUTH  DAILY IF NEEDED FOR GASTRIC REFLUX 90 capsule 0   ondansetron  (ZOFRAN -ODT) 4 MG disintegrating tablet TAKE 1 TABLET BY MOUTH EVERY 8 HOURS AS NEEDED FOR NAUSEA AND VOMITING 12 tablet 0   OneTouch Delica Lancets 33G MISC USE TO CHECK BLOOD SUGAR THREE TIMES DAILY. 100 each 2   FLUoxetine  (PROZAC ) 10 MG capsule Take 1 capsule (10 mg total) by mouth daily. (Patient not taking: Reported on 02/21/2024) 30 capsule 2   telmisartan (MICARDIS) 80 MG tablet Take 1 tablet by mouth daily.     No current facility-administered medications for this visit.    Medication Side Effects: None  Allergies: Not on File  Past Medical History:  Diagnosis Date   Anxiety    Diabetes mellitus without complication (HCC)    Gastroparesis    Hypertension    Major depressive disorder     Past Medical History, Surgical history, Social history, and Family history were reviewed and updated as appropriate.   Please see review of systems for further details on the patient's review from today.   Objective:   Physical Exam:  There were no vitals taken for this visit.  Physical Exam Constitutional:      General: She is not in acute distress. Musculoskeletal:        General: No deformity.  Neurological:     Mental Status: She is alert and oriented to person, place, and time.     Coordination: Coordination normal.  Psychiatric:        Attention and Perception: Attention and perception normal. She does not perceive auditory or visual hallucinations.        Mood and Affect: Mood is anxious. Mood is not depressed. Affect is not blunt, angry, tearful or inappropriate.        Speech: Speech normal.        Behavior: Behavior normal.        Thought Content: Thought content normal. Thought content is not paranoid or delusional. Thought content does not include homicidal or suicidal ideation. Thought content does not include suicidal plan.        Cognition and Memory: Cognition and memory normal.        Judgment:  Judgment normal.     Comments: Insight intact Dep markedly better     Lab Review:  Component Value Date/Time   NA 131 (L) 05/25/2023 2132   NA 141 08/31/2022 0954   K 3.5 05/25/2023 2132   CL 94 (L) 05/25/2023 2132   CO2 25 05/25/2023 2132   GLUCOSE 314 (H) 05/25/2023 2132   BUN 10 05/25/2023 2132   BUN 12 08/31/2022 0954   CREATININE 0.87 05/25/2023 2132   CALCIUM  9.5 05/25/2023 2132   PROT 8.4 (H) 05/25/2023 2132   PROT 7.6 08/31/2022 0954   ALBUMIN 4.4 05/25/2023 2132   ALBUMIN 4.3 08/31/2022 0954   AST 71 (H) 05/25/2023 2132   ALT 86 (H) 05/25/2023 2132   ALKPHOS 70 05/25/2023 2132   BILITOT 1.0 05/25/2023 2132   BILITOT 0.3 08/31/2022 0954   GFRNONAA >60 05/25/2023 2132   GFRAA >60 10/10/2019 1441       Component Value Date/Time   WBC 11.8 (H) 05/25/2023 2132   RBC 4.51 05/25/2023 2132   HGB 15.1 (H) 05/25/2023 2132   HGB 13.9 08/31/2022 0954   HCT 42.4 05/25/2023 2132   HCT 41.0 08/31/2022 0954   PLT 187 05/25/2023 2132   PLT 249 08/31/2022 0954   MCV 94.0 05/25/2023 2132   MCV 96 08/31/2022 0954   MCH 33.5 05/25/2023 2132   MCHC 35.6 05/25/2023 2132   RDW 12.5 05/25/2023 2132   RDW 12.6 08/31/2022 0954   LYMPHSABS 0.5 (L) 05/25/2023 2132   LYMPHSABS 2.3 08/31/2022 0954   MONOABS 0.5 05/25/2023 2132   EOSABS 0.2 05/25/2023 2132   EOSABS 0.3 08/31/2022 0954   BASOSABS 0.0 05/25/2023 2132   BASOSABS 0.1 08/31/2022 0954    No results found for: POCLITH, LITHIUM   No results found for: PHENYTOIN, PHENOBARB, VALPROATE, CBMZ   .res Assessment: Plan:    Nakiyah was seen today for follow-up, depression and anxiety.  Diagnoses and all orders for this visit:  Recurrent major depression resistant to treatment  Panic attacks  Generalized anxiety disorder  Mixed obsessional thoughts and acts  Insomnia, unspecified type    Disc her TRD and the SE Spravato.  She is very excited to be receiving this bc poor tolerance AD and no sig  benefit.  Patient was administered Spravato 84 mg intranasally today.  The patient experienced the typical dissociation which gradually resolved over the 2-hour period of observation.  There were no complications.  Specifically the patient did not have nausea or vomiting or headache.  Blood pressures remained within normal ranges at the 40-minute and 2-hour follow-up intervals.  By the time the 2-hour observation period was met the patient was alert and oriented and able to exit without assistance.  Patient feels the Spravato administration is helpful for the treatment resistant depression and would like to continue the treatment.  See nursing note for further details.  BP better today but still borderline high  Continue  84 mg Spravato bc helps and well tolerated.      No med changes.  Consider alternatives to traditional AD given hx intolerance.  This might help reduce relapse risk.  Follow-up twice weekly for Spravato administration for this week and reevaluate.  Lorene Macintosh MD, DFAPA  Please see After Visit Summary for patient specific instructions.  Future Appointments  Date Time Provider Department Center  02/23/2024  9:30 AM Cottle, Lorene KANDICE Raddle., MD [facility] None  02/23/2024  9:30 AM CP-NURSE [facility] None  04/05/2024 11:00 AM Mozingo, Angeline Mattocks, NP [facility] None  05/03/2024 12:00 PM Prentiss Riggs A, NP [facility] None    No orders of the  defined types were placed in this encounter.   -------------------------------

## 2024-02-23 ENCOUNTER — Ambulatory Visit (INDEPENDENT_AMBULATORY_CARE_PROVIDER_SITE_OTHER): Admitting: Psychiatry

## 2024-02-23 ENCOUNTER — Ambulatory Visit

## 2024-02-23 ENCOUNTER — Ambulatory Visit: Payer: Self-pay | Admitting: Nurse Practitioner

## 2024-02-23 VITALS — BP 124/85 | HR 90

## 2024-02-23 DIAGNOSIS — F411 Generalized anxiety disorder: Secondary | ICD-10-CM

## 2024-02-23 DIAGNOSIS — G4700 Insomnia, unspecified: Secondary | ICD-10-CM

## 2024-02-23 DIAGNOSIS — F410 Panic disorder [episodic paroxysmal anxiety] without agoraphobia: Secondary | ICD-10-CM

## 2024-02-23 DIAGNOSIS — F339 Major depressive disorder, recurrent, unspecified: Secondary | ICD-10-CM | POA: Diagnosis not present

## 2024-02-23 DIAGNOSIS — F422 Mixed obsessional thoughts and acts: Secondary | ICD-10-CM

## 2024-02-23 NOTE — Progress Notes (Signed)
 NURSES NOTE:         Pt arrived for her 8th Spravato Treatment for treatment resistant depression, pt will be getting 84 mg (3 of the 28 mg inhalers), this will be her first dose at that strength. Pt sees [employer] and she will follow up with her for office visits and see her at treatment when she is in the office. Explained to pt how the treatments would be scheduled and answered any questions she had today. Pt's Spravato is billed through buy and bill medically.  Spravato medication is stored at treatment center per REMS/FDA guidelines. The medication is required to be locked behind two doors per REMS/FDA protocol. Medication is also disposed of properly after each use per regulations. All documentation for REMS is completed and submitted per FDA/REMS requirements.          Began taking patient's vital signs at 9:25 AM 133/96, pulse 85, SpO2 Sat. 97%. Vital Signs stable to receive her treatment. Explained any further questions or concerns before getting started. Instructed patient to blow her nose if needed then recline back to a 45 degree angle. Gave patient first dose 28 mg nasal spray, administered in each nostril as directed and observed by nurse, waited 5 more minutes for the second and third doses. After all doses given pt did not complain of any nausea/vomiting, pt did have a drink to help with the taste of Spravato it gives the metallic taste. Assessed her 40 minute vitals, 10:15 AM, 148/88, pulse 82, SpO2 96%.  Explained she would be monitored for a total time of 120 minutes after beginning treatment. Discharge vitals were taken at 11:25 AM 124/85, P 90, SpO2 98%. Dr. Geoffry came to visit with patient once her thoughts were clearer to discuss how treatment went. Pt has an apt with her PCP on 02/28/24. Recommend she go home and sleep or just relax on the couch. No driving, no intense activities. Verbalized understanding. Pt will be going down to once a week treatments starting next week.  Pt will return next Thursday. Nurse was with pt a total of 60 minutes for clinical assessment.  Pt will get 84 mg.   (84 mg)  LOT [id number] EXP [id number]

## 2024-02-27 ENCOUNTER — Telehealth: Payer: Self-pay | Admitting: Adult Health

## 2024-02-27 NOTE — Telephone Encounter (Signed)
 Pt called wanting to leave urgent msg for Traci. Pt thinks she needs 2 more weeks of 2x spravato a week and wants to come on Tues & Thurs.

## 2024-02-27 NOTE — Telephone Encounter (Signed)
 Agreed if insurance will approve. Gwendolyn Macintosh, MD, DFAPA

## 2024-02-27 NOTE — Telephone Encounter (Signed)
 Please see message.

## 2024-02-27 NOTE — Telephone Encounter (Signed)
 If she does continue her FMLA will also have to be continued. I think that is also what Andree mentioned she asked for too.

## 2024-02-27 NOTE — Telephone Encounter (Signed)
 Please contact pt to come back in for Spravato tomorrow.

## 2024-02-28 ENCOUNTER — Telehealth: Admitting: Adult Health

## 2024-02-28 ENCOUNTER — Encounter: Payer: Self-pay | Admitting: Psychiatry

## 2024-02-28 ENCOUNTER — Ambulatory Visit

## 2024-02-28 ENCOUNTER — Telehealth: Payer: Self-pay | Admitting: Adult Health

## 2024-02-28 ENCOUNTER — Ambulatory Visit: Admitting: Psychiatry

## 2024-02-28 VITALS — BP 134/83 | HR 85

## 2024-02-28 DIAGNOSIS — F339 Major depressive disorder, recurrent, unspecified: Secondary | ICD-10-CM | POA: Diagnosis not present

## 2024-02-28 DIAGNOSIS — G4700 Insomnia, unspecified: Secondary | ICD-10-CM

## 2024-02-28 DIAGNOSIS — F410 Panic disorder [episodic paroxysmal anxiety] without agoraphobia: Secondary | ICD-10-CM

## 2024-02-28 DIAGNOSIS — F422 Mixed obsessional thoughts and acts: Secondary | ICD-10-CM

## 2024-02-28 DIAGNOSIS — F411 Generalized anxiety disorder: Secondary | ICD-10-CM

## 2024-02-28 NOTE — Progress Notes (Signed)
 Gwendolyn Carrillo [id number] [birth year] 48 y.o.  Subjective:   Patient ID:  Gwendolyn Carrillo is a 48 y.o. (DOB 1976/01/15) female.  Chief Complaint:  Chief Complaint  Patient presents with   Follow-up   Depression   Anxiety    HPI Gwendolyn Carrillo presents to the office today for follow-up of treatment resistant major depression.  She also has a history of generalized anxiety disorder, OCD, and panic.  She describes a long history of depression and anxiety and has been unable to tolerate most antidepressants.  She is very eager to pursue Spravato for relief from her depression.    01/31/24 appt noted:  Her PHQ-9 score was 22 today.  She describes symptoms of anhedonia, low motivation, difficulty concentrating, poor productivity, difficulty being around people and wanting to isolate.  She is not suicidal but she has had hopelessness other than this opportunity to pursue Spravato.  She does not feel like she can function at work and is discussing leave of absence with her usual provider [employer], PNP. She was so excited to start treatment today that she had trouble sleeping last night. She received her first administration of Spravato at 56 mg today.  She had no untoward side effects such as nausea dizziness headache or vomiting.  She did have the expected dissociation but it was not scary.  In fact she experienced a relief from negative emotions such as depression and anxiety while under treatment. She was very drowsy and fell asleep.  She explained it as being due to being sleep deprived.  She is eager to continue the Spravato.  02/02/24 appt noted:  She received her second administration of Spravato at 56 mg today.  She had no untoward side effects such as nausea dizziness headache or vomiting.  She did have the expected dissociation but it was not scary.  In fact she experienced a relief from negative emotions such as depression and anxiety while under treatment. She was much less  drowsy with it.  Would like to increase dose as planned.  No SI.  Tolerating current tx plan.   02/14/24 appt noted: 2 Spravato 84 mg tx last week as planned Here for Spravato 84 mg today.  Expected dissociation well tolerated without NV, HA.  Some dizziness.  Good emotional relief from negativity immediately from Spravato.  Med: no fluoxetine  bc not tolerated Mood sig better by most in decades with Spravato.  Dep is much better.  Anxiety is better too.  Sleep is ok.  Wants to continue tx.  02/16/24 appt noted:  No psych meds bc not tolerated. Here for Spravato 84 mg today.  Expected dissociation well tolerated without NV, HA.  Some dizziness.  Good emotional relief from negativity immediately from Spravato.  Mood so far dramatically better and anxiety better with Spravato.  No new problems.  Wants to continue Spravato.  02/21/24 appt noted:  No psych meds bc not tolerated. Here for Spravato 84 mg today.  Expected dissociation well tolerated without NV, HA.  Some dizziness.  Good emotional relief from negativity immediately from Spravato.  Dep has gone into remission Spravato and anxiety is much better .  She is very happy with the tx plan.  No new concerns.    02/28/24 appt noted:  No psych meds bc not tolerated. Here for Spravato 84 mg today.  Expected dissociation well tolerated without NV, HA.  Some dizziness.  Good emotional relief from negativity immediately from Spravato.  Dep unexpectedly worse Saturday and  since.  Having experienced relief she doesn't want to go backwards. There are some stressors contributing to it but she wants to continue Spravato twice weekly until she gets better.   ECT-MADRS    Flowsheet Row Video Visit from 01/05/2024 in [name]  MADRS Total Score 31   GAD-7    Flowsheet Row Office Visit from 08/31/2022 in [facility] Office Visit from 03/03/2022 in [facility] Office  Visit from 11/25/2021 in [facility] Office Visit from 08/12/2021 in [facility] Office Visit from 04/01/2021 in [facility]  Total GAD-7 Score 12 14 5 8 16    PHQ2-9    Flowsheet Row Video Visit from 01/05/2024 in [name] Office Visit from 08/31/2022 in [facility] Office Visit from 03/03/2022 in [facility] Office Visit from 02/10/2022 in [facility] Office Visit from 11/25/2021 in [facility]  PHQ-2 Total Score 4 4 6 5 2   PHQ-9 Total Score -- 17 14 13 8    Flowsheet Row ED from 05/26/2023 in [facility] Emergency Department at [facility] ED from 12/11/2022 in [facility] Emergency Department at [facility] ED from 05/05/2022 in [facility] Emergency Department at [facility]  C-SSRS RISK CATEGORY No Risk No Risk No Risk     Review of Systems:  Review of Systems  Cardiovascular:  Negative for chest pain and palpitations.  Gastrointestinal:  Negative for nausea and vomiting.  Neurological:  Negative for dizziness and headaches.  Psychiatric/Behavioral:  Positive for dysphoric mood. Negative for decreased concentration and sleep disturbance. The patient is nervous/anxious.     Medications: I have reviewed the patient's current medications.  Current Outpatient Medications  Medication Sig Dispense Refill   ALPRAZolam  (XANAX ) 1 MG tablet Take 1 tablet (1 mg total) by mouth 2 (two) times daily as needed for anxiety. 60 tablet 0   atorvastatin  (LIPITOR) 80 MG tablet Take 1 tablet (80 mg total) by mouth daily. 90 tablet 1   Blood Glucose Monitoring Suppl (ONETOUCH VERIO) w/Device KIT USE TO CHECK BLOOD SUGAR THREE TIMES DAILY. 1 kit 0   Cholecalciferol (VITAMIN D3) 50 MCG (2000 UT) capsule Take 1 capsule (2,000 Units total) by mouth daily. 90 capsule 1    clindamycin  (CLEOCIN ) 2 % vaginal cream Place 1 Applicatorful vaginally at bedtime. 40 g 0   Continuous Blood Gluc Receiver (DEXCOM G6 RECEIVER) DEVI 1 Bag by Does not apply route in the morning, at noon, and at bedtime. 1 each 0   Continuous Glucose Sensor (DEXCOM G6 SENSOR) MISC USE AS DIRECTED EVERY 10 DAYS 9 each 2   Continuous Glucose Transmitter (DEXCOM G6 TRANSMITTER) MISC USE AS DIRECTED OVER 90 DAYS 1 each 2   Continuous Glucose Transmitter (DEXCOM G6 TRANSMITTER) MISC USE AS DIRECTED OVER 90 DAYS     cyclobenzaprine  (FLEXERIL ) 10 MG tablet Take 1 tablet (10 mg total) by mouth 2 (two) times daily as needed for muscle spasms. 12 tablet 0   Elastic Bandages & Supports (ELBOW COMPRESSION) MISC 1 Application by Does not apply route daily. 1 each 0   glucose blood (ONETOUCH VERIO) test strip USE TO CHECK BLOOD SUGAR THREE TIMES DAILY. 100 each 2   hydrochlorothiazide  (HYDRODIURIL ) 25 MG tablet Take 1 tablet (25 mg total) by mouth daily. 90 tablet 1   hydrOXYzine  (ATARAX ) 25 MG tablet Take  1 tablet (25 mg total) by mouth 3 (three) times daily as needed. 90 tablet 1   Insulin  Glargine (BASAGLAR  KWIKPEN) 100 UNIT/ML Inject 15 Units into the skin daily. 15 mL 0   insulin  lispro (HUMALOG  KWIKPEN) 100 UNIT/ML KwikPen INJECT up to 12u INTO THE SKIN 3 TIMES DAILY WITH MEALS per sliding scale: 200-250: 4 UNITS, 251-300: 6 UNITS, 301-350: 8 UNITS, 351-400: 10 UNITS,>400:12 UNITS AND CALL NP. Max daily dose: 36u 30 mL 0   Insulin  Pen Needle (PEN NEEDLES) 32G X 5 MM MISC Use to inject sliding scale Humalog  three times daily. 100 each 6   JARDIANCE  10 MG TABS tablet Take 10 mg by mouth daily.     lidocaine  (LIDODERM ) 5 % Place 1 patch onto the skin daily. Remove & Discard patch within 12 hours or as directed by MD 30 patch 0   LINZESS  145 MCG CAPS capsule Take 1 capsule (145 mcg total) by mouth daily as needed (constipation). 90 capsule 1   lisinopril  (ZESTRIL ) 20 MG tablet Take 1 tablet (20 mg total) by  mouth daily. 90 tablet 1   metFORMIN (GLUCOPHAGE) 500 MG tablet Take 500 mg by mouth daily with breakfast.     metoCLOPramide  (REGLAN ) 10 MG tablet Take 10 mg by mouth every 6 (six) hours as needed.     metoprolol  tartrate (LOPRESSOR ) 25 MG tablet TAKE 1 TABLET BY MOUTH TWICE A DAY 60 tablet 0   norethindrone  (MICRONOR ) 0.35 MG tablet Take 1 tablet (0.35 mg total) by mouth daily. 28 tablet 0   omeprazole  (PRILOSEC) 20 MG capsule TAKE 1 CAPSULE BY MOUTH DAILY IF NEEDED FOR GASTRIC REFLUX 90 capsule 0   ondansetron  (ZOFRAN -ODT) 4 MG disintegrating tablet TAKE 1 TABLET BY MOUTH EVERY 8 HOURS AS NEEDED FOR NAUSEA AND VOMITING 12 tablet 0   OneTouch Delica Lancets 33G MISC USE TO CHECK BLOOD SUGAR THREE TIMES DAILY. 100 each 2   FLUoxetine  (PROZAC ) 10 MG capsule Take 1 capsule (10 mg total) by mouth daily. (Patient not taking: Reported on 02/28/2024) 30 capsule 2   telmisartan (MICARDIS) 80 MG tablet Take 1 tablet by mouth daily.     No current facility-administered medications for this visit.    Medication Side Effects: None  Allergies: Not on File  Past Medical History:  Diagnosis Date   Anxiety    Diabetes mellitus without complication (HCC)    Gastroparesis    Hypertension    Major depressive disorder     Past Medical History, Surgical history, Social history, and Family history were reviewed and updated as appropriate.   Please see review of systems for further details on the patient's review from today.   Objective:   Physical Exam:  There were no vitals taken for this visit.  Physical Exam Constitutional:      General: She is not in acute distress. Musculoskeletal:        General: No deformity.  Neurological:     Mental Status: She is alert and oriented to person, place, and time.     Coordination: Coordination normal.  Psychiatric:        Attention and Perception: Attention and perception normal. She does not perceive auditory or visual hallucinations.        Mood and  Affect: Mood is anxious and depressed. Affect is not blunt, angry, tearful or inappropriate.        Speech: Speech normal.        Behavior: Behavior normal.  Thought Content: Thought content normal. Thought content is not paranoid or delusional. Thought content does not include homicidal or suicidal ideation. Thought content does not include suicidal plan.        Cognition and Memory: Cognition and memory normal.        Judgment: Judgment normal.     Comments: Insight intact      Lab Review:     Component Value Date/Time   NA 131 (L) 05/25/2023 2132   NA 141 08/31/2022 0954   K 3.5 05/25/2023 2132   CL 94 (L) 05/25/2023 2132   CO2 25 05/25/2023 2132   GLUCOSE 314 (H) 05/25/2023 2132   BUN 10 05/25/2023 2132   BUN 12 08/31/2022 0954   CREATININE 0.87 05/25/2023 2132   CALCIUM  9.5 05/25/2023 2132   PROT 8.4 (H) 05/25/2023 2132   PROT 7.6 08/31/2022 0954   ALBUMIN 4.4 05/25/2023 2132   ALBUMIN 4.3 08/31/2022 0954   AST 71 (H) 05/25/2023 2132   ALT 86 (H) 05/25/2023 2132   ALKPHOS 70 05/25/2023 2132   BILITOT 1.0 05/25/2023 2132   BILITOT 0.3 08/31/2022 0954   GFRNONAA >60 05/25/2023 2132   GFRAA >60 10/10/2019 1441       Component Value Date/Time   WBC 11.8 (H) 05/25/2023 2132   RBC 4.51 05/25/2023 2132   HGB 15.1 (H) 05/25/2023 2132   HGB 13.9 08/31/2022 0954   HCT 42.4 05/25/2023 2132   HCT 41.0 08/31/2022 0954   PLT 187 05/25/2023 2132   PLT 249 08/31/2022 0954   MCV 94.0 05/25/2023 2132   MCV 96 08/31/2022 0954   MCH 33.5 05/25/2023 2132   MCHC 35.6 05/25/2023 2132   RDW 12.5 05/25/2023 2132   RDW 12.6 08/31/2022 0954   LYMPHSABS 0.5 (L) 05/25/2023 2132   LYMPHSABS 2.3 08/31/2022 0954   MONOABS 0.5 05/25/2023 2132   EOSABS 0.2 05/25/2023 2132   EOSABS 0.3 08/31/2022 0954   BASOSABS 0.0 05/25/2023 2132   BASOSABS 0.1 08/31/2022 0954    No results found for: POCLITH, LITHIUM   No results found for: PHENYTOIN, PHENOBARB, VALPROATE, CBMZ    .res Assessment: Plan:    Yuriana was seen today for follow-up, depression and anxiety.  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  She is very excited to be receiving this bc poor tolerance AD and no sig benefit with AD. Markedly better with Spravato and then this weekend recurrence.  Patient was administered Spravato 84 mg intranasally today.  The patient experienced the typical dissociation which gradually resolved over the 2-hour period of observation.  There were no complications.  Specifically the patient did not have nausea or vomiting or headache.  Blood pressures remained within normal ranges at the 40-minute and 2-hour follow-up intervals.  By the time the 2-hour observation period was met the patient was alert and oriented and able to exit without assistance.  Patient feels the Spravato administration is helpful for the treatment resistant depression and would like to continue the treatment.  See nursing note for further details.  BP better today but still borderline high  Continue  84 mg Spravato bc helps and well tolerated.      No med changes.  Consider alternatives to traditional AD given hx intolerance.  This might help reduce relapse risk.  Follow-up twice weekly  for Spravato administration  Lorene Macintosh MD, DFAPA  Please see After Visit Summary for patient specific instructions.  Future Appointments  Date Time Provider Department Center  02/29/2024  1:00 PM Mozingo, Angeline Mattocks, NP [facility] None  03/01/2024  9:30 AM Cottle, Lorene KANDICE Raddle., MD [facility] None  03/01/2024  9:30 AM CP-NURSE [facility] None  03/08/2024  9:30 AM Cottle, Lorene KANDICE Raddle., MD [facility] None  03/08/2024  9:30 AM CP-NURSE [facility] None  03/15/2024  9:30 AM CP-NURSE [facility] None  03/15/2024  9:30 AM Mozingo, Regina  Nattalie, NP [facility] None  04/05/2024 11:00 AM Mozingo, Regina Nattalie, NP [facility] None  05/03/2024 12:00 PM Prentiss Riggs A, NP [facility] None    No orders of the defined types were placed in this encounter.   -------------------------------

## 2024-02-28 NOTE — Progress Notes (Addendum)
 Gwendolyn Carrillo [id number] 1975-06-13 48 y.o.  Subjective:   Patient ID:  Gwendolyn Carrillo is a 48 y.o. (DOB 1976/04/11) female.  Chief Complaint:  Chief Complaint  Patient presents with   Follow-up   Depression   Anxiety    HPI Gwendolyn Carrillo presents to the office today for follow-up of treatment resistant major depression.  She also has a history of generalized anxiety disorder, OCD, and panic.  She describes a long history of depression and anxiety and has been unable to tolerate most antidepressants.  She is very eager to pursue Spravato for relief from her depression.    01/31/24 appt noted:  Her PHQ-9 score was 22 today.  She describes symptoms of anhedonia, low motivation, difficulty concentrating, poor productivity, difficulty being around people and wanting to isolate.  She is not suicidal but she has had hopelessness other than this opportunity to pursue Spravato.  She does not feel like she can function at work and is discussing leave of absence with her usual provider [employer], PNP. She was so excited to start treatment today that she had trouble sleeping last night. She received her first administration of Spravato at 56 mg today.  She had no untoward side effects such as nausea dizziness headache or vomiting.  She did have the expected dissociation but it was not scary.  In fact she experienced a relief from negative emotions such as depression and anxiety while under treatment. She was very drowsy and fell asleep.  She explained it as being due to being sleep deprived.  She is eager to continue the Spravato.  02/02/24 appt noted:  She received her second administration of Spravato at 56 mg today.  She had no untoward side effects such as nausea dizziness headache or vomiting.  She did have the expected dissociation but it was not scary.  In fact she experienced a relief from negative emotions such as depression and anxiety while under treatment. She was much less  drowsy with it.  Would like to increase dose as planned.  No SI.  Tolerating current tx plan.   02/14/24 appt noted: 2 Spravato 84 mg tx last week as planned Here for Spravato 84 mg today.  Expected dissociation well tolerated without NV, HA.  Some dizziness.  Good emotional relief from negativity immediately from Spravato.  Med: no fluoxetine  bc not tolerated Mood sig better by most in decades with Spravato.  Dep is much better.  Anxiety is better too.  Sleep is ok.  Wants to continue tx.  02/16/24 appt noted:  No psych meds bc not tolerated. Here for Spravato 84 mg today.  Expected dissociation well tolerated without NV, HA.  Some dizziness.  Good emotional relief from negativity immediately from Spravato.  Mood so far dramatically better and anxiety better with Spravato.  No new problems.  Wants to continue Spravato.  02/21/24 appt noted:  No psych meds bc not tolerated. Here for Spravato 84 mg today.  Expected dissociation well tolerated without NV, HA.  Some dizziness.  Good emotional relief from negativity immediately from Spravato.  Dep has gone into remission Spravato and anxiety is much better .  She is very happy with the tx plan.  No new concerns.   Plan no med changes  02/23/24 appt noted:  No psych meds bc not tolerated. Here for Spravato 84 mg today.  Expected dissociation well tolerated without NV, HA.  Some dizziness.  Good emotional relief from negativity immediately from Spravato.  Doesn't  believe psych meds will help but is pleased with impact of Spravato.  Doing ok with just Spravato.  ECT-MADRS    Flowsheet Row Video Visit from 01/05/2024 in [name]  MADRS Total Score 31   GAD-7    Flowsheet Row Office Visit from 08/31/2022 in [facility] Office Visit from 03/03/2022 in [facility] Office Visit from 11/25/2021 in [facility] Office Visit from 08/12/2021 in  [facility] Office Visit from 04/01/2021 in [facility]  Total GAD-7 Score 12 14 5 8 16    PHQ2-9    Flowsheet Row Video Visit from 01/05/2024 in [name] Office Visit from 08/31/2022 in [facility] Office Visit from 03/03/2022 in [facility] Office Visit from 02/10/2022 in [facility] Office Visit from 11/25/2021 in [facility]  PHQ-2 Total Score 4 4 6 5 2   PHQ-9 Total Score -- 17 14 13 8    Flowsheet Row ED from 05/26/2023 in [facility] Emergency Department at [facility] ED from 12/11/2022 in [facility] Emergency Department at [facility] ED from 05/05/2022 in [facility] Emergency Department at [facility]  C-SSRS RISK CATEGORY No Risk No Risk No Risk     Review of Systems:  Review of Systems  Cardiovascular:  Negative for chest pain and palpitations.  Gastrointestinal:  Negative for nausea and vomiting.  Neurological:  Negative for dizziness and headaches.  Psychiatric/Behavioral:  Negative for decreased concentration, dysphoric mood, sleep disturbance and suicidal ideas. The patient is nervous/anxious.     Medications: I have reviewed the patient's current medications.  Current Outpatient Medications  Medication Sig Dispense Refill   atorvastatin  (LIPITOR) 80 MG tablet Take 1 tablet (80 mg total) by mouth daily. 90 tablet 1   Blood Glucose Monitoring Suppl (ONETOUCH VERIO) w/Device KIT USE TO CHECK BLOOD SUGAR THREE TIMES DAILY. 1 kit 0   Cholecalciferol (VITAMIN D3) 50 MCG (2000 UT) capsule Take 1 capsule (2,000 Units total) by mouth daily. 90 capsule 1   clindamycin  (CLEOCIN ) 2 % vaginal cream Place 1 Applicatorful vaginally at bedtime. 40 g 0   Continuous Blood Gluc Receiver (DEXCOM G6 RECEIVER) DEVI 1 Bag by Does not apply route in the morning, at noon, and at  bedtime. 1 each 0   Continuous Glucose Sensor (DEXCOM G6 SENSOR) MISC USE AS DIRECTED EVERY 10 DAYS 9 each 2   Continuous Glucose Transmitter (DEXCOM G6 TRANSMITTER) MISC USE AS DIRECTED OVER 90 DAYS 1 each 2   Continuous Glucose Transmitter (DEXCOM G6 TRANSMITTER) MISC USE AS DIRECTED OVER 90 DAYS     cyclobenzaprine  (FLEXERIL ) 10 MG tablet Take 1 tablet (10 mg total) by mouth 2 (two) times daily as needed for muscle spasms. 12 tablet 0   Elastic Bandages & Supports (ELBOW COMPRESSION) MISC 1 Application by Does not apply route daily. 1 each 0   glucose blood (ONETOUCH VERIO) test strip USE TO CHECK BLOOD SUGAR THREE TIMES DAILY. 100 each 2   hydrochlorothiazide  (HYDRODIURIL ) 25 MG tablet Take 1 tablet (25 mg total) by mouth daily. 90 tablet 1   hydrOXYzine  (ATARAX ) 25 MG tablet Take 1 tablet (25 mg total) by mouth 3 (three) times daily as needed. 90 tablet 1   Insulin  Glargine (BASAGLAR  KWIKPEN) 100 UNIT/ML Inject 15 Units into the skin daily. 15 mL 0   insulin  lispro (HUMALOG   KWIKPEN) 100 UNIT/ML KwikPen INJECT up to 12u INTO THE SKIN 3 TIMES DAILY WITH MEALS per sliding scale: 200-250: 4 UNITS, 251-300: 6 UNITS, 301-350: 8 UNITS, 351-400: 10 UNITS,>400:12 UNITS AND CALL NP. Max daily dose: 36u 30 mL 0   Insulin  Pen Needle (PEN NEEDLES) 32G X 5 MM MISC Use to inject sliding scale Humalog  three times daily. 100 each 6   JARDIANCE  10 MG TABS tablet Take 10 mg by mouth daily.     lidocaine  (LIDODERM ) 5 % Place 1 patch onto the skin daily. Remove & Discard patch within 12 hours or as directed by MD 30 patch 0   LINZESS  145 MCG CAPS capsule Take 1 capsule (145 mcg total) by mouth daily as needed (constipation). 90 capsule 1   lisinopril  (ZESTRIL ) 20 MG tablet Take 1 tablet (20 mg total) by mouth daily. 90 tablet 1   metFORMIN (GLUCOPHAGE) 500 MG tablet Take 500 mg by mouth daily with breakfast.     metoCLOPramide  (REGLAN ) 10 MG tablet Take 10 mg by mouth every 6 (six) hours as needed.     metoprolol   tartrate (LOPRESSOR ) 25 MG tablet TAKE 1 TABLET BY MOUTH TWICE A DAY 60 tablet 0   norethindrone  (MICRONOR ) 0.35 MG tablet Take 1 tablet (0.35 mg total) by mouth daily. 28 tablet 0   omeprazole  (PRILOSEC) 20 MG capsule TAKE 1 CAPSULE BY MOUTH DAILY IF NEEDED FOR GASTRIC REFLUX 90 capsule 0   ondansetron  (ZOFRAN -ODT) 4 MG disintegrating tablet TAKE 1 TABLET BY MOUTH EVERY 8 HOURS AS NEEDED FOR NAUSEA AND VOMITING 12 tablet 0   OneTouch Delica Lancets 33G MISC USE TO CHECK BLOOD SUGAR THREE TIMES DAILY. 100 each 2   ALPRAZolam  (XANAX ) 1 MG tablet TAKE 1 TABLET BY MOUTH 2 TIMES DAILY AS NEEDED FOR ANXIETY. 60 tablet 0   telmisartan (MICARDIS) 80 MG tablet Take 1 tablet by mouth daily.     No current facility-administered medications for this visit.    Medication Side Effects: None  Allergies: Not on File  Past Medical History:  Diagnosis Date   Anxiety    Diabetes mellitus without complication (HCC)    Gastroparesis    Hypertension    Major depressive disorder     Past Medical History, Surgical history, Social history, and Family history were reviewed and updated as appropriate.   Please see review of systems for further details on the patient's review from today.   Objective:   Physical Exam:  There were no vitals taken for this visit.  Physical Exam Constitutional:      General: She is not in acute distress. Musculoskeletal:        General: No deformity.  Neurological:     Mental Status: She is alert and oriented to person, place, and time.     Coordination: Coordination normal.  Psychiatric:        Attention and Perception: Attention and perception normal. She does not perceive auditory or visual hallucinations.        Mood and Affect: Mood is anxious. Mood is not depressed. Affect is not blunt, flat, angry, tearful or inappropriate.        Speech: Speech normal.        Behavior: Behavior normal.        Thought Content: Thought content normal. Thought content is not  paranoid or delusional. Thought content does not include homicidal or suicidal ideation. Thought content does not include suicidal plan.        Cognition and Memory:  Cognition and memory normal.        Judgment: Judgment normal.     Comments: Insight intact Dep markedly better     Lab Review:     Component Value Date/Time   NA 131 (L) 05/25/2023 2132   NA 141 08/31/2022 0954   K 3.5 05/25/2023 2132   CL 94 (L) 05/25/2023 2132   CO2 25 05/25/2023 2132   GLUCOSE 314 (H) 05/25/2023 2132   BUN 10 05/25/2023 2132   BUN 12 08/31/2022 0954   CREATININE 0.87 05/25/2023 2132   CALCIUM  9.5 05/25/2023 2132   PROT 8.4 (H) 05/25/2023 2132   PROT 7.6 08/31/2022 0954   ALBUMIN 4.4 05/25/2023 2132   ALBUMIN 4.3 08/31/2022 0954   AST 71 (H) 05/25/2023 2132   ALT 86 (H) 05/25/2023 2132   ALKPHOS 70 05/25/2023 2132   BILITOT 1.0 05/25/2023 2132   BILITOT 0.3 08/31/2022 0954   GFRNONAA >60 05/25/2023 2132   GFRAA >60 10/10/2019 1441       Component Value Date/Time   WBC 11.8 (H) 05/25/2023 2132   RBC 4.51 05/25/2023 2132   HGB 15.1 (H) 05/25/2023 2132   HGB 13.9 08/31/2022 0954   HCT 42.4 05/25/2023 2132   HCT 41.0 08/31/2022 0954   PLT 187 05/25/2023 2132   PLT 249 08/31/2022 0954   MCV 94.0 05/25/2023 2132   MCV 96 08/31/2022 0954   MCH 33.5 05/25/2023 2132   MCHC 35.6 05/25/2023 2132   RDW 12.5 05/25/2023 2132   RDW 12.6 08/31/2022 0954   LYMPHSABS 0.5 (L) 05/25/2023 2132   LYMPHSABS 2.3 08/31/2022 0954   MONOABS 0.5 05/25/2023 2132   EOSABS 0.2 05/25/2023 2132   EOSABS 0.3 08/31/2022 0954   BASOSABS 0.0 05/25/2023 2132   BASOSABS 0.1 08/31/2022 0954    No results found for: POCLITH, LITHIUM   No results found for: PHENYTOIN, PHENOBARB, VALPROATE, CBMZ   .res Assessment: Plan:    Alexyia was seen today for follow-up, depression and anxiety.  Diagnoses and all orders for this visit:  Recurrent major depression resistant to treatment  Panic  attacks  Generalized anxiety disorder  Mixed obsessional thoughts and acts  Insomnia, unspecified type     Disc her TRD and the SE Spravato.  She is very excited to be receiving this bc poor tolerance AD and no sig benefit.  Patient was administered Spravato 84 mg intranasally today.  The patient experienced the typical dissociation which gradually resolved over the 2-hour period of observation.  There were no complications.  Specifically the patient did not have nausea or vomiting or headache.  Blood pressures remained within normal ranges at the 40-minute and 2-hour follow-up intervals.  By the time the 2-hour observation period was met the patient was alert and oriented and able to exit without assistance.  Patient feels the Spravato administration is helpful for the treatment resistant depression and would like to continue the treatment.  See nursing note for further details.  BP better today but still borderline high  Continue  84 mg Spravato bc helps and well tolerated.      No med changes.  Consider alternatives to traditional AD given hx intolerance.  This might help reduce relapse risk.  Follow-up  Spravato administration  and will try to reduce frequency Lorene Macintosh MD, DFAPA  Please see After Visit Summary for patient specific instructions.  Future Appointments  Date Time Provider Department Center  03/13/2024  9:30 AM CP-NURSE [facility] None  03/13/2024  9:30 AM  Mozingo, Regina Nattalie, NP [facility] None  03/15/2024  9:30 AM CP-NURSE [facility] None  03/15/2024  9:30 AM Mozingo, Regina Nattalie, NP [facility] None  03/21/2024 11:30 AM Mozingo, Regina Nattalie, NP [facility] None  04/05/2024 11:00 AM Mozingo, Regina Nattalie, NP [facility] None  05/03/2024 12:00 PM Prentiss Riggs A, NP [facility] None    No orders of the defined types were placed in this encounter.   -------------------------------

## 2024-02-28 NOTE — Telephone Encounter (Signed)
 [employer] faxed forms to complete for disability benefits. Gave to Traci.

## 2024-02-28 NOTE — Progress Notes (Signed)
 NURSES NOTE:         Pt arrived for her 9th Spravato Treatment for treatment resistant depression, pt will be getting 84 mg (3 of the 28 mg inhalers), for continued maintenance dose. Pt sees [employer] and she will follow up with her for office visits and see her at treatment when she is in the office. Explained to pt how the treatments would be scheduled and answered any questions she had today. Pt's Spravato is billed through buy and bill medically.  Spravato medication is stored at treatment center per REMS/FDA guidelines. The medication is required to be locked behind two doors per REMS/FDA protocol. Medication is also disposed of properly after each use per regulations. All documentation for REMS is completed and submitted per FDA/REMS requirements.          Began taking patient's vital signs at 9:25 AM 151/88, pulse 82, SpO2 Sat. 96%. Vital Signs stable to receive her treatment. Explained any further questions or concerns before getting started. Instructed patient to blow her nose if needed then recline back to a 45 degree angle. Gave patient first dose 28 mg nasal spray, administered in each nostril as directed and observed by nurse, waited 5 more minutes for the second and third doses. After all doses given pt did not complain of any nausea/vomiting, pt did have a drink to help with the taste of Spravato it gives the metallic taste. Assessed her 40 minute vitals, 10:10 AM, 136/85, pulse 84, SpO2 96%.  Explained she would be monitored for a total time of 120 minutes after beginning treatment. Discharge vitals were taken at 11:21 AM 134/83, P 85, SpO2 98%. Dr. Geoffry came to visit with patient once her thoughts were clearer to discuss how treatment went. Pt was not scheduled for a treatment today she was suppose to go down to once a week treatments but reports she is unable to do that. Dr. Geoffry recommends she continue twice a week for a couple more weeks. Recommend she go home and sleep or  just relax on the couch. No driving, no intense activities. Verbalized understanding. Pt will return Thursday. Nurse was with pt a total of 60 minutes for clinical assessment.  Pt will get 84 mg.   (84 mg)  LOT [id number] EXP [id number]

## 2024-02-29 ENCOUNTER — Telehealth: Admitting: Adult Health

## 2024-02-29 ENCOUNTER — Telehealth (INDEPENDENT_AMBULATORY_CARE_PROVIDER_SITE_OTHER): Admitting: Adult Health

## 2024-02-29 ENCOUNTER — Other Ambulatory Visit: Payer: Self-pay

## 2024-02-29 ENCOUNTER — Encounter: Payer: Self-pay | Admitting: Adult Health

## 2024-02-29 DIAGNOSIS — F339 Major depressive disorder, recurrent, unspecified: Secondary | ICD-10-CM | POA: Diagnosis not present

## 2024-02-29 DIAGNOSIS — F422 Mixed obsessional thoughts and acts: Secondary | ICD-10-CM | POA: Diagnosis not present

## 2024-02-29 DIAGNOSIS — F410 Panic disorder [episodic paroxysmal anxiety] without agoraphobia: Secondary | ICD-10-CM

## 2024-02-29 DIAGNOSIS — F411 Generalized anxiety disorder: Secondary | ICD-10-CM | POA: Diagnosis not present

## 2024-02-29 DIAGNOSIS — G4700 Insomnia, unspecified: Secondary | ICD-10-CM

## 2024-02-29 MED ORDER — ALPRAZOLAM 1 MG PO TABS
1.0000 mg | ORAL_TABLET | Freq: Two times a day (BID) | ORAL | 0 refills | Status: DC | PRN
Start: 2024-02-29 — End: 2024-03-05

## 2024-02-29 NOTE — Progress Notes (Signed)
 Error

## 2024-02-29 NOTE — Progress Notes (Signed)
 Gwendolyn Carrillo [id number] [birth year] 48 y.o.  Virtual Visit via Video Note  I connected with pt @ on 02/29/24 at  2:00 PM EST by a video enabled telemedicine application and verified that I am speaking with the correct person using two identifiers.   I discussed the limitations of evaluation and management by telemedicine and the availability of in person appointments. The patient expressed understanding and agreed to proceed.  I discussed the assessment and treatment plan with the patient. The patient was provided an opportunity to ask questions and all were answered. The patient agreed with the plan and demonstrated an understanding of the instructions.   The patient was advised to call back or seek an in-person evaluation if the symptoms worsen or if the condition fails to improve as anticipated.  I provided 25 minutes of non-face-to-face time during this encounter.  The patient was located at home.  The provider was located at [facility].   Angeline LOISE Sayers, NP   Subjective:   Patient ID:  Gwendolyn Carrillo is a 48 y.o. (DOB 1975-08-14) female.  Chief Complaint: No chief complaint on file.   HPI Gwendolyn Carrillo presents for follow-up of MDD, mixed obsessional thoughts, insomnia, panic attacks and GAD.   Describes mood today as a little lower. Flat. Reports some tearfulness.  Mood symptoms - denies feeling depressed after starting the Spravato treatments. Report anxiety has been less intense. Denies irritability. Reports improved interest and motivation. Reports recent panic attack - situational. Reports some worry, rumination and over thinking - more situational. Reports family and financial stressors. Reports her hormonal fluctuations are still effecting mood - can't see her OB until January. She also reports higher blood pressure and plans to see PCP. Reports increased situational stressors. Reports mood has improved with the Spravato treatments. Stating I feel  like I'm making progress with Spravato and would like to continue the treatments for another month. Reports seeing a therapist - Verneita - [place]. Taking medications as prescribed.  Energy levels have improved. Active, does not have a regular exercise routine. She is able to find some joy in usual interests and activities. Single. Lives alone. Has 4 children. Family local. Spending time with family.  Appetite decreased. Weight gain - 190 pounds.  Reports sleep has improved. Averages 7 to 8 hours over the past 2 weeks.   Reports focus and concentration improved. Completing tasks. Reports improved self care - still a struggle for me. Managing some aspects of household. Typically working full time - remote position with [employer].  Denies AH or VH. Denies self harm. Denies substance use. Denies alcohol use.  Previous medications: Zoloft , Clonazepam    Review of Systems:  Review of Systems  Musculoskeletal:  Negative for gait problem.  Neurological:  Negative for tremors.  Psychiatric/Behavioral:         Please refer to HPI    Medications: I have reviewed the patient's current medications.  Current Outpatient Medications  Medication Sig Dispense Refill   ALPRAZolam  (XANAX ) 1 MG tablet Take 1 tablet (1 mg total) by mouth 2 (two) times daily as needed for anxiety. 60 tablet 0   atorvastatin  (LIPITOR) 80 MG tablet Take 1 tablet (80 mg total) by mouth daily. 90 tablet 1   Blood Glucose Monitoring Suppl (ONETOUCH VERIO) w/Device KIT USE TO CHECK BLOOD SUGAR THREE TIMES DAILY. 1 kit 0   Cholecalciferol (VITAMIN D3) 50 MCG (2000 UT) capsule Take 1 capsule (2,000 Units total) by mouth daily. 90 capsule 1  clindamycin  (CLEOCIN ) 2 % vaginal cream Place 1 Applicatorful vaginally at bedtime. 40 g 0   Continuous Blood Gluc Receiver (DEXCOM G6 RECEIVER) DEVI 1 Bag by Does not apply route in the morning, at noon, and at bedtime. 1 each 0   Continuous Glucose Sensor (DEXCOM G6 SENSOR) MISC USE AS  DIRECTED EVERY 10 DAYS 9 each 2   Continuous Glucose Transmitter (DEXCOM G6 TRANSMITTER) MISC USE AS DIRECTED OVER 90 DAYS 1 each 2   Continuous Glucose Transmitter (DEXCOM G6 TRANSMITTER) MISC USE AS DIRECTED OVER 90 DAYS     cyclobenzaprine  (FLEXERIL ) 10 MG tablet Take 1 tablet (10 mg total) by mouth 2 (two) times daily as needed for muscle spasms. 12 tablet 0   Elastic Bandages & Supports (ELBOW COMPRESSION) MISC 1 Application by Does not apply route daily. 1 each 0   FLUoxetine  (PROZAC ) 10 MG capsule Take 1 capsule (10 mg total) by mouth daily. (Patient not taking: Reported on 02/28/2024) 30 capsule 2   glucose blood (ONETOUCH VERIO) test strip USE TO CHECK BLOOD SUGAR THREE TIMES DAILY. 100 each 2   hydrochlorothiazide  (HYDRODIURIL ) 25 MG tablet Take 1 tablet (25 mg total) by mouth daily. 90 tablet 1   hydrOXYzine  (ATARAX ) 25 MG tablet Take 1 tablet (25 mg total) by mouth 3 (three) times daily as needed. 90 tablet 1   Insulin  Glargine (BASAGLAR  KWIKPEN) 100 UNIT/ML Inject 15 Units into the skin daily. 15 mL 0   insulin  lispro (HUMALOG  KWIKPEN) 100 UNIT/ML KwikPen INJECT up to 12u INTO THE SKIN 3 TIMES DAILY WITH MEALS per sliding scale: 200-250: 4 UNITS, 251-300: 6 UNITS, 301-350: 8 UNITS, 351-400: 10 UNITS,>400:12 UNITS AND CALL NP. Max daily dose: 36u 30 mL 0   Insulin  Pen Needle (PEN NEEDLES) 32G X 5 MM MISC Use to inject sliding scale Humalog  three times daily. 100 each 6   JARDIANCE  10 MG TABS tablet Take 10 mg by mouth daily.     lidocaine  (LIDODERM ) 5 % Place 1 patch onto the skin daily. Remove & Discard patch within 12 hours or as directed by MD 30 patch 0   LINZESS  145 MCG CAPS capsule Take 1 capsule (145 mcg total) by mouth daily as needed (constipation). 90 capsule 1   lisinopril  (ZESTRIL ) 20 MG tablet Take 1 tablet (20 mg total) by mouth daily. 90 tablet 1   metFORMIN (GLUCOPHAGE) 500 MG tablet Take 500 mg by mouth daily with breakfast.     metoCLOPramide  (REGLAN ) 10 MG tablet Take  10 mg by mouth every 6 (six) hours as needed.     metoprolol  tartrate (LOPRESSOR ) 25 MG tablet TAKE 1 TABLET BY MOUTH TWICE A DAY 60 tablet 0   norethindrone  (MICRONOR ) 0.35 MG tablet Take 1 tablet (0.35 mg total) by mouth daily. 28 tablet 0   omeprazole  (PRILOSEC) 20 MG capsule TAKE 1 CAPSULE BY MOUTH DAILY IF NEEDED FOR GASTRIC REFLUX 90 capsule 0   ondansetron  (ZOFRAN -ODT) 4 MG disintegrating tablet TAKE 1 TABLET BY MOUTH EVERY 8 HOURS AS NEEDED FOR NAUSEA AND VOMITING 12 tablet 0   OneTouch Delica Lancets 33G MISC USE TO CHECK BLOOD SUGAR THREE TIMES DAILY. 100 each 2   telmisartan (MICARDIS) 80 MG tablet Take 1 tablet by mouth daily.     No current facility-administered medications for this visit.    Medication Side Effects: None  Allergies: Not on File  Past Medical History:  Diagnosis Date   Anxiety    Diabetes mellitus without complication (HCC)  Gastroparesis    Hypertension    Major depressive disorder     Family History  Problem Relation Age of Onset   Hyperlipidemia Mother    Hypertension Mother    Hyperlipidemia Father    Hypertension Father    Diabetes Father    Diabetes Sister    Blindness Sister    Hypertension Brother    Diabetes Brother    Stroke Brother    Diabetes Maternal Aunt    Diabetes Maternal Grandmother    Hypertension Maternal Grandmother    Heart disease Maternal Grandmother    Diabetes Paternal Grandmother    Hypertension Paternal Grandmother    Heart disease Paternal Grandmother     Social History   Socioeconomic History   Marital status: Single    Spouse name: Not on file   Number of children: Not on file   Years of education: Not on file   Highest education level: Not on file  Occupational History   Not on file  Tobacco Use   Smoking status: Some Days    Current packs/day: 0.25    Types: Cigarettes   Smokeless tobacco: Never   Tobacco comments:    I smoke when I'm drinking  Vaping Use   Vaping status: Never Used   Substance and Sexual Activity   Alcohol use: Yes    Comment: occ   Drug use: Not Currently    Types: Marijuana   Sexual activity: Not Currently    Partners: Male    Comment: First IC <16, Partners >5, Hx of CT+, GC+  Other Topics Concern   Not on file  Social History Narrative   Not on file   Social Drivers of Health   Financial Resource Strain: Not on file  Food Insecurity: Low Risk  (11/28/2023)   Received from [facility]   Hunger Vital Sign    Within the past 12 months, you worried that your food would run out before you got money to buy more: Never true    Within the past 12 months, the food you bought just didn't last and you didn't have money to get more. : Never true  Transportation Needs: No Transportation Needs (11/28/2023)   Received from [facility]    In the past 12 months, has lack of reliable transportation kept you from medical appointments, meetings, work or from getting things needed for daily living? : No  Physical Activity: Not on file  Stress: Not on file  Social Connections: Unknown (08/25/2021)   Received from [facility]   Social Network    Social Network: Not on file  Intimate Partner Violence: Unknown (07/30/2021)   Received from [facility]   HITS    Physically Hurt: Not on file    Insult or Talk Down To: Not on file    Threaten Physical Harm: Not on file    Scream or Curse: Not on file    Past Medical History, Surgical history, Social history, and Family history were reviewed and updated as appropriate.   Please see review of systems for further details on the patient's review from today.   Objective:   Physical Exam:  There were no vitals taken for this visit.  Physical Exam  Lab Review:     Component Value Date/Time   NA 131 (L) 05/25/2023 2132   NA 141 08/31/2022 0954   K 3.5 05/25/2023 2132   CL 94 (L) 05/25/2023 2132   CO2 25 05/25/2023 2132   GLUCOSE  314 (H) 05/25/2023 2132   BUN 10 05/25/2023 2132   BUN  12 08/31/2022 0954   CREATININE 0.87 05/25/2023 2132   CALCIUM  9.5 05/25/2023 2132   PROT 8.4 (H) 05/25/2023 2132   PROT 7.6 08/31/2022 0954   ALBUMIN 4.4 05/25/2023 2132   ALBUMIN 4.3 08/31/2022 0954   AST 71 (H) 05/25/2023 2132   ALT 86 (H) 05/25/2023 2132   ALKPHOS 70 05/25/2023 2132   BILITOT 1.0 05/25/2023 2132   BILITOT 0.3 08/31/2022 0954   GFRNONAA >60 05/25/2023 2132   GFRAA >60 10/10/2019 1441       Component Value Date/Time   WBC 11.8 (H) 05/25/2023 2132   RBC 4.51 05/25/2023 2132   HGB 15.1 (H) 05/25/2023 2132   HGB 13.9 08/31/2022 0954   HCT 42.4 05/25/2023 2132   HCT 41.0 08/31/2022 0954   PLT 187 05/25/2023 2132   PLT 249 08/31/2022 0954   MCV 94.0 05/25/2023 2132   MCV 96 08/31/2022 0954   MCH 33.5 05/25/2023 2132   MCHC 35.6 05/25/2023 2132   RDW 12.5 05/25/2023 2132   RDW 12.6 08/31/2022 0954   LYMPHSABS 0.5 (L) 05/25/2023 2132   LYMPHSABS 2.3 08/31/2022 0954   MONOABS 0.5 05/25/2023 2132   EOSABS 0.2 05/25/2023 2132   EOSABS 0.3 08/31/2022 0954   BASOSABS 0.0 05/25/2023 2132   BASOSABS 0.1 08/31/2022 0954    No results found for: POCLITH, LITHIUM   No results found for: PHENYTOIN, PHENOBARB, VALPROATE, CBMZ   .res Assessment: Plan:    Plan:  PDMP reviewed  Xanax  1mg  BID as needed for anxiety and panic attacks Hydroxyzine  25mg  TID as needed Prozac  10mg  daily   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  Gene sight testing available.  Working with a paramedic.  RTC 4 weeks  25 minutes spent dedicated to the care of this patient on the date of this encounter to include pre-visit review of records, ordering of medication, post visit documentation, and face-to-face time with the patient discussing TRD, mixed obsessional thoughts, insomnia, panic attacks, and GAD. Discussed continuing current medication regimen. Patient was advised to contact office with any questions, adverse effects, or acute worsening in signs  and symptoms. Patient is to remain out of work - 02/24/2024 thru 03/26/2024 for Spravato treatments twice weekly. A return to work date will be determined at appointment on 03/26/2024.   Discussed potential benefits, risk, and side effects of benzodiazepines to include potential risk of tolerance and dependence, as well as possible drowsiness. Advised patient not to drive if experiencing drowsiness and to take lowest possible effective dose to minimize risk of dependence and tolerance.  There are no diagnoses linked to this encounter.   Please see After Visit Summary for patient specific instructions.  Future Appointments  Date Time Provider Department Center  03/01/2024  9:30 AM Cottle, Lorene KANDICE Raddle., MD [facility] None  03/01/2024  9:30 AM CP-NURSE [facility] None  03/06/2024  9:30 AM Cottle, Lorene KANDICE Raddle., MD [facility] None  03/06/2024  9:30 AM CP-NURSE [facility] None  03/08/2024  9:30 AM Cottle, Lorene KANDICE Raddle., MD [facility] None  03/08/2024  9:30 AM CP-NURSE [facility] None  03/15/2024  9:30 AM CP-NURSE [facility] None  03/15/2024  9:30 AM Athan Casalino Nattalie, NP [facility] None  04/05/2024 11:00 AM Davell Beckstead Nattalie, NP [facility] None  05/03/2024 12:00 PM Prentiss Riggs A, NP [facility] None    No orders of the defined types were placed in this encounter.     -------------------------------

## 2024-03-01 ENCOUNTER — Encounter: Payer: Self-pay | Admitting: Psychiatry

## 2024-03-01 ENCOUNTER — Ambulatory Visit

## 2024-03-01 ENCOUNTER — Ambulatory Visit: Admitting: Psychiatry

## 2024-03-01 ENCOUNTER — Ambulatory Visit: Admitting: Nurse Practitioner

## 2024-03-01 VITALS — BP 148/81 | HR 84

## 2024-03-01 DIAGNOSIS — F411 Generalized anxiety disorder: Secondary | ICD-10-CM

## 2024-03-01 DIAGNOSIS — G4700 Insomnia, unspecified: Secondary | ICD-10-CM

## 2024-03-01 DIAGNOSIS — F410 Panic disorder [episodic paroxysmal anxiety] without agoraphobia: Secondary | ICD-10-CM

## 2024-03-01 DIAGNOSIS — F422 Mixed obsessional thoughts and acts: Secondary | ICD-10-CM

## 2024-03-01 DIAGNOSIS — F339 Major depressive disorder, recurrent, unspecified: Secondary | ICD-10-CM | POA: Diagnosis not present

## 2024-03-01 NOTE — Progress Notes (Signed)
 NURSES NOTE:         Pt arrived for her 10 th Spravato Treatment for treatment resistant depression, pt will be getting 84 mg (3 of the 28 mg inhalers), for continued maintenance dose. Pt sees [employer] and she will follow up with her for office visits and see her at treatment when she is in the office. Explained to pt how the treatments would be scheduled and answered any questions she had today. Pt's Spravato is billed through buy and bill medically.  Spravato medication is stored at treatment center per REMS/FDA guidelines. The medication is required to be locked behind two doors per REMS/FDA protocol. Medication is also disposed of properly after each use per regulations. All documentation for REMS is completed and submitted per FDA/REMS requirements.          Began taking patient's vital signs at 9:30 AM 151/99, pulse 76, SpO2 Sat. 96%, 147/99. Dr. Geoffry advises to give Clonidine 0.1 mg when starting her treatment. Instructed patient to blow her nose if needed then recline back to a 45 degree angle. Gave patient first dose 28 mg nasal spray, administered in each nostril as directed and observed by nurse, waited 5 more minutes for the second and third doses. After all doses given pt did not complain of any nausea/vomiting, pt did have a drink to help with the taste of Spravato it gives the metallic taste. Assessed her 40 minute vitals, 10:12 AM, 134/80, pulse 83, SpO2 95%.  Explained she would be monitored for a total time of 120 minutes after beginning treatment. Discharge vitals were taken at 11:36 AM 148/81, P 84, SpO2 95%. Dr. Geoffry came to visit with patient once her thoughts were clearer to discuss how treatment went. Pt's paperwork was completed for her to be out of work an additional 4 weeks per [place]. Dr. Geoffry recommends she continue twice a week for a couple more weeks. Recommend she go home and sleep or just relax on the couch. No driving, no intense activities. Verbalized  understanding. Pt will return Tuesdays and Thursdays. Nurse was with pt a total of 60 minutes for clinical assessment.  Pt will get 84 mg.   (84 mg)  LOT [id number] EXP [id number]

## 2024-03-01 NOTE — Progress Notes (Signed)
 Gwendolyn Carrillo [id number] [birth year] 48 y.o.  Subjective:   Patient ID:  Gwendolyn Carrillo is a 48 y.o. (DOB 02-26-1976) female.  Chief Complaint:  Chief Complaint  Patient presents with   Follow-up   Depression   Anxiety    HPI Gwendolyn Carrillo presents to the office today for follow-up of treatment resistant major depression.  She also has a history of generalized anxiety disorder, OCD, and panic.  She describes a long history of depression and anxiety and has been unable to tolerate most antidepressants.  She is very eager to pursue Spravato for relief from her depression.    01/31/24 appt noted:  Her PHQ-9 score was 22 today.  She describes symptoms of anhedonia, low motivation, difficulty concentrating, poor productivity, difficulty being around people and wanting to isolate.  She is not suicidal but she has had hopelessness other than this opportunity to pursue Spravato.  She does not feel like she can function at work and is discussing leave of absence with her usual provider [employer], PNP. She was so excited to start treatment today that she had trouble sleeping last night. She received her first administration of Spravato at 46 mg today.  She had no untoward side effects such as nausea dizziness headache or vomiting.  She did have the expected dissociation but it was not scary.  In fact she experienced a relief from negative emotions such as depression and anxiety while under treatment. She was very drowsy and fell asleep.  She explained it as being due to being sleep deprived.  She is eager to continue the Spravato.  02/02/24 appt noted:  She received her second administration of Spravato at 46 mg today.  She had no untoward side effects such as nausea dizziness headache or vomiting.  She did have the expected dissociation but it was not scary.  In fact she experienced a relief from negative emotions such as depression and anxiety while under treatment. She was much less  drowsy with it.  Would like to increase dose as planned.  No SI.  Tolerating current tx plan.   02/14/24 appt noted: 2 Spravato 84 mg tx last week as planned Here for Spravato 84 mg today.  Expected dissociation well tolerated without NV, HA.  Some dizziness.  Good emotional relief from negativity immediately from Spravato.  Med: no fluoxetine  bc not tolerated Mood sig better by most in decades with Spravato.  Dep is much better.  Anxiety is better too.  Sleep is ok.  Wants to continue tx.  02/16/24 appt noted:  No psych meds bc not tolerated. Here for Spravato 84 mg today.  Expected dissociation well tolerated without NV, HA.  Some dizziness.  Good emotional relief from negativity immediately from Spravato.  Mood so far dramatically better and anxiety better with Spravato.  No new problems.  Wants to continue Spravato.  02/21/24 appt noted:  No psych meds bc not tolerated. Here for Spravato 84 mg today.  Expected dissociation well tolerated without NV, HA.  Some dizziness.  Good emotional relief from negativity immediately from Spravato.  Dep has gone into remission Spravato and anxiety is much better .  She is very happy with the tx plan.  No new concerns.    02/28/24 appt noted:  No psych meds bc not tolerated. Here for Spravato 84 mg today.  Expected dissociation well tolerated without NV, HA.  Some dizziness.  Good emotional relief from negativity immediately from Spravato.  Dep unexpectedly worse Saturday and  since.  Having experienced relief she doesn't want to go backwards. There are some stressors contributing to it but she wants to continue Spravato twice weekly until she gets better.  03/01/24 appt noted: No psych meds bc not tolerated. Here for Spravato 84 mg today.  Expected dissociation well tolerated without NV, HA.  Some dizziness.  Good emotional relief from negativity immediately from Spravato.  Felt need bc relapse dep to increase Spravato back to twice weekly.   Feels  better about getting second spravato this week.  She believes it will help get her out of the dep like she was before.  Wants to avoid psych med bc hx intolerance.  BP was high this am and required clonidine 0.1 mg which resolved BP px.  She will discuss with PDP later this month.  ECT-MADRS    Flowsheet Row Video Visit from 01/05/2024 in [name]  MADRS Total Score 31   GAD-7    Flowsheet Row Office Visit from 08/31/2022 in [facility] Office Visit from 03/03/2022 in [facility] Office Visit from 11/25/2021 in [facility] Office Visit from 08/12/2021 in [facility] Office Visit from 04/01/2021 in [facility]  Total GAD-7 Score 12 14 5 8 16    PHQ2-9    Flowsheet Row Video Visit from 01/05/2024 in [name] Office Visit from 08/31/2022 in [facility] Office Visit from 03/03/2022 in [facility] Office Visit from 02/10/2022 in [facility] Office Visit from 11/25/2021 in [facility]  PHQ-2 Total Score 4 4 6 5 2   PHQ-9 Total Score -- 17 14 13 8    Flowsheet Row ED from 05/26/2023 in [facility] Department at [facility] ED from 12/11/2022 in [facility] Department at [facility] ED from 05/05/2022 in [facility] Emergency Department at [facility]  C-SSRS RISK CATEGORY No Risk No Risk No Risk     Review of Systems:  Review of Systems  Cardiovascular:  Negative for chest pain and palpitations.  Gastrointestinal:  Negative for nausea and vomiting.  Neurological:  Negative for dizziness, weakness and headaches.  Psychiatric/Behavioral:  Positive for dysphoric mood. Negative for decreased concentration and sleep disturbance. The patient is nervous/anxious.      Medications: I have reviewed the patient's current medications.  Current Outpatient Medications  Medication Sig Dispense Refill   ALPRAZolam  (XANAX ) 1 MG tablet Take 1 tablet (1 mg total) by mouth 2 (two) times daily as needed for anxiety. 10 tablet 0   atorvastatin  (LIPITOR) 80 MG tablet Take 1 tablet (80 mg total) by mouth daily. 90 tablet 1   Blood Glucose Monitoring Suppl (ONETOUCH VERIO) w/Device KIT USE TO CHECK BLOOD SUGAR THREE TIMES DAILY. 1 kit 0   Cholecalciferol (VITAMIN D3) 50 MCG (2000 UT) capsule Take 1 capsule (2,000 Units total) by mouth daily. 90 capsule 1   clindamycin  (CLEOCIN ) 2 % vaginal cream Place 1 Applicatorful vaginally at bedtime. 40 g 0   Continuous Blood Gluc Receiver (DEXCOM G6 RECEIVER) DEVI 1 Bag by Does not apply route in the morning, at noon, and at bedtime. 1 each 0   Continuous Glucose Sensor (DEXCOM G6 SENSOR) MISC USE AS DIRECTED EVERY 10 DAYS 9 each 2   Continuous Glucose Transmitter (DEXCOM G6 TRANSMITTER) MISC USE AS DIRECTED OVER 90 DAYS 1 each 2  Continuous Glucose Transmitter (DEXCOM G6 TRANSMITTER) MISC USE AS DIRECTED OVER 90 DAYS     cyclobenzaprine  (FLEXERIL ) 10 MG tablet Take 1 tablet (10 mg total) by mouth 2 (two) times daily as needed for muscle spasms. 12 tablet 0   Elastic Bandages & Supports (ELBOW COMPRESSION) MISC 1 Application by Does not apply route daily. 1 each 0   glucose blood (ONETOUCH VERIO) test strip USE TO CHECK BLOOD SUGAR THREE TIMES DAILY. 100 each 2   hydrochlorothiazide  (HYDRODIURIL ) 25 MG tablet Take 1 tablet (25 mg total) by mouth daily. 90 tablet 1   hydrOXYzine  (ATARAX ) 25 MG tablet Take 1 tablet (25 mg total) by mouth 3 (three) times daily as needed. 90 tablet 1   Insulin  Glargine (BASAGLAR  KWIKPEN) 100 UNIT/ML Inject 15 Units into the skin daily. 15 mL 0   insulin  lispro (HUMALOG  KWIKPEN) 100 UNIT/ML KwikPen INJECT up to 12u INTO THE SKIN 3 TIMES DAILY WITH MEALS per sliding scale: 200-250: 4 UNITS, 251-300: 6  UNITS, 301-350: 8 UNITS, 351-400: 10 UNITS,>400:12 UNITS AND CALL NP. Max daily dose: 36u 30 mL 0   Insulin  Pen Needle (PEN NEEDLES) 32G X 5 MM MISC Use to inject sliding scale Humalog  three times daily. 100 each 6   JARDIANCE  10 MG TABS tablet Take 10 mg by mouth daily.     lidocaine  (LIDODERM ) 5 % Place 1 patch onto the skin daily. Remove & Discard patch within 12 hours or as directed by MD 30 patch 0   LINZESS  145 MCG CAPS capsule Take 1 capsule (145 mcg total) by mouth daily as needed (constipation). 90 capsule 1   lisinopril  (ZESTRIL ) 20 MG tablet Take 1 tablet (20 mg total) by mouth daily. 90 tablet 1   metFORMIN (GLUCOPHAGE) 500 MG tablet Take 500 mg by mouth daily with breakfast.     metoCLOPramide  (REGLAN ) 10 MG tablet Take 10 mg by mouth every 6 (six) hours as needed.     metoprolol  tartrate (LOPRESSOR ) 25 MG tablet TAKE 1 TABLET BY MOUTH TWICE A DAY 60 tablet 0   norethindrone  (MICRONOR ) 0.35 MG tablet Take 1 tablet (0.35 mg total) by mouth daily. 28 tablet 0   omeprazole  (PRILOSEC) 20 MG capsule TAKE 1 CAPSULE BY MOUTH DAILY IF NEEDED FOR GASTRIC REFLUX 90 capsule 0   ondansetron  (ZOFRAN -ODT) 4 MG disintegrating tablet TAKE 1 TABLET BY MOUTH EVERY 8 HOURS AS NEEDED FOR NAUSEA AND VOMITING 12 tablet 0   OneTouch Delica Lancets 33G MISC USE TO CHECK BLOOD SUGAR THREE TIMES DAILY. 100 each 2   telmisartan (MICARDIS) 80 MG tablet Take 1 tablet by mouth daily.     No current facility-administered medications for this visit.    Medication Side Effects: None  Allergies: Not on File  Past Medical History:  Diagnosis Date   Anxiety    Diabetes mellitus without complication (HCC)    Gastroparesis    Hypertension    Major depressive disorder     Past Medical History, Surgical history, Social history, and Family history were reviewed and updated as appropriate.   Please see review of systems for further details on the patient's review from today.   Objective:   Physical Exam:   There were no vitals taken for this visit.  Physical Exam Constitutional:      General: She is not in acute distress. Musculoskeletal:        General: No deformity.  Neurological:     Mental Status: She is alert and oriented to person,  place, and time.     Coordination: Coordination normal.  Psychiatric:        Attention and Perception: Attention and perception normal. She does not perceive auditory or visual hallucinations.        Mood and Affect: Mood is anxious and depressed. Affect is not blunt, angry, tearful or inappropriate.        Speech: Speech normal.        Behavior: Behavior normal.        Thought Content: Thought content normal. Thought content is not paranoid or delusional. Thought content does not include homicidal or suicidal ideation. Thought content does not include suicidal plan.        Cognition and Memory: Cognition and memory normal.        Judgment: Judgment normal.     Comments: Insight intact Dep better than last seen     Lab Review:     Component Value Date/Time   NA 131 (L) 05/25/2023 2132   NA 141 08/31/2022 0954   K 3.5 05/25/2023 2132   CL 94 (L) 05/25/2023 2132   CO2 25 05/25/2023 2132   GLUCOSE 314 (H) 05/25/2023 2132   BUN 10 05/25/2023 2132   BUN 12 08/31/2022 0954   CREATININE 0.87 05/25/2023 2132   CALCIUM  9.5 05/25/2023 2132   PROT 8.4 (H) 05/25/2023 2132   PROT 7.6 08/31/2022 0954   ALBUMIN 4.4 05/25/2023 2132   ALBUMIN 4.3 08/31/2022 0954   AST 71 (H) 05/25/2023 2132   ALT 86 (H) 05/25/2023 2132   ALKPHOS 70 05/25/2023 2132   BILITOT 1.0 05/25/2023 2132   BILITOT 0.3 08/31/2022 0954   GFRNONAA >60 05/25/2023 2132   GFRAA >60 10/10/2019 1441       Component Value Date/Time   WBC 11.8 (H) 05/25/2023 2132   RBC 4.51 05/25/2023 2132   HGB 15.1 (H) 05/25/2023 2132   HGB 13.9 08/31/2022 0954   HCT 42.4 05/25/2023 2132   HCT 41.0 08/31/2022 0954   PLT 187 05/25/2023 2132   PLT 249 08/31/2022 0954   MCV 94.0 05/25/2023 2132    MCV 96 08/31/2022 0954   MCH 33.5 05/25/2023 2132   MCHC 35.6 05/25/2023 2132   RDW 12.5 05/25/2023 2132   RDW 12.6 08/31/2022 0954   LYMPHSABS 0.5 (L) 05/25/2023 2132   LYMPHSABS 2.3 08/31/2022 0954   MONOABS 0.5 05/25/2023 2132   EOSABS 0.2 05/25/2023 2132   EOSABS 0.3 08/31/2022 0954   BASOSABS 0.0 05/25/2023 2132   BASOSABS 0.1 08/31/2022 0954    No results found for: POCLITH, LITHIUM   No results found for: PHENYTOIN, PHENOBARB, VALPROATE, CBMZ   .res Assessment: Plan:    Lita was seen today for follow-up, depression and anxiety.  Diagnoses and all orders for this visit:  Recurrent major depression resistant to treatment  Panic attacks  Mixed obsessional thoughts and acts  Generalized anxiety disorder  Insomnia, unspecified type   Disc her TRD and the SE Spravato.  She is very excited to be receiving this bc poor tolerance AD and no sig benefit with AD. Markedly better with Spravato and then this weekend recurrence.  Patient was administered Spravato 84 mg intranasally today.  The patient experienced the typical dissociation which gradually resolved over the 2-hour period of observation.  There were no complications.  Specifically the patient did not have nausea or vomiting or headache.  Blood pressures remained within normal ranges at the 40-minute and 2-hour follow-up intervals.  By the time the 2-hour observation  period was met the patient was alert and oriented and able to exit without assistance.  Patient feels the Spravato administration is helpful for the treatment resistant depression and would like to continue the treatment.  See nursing note for further details.  BP issues.  She will discuss with PCP.  Reqwuires clonidine 0.1 mg prn in office and tolerated well.  Continue  84 mg Spravato bc helps and well tolerated.      No med changes.  Consider alternatives to traditional AD given hx intolerance.  This might help reduce relapse  risk.  Follow-up twice weekly for Spravato administration  Lorene Macintosh MD, DFAPA  Please see After Visit Summary for patient specific instructions.  Future Appointments  Date Time Provider Department Center  03/06/2024  9:30 AM Cottle, Lorene KANDICE Raddle., MD [facility] None  03/06/2024  9:30 AM CP-NURSE [facility] None  03/08/2024  9:30 AM Cottle, Lorene KANDICE Raddle., MD [facility] None  03/08/2024  9:30 AM CP-NURSE [facility] None  03/15/2024  9:30 AM CP-NURSE [facility] None  03/15/2024  9:30 AM Mozingo, Angeline Mattocks, NP [facility] None  03/21/2024 11:30 AM Mozingo, Regina Nattalie, NP [facility] None  04/05/2024 11:00 AM Mozingo, Regina Nattalie, NP [facility] None  05/03/2024 12:00 PM Prentiss Riggs A, NP [facility] None    No orders of the defined types were placed in this encounter.   -------------------------------

## 2024-03-05 ENCOUNTER — Other Ambulatory Visit: Payer: Self-pay | Admitting: Adult Health

## 2024-03-05 DIAGNOSIS — F410 Panic disorder [episodic paroxysmal anxiety] without agoraphobia: Secondary | ICD-10-CM

## 2024-03-05 DIAGNOSIS — G4700 Insomnia, unspecified: Secondary | ICD-10-CM

## 2024-03-06 ENCOUNTER — Ambulatory Visit

## 2024-03-06 ENCOUNTER — Ambulatory Visit (INDEPENDENT_AMBULATORY_CARE_PROVIDER_SITE_OTHER): Admitting: Psychiatry

## 2024-03-06 ENCOUNTER — Encounter: Payer: Self-pay | Admitting: Psychiatry

## 2024-03-06 VITALS — BP 122/77 | HR 81

## 2024-03-06 DIAGNOSIS — F422 Mixed obsessional thoughts and acts: Secondary | ICD-10-CM

## 2024-03-06 DIAGNOSIS — F339 Major depressive disorder, recurrent, unspecified: Secondary | ICD-10-CM

## 2024-03-06 DIAGNOSIS — F410 Panic disorder [episodic paroxysmal anxiety] without agoraphobia: Secondary | ICD-10-CM

## 2024-03-06 DIAGNOSIS — G4700 Insomnia, unspecified: Secondary | ICD-10-CM

## 2024-03-06 DIAGNOSIS — I10 Essential (primary) hypertension: Secondary | ICD-10-CM

## 2024-03-06 DIAGNOSIS — F411 Generalized anxiety disorder: Secondary | ICD-10-CM

## 2024-03-06 NOTE — Progress Notes (Signed)
 Gwendolyn Carrillo [id number] Nov 12, 1975 48 y.o.  Subjective:   Patient ID:  Gwendolyn Carrillo is a 48 y.o. (DOB January 25, 1976) female.  Chief Complaint:  Chief Complaint  Patient presents with   Follow-up   Depression   Anxiety    HPI Gwendolyn Carrillo presents to the office today for follow-up of treatment resistant major depression.  She also has a history of generalized anxiety disorder, OCD, and panic.  She describes a long history of depression and anxiety and has been unable to tolerate most antidepressants.  She is very eager to pursue Spravato for relief from her depression.    01/31/24 appt noted:  Her PHQ-9 score was 22 today.  She describes symptoms of anhedonia, low motivation, difficulty concentrating, poor productivity, difficulty being around people and wanting to isolate.  She is not suicidal but she has had hopelessness other than this opportunity to pursue Spravato.  She does not feel like she can function at work and is discussing leave of absence with her usual provider [employer], PNP. She was so excited to start treatment today that she had trouble sleeping last night. She received her first administration of Spravato at 56 mg today.  She had no untoward side effects such as nausea dizziness headache or vomiting.  She did have the expected dissociation but it was not scary.  In fact she experienced a relief from negative emotions such as depression and anxiety while under treatment. She was very drowsy and fell asleep.  She explained it as being due to being sleep deprived.  She is eager to continue the Spravato.  02/02/24 appt noted:  She received her second administration of Spravato at 56 mg today.  She had no untoward side effects such as nausea dizziness headache or vomiting.  She did have the expected dissociation but it was not scary.  In fact she experienced a relief from negative emotions such as depression and anxiety while under treatment. She was much less  drowsy with it.  Would like to increase dose as planned.  No SI.  Tolerating current tx plan.   02/14/24 appt noted: 2 Spravato 84 mg tx last week as planned Here for Spravato 84 mg today.  Expected dissociation well tolerated without NV, HA.  Some dizziness.  Good emotional relief from negativity immediately from Spravato.  Med: no fluoxetine  bc not tolerated Mood sig better by most in decades with Spravato.  Dep is much better.  Anxiety is better too.  Sleep is ok.  Wants to continue tx.  02/16/24 appt noted:  No psych meds bc not tolerated. Here for Spravato 84 mg today.  Expected dissociation well tolerated without NV, HA.  Some dizziness.  Good emotional relief from negativity immediately from Spravato.  Mood so far dramatically better and anxiety better with Spravato.  No new problems.  Wants to continue Spravato.  02/21/24 appt noted:  No psych meds bc not tolerated. Here for Spravato 84 mg today.  Expected dissociation well tolerated without NV, HA.  Some dizziness.  Good emotional relief from negativity immediately from Spravato.  Dep has gone into remission Spravato and anxiety is much better .  She is very happy with the tx plan.  No new concerns.    02/28/24 appt noted:  No psych meds bc not tolerated. Here for Spravato 84 mg today.  Expected dissociation well tolerated without NV, HA.  Some dizziness.  Good emotional relief from negativity immediately from Spravato.  Dep unexpectedly worse Saturday and  since.  Having experienced relief she doesn't want to go backwards. There are some stressors contributing to it but she wants to continue Spravato twice weekly until she gets better.  03/01/24 appt noted: No psych meds bc not tolerated. Here for Spravato 84 mg today.  Expected dissociation well tolerated without NV, HA.  Some dizziness.  Good emotional relief from negativity immediately from Spravato.  Felt need bc relapse dep to increase Spravato back to twice weekly.   Feels  better about getting second spravato this week.  She believes it will help get her out of the dep like she was before.  Wants to avoid psych med bc hx intolerance.  BP was high this am and required clonidine 0.1 mg which resolved BP px.  She will discuss with PDP later this month.  03/06/24 appt noted: No psych meds bc not tolerated. Here for Spravato 84 mg today.  Expected dissociation well tolerated without NV, HA.  Some dizziness.  Good emotional relief from negativity immediately from Spravato.  Had hypertension this am prior ot Spravato and was treated with clonidine 0.1 mg which worked.  Results in nurses note. She will talk with PCP on Friday about this bc it has happened several times. Felt need bc relapse dep to increase Spravato back to twice weekly.  But dep is improving again.  Feels satisfied with tx and wants to continue current plan.    Previous medications: Zoloft , fluoxetine , others,  Clonazepam    ECT-MADRS    Flowsheet Row Video Visit from 01/05/2024 in [name]  MADRS Total Score 31   GAD-7    Flowsheet Row Office Visit from 08/31/2022 in [facility] Office Visit from 03/03/2022 in [facility] Office Visit from 11/25/2021 in [facility] Office Visit from 08/12/2021 in [facility] Office Visit from 04/01/2021 in [facility]  Total GAD-7 Score 12 14 5 8 16    PHQ2-9    Flowsheet Row Video Visit from 01/05/2024 in [name] Office Visit from 08/31/2022 in [facility] Office Visit from 03/03/2022 in [facility] Office Visit from 02/10/2022 in [facility] Office Visit from 11/25/2021 in [facility]  PHQ-2 Total Score 4 4 6 5 2   PHQ-9 Total Score -- 17 14 13 8    Flowsheet Row  ED from 05/26/2023 in [facility] ED from 12/11/2022 in [facility] Emergency Department at [facility] ED from 05/05/2022 in [facility] Emergency Department at [facility]  C-SSRS RISK CATEGORY No Risk No Risk No Risk     Review of Systems:  Review of Systems  Cardiovascular:  Negative for chest pain and palpitations.  Gastrointestinal:  Negative for nausea and vomiting.  Neurological:  Negative for dizziness, weakness and headaches.  Psychiatric/Behavioral:  Positive for dysphoric mood. Negative for decreased concentration and sleep disturbance. The patient is nervous/anxious.     Medications: I have reviewed the patient's current medications.  Current Outpatient Medications  Medication Sig Dispense Refill   ALPRAZolam  (XANAX ) 1 MG tablet TAKE 1 TABLET BY MOUTH 2 TIMES DAILY AS NEEDED FOR ANXIETY. 60 tablet 0   atorvastatin  (LIPITOR) 80 MG tablet Take 1 tablet (80 mg total) by mouth daily. 90 tablet 1   Blood Glucose Monitoring Suppl (ONETOUCH VERIO) w/Device KIT USE TO CHECK BLOOD SUGAR THREE TIMES  DAILY. 1 kit 0   Cholecalciferol (VITAMIN D3) 50 MCG (2000 UT) capsule Take 1 capsule (2,000 Units total) by mouth daily. 90 capsule 1   clindamycin  (CLEOCIN ) 2 % vaginal cream Place 1 Applicatorful vaginally at bedtime. 40 g 0   Continuous Blood Gluc Receiver (DEXCOM G6 RECEIVER) DEVI 1 Bag by Does not apply route in the morning, at noon, and at bedtime. 1 each 0   Continuous Glucose Sensor (DEXCOM G6 SENSOR) MISC USE AS DIRECTED EVERY 10 DAYS 9 each 2   Continuous Glucose Transmitter (DEXCOM G6 TRANSMITTER) MISC USE AS DIRECTED OVER 90 DAYS 1 each 2   Continuous Glucose Transmitter (DEXCOM G6 TRANSMITTER) MISC USE AS DIRECTED OVER 90 DAYS     cyclobenzaprine  (FLEXERIL ) 10 MG tablet Take 1 tablet (10 mg total) by mouth 2 (two) times daily as needed for muscle spasms. 12 tablet 0   Elastic Bandages & Supports (ELBOW COMPRESSION) MISC 1  Application by Does not apply route daily. 1 each 0   glucose blood (ONETOUCH VERIO) test strip USE TO CHECK BLOOD SUGAR THREE TIMES DAILY. 100 each 2   hydrochlorothiazide  (HYDRODIURIL ) 25 MG tablet Take 1 tablet (25 mg total) by mouth daily. 90 tablet 1   hydrOXYzine  (ATARAX ) 25 MG tablet Take 1 tablet (25 mg total) by mouth 3 (three) times daily as needed. 90 tablet 1   Insulin  Glargine (BASAGLAR  KWIKPEN) 100 UNIT/ML Inject 15 Units into the skin daily. 15 mL 0   insulin  lispro (HUMALOG  KWIKPEN) 100 UNIT/ML KwikPen INJECT up to 12u INTO THE SKIN 3 TIMES DAILY WITH MEALS per sliding scale: 200-250: 4 UNITS, 251-300: 6 UNITS, 301-350: 8 UNITS, 351-400: 10 UNITS,>400:12 UNITS AND CALL NP. Max daily dose: 36u 30 mL 0   Insulin  Pen Needle (PEN NEEDLES) 32G X 5 MM MISC Use to inject sliding scale Humalog  three times daily. 100 each 6   JARDIANCE  10 MG TABS tablet Take 10 mg by mouth daily.     lidocaine  (LIDODERM ) 5 % Place 1 patch onto the skin daily. Remove & Discard patch within 12 hours or as directed by MD 30 patch 0   LINZESS  145 MCG CAPS capsule Take 1 capsule (145 mcg total) by mouth daily as needed (constipation). 90 capsule 1   lisinopril  (ZESTRIL ) 20 MG tablet Take 1 tablet (20 mg total) by mouth daily. 90 tablet 1   metFORMIN (GLUCOPHAGE) 500 MG tablet Take 500 mg by mouth daily with breakfast.     metoCLOPramide  (REGLAN ) 10 MG tablet Take 10 mg by mouth every 6 (six) hours as needed.     metoprolol  tartrate (LOPRESSOR ) 25 MG tablet TAKE 1 TABLET BY MOUTH TWICE A DAY 60 tablet 0   norethindrone  (MICRONOR ) 0.35 MG tablet Take 1 tablet (0.35 mg total) by mouth daily. 28 tablet 0   omeprazole  (PRILOSEC) 20 MG capsule TAKE 1 CAPSULE BY MOUTH DAILY IF NEEDED FOR GASTRIC REFLUX 90 capsule 0   ondansetron  (ZOFRAN -ODT) 4 MG disintegrating tablet TAKE 1 TABLET BY MOUTH EVERY 8 HOURS AS NEEDED FOR NAUSEA AND VOMITING 12 tablet 0   OneTouch Delica Lancets 33G MISC USE TO CHECK BLOOD SUGAR THREE TIMES  DAILY. 100 each 2   telmisartan (MICARDIS) 80 MG tablet Take 1 tablet by mouth daily.     No current facility-administered medications for this visit.    Medication Side Effects: None  Allergies: Not on File  Past Medical History:  Diagnosis Date   Anxiety    Diabetes mellitus without complication (  HCC)    Gastroparesis    Hypertension    Major depressive disorder     Past Medical History, Surgical history, Social history, and Family history were reviewed and updated as appropriate.   Please see review of systems for further details on the patient's review from today.   Objective:   Physical Exam:  There were no vitals taken for this visit.  Physical Exam Constitutional:      General: She is not in acute distress. Musculoskeletal:        General: No deformity.  Neurological:     Mental Status: She is alert and oriented to person, place, and time.     Coordination: Coordination normal.  Psychiatric:        Attention and Perception: Attention and perception normal. She does not perceive auditory or visual hallucinations.        Mood and Affect: Mood is anxious and depressed. Affect is not blunt, angry, tearful or inappropriate.        Speech: Speech normal.        Behavior: Behavior normal.        Thought Content: Thought content normal. Thought content is not paranoid or delusional. Thought content does not include homicidal or suicidal ideation. Thought content does not include suicidal plan.        Cognition and Memory: Cognition and memory normal.        Judgment: Judgment normal.     Comments: Insight intact Dep improving again     Lab Review:     Component Value Date/Time   NA 131 (L) 05/25/2023 2132   NA 141 08/31/2022 0954   K 3.5 05/25/2023 2132   CL 94 (L) 05/25/2023 2132   CO2 25 05/25/2023 2132   GLUCOSE 314 (H) 05/25/2023 2132   BUN 10 05/25/2023 2132   BUN 12 08/31/2022 0954   CREATININE 0.87 05/25/2023 2132   CALCIUM  9.5 05/25/2023 2132    PROT 8.4 (H) 05/25/2023 2132   PROT 7.6 08/31/2022 0954   ALBUMIN 4.4 05/25/2023 2132   ALBUMIN 4.3 08/31/2022 0954   AST 71 (H) 05/25/2023 2132   ALT 86 (H) 05/25/2023 2132   ALKPHOS 70 05/25/2023 2132   BILITOT 1.0 05/25/2023 2132   BILITOT 0.3 08/31/2022 0954   GFRNONAA >60 05/25/2023 2132   GFRAA >60 10/10/2019 1441       Component Value Date/Time   WBC 11.8 (H) 05/25/2023 2132   RBC 4.51 05/25/2023 2132   HGB 15.1 (H) 05/25/2023 2132   HGB 13.9 08/31/2022 0954   HCT 42.4 05/25/2023 2132   HCT 41.0 08/31/2022 0954   PLT 187 05/25/2023 2132   PLT 249 08/31/2022 0954   MCV 94.0 05/25/2023 2132   MCV 96 08/31/2022 0954   MCH 33.5 05/25/2023 2132   MCHC 35.6 05/25/2023 2132   RDW 12.5 05/25/2023 2132   RDW 12.6 08/31/2022 0954   LYMPHSABS 0.5 (L) 05/25/2023 2132   LYMPHSABS 2.3 08/31/2022 0954   MONOABS 0.5 05/25/2023 2132   EOSABS 0.2 05/25/2023 2132   EOSABS 0.3 08/31/2022 0954   BASOSABS 0.0 05/25/2023 2132   BASOSABS 0.1 08/31/2022 0954    No results found for: POCLITH, LITHIUM   No results found for: PHENYTOIN, PHENOBARB, VALPROATE, CBMZ   .res Assessment: Plan:    Gwendolyn Carrillo was seen today for follow-up, depression and anxiety.  Diagnoses and all orders for this visit:  Recurrent major depression resistant to treatment  Generalized anxiety disorder  Panic attacks  Mixed obsessional  thoughts and acts  Insomnia, unspecified type  Primary hypertension    Disc her TRD and the SE Spravato.  She is very excited to be receiving this bc poor tolerance AD and no sig benefit with AD. Markedly better with Spravato and then this weekend recurrence.  Patient was administered Spravato 84 mg intranasally today.  The patient experienced the typical dissociation which gradually resolved over the 2-hour period of observation.  There were no complications.  Specifically the patient did not have nausea or vomiting or headache.  Blood pressures remained  within normal ranges at the 40-minute and 2-hour follow-up intervals.  By the time the 2-hour observation period was met the patient was alert and oriented and able to exit without assistance.  Patient feels the Spravato administration is helpful for the treatment resistant depression and would like to continue the treatment.  See nursing note for further details.  BP issues.  She will discuss with PCP.  Reqwuires clonidine 0.1 mg prn in office and tolerated well.  Continue  84 mg Spravato bc helps and well tolerated.      No med changes.  Consider alternatives to traditional AD given hx intolerance.  This might help reduce relapse risk.  Example probiotics, Saffron, low dose lithium  Follow-up twice weekly for Spravato administration  Lorene Macintosh MD, DFAPA  Please see After Visit Summary for patient specific instructions.  Future Appointments  Date Time Provider Department Center  03/08/2024  9:30 AM Cottle, Lorene KANDICE Raddle., MD [facility] None  03/08/2024  9:30 AM CP-NURSE [facility] None  03/15/2024  9:30 AM CP-NURSE [facility] None  03/15/2024  9:30 AM Mozingo, Angeline Mattocks, NP [facility] None  03/21/2024 11:30 AM Mozingo, Regina Nattalie, NP [facility] None  04/05/2024 11:00 AM Mozingo, Regina Nattalie, NP [facility] None  05/03/2024 12:00 PM Prentiss Riggs A, NP [facility] None    No orders of the defined types were placed in this encounter.   -------------------------------

## 2024-03-06 NOTE — Progress Notes (Signed)
 NURSES NOTE:         Pt arrived for her 41 th Spravato Treatment for treatment resistant depression, pt will be getting 84 mg (3 of the 28 mg inhalers), for continued maintenance dose. Pt sees [employer] and she will follow up with her for office visits and see her at treatment when she is in the office. Explained to pt how the treatments would be scheduled and answered any questions she had today. Pt's Spravato is billed through buy and bill medically.  Spravato medication is stored at treatment center per REMS/FDA guidelines. The medication is required to be locked behind two doors per REMS/FDA protocol. Medication is also disposed of properly after each use per regulations. All documentation for REMS is completed and submitted per FDA/REMS requirements.          Began taking patient's vital signs at 9:25 AM 159/93, pulse 77, SpO2 Sat. 97%, 161/99, pulse 78. Pt given Clonidine 0.1 mg at 9:32 AM. Treatment started at 9:36 AM. Dr. Geoffry aware pt given Clonidine again today prior to starting her treatment. Pt reports she has an apt with her PCP on Friday. Instructed patient to blow her nose if needed then recline back to a 45 degree angle. Gave patient first dose 28 mg nasal spray, administered in each nostril as directed and observed by nurse, waited 5 more minutes for the second and third doses. After all doses given pt did not complain of any nausea/vomiting, pt did have a drink to help with the taste of Spravato it gives the metallic taste. Assessed her 40 minute vitals, 10:20 AM, 148/84, pulse 84, SpO2 96%.  Explained she would be monitored for a total time of 120 minutes after beginning treatment. Discharge vitals were taken at 11:38 AM 122/77, P 81, SpO2 97%. Dr. Geoffry came to visit with patient once her thoughts were clearer to discuss how treatment went. Recommend she go home and sleep or just relax on the couch. No driving, no intense activities. Verbalized understanding. Pt will return  Tuesdays and Thursdays. Nurse was with pt a total of 60 minutes for clinical assessment.  Pt will get 84 mg.   (84 mg)  LOT [id number] EXP [id number]

## 2024-03-08 ENCOUNTER — Ambulatory Visit

## 2024-03-08 ENCOUNTER — Ambulatory Visit (INDEPENDENT_AMBULATORY_CARE_PROVIDER_SITE_OTHER): Admitting: Psychiatry

## 2024-03-08 VITALS — BP 127/99 | HR 94

## 2024-03-08 DIAGNOSIS — F422 Mixed obsessional thoughts and acts: Secondary | ICD-10-CM

## 2024-03-08 DIAGNOSIS — F339 Major depressive disorder, recurrent, unspecified: Secondary | ICD-10-CM

## 2024-03-08 DIAGNOSIS — F410 Panic disorder [episodic paroxysmal anxiety] without agoraphobia: Secondary | ICD-10-CM

## 2024-03-08 DIAGNOSIS — F411 Generalized anxiety disorder: Secondary | ICD-10-CM

## 2024-03-08 DIAGNOSIS — G4700 Insomnia, unspecified: Secondary | ICD-10-CM

## 2024-03-08 NOTE — Progress Notes (Signed)
 NURSES NOTE:         Pt arrived for her 36 th Spravato Treatment for treatment resistant depression, pt will be getting 84 mg (3 of the 28 mg inhalers), for continued maintenance dose. Pt sees [employer] and she will follow up with her for office visits and see her at treatment when she is in the office. Explained to pt how the treatments would be scheduled and answered any questions she had today. Pt's Spravato is billed through buy and bill medically.  Spravato medication is stored at treatment center per REMS/FDA guidelines. The medication is required to be locked behind two doors per REMS/FDA protocol. Medication is also disposed of properly after each use per regulations. All documentation for REMS is completed and submitted per FDA/REMS requirements.          Began taking patient's vital signs at 9:35 AM 137/85, pulse 78, SpO2 Sat. 97%. Pt stable to start her treatment. Instructed patient to blow her nose if needed then recline back to a 45 degree angle. Gave patient first dose 28 mg nasal spray, administered in each nostril as directed and observed by nurse, waited 5 more minutes for the second and third doses. After all doses given pt did not complain of any nausea/vomiting, pt did have a drink to help with the taste of Spravato it gives the metallic taste. Assessed her 40 minute vitals, 10:25 AM, 140/91, pulse 84, SpO2 97%.  Explained she would be monitored for a total time of 120 minutes after beginning treatment. Discharge vitals were taken at 11:34 AM 122/99, P 94, SpO2 95%. Dr. Geoffry came to visit with patient once her thoughts were clearer to discuss how treatment went. Recommend she go home and sleep or just relax on the couch. No driving, no intense activities. Verbalized understanding. Pt will return Tuesdays and Thursdays. Angeline is in office next week and will follow pt. Nurse was with pt a total of 60 minutes for clinical assessment.  Pt will get 84 mg.   (84 mg)  LOT  [id number] EXP [id number]

## 2024-03-09 ENCOUNTER — Encounter: Payer: Self-pay | Admitting: Psychiatry

## 2024-03-09 NOTE — Progress Notes (Signed)
 Gwendolyn Carrillo [id number] August 10, 1975 48 y.o.  Subjective:   Patient ID:  Gwendolyn Carrillo is a 48 y.o. (DOB Sep 02, 1975) female.  Chief Complaint:  Chief Complaint  Patient presents with   Follow-up   Depression   Anxiety   Sleeping Problem    HPI Gwendolyn Carrillo presents to the office today for follow-up of treatment resistant major depression.  She also has a history of generalized anxiety disorder, OCD, and panic.  She describes a long history of depression and anxiety and has been unable to tolerate most antidepressants.  She is very eager to pursue Spravato for relief from her depression.    01/31/24 appt noted:  Her PHQ-9 score was 22 today.  She describes symptoms of anhedonia, low motivation, difficulty concentrating, poor productivity, difficulty being around people and wanting to isolate.  She is not suicidal but she has had hopelessness other than this opportunity to pursue Spravato.  She does not feel like she can function at work and is discussing leave of absence with her usual provider [employer], PNP. She was so excited to start treatment today that she had trouble sleeping last night. She received her first administration of Spravato at 56 mg today.  She had no untoward side effects such as nausea dizziness headache or vomiting.  She did have the expected dissociation but it was not scary.  In fact she experienced a relief from negative emotions such as depression and anxiety while under treatment. She was very drowsy and fell asleep.  She explained it as being due to being sleep deprived.  She is eager to continue the Spravato.  02/02/24 appt noted:  She received her second administration of Spravato at 56 mg today.  She had no untoward side effects such as nausea dizziness headache or vomiting.  She did have the expected dissociation but it was not scary.  In fact she experienced a relief from negative emotions such as depression and anxiety while under  treatment. She was much less drowsy with it.  Would like to increase dose as planned.  No SI.  Tolerating current tx plan.   02/14/24 appt noted: 2 Spravato 84 mg tx last week as planned Here for Spravato 84 mg today.  Expected dissociation well tolerated without NV, HA.  Some dizziness.  Good emotional relief from negativity immediately from Spravato.  Med: no fluoxetine  bc not tolerated Mood sig better by most in decades with Spravato.  Dep is much better.  Anxiety is better too.  Sleep is ok.  Wants to continue tx.  02/16/24 appt noted:  No psych meds bc not tolerated. Here for Spravato 84 mg today.  Expected dissociation well tolerated without NV, HA.  Some dizziness.  Good emotional relief from negativity immediately from Spravato.  Mood so far dramatically better and anxiety better with Spravato.  No new problems.  Wants to continue Spravato.  02/21/24 appt noted:  No psych meds bc not tolerated. Here for Spravato 84 mg today.  Expected dissociation well tolerated without NV, HA.  Some dizziness.  Good emotional relief from negativity immediately from Spravato.  Dep has gone into remission Spravato and anxiety is much better .  She is very happy with the tx plan.  No new concerns.    02/28/24 appt noted:  No psych meds bc not tolerated. Here for Spravato 84 mg today.  Expected dissociation well tolerated without NV, HA.  Some dizziness.  Good emotional relief from negativity immediately from Spravato.  Dep  unexpectedly worse Saturday and since.  Having experienced relief she doesn't want to go backwards. There are some stressors contributing to it but she wants to continue Spravato twice weekly until she gets better.  03/01/24 appt noted: No psych meds bc not tolerated. Here for Spravato 84 mg today.  Expected dissociation well tolerated without NV, HA.  Some dizziness.  Good emotional relief from negativity immediately from Spravato.  Felt need bc relapse dep to increase Spravato back  to twice weekly.   Feels better about getting second spravato this week.  She believes it will help get her out of the dep like she was before.  Wants to avoid psych med bc hx intolerance.  BP was high this am and required clonidine 0.1 mg which resolved BP px.  She will discuss with PDP later this month.  03/06/24 appt noted: No psych meds bc not tolerated. Here for Spravato 84 mg today.  Expected dissociation well tolerated without NV, HA.  Some dizziness.  Good emotional relief from negativity immediately from Spravato.  Had hypertension this am prior ot Spravato and was treated with clonidine 0.1 mg which worked.  Results in nurses note. She will talk with PCP on Friday about this bc it has happened several times. Felt need bc relapse dep to increase Spravato back to twice weekly.  But dep is improving again.  Feels satisfied with tx and wants to continue current plan.  03/08/24 appt noted: No psych meds bc not tolerated. Here for Spravato 84 mg today.  Expected dissociation well tolerated without NV, HA.  Some dizziness.  Good emotional relief from negativity immediately from Spravato.  BP ok today;  Results in nurses note. She will talk with PCP on Friday about this bc it has happened several times. Dep and anxiety are much better with Spravato and greatly relieved at the benefit for the first time in many years.  Previous medications: Zoloft , fluoxetine , others,  Clonazepam    ECT-MADRS    Flowsheet Row Video Visit from 01/05/2024 in [name]  MADRS Total Score 31   GAD-7    Flowsheet Row Office Visit from 08/31/2022 in [facility] Office Visit from 03/03/2022 in [facility] Office Visit from 11/25/2021 in [facility] Office Visit from 08/12/2021 in [facility] Office Visit from 04/01/2021 in [facility]  Total GAD-7 Score  12 14 5 8 16    PHQ2-9    Flowsheet Row Video Visit from 01/05/2024 in [name] Office Visit from 08/31/2022 in [facility] Office Visit from 03/03/2022 in [facility] Office Visit from 02/10/2022 in [facility] Office Visit from 11/25/2021 in [facility]  PHQ-2 Total Score 4 4 6 5 2   PHQ-9 Total Score -- 17 14 13 8    Flowsheet Row ED from 05/26/2023 in [facility] Emergency Department at [facility] ED from 12/11/2022 in [facility] Emergency Department at [facility] ED from 05/05/2022 in [facility] Emergency Department at [facility]  C-SSRS RISK CATEGORY No Risk No Risk No Risk     Review of Systems:  Review of Systems  Cardiovascular:  Negative for chest pain and palpitations.  Gastrointestinal:  Negative for nausea and vomiting.  Neurological:  Negative for dizziness, weakness and headaches.  Psychiatric/Behavioral:  Negative for decreased concentration, dysphoric mood and sleep disturbance. The patient  is nervous/anxious.     Medications: I have reviewed the patient's current medications.  Current Outpatient Medications  Medication Sig Dispense Refill   ALPRAZolam  (XANAX ) 1 MG tablet TAKE 1 TABLET BY MOUTH 2 TIMES DAILY AS NEEDED FOR ANXIETY. 60 tablet 0   atorvastatin  (LIPITOR) 80 MG tablet Take 1 tablet (80 mg total) by mouth daily. 90 tablet 1   Blood Glucose Monitoring Suppl (ONETOUCH VERIO) w/Device KIT USE TO CHECK BLOOD SUGAR THREE TIMES DAILY. 1 kit 0   Cholecalciferol (VITAMIN D3) 50 MCG (2000 UT) capsule Take 1 capsule (2,000 Units total) by mouth daily. 90 capsule 1   clindamycin  (CLEOCIN ) 2 % vaginal cream Place 1 Applicatorful vaginally at bedtime. 40 g 0   Continuous Blood Gluc Receiver (DEXCOM G6 RECEIVER) DEVI 1 Bag by Does not apply route in the morning, at noon, and at bedtime. 1 each 0   Continuous  Glucose Sensor (DEXCOM G6 SENSOR) MISC USE AS DIRECTED EVERY 10 DAYS 9 each 2   Continuous Glucose Transmitter (DEXCOM G6 TRANSMITTER) MISC USE AS DIRECTED OVER 90 DAYS 1 each 2   Continuous Glucose Transmitter (DEXCOM G6 TRANSMITTER) MISC USE AS DIRECTED OVER 90 DAYS     cyclobenzaprine  (FLEXERIL ) 10 MG tablet Take 1 tablet (10 mg total) by mouth 2 (two) times daily as needed for muscle spasms. 12 tablet 0   Elastic Bandages & Supports (ELBOW COMPRESSION) MISC 1 Application by Does not apply route daily. 1 each 0   glucose blood (ONETOUCH VERIO) test strip USE TO CHECK BLOOD SUGAR THREE TIMES DAILY. 100 each 2   hydrochlorothiazide  (HYDRODIURIL ) 25 MG tablet Take 1 tablet (25 mg total) by mouth daily. 90 tablet 1   hydrOXYzine  (ATARAX ) 25 MG tablet Take 1 tablet (25 mg total) by mouth 3 (three) times daily as needed. 90 tablet 1   Insulin  Glargine (BASAGLAR  KWIKPEN) 100 UNIT/ML Inject 15 Units into the skin daily. 15 mL 0   insulin  lispro (HUMALOG  KWIKPEN) 100 UNIT/ML KwikPen INJECT up to 12u INTO THE SKIN 3 TIMES DAILY WITH MEALS per sliding scale: 200-250: 4 UNITS, 251-300: 6 UNITS, 301-350: 8 UNITS, 351-400: 10 UNITS,>400:12 UNITS AND CALL NP. Max daily dose: 36u 30 mL 0   Insulin  Pen Needle (PEN NEEDLES) 32G X 5 MM MISC Use to inject sliding scale Humalog  three times daily. 100 each 6   JARDIANCE  10 MG TABS tablet Take 10 mg by mouth daily.     lidocaine  (LIDODERM ) 5 % Place 1 patch onto the skin daily. Remove & Discard patch within 12 hours or as directed by MD 30 patch 0   LINZESS  145 MCG CAPS capsule Take 1 capsule (145 mcg total) by mouth daily as needed (constipation). 90 capsule 1   lisinopril  (ZESTRIL ) 20 MG tablet Take 1 tablet (20 mg total) by mouth daily. 90 tablet 1   metFORMIN (GLUCOPHAGE) 500 MG tablet Take 500 mg by mouth daily with breakfast.     metoCLOPramide  (REGLAN ) 10 MG tablet Take 10 mg by mouth every 6 (six) hours as needed.     metoprolol  tartrate (LOPRESSOR ) 25 MG  tablet TAKE 1 TABLET BY MOUTH TWICE A DAY 60 tablet 0   norethindrone  (MICRONOR ) 0.35 MG tablet Take 1 tablet (0.35 mg total) by mouth daily. 28 tablet 0   omeprazole  (PRILOSEC) 20 MG capsule TAKE 1 CAPSULE BY MOUTH DAILY IF NEEDED FOR GASTRIC REFLUX 90 capsule 0   ondansetron  (ZOFRAN -ODT) 4 MG disintegrating tablet TAKE 1 TABLET BY MOUTH EVERY 8  HOURS AS NEEDED FOR NAUSEA AND VOMITING 12 tablet 0   OneTouch Delica Lancets 33G MISC USE TO CHECK BLOOD SUGAR THREE TIMES DAILY. 100 each 2   telmisartan (MICARDIS) 80 MG tablet Take 1 tablet by mouth daily.     No current facility-administered medications for this visit.    Medication Side Effects: None  Allergies: Not on File  Past Medical History:  Diagnosis Date   Anxiety    Diabetes mellitus without complication (HCC)    Gastroparesis    Hypertension    Major depressive disorder     Past Medical History, Surgical history, Social history, and Family history were reviewed and updated as appropriate.   Please see review of systems for further details on the patient's review from today.   Objective:   Physical Exam:  There were no vitals taken for this visit.  Physical Exam Constitutional:      General: She is not in acute distress. Musculoskeletal:        General: No deformity.  Neurological:     Mental Status: She is alert and oriented to person, place, and time.     Coordination: Coordination normal.  Psychiatric:        Attention and Perception: Attention and perception normal. She does not perceive auditory or visual hallucinations.        Mood and Affect: Mood is anxious. Mood is not depressed. Affect is not blunt, angry, tearful or inappropriate.        Speech: Speech normal.        Behavior: Behavior normal.        Thought Content: Thought content normal. Thought content is not paranoid or delusional. Thought content does not include homicidal or suicidal ideation. Thought content does not include suicidal plan.         Cognition and Memory: Cognition and memory normal.        Judgment: Judgment normal.     Comments: Insight intact Dep improving again     Lab Review:     Component Value Date/Time   NA 131 (L) 05/25/2023 2132   NA 141 08/31/2022 0954   K 3.5 05/25/2023 2132   CL 94 (L) 05/25/2023 2132   CO2 25 05/25/2023 2132   GLUCOSE 314 (H) 05/25/2023 2132   BUN 10 05/25/2023 2132   BUN 12 08/31/2022 0954   CREATININE 0.87 05/25/2023 2132   CALCIUM  9.5 05/25/2023 2132   PROT 8.4 (H) 05/25/2023 2132   PROT 7.6 08/31/2022 0954   ALBUMIN 4.4 05/25/2023 2132   ALBUMIN 4.3 08/31/2022 0954   AST 71 (H) 05/25/2023 2132   ALT 86 (H) 05/25/2023 2132   ALKPHOS 70 05/25/2023 2132   BILITOT 1.0 05/25/2023 2132   BILITOT 0.3 08/31/2022 0954   GFRNONAA >60 05/25/2023 2132   GFRAA >60 10/10/2019 1441       Component Value Date/Time   WBC 11.8 (H) 05/25/2023 2132   RBC 4.51 05/25/2023 2132   HGB 15.1 (H) 05/25/2023 2132   HGB 13.9 08/31/2022 0954   HCT 42.4 05/25/2023 2132   HCT 41.0 08/31/2022 0954   PLT 187 05/25/2023 2132   PLT 249 08/31/2022 0954   MCV 94.0 05/25/2023 2132   MCV 96 08/31/2022 0954   MCH 33.5 05/25/2023 2132   MCHC 35.6 05/25/2023 2132   RDW 12.5 05/25/2023 2132   RDW 12.6 08/31/2022 0954   LYMPHSABS 0.5 (L) 05/25/2023 2132   LYMPHSABS 2.3 08/31/2022 0954   MONOABS 0.5 05/25/2023 2132  EOSABS 0.2 05/25/2023 2132   EOSABS 0.3 08/31/2022 0954   BASOSABS 0.0 05/25/2023 2132   BASOSABS 0.1 08/31/2022 0954    No results found for: POCLITH, LITHIUM   No results found for: PHENYTOIN, PHENOBARB, VALPROATE, CBMZ   .res Assessment: Plan:    Chanee was seen today for follow-up, depression, anxiety and sleeping problem.  Diagnoses and all orders for this visit:  Recurrent major depression resistant to treatment  Generalized anxiety disorder  Panic attacks  Mixed obsessional thoughts and acts  Insomnia, unspecified type    Disc her TRD and the  SE Spravato.  She is very excited to be receiving this bc poor tolerance AD and no sig benefit with AD. Markedly better with Spravato and then this weekend recurrence.  Patient was administered Spravato 84 mg intranasally today.  The patient experienced the typical dissociation which gradually resolved over the 2-hour period of observation.  There were no complications.  Specifically the patient did not have nausea or vomiting or headache.  Blood pressures remained within normal ranges at the 40-minute and 2-hour follow-up intervals.  By the time the 2-hour observation period was met the patient was alert and oriented and able to exit without assistance.  Patient feels the Spravato administration is helpful for the treatment resistant depression and would like to continue the treatment.  See nursing note for further details.  BP issues.  She will discuss with PCP.  Reqwuires clonidine 0.1 mg prn in office and tolerated well.  Continue  84 mg Spravato bc helps and well tolerated.      No med changes.  Consider alternatives to traditional AD given hx intolerance.  This might help reduce relapse risk.  Example probiotics, Saffron, low dose lithium.  But will wait longer to bring this up in hopes she will become more comfortable with further med trials.  Follow-up twice weekly for Spravato administration  Lorene Macintosh MD, DFAPA  Please see After Visit Summary for patient specific instructions.  Future Appointments  Date Time Provider Department Center  03/13/2024  9:30 AM CP-NURSE [facility] None  03/13/2024  9:30 AM Mozingo, Angeline Mattocks, NP [facility] None  03/15/2024  9:30 AM CP-NURSE [facility] None  03/15/2024  9:30 AM Mozingo, Regina Nattalie, NP [facility] None  03/21/2024 11:30 AM Mozingo, Regina Nattalie, NP [facility] None  04/05/2024 11:00 AM Mozingo, Regina Nattalie, NP [facility] None  05/03/2024 12:00 PM Prentiss Riggs A, NP [facility] None    No orders of the defined types were placed in this  encounter.   -------------------------------

## 2024-03-13 ENCOUNTER — Ambulatory Visit: Admitting: Adult Health

## 2024-03-13 ENCOUNTER — Ambulatory Visit

## 2024-03-13 ENCOUNTER — Encounter: Payer: Self-pay | Admitting: Adult Health

## 2024-03-13 VITALS — BP 136/90 | HR 91

## 2024-03-13 DIAGNOSIS — F339 Major depressive disorder, recurrent, unspecified: Secondary | ICD-10-CM

## 2024-03-13 NOTE — Progress Notes (Signed)
 Gwendolyn Carrillo [id number] [birth year] 48 y.o.  Subjective:   Patient ID:  Gwendolyn Carrillo is a 48 y.o. (DOB [birth year]) female.  Chief Complaint: No chief complaint on file.   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   She also has a history of generalized anxiety disorder, OCD, and panic.   Gwendolyn Carrillo reports a positive experience with Spravato administration today. Describes mood today as improved with Spravato. Stating things are not as bad as they were. She is tolerating the 84mg  dose. She denied any untoward side effects today. Stating everything went good. Reports some recent setbacks, but is managing things better. Mood symptoms - she reports decreased depression after treatment today. She reports she is feeling an overall relief from depressive symptoms. She denied any anxiety and irritability today. Reports appetite adequate - weight stable. Reports sleeping better. Reports improved focus and concentration. Denies SI or HI.   Will plan on continuation of Spravato at 84mg  at next treatment.    ECT-MADRS    Flowsheet Row Video Visit from 01/05/2024 in [name]  MADRS Total Score 31   GAD-7    Flowsheet Row Office Visit from 08/31/2022 in [facility] Office Visit from 03/03/2022 in [facility] Office Visit from 11/25/2021 in [facility] Office Visit from 08/12/2021 in [facility] Office Visit from 04/01/2021 in [facility]  Total GAD-7 Score 12 14 5 8 16    PHQ2-9    Flowsheet Row Video Visit from 01/05/2024 in [name] Office Visit from 08/31/2022 in [facility] Office Visit from 03/03/2022 in [facility] Office Visit from 02/10/2022 in [facility] Office Visit from 11/25/2021 in [facility]  PHQ-2 Total Score 4 4 6 5 2   PHQ-9 Total Score -- 17 14 13 8    Flowsheet Row ED from 05/26/2023 in [facility] Emergency Department at [facility] ED from 12/11/2022 in [facility] Emergency Department at [facility] ED from 05/05/2022 in [facility] Emergency Department at [facility]  C-SSRS RISK CATEGORY No Risk No Risk No Risk     Review of Systems:  Review of Systems  Musculoskeletal:  Negative for gait problem.  Neurological:  Negative for tremors.  Psychiatric/Behavioral:         Please refer to HPI    Medications: I have reviewed the patient's current medications.  Current Outpatient Medications  Medication Sig Dispense Refill   ALPRAZolam  (XANAX ) 1 MG tablet TAKE 1 TABLET BY MOUTH 2 TIMES DAILY AS NEEDED FOR ANXIETY. 60 tablet 0   atorvastatin  (LIPITOR) 80 MG tablet Take 1 tablet (80 mg total) by mouth daily. 90 tablet 1   Blood Glucose Monitoring Suppl (ONETOUCH VERIO) w/Device KIT USE TO CHECK BLOOD SUGAR THREE TIMES DAILY. 1 kit 0   Cholecalciferol (VITAMIN D3) 50 MCG (2000 UT) capsule Take 1 capsule (2,000 Units total) by mouth daily. 90 capsule 1   clindamycin  (CLEOCIN ) 2 % vaginal cream Place 1 Applicatorful vaginally at bedtime. 40 g 0   Continuous Blood Gluc Receiver (DEXCOM G6 RECEIVER) DEVI 1 Bag by Does not apply route in the morning, at noon, and at bedtime. 1 each 0   Continuous Glucose Sensor (DEXCOM G6 SENSOR) MISC USE AS DIRECTED EVERY 10 DAYS 9 each 2  Continuous Glucose Transmitter (DEXCOM G6 TRANSMITTER) MISC USE AS DIRECTED OVER 90 DAYS 1 each 2   Continuous Glucose Transmitter (DEXCOM G6 TRANSMITTER) MISC USE AS DIRECTED OVER 90 DAYS     cyclobenzaprine  (FLEXERIL ) 10 MG tablet Take 1 tablet (10 mg total) by mouth 2 (two) times daily as needed for muscle spasms. 12 tablet 0   Elastic Bandages & Supports (ELBOW COMPRESSION) MISC 1 Application by Does  not apply route daily. 1 each 0   glucose blood (ONETOUCH VERIO) test strip USE TO CHECK BLOOD SUGAR THREE TIMES DAILY. 100 each 2   hydrochlorothiazide  (HYDRODIURIL ) 25 MG tablet Take 1 tablet (25 mg total) by mouth daily. 90 tablet 1   hydrOXYzine  (ATARAX ) 25 MG tablet Take 1 tablet (25 mg total) by mouth 3 (three) times daily as needed. 90 tablet 1   Insulin  Glargine (BASAGLAR  KWIKPEN) 100 UNIT/ML Inject 15 Units into the skin daily. 15 mL 0   insulin  lispro (HUMALOG  KWIKPEN) 100 UNIT/ML KwikPen INJECT up to 12u INTO THE SKIN 3 TIMES DAILY WITH MEALS per sliding scale: 200-250: 4 UNITS, 251-300: 6 UNITS, 301-350: 8 UNITS, 351-400: 10 UNITS,>400:12 UNITS AND CALL NP. Max daily dose: 36u 30 mL 0   Insulin  Pen Needle (PEN NEEDLES) 32G X 5 MM MISC Use to inject sliding scale Humalog  three times daily. 100 each 6   JARDIANCE  10 MG TABS tablet Take 10 mg by mouth daily.     lidocaine  (LIDODERM ) 5 % Place 1 patch onto the skin daily. Remove & Discard patch within 12 hours or as directed by MD 30 patch 0   LINZESS  145 MCG CAPS capsule Take 1 capsule (145 mcg total) by mouth daily as needed (constipation). 90 capsule 1   lisinopril  (ZESTRIL ) 20 MG tablet Take 1 tablet (20 mg total) by mouth daily. 90 tablet 1   metFORMIN (GLUCOPHAGE) 500 MG tablet Take 500 mg by mouth daily with breakfast.     metoCLOPramide  (REGLAN ) 10 MG tablet Take 10 mg by mouth every 6 (six) hours as needed.     metoprolol  tartrate (LOPRESSOR ) 25 MG tablet TAKE 1 TABLET BY MOUTH TWICE A DAY 60 tablet 0   norethindrone  (MICRONOR ) 0.35 MG tablet Take 1 tablet (0.35 mg total) by mouth daily. 28 tablet 0   omeprazole  (PRILOSEC) 20 MG capsule TAKE 1 CAPSULE BY MOUTH DAILY IF NEEDED FOR GASTRIC REFLUX 90 capsule 0   ondansetron  (ZOFRAN -ODT) 4 MG disintegrating tablet TAKE 1 TABLET BY MOUTH EVERY 8 HOURS AS NEEDED FOR NAUSEA AND VOMITING 12 tablet 0   OneTouch Delica Lancets 33G MISC USE TO CHECK BLOOD SUGAR THREE TIMES DAILY. 100 each 2    telmisartan (MICARDIS) 80 MG tablet Take 1 tablet by mouth daily.     No current facility-administered medications for this visit.    Medication Side Effects: None  Allergies: Not on File  Past Medical History:  Diagnosis Date   Anxiety    Diabetes mellitus without complication (HCC)    Gastroparesis    Hypertension    Major depressive disorder     Past Medical History, Surgical history, Social history, and Family history were reviewed and updated as appropriate.   Please see review of systems for further details on the patient's review from today.   Objective:   Physical Exam:  There were no vitals taken for this visit.  Physical Exam Constitutional:      General: She is not in acute distress. Musculoskeletal:  General: No deformity.  Neurological:     Mental Status: She is alert and oriented to person, place, and time.     Coordination: Coordination normal.  Psychiatric:        Attention and Perception: Attention and perception normal. She does not perceive auditory or visual hallucinations.        Mood and Affect: Mood normal. Mood is not anxious or depressed. Affect is not labile, blunt, angry or inappropriate.        Speech: Speech normal.        Behavior: Behavior normal.        Thought Content: Thought content normal. Thought content is not paranoid or delusional. Thought content does not include homicidal or suicidal ideation. Thought content does not include homicidal or suicidal plan.        Cognition and Memory: Cognition and memory normal.        Judgment: Judgment normal.     Comments: Insight intact     Lab Review:     Component Value Date/Time   NA 131 (L) 05/25/2023 2132   NA 141 08/31/2022 0954   K 3.5 05/25/2023 2132   CL 94 (L) 05/25/2023 2132   CO2 25 05/25/2023 2132   GLUCOSE 314 (H) 05/25/2023 2132   BUN 10 05/25/2023 2132   BUN 12 08/31/2022 0954   CREATININE 0.87 05/25/2023 2132   CALCIUM  9.5 05/25/2023 2132   PROT 8.4 (H)  05/25/2023 2132   PROT 7.6 08/31/2022 0954   ALBUMIN 4.4 05/25/2023 2132   ALBUMIN 4.3 08/31/2022 0954   AST 71 (H) 05/25/2023 2132   ALT 86 (H) 05/25/2023 2132   ALKPHOS 70 05/25/2023 2132   BILITOT 1.0 05/25/2023 2132   BILITOT 0.3 08/31/2022 0954   GFRNONAA >60 05/25/2023 2132   GFRAA >60 10/10/2019 1441       Component Value Date/Time   WBC 11.8 (H) 05/25/2023 2132   RBC 4.51 05/25/2023 2132   HGB 15.1 (H) 05/25/2023 2132   HGB 13.9 08/31/2022 0954   HCT 42.4 05/25/2023 2132   HCT 41.0 08/31/2022 0954   PLT 187 05/25/2023 2132   PLT 249 08/31/2022 0954   MCV 94.0 05/25/2023 2132   MCV 96 08/31/2022 0954   MCH 33.5 05/25/2023 2132   MCHC 35.6 05/25/2023 2132   RDW 12.5 05/25/2023 2132   RDW 12.6 08/31/2022 0954   LYMPHSABS 0.5 (L) 05/25/2023 2132   LYMPHSABS 2.3 08/31/2022 0954   MONOABS 0.5 05/25/2023 2132   EOSABS 0.2 05/25/2023 2132   EOSABS 0.3 08/31/2022 0954   BASOSABS 0.0 05/25/2023 2132   BASOSABS 0.1 08/31/2022 0954    No results found for: POCLITH, LITHIUM   No results found for: PHENYTOIN, PHENOBARB, VALPROATE, CBMZ   .res Assessment: Plan:    Plan:  PDMP reviewed  Xanax  1mg  BID as needed for anxiety and panic attacks Hydroxyzine  25mg  TID as needed Prozac  10mg  daily   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  Gene sight testing available.  Working with a paramedic.  RTC 2 days  Patient was observed by provider throughout Spravato treatment. The patient experienced the typical dissociation which gradually resolved over the 2-hour period of observation. There were no complications. Specifically, the patient did not have any untoward side effects - feeling disconnected from herself, her thoughts, feelings and things around her, dizziness, nausea, feeling sleepy, decreased feeling of sensitivity (numbness)spinning sensation, feeling anxious, lack of energy, increased blood pressure, nausea, vomiting, feeling drink, or  feeling happy or  very excited.nausea or vomiting or headache. Blood pressures remained within normal ranges at the 40-minute and 2-hour follow-up intervals. By the time the 2-hour observation period was met the patient was alert and oriented and able to exit without assistance.  Patient feels like the Spravato administration is helping with her treatment resistant depression and would like to continue treatment. See nursing note for further details.    No meds were changed today - her BP was controlled prior to and during the Spravato administration. She did receive an initial dose of Clonidine 0.1mg  for elevated BP prior to starting treatment. See the nursing note for those details. She is compliant with her outpatient blood pressure medicines and reports a recent check in with PCP related to BP.  Will continue 84 mg Spravato for upcoming treatments.     Follow-up twice weekly for Spravato administration.  There are no diagnoses linked to this encounter.   Please see After Visit Summary for patient specific instructions.  Future Appointments  Date Time Provider Department Center  03/15/2024  9:30 AM CP-NURSE [facility] None  03/15/2024  9:30 AM Nelson Noone, Angeline Mattocks, NP [facility] None  03/21/2024 11:30 AM Nicco Reaume Nattalie, NP [facility] None  04/05/2024 11:00 AM Thane Age Nattalie, NP [facility] None  05/03/2024 12:00 PM Prentiss Riggs A, NP [facility] None    No orders of the defined types were placed in this encounter.   -------------------------------

## 2024-03-13 NOTE — Progress Notes (Signed)
 NURSES NOTE:         Pt arrived for her 13th Spravato Treatment for treatment resistant depression, pt will be getting 84 mg (3 of the 28 mg inhalers), for continued maintenance dose. Pt sees [employer] and she will follow up with her for office visits and see her at treatment when she is in the office. Explained to pt how the treatments would be scheduled and answered any questions she had today. Pt's Spravato is billed through buy and bill medically.  Spravato medication is stored at treatment center per REMS/FDA guidelines. The medication is required to be locked behind two doors per REMS/FDA protocol. Medication is also disposed of properly after each use per regulations. All documentation for REMS is completed and submitted per FDA/REMS requirements.          Began taking patient's vital signs at 9:35 AM 157/99, 140/94 pulse 88, SpO2 Sat. 97%. Pt given Clonidine 0.1 mg at start of treatment. Instructed patient to blow her nose if needed then recline back to a 45 degree angle. Gave patient first dose 28 mg nasal spray, administered in each nostril as directed and observed by nurse, waited 5 more minutes for the second and third doses. After all doses given pt did not complain of any nausea/vomiting, pt did have a drink to help with the taste of Spravato it gives the metallic taste. Assessed her 40 minute vitals, 10:10 AM, 127/72, pulse 69, SpO2 97%.  Explained she would be monitored for a total time of 120 minutes after beginning treatment. Discharge vitals were taken at 11:34 AM 136/90, P 91, SpO2 97%. Angeline came to visit with patient once her thoughts were clearer to discuss how treatment went. Recommend she go home and sleep or just relax on the couch. No driving, no intense activities. Verbalized understanding. Pt will return Thursday. Nurse was with pt a total of 60 minutes for clinical assessment.  Pt will get 84 mg.   (84 mg)  LOT [id number] EXP [id number]

## 2024-03-15 ENCOUNTER — Ambulatory Visit

## 2024-03-15 ENCOUNTER — Encounter: Payer: Self-pay | Admitting: Adult Health

## 2024-03-15 ENCOUNTER — Ambulatory Visit (INDEPENDENT_AMBULATORY_CARE_PROVIDER_SITE_OTHER): Admitting: Adult Health

## 2024-03-15 VITALS — BP 133/98 | HR 84

## 2024-03-15 DIAGNOSIS — F339 Major depressive disorder, recurrent, unspecified: Secondary | ICD-10-CM

## 2024-03-15 NOTE — Progress Notes (Signed)
 NURSES NOTE:         Pt arrived for her 14th Spravato Treatment for treatment resistant depression, pt will be getting 84 mg (3 of the 28 mg inhalers), for continued maintenance dose. Pt sees [employer] and she will follow up with her for office visits and see her at treatment when she is in the office. Explained to pt how the treatments would be scheduled and answered any questions she had today. Pt's Spravato is billed through buy and bill medically.  Spravato medication is stored at treatment center per REMS/FDA guidelines. The medication is required to be locked behind two doors per REMS/FDA protocol. Medication is also disposed of properly after each use per regulations. All documentation for REMS is completed and submitted per FDA/REMS requirements.          Began taking patient's vital signs at 9:46 AM 131/88, pulse 81, SpO2 Sat. 97%. Instructed patient to blow her nose if needed then recline back to a 45 degree angle. Gave patient first dose 28 mg nasal spray, administered in each nostril as directed and observed by nurse, waited 5 more minutes for the second and third doses. After all doses given pt did not complain of any nausea/vomiting, pt did have a drink to help with the taste of Spravato it gives the metallic taste. Assessed her 40 minute vitals, 10:26 AM, 146/93, pulse 82, SpO2 96%.  Explained she would be monitored for a total time of 120 minutes after beginning treatment. Discharge vitals were taken at 11:38 AM 133/98, P 84, SpO2 96%. Angeline came to visit with patient once her thoughts were clearer to discuss how treatment went. Recommend she go home and sleep or just relax on the couch. No driving, no intense activities. Verbalized understanding. Pt will return Tuesday. Nurse was with pt a total of 60 minutes for clinical assessment.  Pt will get 84 mg.   (84 mg)  LOT [id number] EXP [id number]

## 2024-03-15 NOTE — Progress Notes (Signed)
 Gwendolyn Carrillo [id number] 05-02-1975 48 y.o.  Subjective:   Patient ID:  Gwendolyn Carrillo is a 48 y.o. (DOB Apr 23, 1976) female.  Chief Complaint: No chief complaint on file.   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   Patient was observed by provider throughout Spravato treatment. The patient experienced the typical dissociation which gradually resolved over the 2-hour period of observation. There were no complications. Specifically, the patient did not have any untoward side effects - feeling disconnected from herself, her thoughts, feelings and things around her, dizziness, nausea, feeling sleepy, decreased feeling of sensitivity (numbness)spinning sensation, feeling anxious, lack of energy, increased blood pressure, nausea, vomiting, feeling drink, or feeling happy or very excited.nausea or vomiting or headache. Blood pressures remained within normal ranges at the 40-minute and 2-hour follow-up intervals. By the time the 2-hour observation period was met the patient was alert and oriented and able to exit without assistance.  Gwendolyn Carrillo reports a positive experience with Spravato today.  Patient feels like the Spravato is helpful for her treatment resistant depression and would like to continue treatment. See nursing note for further details.    No meds were changed today - her BP was controlled prior to and during the Spravato administration. See the nursing note for those details. She is compliant with her outpatient blood pressure medicines and reports a recent check in with PCP related to BP.  Will continue 84 mg Spravato for upcoming treatments.     She also has a history of generalized anxiety disorder, OCD, and panic.   Gwendolyn Carrillo reports a positive experience with Spravato administration today. Describes mood today as improved with Spravato. Stating it helps me. She is tolerating the 84mg  dose. She denied any  untoward side effects today. Stating I feel good. Reports some recent situational stressors effecting mood. Mood symptoms - she reports decreased depression after treatment today. She denied any anxiety and irritability today. Reports appetite adequate - weight stable. Reports sleeping better. Reports improved focus and concentration. Denies SI or HI.   Will plan on continuation of Spravato at 84mg  at next treatment.    ECT-MADRS    Flowsheet Row Video Visit from 01/05/2024 in [name]  MADRS Total Score 31   GAD-7    Flowsheet Row Office Visit from 08/31/2022 in [facility] Office Visit from 03/03/2022 in [facility] Office Visit from 11/25/2021 in [facility] Office Visit from 08/12/2021 in [facility] Office Visit from 04/01/2021 in [facility]  Total GAD-7 Score 12 14 5 8 16    PHQ2-9    Flowsheet Row Video Visit from 01/05/2024 in [name] Office Visit from 08/31/2022 in [facility] Office Visit from 03/03/2022 in [facility] Office Visit from 02/10/2022 in [facility] Office Visit from 11/25/2021 in [facility]  PHQ-2 Total Score 4 4 6 5 2   PHQ-9 Total Score -- 17 14 13 8    Flowsheet Row ED from 05/26/2023 in [id number] Emergency Department at [facility] ED from 12/11/2022 in [facility] Emergency Department at [facility] ED from 05/05/2022 in [facility] Emergency Department at [facility]  C-SSRS RISK CATEGORY No Risk No Risk No Risk     Review of Systems:  Review of Systems  Musculoskeletal:  Negative  for gait problem.  Neurological:  Negative for tremors.  Psychiatric/Behavioral:         Please refer to HPI    Medications: I have reviewed the  patient's current medications.  Current Outpatient Medications  Medication Sig Dispense Refill   ALPRAZolam  (XANAX ) 1 MG tablet TAKE 1 TABLET BY MOUTH 2 TIMES DAILY AS NEEDED FOR ANXIETY. 60 tablet 0   atorvastatin  (LIPITOR) 80 MG tablet Take 1 tablet (80 mg total) by mouth daily. 90 tablet 1   Blood Glucose Monitoring Suppl (ONETOUCH VERIO) w/Device KIT USE TO CHECK BLOOD SUGAR THREE TIMES DAILY. 1 kit 0   Cholecalciferol (VITAMIN D3) 50 MCG (2000 UT) capsule Take 1 capsule (2,000 Units total) by mouth daily. 90 capsule 1   clindamycin  (CLEOCIN ) 2 % vaginal cream Place 1 Applicatorful vaginally at bedtime. 40 g 0   Continuous Blood Gluc Receiver (DEXCOM G6 RECEIVER) DEVI 1 Bag by Does not apply route in the morning, at noon, and at bedtime. 1 each 0   Continuous Glucose Sensor (DEXCOM G6 SENSOR) MISC USE AS DIRECTED EVERY 10 DAYS 9 each 2   Continuous Glucose Transmitter (DEXCOM G6 TRANSMITTER) MISC USE AS DIRECTED OVER 90 DAYS 1 each 2   Continuous Glucose Transmitter (DEXCOM G6 TRANSMITTER) MISC USE AS DIRECTED OVER 90 DAYS     cyclobenzaprine  (FLEXERIL ) 10 MG tablet Take 1 tablet (10 mg total) by mouth 2 (two) times daily as needed for muscle spasms. 12 tablet 0   Elastic Bandages & Supports (ELBOW COMPRESSION) MISC 1 Application by Does not apply route daily. 1 each 0   glucose blood (ONETOUCH VERIO) test strip USE TO CHECK BLOOD SUGAR THREE TIMES DAILY. 100 each 2   hydrochlorothiazide  (HYDRODIURIL ) 25 MG tablet Take 1 tablet (25 mg total) by mouth daily. 90 tablet 1   hydrOXYzine  (ATARAX ) 25 MG tablet Take 1 tablet (25 mg total) by mouth 3 (three) times daily as needed. 90 tablet 1   Insulin  Glargine (BASAGLAR  KWIKPEN) 100 UNIT/ML Inject 15 Units into the skin daily. 15 mL 0   insulin  lispro (HUMALOG  KWIKPEN) 100 UNIT/ML KwikPen INJECT up to 12u INTO THE SKIN 3 TIMES DAILY WITH MEALS per sliding scale: 200-250: 4 UNITS, 251-300: 6 UNITS, 301-350: 8 UNITS, 351-400: 10 UNITS,>400:12 UNITS AND  CALL NP. Max daily dose: 36u 30 mL 0   Insulin  Pen Needle (PEN NEEDLES) 32G X 5 MM MISC Use to inject sliding scale Humalog  three times daily. 100 each 6   JARDIANCE  10 MG TABS tablet Take 10 mg by mouth daily.     lidocaine  (LIDODERM ) 5 % Place 1 patch onto the skin daily. Remove & Discard patch within 12 hours or as directed by MD 30 patch 0   LINZESS  145 MCG CAPS capsule Take 1 capsule (145 mcg total) by mouth daily as needed (constipation). 90 capsule 1   lisinopril  (ZESTRIL ) 20 MG tablet Take 1 tablet (20 mg total) by mouth daily. 90 tablet 1   metFORMIN (GLUCOPHAGE) 500 MG tablet Take 500 mg by mouth daily with breakfast.     metoCLOPramide  (REGLAN ) 10 MG tablet Take 10 mg by mouth every 6 (six) hours as needed.     metoprolol  tartrate (LOPRESSOR ) 25 MG tablet TAKE 1 TABLET BY MOUTH TWICE A DAY 60 tablet 0   norethindrone  (MICRONOR ) 0.35 MG tablet Take 1 tablet (0.35 mg total) by mouth daily. 28 tablet 0   omeprazole  (PRILOSEC) 20 MG capsule TAKE 1 CAPSULE BY MOUTH DAILY IF NEEDED FOR  GASTRIC REFLUX 90 capsule 0   ondansetron  (ZOFRAN -ODT) 4 MG disintegrating tablet TAKE 1 TABLET BY MOUTH EVERY 8 HOURS AS NEEDED FOR NAUSEA AND VOMITING 12 tablet 0   OneTouch Delica Lancets 33G MISC USE TO CHECK BLOOD SUGAR THREE TIMES DAILY. 100 each 2   telmisartan (MICARDIS) 80 MG tablet Take 1 tablet by mouth daily.     No current facility-administered medications for this visit.    Medication Side Effects: None  Allergies: Not on File  Past Medical History:  Diagnosis Date   Anxiety    Diabetes mellitus without complication (HCC)    Gastroparesis    Hypertension    Major depressive disorder     Past Medical History, Surgical history, Social history, and Family history were reviewed and updated as appropriate.   Please see review of systems for further details on the patient's review from today.   Objective:   Physical Exam:  There were no vitals taken for this visit.  Physical  Exam Constitutional:      General: She is not in acute distress. Musculoskeletal:        General: No deformity.  Neurological:     Mental Status: She is alert and oriented to person, place, and time.     Coordination: Coordination normal.  Psychiatric:        Attention and Perception: Attention and perception normal. She does not perceive auditory or visual hallucinations.        Mood and Affect: Mood normal. Mood is not anxious or depressed. Affect is not labile, blunt, angry or inappropriate.        Speech: Speech normal.        Behavior: Behavior normal.        Thought Content: Thought content normal. Thought content is not paranoid or delusional. Thought content does not include homicidal or suicidal ideation. Thought content does not include homicidal or suicidal plan.        Cognition and Memory: Cognition and memory normal.        Judgment: Judgment normal.     Comments: Insight intact     Lab Review:     Component Value Date/Time   NA 131 (L) 05/25/2023 2132   NA 141 08/31/2022 0954   K 3.5 05/25/2023 2132   CL 94 (L) 05/25/2023 2132   CO2 25 05/25/2023 2132   GLUCOSE 314 (H) 05/25/2023 2132   BUN 10 05/25/2023 2132   BUN 12 08/31/2022 0954   CREATININE 0.87 05/25/2023 2132   CALCIUM  9.5 05/25/2023 2132   PROT 8.4 (H) 05/25/2023 2132   PROT 7.6 08/31/2022 0954   ALBUMIN 4.4 05/25/2023 2132   ALBUMIN 4.3 08/31/2022 0954   AST 71 (H) 05/25/2023 2132   ALT 86 (H) 05/25/2023 2132   ALKPHOS 70 05/25/2023 2132   BILITOT 1.0 05/25/2023 2132   BILITOT 0.3 08/31/2022 0954   GFRNONAA >60 05/25/2023 2132   GFRAA >60 10/10/2019 1441       Component Value Date/Time   WBC 11.8 (H) 05/25/2023 2132   RBC 4.51 05/25/2023 2132   HGB 15.1 (H) 05/25/2023 2132   HGB 13.9 08/31/2022 0954   HCT 42.4 05/25/2023 2132   HCT 41.0 08/31/2022 0954   PLT 187 05/25/2023 2132   PLT 249 08/31/2022 0954   MCV 94.0 05/25/2023 2132   MCV 96 08/31/2022 0954   MCH 33.5 05/25/2023 2132    MCHC 35.6 05/25/2023 2132   RDW 12.5 05/25/2023 2132   RDW 12.6 08/31/2022 0954  LYMPHSABS 0.5 (L) 05/25/2023 2132   LYMPHSABS 2.3 08/31/2022 0954   MONOABS 0.5 05/25/2023 2132   EOSABS 0.2 05/25/2023 2132   EOSABS 0.3 08/31/2022 0954   BASOSABS 0.0 05/25/2023 2132   BASOSABS 0.1 08/31/2022 0954    No results found for: POCLITH, LITHIUM   No results found for: PHENYTOIN, PHENOBARB, VALPROATE, CBMZ   .res Assessment: Plan:    Plan:  PDMP reviewed  Xanax  1mg  BID as needed for anxiety and panic attacks Hydroxyzine  25mg  TID as needed Prozac  10mg  daily   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  Gene sight testing available.  Working with a paramedic.  RTC 5 days  There are no diagnoses linked to this encounter.   Please see After Visit Summary for patient specific instructions.  Future Appointments  Date Time Provider Department Center  03/21/2024 11:30 AM Hilario Robarts Nattalie, NP [facility] None  04/05/2024 11:00 AM Pam Vanalstine [place], NP [facility] None  05/03/2024 12:00 PM Prentiss Riggs A, NP [facility] None    No orders of the defined types were placed in this encounter.   -------------------------------

## 2024-03-20 ENCOUNTER — Ambulatory Visit: Admitting: Psychiatry

## 2024-03-20 ENCOUNTER — Ambulatory Visit

## 2024-03-20 DIAGNOSIS — F422 Mixed obsessional thoughts and acts: Secondary | ICD-10-CM

## 2024-03-20 DIAGNOSIS — G4700 Insomnia, unspecified: Secondary | ICD-10-CM

## 2024-03-20 DIAGNOSIS — F411 Generalized anxiety disorder: Secondary | ICD-10-CM

## 2024-03-20 DIAGNOSIS — F410 Panic disorder [episodic paroxysmal anxiety] without agoraphobia: Secondary | ICD-10-CM

## 2024-03-20 DIAGNOSIS — I10 Essential (primary) hypertension: Secondary | ICD-10-CM

## 2024-03-20 DIAGNOSIS — F339 Major depressive disorder, recurrent, unspecified: Secondary | ICD-10-CM

## 2024-03-21 ENCOUNTER — Telehealth: Admitting: Adult Health

## 2024-03-21 ENCOUNTER — Encounter: Payer: Self-pay | Admitting: Adult Health

## 2024-03-21 DIAGNOSIS — F411 Generalized anxiety disorder: Secondary | ICD-10-CM | POA: Diagnosis not present

## 2024-03-21 DIAGNOSIS — G4700 Insomnia, unspecified: Secondary | ICD-10-CM

## 2024-03-21 DIAGNOSIS — F422 Mixed obsessional thoughts and acts: Secondary | ICD-10-CM

## 2024-03-21 DIAGNOSIS — F339 Major depressive disorder, recurrent, unspecified: Secondary | ICD-10-CM

## 2024-03-21 DIAGNOSIS — F410 Panic disorder [episodic paroxysmal anxiety] without agoraphobia: Secondary | ICD-10-CM

## 2024-03-21 NOTE — Progress Notes (Signed)
 Gwendolyn Carrillo [id number] 1975-10-13 48 y.o.  Virtual Visit via Video Note  I connected with pt @ on 03/21/24 at 11:30 AM EST by a video enabled telemedicine application and verified that I am speaking with the correct person using two identifiers.   I discussed the limitations of evaluation and management by telemedicine and the availability of in person appointments. The patient expressed understanding and agreed to proceed.  I discussed the assessment and treatment plan with the patient. The patient was provided an opportunity to ask questions and all were answered. The patient agreed with the plan and demonstrated an understanding of the instructions.   The patient was advised to call back or seek an in-person evaluation if the symptoms worsen or if the condition fails to improve as anticipated.  I provided 25 minutes of non-face-to-face time during this encounter.  The patient was located at home.  The provider was located at [facility].   Angeline LOISE Sayers, NP   Subjective:   Patient ID:  Gwendolyn Carrillo is a 48 y.o. (DOB Feb 23, 1976) female.  Chief Complaint: No chief complaint on file.   HPI Gwendolyn Carrillo presents for follow-up of MDD, mixed obsessional thoughts, insomnia, panic attacks and GAD.   Describes mood today as lower. Flat. Reports tearfulness.  Mood symptoms - reports feeling sad. Feels like the depression is improved with the Spravato  treatments. Report increased anxiety with situational stressors. Denies irritability. Reports varying interest and motivation. Denies recent panic attacks. Reports some worry, rumination and over thinking. Reports family and financial stressors. Reports possible hormonal fluctuations also effecting mood - plans to see OB in January. Reports mood has improved with the Spravato  treatments. Stating I feel like the Spravato  treatments are helpful. She would like to continue treatments for now. She does not feel that she  is ready to return to work. Reports seeing a therapist - Ver[facility] - [place]. Taking medications as prescribed.  Reports energy levels have improved some with getting better sleep. Active, does not have a regular exercise routine. Reports she is able to find some joy in some usual interests and activities - but has to force herself to get going. Single. Lives alone. Has 4 children. Family local. Spending time with family.  Appetite decreased - not eating much. Weight stable - 190 pounds.  Reports sleep is variable - some nights are better than others. Reports 6 hours of broken sleep - at the most. Reports focus and concentration difficulties - starting and stopping tasks - avoiding tasks. Reports improved self care - I am trying to make an  effort . Managing some aspects of household. Typically works full time - remote position with [employer] - out of work currently.  Denies AH or VH. Denies self harm. Denies substance use. Denies alcohol use.  Previous medications: Zoloft , Clonazepam    Review of Systems:  Review of Systems  Musculoskeletal:  Negative for gait problem.  Neurological:  Negative for tremors.  Psychiatric/Behavioral:         Please refer to HPI    Medications: I have reviewed the patient's current medications.  Current Outpatient Medications  Medication Sig Dispense Refill   ALPRAZolam  (XANAX ) 1 MG tablet TAKE 1 TABLET BY MOUTH 2 TIMES DAILY AS NEEDED FOR ANXIETY. 60 tablet 0   atorvastatin  (LIPITOR) 80 MG tablet Take 1 tablet (80 mg total) by mouth daily. 90 tablet 1   Blood Glucose Monitoring Suppl (ONETOUCH VERIO) w/Device KIT USE TO CHECK BLOOD SUGAR THREE  TIMES DAILY. 1 kit 0   Cholecalciferol (VITAMIN D3) 50 MCG (2000 UT) capsule Take 1 capsule (2,000 Units total) by mouth daily. 90 capsule 1   clindamycin  (CLEOCIN ) 2 % vaginal cream Place 1 Applicatorful vaginally at bedtime. 40 g 0   Continuous Blood Gluc Receiver (DEXCOM G6 RECEIVER) DEVI 1 Bag by Does not  apply route in the morning, at noon, and at bedtime. 1 each 0   Continuous Glucose Sensor (DEXCOM G6 SENSOR) MISC USE AS DIRECTED EVERY 10 DAYS 9 each 2   Continuous Glucose Transmitter (DEXCOM G6 TRANSMITTER) MISC USE AS DIRECTED OVER 90 DAYS 1 each 2   Continuous Glucose Transmitter (DEXCOM G6 TRANSMITTER) MISC USE AS DIRECTED OVER 90 DAYS     cyclobenzaprine  (FLEXERIL ) 10 MG tablet Take 1 tablet (10 mg total) by mouth 2 (two) times daily as needed for muscle spasms. 12 tablet 0   Elastic Bandages & Supports (ELBOW COMPRESSION) MISC 1 Application by Does not apply route daily. 1 each 0   glucose blood (ONETOUCH VERIO) test strip USE TO CHECK BLOOD SUGAR THREE TIMES DAILY. 100 each 2   hydrochlorothiazide  (HYDRODIURIL ) 25 MG tablet Take 1 tablet (25 mg total) by mouth daily. 90 tablet 1   hydrOXYzine  (ATARAX ) 25 MG tablet Take 1 tablet (25 mg total) by mouth 3 (three) times daily as needed. 90 tablet 1   Insulin  Glargine (BASAGLAR  KWIKPEN) 100 UNIT/ML Inject 15 Units into the skin daily. 15 mL 0   insulin  lispro (HUMALOG  KWIKPEN) 100 UNIT/ML KwikPen INJECT up to 12u INTO THE SKIN 3 TIMES DAILY WITH MEALS per sliding scale: 200-250: 4 UNITS, 251-300: 6 UNITS, 301-350: 8 UNITS, 351-400: 10 UNITS,>400:12 UNITS AND CALL NP. Max daily dose: 36u 30 mL 0   Insulin  Pen Needle (PEN NEEDLES) 32G X 5 MM MISC Use to inject sliding scale Humalog  three times daily. 100 each 6   JARDIANCE  10 MG TABS tablet Take 10 mg by mouth daily.     lidocaine  (LIDODERM ) 5 % Place 1 patch onto the skin daily. Remove & Discard patch within 12 hours or as directed by MD 30 patch 0   LINZESS  145 MCG CAPS capsule Take 1 capsule (145 mcg total) by mouth daily as needed (constipation). 90 capsule 1   lisinopril  (ZESTRIL ) 20 MG tablet Take 1 tablet (20 mg total) by mouth daily. 90 tablet 1   metFORMIN (GLUCOPHAGE) 500 MG tablet Take 500 mg by mouth daily with breakfast.     metoCLOPramide  (REGLAN ) 10 MG tablet Take 10 mg by mouth  every 6 (six) hours as needed.     metoprolol  tartrate (LOPRESSOR ) 25 MG tablet TAKE 1 TABLET BY MOUTH TWICE A DAY 60 tablet 0   norethindrone  (MICRONOR ) 0.35 MG tablet Take 1 tablet (0.35 mg total) by mouth daily. 28 tablet 0   omeprazole  (PRILOSEC) 20 MG capsule TAKE 1 CAPSULE BY MOUTH DAILY IF NEEDED FOR GASTRIC REFLUX 90 capsule 0   ondansetron  (ZOFRAN -ODT) 4 MG disintegrating tablet TAKE 1 TABLET BY MOUTH EVERY 8 HOURS AS NEEDED FOR NAUSEA AND VOMITING 12 tablet 0   OneTouch Delica Lancets 33G MISC USE TO CHECK BLOOD SUGAR THREE TIMES DAILY. 100 each 2   telmisartan (MICARDIS) 80 MG tablet Take 1 tablet by mouth daily.     No current facility-administered medications for this visit.    Medication Side Effects: None  Allergies: Not on File  Past Medical History:  Diagnosis Date   Anxiety    Diabetes mellitus without  complication (HCC)    Gastroparesis    Hypertension    Major depressive disorder     Family History  Problem Relation Age of Onset   Hyperlipidemia Mother    Hypertension Mother    Hyperlipidemia Father    Hypertension Father    Diabetes Father    Diabetes Sister    Blindness Sister    Hypertension Brother    Diabetes Brother    Stroke Brother    Diabetes Maternal Aunt    Diabetes Maternal Grandmother    Hypertension Maternal Grandmother    Heart disease Maternal Grandmother    Diabetes Paternal Grandmother    Hypertension Paternal Grandmother    Heart disease Paternal Grandmother     Social History   Socioeconomic History   Marital status: Single    Spouse name: Not on file   Number of children: Not on file   Years of education: Not on file   Highest education level: Not on file  Occupational History   Not on file  Tobacco Use   Smoking status: Some Days    Current packs/day: 0.25    Types: Cigarettes   Smokeless tobacco: Never   Tobacco comments:    I smoke when I'm drinking  Vaping Use   Vaping status: Never Used  Substance and  Sexual Activity   Alcohol use: Yes    Comment: occ   Drug use: Not Currently    Types: Marijuana   Sexual activity: Not Currently    Partners: Male    Comment: First IC <16, Partners >5, Hx of CT+, GC+  Other Topics Concern   Not on file  Social History Narrative   Not on file   Social Drivers of Health   Financial Resource Strain: Not on file  Food Insecurity: Low Risk  (03/09/2024)   Received from [facility]   Hunger Vital Sign    Within the past 12 months, you worried that your food would run out before you got money to buy more: Never true    Within the past 12 months, the food you bought just didn't last and you didn't have money to get more. : Never true  Transportation Needs: No Transportation Needs (03/09/2024)   Received from [facility]    In the past 12 months, has lack of reliable transportation kept you from medical appointments, meetings, work or from getting things needed for daily living? : No  Physical Activity: Not on file  Stress: Not on file  Social Connections: Unknown (08/25/2021)   [facility]   Social Network    Social Network: Not on file  Intimate Partner Violence: Unknown (07/30/2021)   Received from [facility]   HITS    Physically Hurt: Not on file    Insult or Talk Down To: Not on file    Threaten Physical Harm: Not on file    Scream or Curse: Not on file    Past Medical History, Surgical history, Social history, and Family history were reviewed and updated as appropriate.   Please see review of systems for further details on the patient's review from today.   Objective:   Physical Exam:  There were no vitals taken for this visit.  Physical Exam Constitutional:      General: She is not in acute distress. Musculoskeletal:        General: No deformity.  Neurological:     Mental Status: She is alert and oriented to person, place,  and time.     Cranial Nerves: No dysarthria.     Coordination:  Coordination normal.  Psychiatric:        Attention and Perception: Attention and perception normal. She does not perceive auditory or visual hallucinations.        Mood and Affect: Mood is anxious and depressed. Affect is flat and tearful. Affect is not labile, blunt, angry or inappropriate.        Speech: Speech normal.        Behavior: Behavior is slowed and withdrawn. Behavior is cooperative.        Thought Content: Thought content normal. Thought content is not paranoid or delusional. Thought content does not include homicidal or suicidal ideation. Thought content does not include homicidal or suicidal plan.        Cognition and Memory: Cognition and memory normal.        Judgment: Judgment normal.     Comments: Insight intact     Lab Review:     Component Value Date/Time   NA 131 (L) 05/25/2023 2132   NA 141 08/31/2022 0954   K 3.5 05/25/2023 2132   CL 94 (L) 05/25/2023 2132   CO2 25 05/25/2023 2132   GLUCOSE 314 (H) 05/25/2023 2132   BUN 10 05/25/2023 2132   BUN 12 08/31/2022 0954   CREATININE 0.87 05/25/2023 2132   CALCIUM  9.5 05/25/2023 2132   PROT 8.4 (H) 05/25/2023 2132   PROT 7.6 08/31/2022 0954   ALBUMIN 4.4 05/25/2023 2132   ALBUMIN 4.3 08/31/2022 0954   AST 71 (H) 05/25/2023 2132   ALT 86 (H) 05/25/2023 2132   ALKPHOS 70 05/25/2023 2132   BILITOT 1.0 05/25/2023 2132   BILITOT 0.3 08/31/2022 0954   GFRNONAA >60 05/25/2023 2132   GFRAA >60 10/10/2019 1441       Component Value Date/Time   WBC 11.8 (H) 05/25/2023 2132   RBC 4.51 05/25/2023 2132   HGB 15.1 (H) 05/25/2023 2132   HGB 13.9 08/31/2022 0954   HCT 42.4 05/25/2023 2132   HCT 41.0 08/31/2022 0954   PLT 187 05/25/2023 2132   PLT 249 08/31/2022 0954   MCV 94.0 05/25/2023 2132   MCV 96 08/31/2022 0954   MCH 33.5 05/25/2023 2132   MCHC 35.6 05/25/2023 2132   RDW 12.5 05/25/2023 2132   RDW 12.6 08/31/2022 0954   LYMPHSABS 0.5 (L) 05/25/2023 2132   LYMPHSABS 2.3 08/31/2022 0954   MONOABS 0.5  05/25/2023 2132   EOSABS 0.2 05/25/2023 2132   EOSABS 0.3 08/31/2022 0954   BASOSABS 0.0 05/25/2023 2132   BASOSABS 0.1 08/31/2022 0954    No results found for: POCLITH, LITHIUM   No results found for: PHENYTOIN, PHENOBARB, VALPROATE, CBMZ   .res Assessment: Plan:   Plan:  PDMP reviewed  Xanax  1mg  BID as needed for anxiety and panic attacks Hydroxyzine  25mg  TID as needed Prozac  10mg  daily   Spravato  treatment due to treatment resistant depression.  Gene sight testing available.  Working with a paramedic.  RTC 4 weeks  25 minutes spent dedicated to the care of this patient on the date of this encounter to include pre-visit review of records, ordering of medication, post visit documentation, and face-to-face time with the patient discussing TRD, mixed obsessional thoughts, insomnia, panic attacks, and GAD. Discussed continuing current medication regimen. Patient was advised to contact office with any questions, adverse effects, or acute worsening in signs and symptoms. Patient is to remain out of work - 03/23/2024 thru 04/27/2024 for  Spravato  treatments. A return to work date will be determined at appointment on 04/27/2024.   Discussed potential benefits, risk, and side effects of benzodiazepines to include potential risk of tolerance and dependence, as well as possible drowsiness. Advised patient not to drive if experiencing drowsiness and to take lowest possible effective dose to minimize risk of dependence and tolerance. There are no diagnoses linked to this encounter.   Please see After Visit Summary for patient specific instructions.  Future Appointments  Date Time Provider Department Center  03/21/2024 11:30 AM Rolando Whitby Nattalie, NP [facility] None  04/05/2024 11:00 AM Bertel Venard Nattalie, NP [facility] None  05/03/2024 12:00 PM Prentiss Riggs A, NP [facility] None    No orders of the defined types were placed in this encounter.      -------------------------------

## 2024-03-21 NOTE — Progress Notes (Signed)
 NURSES NOTE:         Pt arrived for her 15th Spravato  Treatment for treatment resistant depression, pt will be getting 84 mg (3 of the 28 mg inhalers), for continued maintenance dose. Pt sees [employer] and she will follow up with her for office visits and see her at treatment when she is in the office. Explained to pt how the treatments would be scheduled and answered any questions she had today. Pt's Spravato  is billed through buy and bill medically.  Spravato  medication is stored at treatment center per REMS/FDA guidelines. The medication is required to be locked behind two doors per REMS/FDA protocol. Medication is also disposed of properly after each use per regulations. All documentation for REMS is completed and submitted per FDA/REMS requirements.          Began taking patient's vital signs at 9:34 AM 141/85, pulse 82, SpO2 Sat. 97%. Instructed patient to blow her nose if needed then recline back to a 45 degree angle. Gave patient first dose 28 mg nasal spray, administered in each nostril as directed and observed by nurse, waited 5 more minutes for the second and third doses. After all doses given pt did not complain of any nausea/vomiting, pt did have a drink to help with the taste of Spravato  it gives the metallic taste. Assessed her 40 minute vitals, 10:20 AM, 152/95, pulse 81, SpO2 96%.  Explained she would be monitored for a total time of 120 minutes after beginning treatment. Discharge vitals were taken at 10:57 AM 142/91, P 81, SpO2 97%. Dr. Geoffry came to visit with patient once her thoughts were clearer to discuss how treatment went. Recommend she go home and sleep or just relax on the couch. No driving, no intense activities. Verbalized understanding. Pt will return next week. Nurse was with pt a total of 60 minutes for clinical assessment.  Pt will get 84 mg.   (84 mg)  LOT [id number] EXP [id number]

## 2024-03-27 ENCOUNTER — Encounter: Payer: Self-pay | Admitting: Psychiatry

## 2024-03-27 ENCOUNTER — Ambulatory Visit

## 2024-03-27 ENCOUNTER — Ambulatory Visit: Admitting: Psychiatry

## 2024-03-27 VITALS — BP 122/76 | HR 74

## 2024-03-27 DIAGNOSIS — F410 Panic disorder [episodic paroxysmal anxiety] without agoraphobia: Secondary | ICD-10-CM

## 2024-03-27 DIAGNOSIS — F422 Mixed obsessional thoughts and acts: Secondary | ICD-10-CM

## 2024-03-27 DIAGNOSIS — F339 Major depressive disorder, recurrent, unspecified: Secondary | ICD-10-CM

## 2024-03-27 DIAGNOSIS — I10 Essential (primary) hypertension: Secondary | ICD-10-CM

## 2024-03-27 DIAGNOSIS — G4700 Insomnia, unspecified: Secondary | ICD-10-CM

## 2024-03-27 DIAGNOSIS — F411 Generalized anxiety disorder: Secondary | ICD-10-CM

## 2024-03-27 NOTE — Progress Notes (Signed)
 AGAPE HARDIMAN [id number] 02/05/1976 48 y.o.  Subjective:   Patient ID:  Gwendolyn Carrillo is a 48 y.o. (DOB 05/28/1975) female.  Chief Complaint:  Chief Complaint  Patient presents with   Follow-up   Depression   Anxiety   Stress    HPI Gwendolyn Carrillo presents to the office today for follow-up of treatment resistant major depression.  She also has a history of generalized anxiety disorder, OCD, and panic.  She describes a long history of depression and anxiety and has been unable to tolerate most antidepressants.  She is very eager to pursue Spravato  for relief from her depression.    01/31/24 appt noted:  Her PHQ-9 score was 22 today.  She describes symptoms of anhedonia, low motivation, difficulty concentrating, poor productivity, difficulty being around people and wanting to isolate.  She is not suicidal but she has had hopelessness other than this opportunity to pursue Spravato .  She does not feel like she can function at work and is discussing leave of absence with her usual provider [employer], PNP. She was so excited to start treatment today that she had trouble sleeping last night. She received her first administration of Spravato  at 56 mg today.  She had no untoward side effects such as nausea dizziness headache or vomiting.  She did have the expected dissociation but it was not scary.  In fact she experienced a relief from negative emotions such as depression and anxiety while under treatment. She was very drowsy and fell asleep.  She explained it as being due to being sleep deprived.  She is eager to continue the Spravato .  02/02/24 appt noted:  She received her second administration of Spravato  at 56 mg today.  She had no untoward side effects such as nausea dizziness headache or vomiting.  She did have the expected dissociation but it was not scary.  In fact she experienced a relief from negative emotions such as depression and anxiety while under treatment. She was  much less drowsy with it.  Would like to increase dose as planned.  No SI.  Tolerating current tx plan.   02/14/24 appt noted: 2 Spravato  84 mg tx last week as planned Here for Spravato  84 mg today.  Expected dissociation well tolerated without NV, HA.  Some dizziness.  Good emotional relief from negativity immediately from Spravato .  Med: no fluoxetine  bc not tolerated Mood sig better by most in decades with Spravato .  Dep is much better.  Anxiety is better too.  Sleep is ok.  Wants to continue tx.  02/16/24 appt noted:  No psych meds bc not tolerated. Here for Spravato  84 mg today.  Expected dissociation well tolerated without NV, HA.  Some dizziness.  Good emotional relief from negativity immediately from Spravato .  Mood so far dramatically better and anxiety better with Spravato .  No new problems.  Wants to continue Spravato .  02/21/24 appt noted:  No psych meds bc not tolerated. Here for Spravato  84 mg today.  Expected dissociation well tolerated without NV, HA.  Some dizziness.  Good emotional relief from negativity immediately from Spravato .  Dep has gone into remission Spravato  and anxiety is much better .  She is very happy with the tx plan.  No new concerns.    02/28/24 appt noted:  No psych meds bc not tolerated. Here for Spravato  84 mg today.  Expected dissociation well tolerated without NV, HA.  Some dizziness.  Good emotional relief from negativity immediately from Spravato .  Dep unexpectedly  worse Saturday and since.  Having experienced relief she doesn't want to go backwards. There are some stressors contributing to it but she wants to continue Spravato  twice weekly until she gets better.  03/01/24 appt noted: No psych meds bc not tolerated. Here for Spravato  84 mg today.  Expected dissociation well tolerated without NV, HA.  Some dizziness.  Good emotional relief from negativity immediately from Spravato .  Felt need bc relapse dep to increase Spravato  back to twice weekly.    Feels better about getting second spravato  this week.  She believes it will help get her out of the dep like she was before.  Wants to avoid psych med bc hx intolerance.  BP was high this am and required clonidine 0.1 mg which resolved BP px.  She will discuss with PDP later this month.  03/06/24 appt noted: No psych meds bc not tolerated. Here for Spravato  84 mg today.  Expected dissociation well tolerated without NV, HA.  Some dizziness.  Good emotional relief from negativity immediately from Spravato .  Had hypertension this am prior ot Spravato  and was treated with clonidine 0.1 mg which worked.  Results in nurses note. She will talk with PCP on Friday about this bc it has happened several times. Felt need bc relapse dep to increase Spravato  back to twice weekly.  But dep is improving again.  Feels satisfied with tx and wants to continue current plan.  03/08/24 appt noted: No psych meds bc not tolerated. Here for Spravato  84 mg today.  Expected dissociation well tolerated without NV, HA.  Some dizziness.  Good emotional relief from negativity immediately from Spravato .  BP ok today;  Results in nurses note. She will talk with PCP on Friday about this bc it has happened several times. Dep and anxiety are much better with Spravato  and greatly relieved at the benefit for the first time in many years.  03/27/24 appt noted:  No psych meds bc not tolerated. Here for Spravato  84 mg today.  Expected dissociation well tolerated without NV, HA.  Some dizziness.  Good emotional relief from negativity immediately from Spravato .  Results in nurses note. BP mildly elevated at imes today Received clonidine 0.1 mg today and BP normalized. Mood overall  much better with Spravato .However she does have dips if she waits longer than a few days between Spravato  administration.  She feels the twice weekly is more effective at keeping her depression under control.  She has had a little increase in depression since  only receiving 1 Spravato  administration last week.  There are other factors such as work stress that are contributing to a sense of depression and worry.  She has discussed this with her other provider.   Previous medications: Zoloft , fluoxetine , others,  Clonazepam    ECT-MADRS    Flowsheet Row Video Visit from 01/05/2024 in [name]  MADRS Total Score 31   GAD-7    Flowsheet Row Office Visit from 08/31/2022 in [facility] Office Visit from 03/03/2022 in [facility] Office Visit from 11/25/2021 in [facility] Office Visit from 08/12/2021 in [facility] Office Visit from 04/01/2021 in [facility]  Total GAD-7 Score 12 14 5 8 16    PHQ2-9    Flowsheet Row Video Visit from 01/05/2024 in [name] Office Visit from 08/31/2022 in [facility] Office Visit from 03/03/2022 in [facility] Office Visit from 02/10/2022 in [facility] Office Visit from 11/25/2021 in [facility]  PHQ-2 Total Score 4 4 6 5 2   PHQ-9 Total Score -- 17 14 13 8    Flowsheet Row ED from 05/26/2023 in [facility] at [facility] ED from 12/11/2022 in [facility] at [facility] ED from 05/05/2022 in [facility] Department at [facility]  C-SSRS RISK CATEGORY No Risk No Risk No Risk     Review of Systems:  Review of Systems  Cardiovascular:  Negative for chest pain and palpitations.  Gastrointestinal:  Negative for nausea and vomiting.  Neurological:  Negative for dizziness, weakness and headaches.  Psychiatric/Behavioral:  Positive for dysphoric mood. Negative for decreased concentration and sleep disturbance. The patient is nervous/anxious.      Medications: I have reviewed the patient's current medications.  Current Outpatient Medications  Medication Sig Dispense Refill   ALPRAZolam  (XANAX ) 1 MG tablet TAKE 1 TABLET BY MOUTH 2 TIMES DAILY AS NEEDED FOR ANXIETY. 60 tablet 0   atorvastatin  (LIPITOR) 80 MG tablet Take 1 tablet (80 mg total) by mouth daily. 90 tablet 1   Blood Glucose Monitoring Suppl (ONETOUCH VERIO) w/Device KIT USE TO CHECK BLOOD SUGAR THREE TIMES DAILY. 1 kit 0   Cholecalciferol (VITAMIN D3) 50 MCG (2000 UT) capsule Take 1 capsule (2,000 Units total) by mouth daily. 90 capsule 1   clindamycin  (CLEOCIN ) 2 % vaginal cream Place 1 Applicatorful vaginally at bedtime. 40 g 0   Continuous Blood Gluc Receiver (DEXCOM G6 RECEIVER) DEVI 1 Bag by Does not apply route in the morning, at noon, and at bedtime. 1 each 0   Continuous Glucose Sensor (DEXCOM G6 SENSOR) MISC USE AS DIRECTED EVERY 10 DAYS 9 each 2   Continuous Glucose Transmitter (DEXCOM G6 TRANSMITTER) MISC USE AS DIRECTED OVER 90 DAYS 1 each 2   Continuous Glucose Transmitter (DEXCOM G6 TRANSMITTER) MISC USE AS DIRECTED OVER 90 DAYS     cyclobenzaprine  (FLEXERIL ) 10 MG tablet Take 1 tablet (10 mg total) by mouth 2 (two) times daily as needed for muscle spasms. 12 tablet 0   Elastic Bandages & Supports (ELBOW COMPRESSION) MISC 1 Application by Does not apply route daily. 1 each 0   glucose blood (ONETOUCH VERIO) test strip USE TO CHECK BLOOD SUGAR THREE TIMES DAILY. 100 each 2   hydrochlorothiazide  (HYDRODIURIL ) 25 MG tablet Take 1 tablet (25 mg total) by mouth daily. 90 tablet 1   hydrOXYzine  (ATARAX ) 25 MG tablet Take 1 tablet (25 mg total) by mouth 3 (three) times daily as needed. 90 tablet 1   Insulin  Glargine (BASAGLAR  KWIKPEN) 100 UNIT/ML Inject 15 Units into the skin daily. 15 mL 0   insulin  lispro (HUMALOG  KWIKPEN) 100 UNIT/ML KwikPen INJECT up to 12u INTO THE SKIN 3 TIMES DAILY WITH MEALS per sliding scale: 200-250: 4 UNITS, 251-300: 6 UNITS, 301-350: 8  UNITS, 351-400: 10 UNITS,>400:12 UNITS AND CALL NP. Max daily dose: 36u 30 mL 0   Insulin  Pen Needle (PEN NEEDLES) 32G X 5 MM MISC Use to inject sliding scale Humalog  three times daily. 100 each 6   JARDIANCE  10 MG TABS tablet Take 10 mg by mouth daily.     lidocaine  (LIDODERM ) 5 % Place 1 patch onto the skin daily. Remove & Discard patch within 12 hours or as directed by MD 30 patch 0   LINZESS  145 MCG CAPS capsule Take 1 capsule (145 mcg  total) by mouth daily as needed (constipation). 90 capsule 1   lisinopril  (ZESTRIL ) 20 MG tablet Take 1 tablet (20 mg total) by mouth daily. 90 tablet 1   metFORMIN (GLUCOPHAGE) 500 MG tablet Take 500 mg by mouth daily with breakfast.     metoCLOPramide  (REGLAN ) 10 MG tablet Take 10 mg by mouth every 6 (six) hours as needed.     metoprolol  tartrate (LOPRESSOR ) 25 MG tablet TAKE 1 TABLET BY MOUTH TWICE A DAY 60 tablet 0   norethindrone  (MICRONOR ) 0.35 MG tablet Take 1 tablet (0.35 mg total) by mouth daily. 28 tablet 0   omeprazole  (PRILOSEC) 20 MG capsule TAKE 1 CAPSULE BY MOUTH DAILY IF NEEDED FOR GASTRIC REFLUX 90 capsule 0   ondansetron  (ZOFRAN -ODT) 4 MG disintegrating tablet TAKE 1 TABLET BY MOUTH EVERY 8 HOURS AS NEEDED FOR NAUSEA AND VOMITING 12 tablet 0   OneTouch Delica Lancets 33G MISC USE TO CHECK BLOOD SUGAR THREE TIMES DAILY. 100 each 2   telmisartan (MICARDIS) 80 MG tablet Take 1 tablet by mouth daily.     No current facility-administered medications for this visit.    Medication Side Effects: None  Allergies: Not on File  Past Medical History:  Diagnosis Date   Anxiety    Diabetes mellitus without complication (HCC)    Gastroparesis    Hypertension    Major depressive disorder     Past Medical History, Surgical history, Social history, and Family history were reviewed and updated as appropriate.   Please see review of systems for further details on the patient's review from today.   Objective:   Physical Exam:  There were no  vitals taken for this visit.  Physical Exam Constitutional:      General: She is not in acute distress. Musculoskeletal:        General: No deformity.  Neurological:     Mental Status: She is alert and oriented to person, place, and time.     Coordination: Coordination normal.  Psychiatric:        Attention and Perception: Attention and perception normal. She does not perceive auditory or visual hallucinations.        Mood and Affect: Mood is anxious and depressed. Affect is not blunt, angry, tearful or inappropriate.        Speech: Speech normal.        Behavior: Behavior normal.        Thought Content: Thought content normal. Thought content is not paranoid or delusional. Thought content does not include homicidal or suicidal ideation. Thought content does not include suicidal plan.        Cognition and Memory: Cognition and memory normal.        Judgment: Judgment normal.     Comments: Insight intact Dep improving again     Lab Review:     Component Value Date/Time   NA 131 (L) 05/25/2023 2132   NA 141 08/31/2022 0954   K 3.5 05/25/2023 2132   CL 94 (L) 05/25/2023 2132   CO2 25 05/25/2023 2132   GLUCOSE 314 (H) 05/25/2023 2132   BUN 10 05/25/2023 2132   BUN 12 08/31/2022 0954   CREATININE 0.87 05/25/2023 2132   CALCIUM  9.5 05/25/2023 2132   PROT 8.4 (H) 05/25/2023 2132   PROT 7.6 08/31/2022 0954   ALBUMIN 4.4 05/25/2023 2132   ALBUMIN 4.3 08/31/2022 0954   AST 71 (H) 05/25/2023 2132   ALT 86 (H) 05/25/2023 2132   ALKPHOS 70 05/25/2023 2132  BILITOT 1.0 05/25/2023 2132   BILITOT 0.3 08/31/2022 0954   GFRNONAA >60 05/25/2023 2132   GFRAA >60 10/10/2019 1441       Component Value Date/Time   WBC 11.8 (H) 05/25/2023 2132   RBC 4.51 05/25/2023 2132   HGB 15.1 (H) 05/25/2023 2132   HGB 13.9 08/31/2022 0954   HCT 42.4 05/25/2023 2132   HCT 41.0 08/31/2022 0954   PLT 187 05/25/2023 2132   PLT 249 08/31/2022 0954   MCV 94.0 05/25/2023 2132   MCV 96 08/31/2022  0954   MCH 33.5 05/25/2023 2132   MCHC 35.6 05/25/2023 2132   RDW 12.5 05/25/2023 2132   RDW 12.6 08/31/2022 0954   LYMPHSABS 0.5 (L) 05/25/2023 2132   LYMPHSABS 2.3 08/31/2022 0954   MONOABS 0.5 05/25/2023 2132   EOSABS 0.2 05/25/2023 2132   EOSABS 0.3 08/31/2022 0954   BASOSABS 0.0 05/25/2023 2132   BASOSABS 0.1 08/31/2022 0954    No results found for: POCLITH, LITHIUM   No results found for: PHENYTOIN, PHENOBARB, VALPROATE, CBMZ   .res Assessment: Plan:    Gwendolyn Carrillo was seen today for follow-up, depression, anxiety and stress.  Diagnoses and all orders for this visit:  Recurrent major depression resistant to treatment  Generalized anxiety disorder  Panic attacks  Mixed obsessional thoughts and acts  Insomnia, unspecified type  Primary hypertension     Disc her TRD and the SE Spravato .  She is very excited to be receiving this bc poor tolerance AD and no sig benefit with AD. Markedly better with Spravato  and then this weekend recurrence.  Patient was administered Spravato  84 mg intranasally today.  The patient experienced the typical dissociation which gradually resolved over the 2-hour period of observation.  There were no complications.  Specifically the patient did not have nausea or vomiting or headache.  Blood pressures remained within normal ranges at the 40-minute and 2-hour follow-up intervals.  By the time the 2-hour observation period was met the patient was alert and oriented and able to exit without assistance.  Patient feels the Spravato  administration is helpful for the treatment resistant depression and would like to continue the treatment.  See nursing note for further details.  BP issues.  She will discuss with PCP.  Reqwuires clonidine 0.1 mg prn in office and tolerated well.  Continue  84 mg Spravato  bc helps and well tolerated.      No med changes.  Consider alternatives to traditional AD given hx intolerance.  This might help reduce  relapse risk.  Example probiotics, Saffron, low dose lithium.  But will wait longer to bring this up in hopes she will become more comfortable with further med trials.  Disc Saffron dosing bc she is interested in this for depression off label. The Saffron dosage is 15 - 30 mg daily.  It ideally should be split between morning and evening but if needed can be taken once daily. Also rec probiotic with B. Longum an d she agrees.  Follow-up twice weekly for Spravato  administration  Lorene Macintosh MD, DFAPA  Please see After Visit Summary for patient specific instructions.  Future Appointments  Date Time Provider Department Center  03/29/2024  9:30 AM Cottle, Lorene KANDICE Raddle., MD [facility] None  03/29/2024  9:30 AM CP-NURSE [facility] None  04/03/2024  9:30 AM CP-NURSE [facility] None  04/05/2024  9:30 AM CP-NURSE [facility] None  04/10/2024  9:30 AM CP-NURSE [facility] None  04/10/2024  9:30 AM Mozingo, Angeline Mattocks, NP [facility] None  04/12/2024  9:30 AM CP-NURSE [facility] None  04/12/2024  9:30 AM Mozingo, Regina Nattalie, NP [facility] None  04/27/2024 11:30 AM Mozingo, Regina Nattalie, NP [facility] None  05/03/2024 12:00 PM Prentiss Riggs A, NP [facility] None    No orders of the defined types were placed in this encounter.   -------------------------------

## 2024-03-27 NOTE — Progress Notes (Signed)
 NURSES NOTE:         Pt arrived for her 16th Spravato  Treatment for treatment resistant depression, pt will be getting 84 mg (3 of the 28 mg inhalers), for continued maintenance dose. Pt sees [employer] and she will follow up with her for office visits and see her at treatment when she is in the office. Explained to pt how the treatments would be scheduled and answered any questions she had today. Pt's Spravato  is billed through buy and bill medically.  Spravato  medication is stored at treatment center per REMS/FDA guidelines. The medication is required to be locked behind two doors per REMS/FDA protocol. Medication is also disposed of properly after each use per regulations. All documentation for REMS is completed and submitted per FDA/REMS requirements.          Began taking patient's vital signs at 9:45 AM 153/86, pulse 77, SpO2 Sat. 9%. Pt given Clonidine 0.1 mg at start of her treatment. Instructed patient to blow her nose if needed then recline back to a 45 degree angle. Gave patient first dose 28 mg nasal spray, administered in each nostril as directed and observed by nurse, waited 5 more minutes for the second and third doses. After all doses given pt did not complain of any nausea/vomiting, pt did have a drink to help with the taste of Spravato  it gives the metallic taste. Assessed her 40 minute vitals, 10:30 AM, 127/79, pulse 81, SpO2 96%.  Explained she would be monitored for a total time of 120 minutes after beginning treatment. Discharge vitals were taken at 11:40 AM 122/76, P 74, SpO2 96%. Dr. Geoffry came to visit with patient once her thoughts were clearer to discuss how treatment went. Recommend she go home and sleep or just relax on the couch. No driving, no intense activities. Verbalized understanding. Pt will Thursday. Nurse was with pt a total of 60 minutes for clinical assessment.  Pt will get 84 mg.   (84 mg)  LOT [id number] EXP [id number]

## 2024-03-27 NOTE — Progress Notes (Signed)
 Gwendolyn Carrillo [id number] [birth year] 48 y.o.  Subjective:   Patient ID:  Gwendolyn Carrillo is a 48 y.o. (DOB 10/16/1975) female.  Chief Complaint:  Chief Complaint  Patient presents with   Follow-up   Depression   Anxiety   Stress    HPI [name] presents to the office today for follow-up of treatment resistant major depression.  She also has a history of generalized anxiety disorder, OCD, and panic.  She describes a long history of depression and anxiety and has been unable to tolerate most antidepressants.  She is very eager to pursue Spravato  for relief from her depression.    01/31/24 appt noted:  Her PHQ-9 score was 22 today.  She describes symptoms of anhedonia, low motivation, difficulty concentrating, poor productivity, difficulty being around people and wanting to isolate.  She is not suicidal but she has had hopelessness other than this opportunity to pursue Spravato .  She does not feel like she can function at work and is discussing leave of absence with her usual provider [employer], PNP. She was so excited to start treatment today that she had trouble sleeping last night. She received her first administration of Spravato  at 56 mg today.  She had no untoward side effects such as nausea dizziness headache or vomiting.  She did have the expected dissociation but it was not scary.  In fact she experienced a relief from negative emotions such as depression and anxiety while under treatment. She was very drowsy and fell asleep.  She explained it as being due to being sleep deprived.  She is eager to continue the Spravato .  02/02/24 appt noted:  She received her second administration of Spravato  at 56 mg today.  She had no untoward side effects such as nausea dizziness headache or vomiting.  She did have the expected dissociation but it was not scary.  In fact she experienced a relief from negative emotions such as depression and anxiety while under treatment. She was  much less drowsy with it.  Would like to increase dose as planned.  No SI.  Tolerating current tx plan.   02/14/24 appt noted: 2 Spravato  84 mg tx last week as planned Here for Spravato  84 mg today.  Expected dissociation well tolerated without NV, HA.  Some dizziness.  Good emotional relief from negativity immediately from Spravato .  Med: no fluoxetine  bc not tolerated Mood sig better by most in decades with Spravato .  Dep is much better.  Anxiety is better too.  Sleep is ok.  Wants to continue tx.  02/16/24 appt noted:  No psych meds bc not tolerated. Here for Spravato  84 mg today.  Expected dissociation well tolerated without NV, HA.  Some dizziness.  Good emotional relief from negativity immediately from Spravato .  Mood so far dramatically better and anxiety better with Spravato .  No new problems.  Wants to continue Spravato .  02/21/24 appt noted:  No psych meds bc not tolerated. Here for Spravato  84 mg today.  Expected dissociation well tolerated without NV, HA.  Some dizziness.  Good emotional relief from negativity immediately from Spravato .  Dep has gone into remission Spravato  and anxiety is much better .  She is very happy with the tx plan.  No new concerns.    02/28/24 appt noted:  No psych meds bc not tolerated. Here for Spravato  84 mg today.  Expected dissociation well tolerated without NV, HA.  Some dizziness.  Good emotional relief from negativity immediately from Spravato .  Dep unexpectedly  worse Saturday and since.  Having experienced relief she doesn't want to go backwards. There are some stressors contributing to it but she wants to continue Spravato  twice weekly until she gets better.  03/01/24 appt noted: No psych meds bc not tolerated. Here for Spravato  84 mg today.  Expected dissociation well tolerated without NV, HA.  Some dizziness.  Good emotional relief from negativity immediately from Spravato .  Felt need bc relapse dep to increase Spravato  back to twice weekly.    Feels better about getting second spravato  this week.  She believes it will help get her out of the dep like she was before.  Wants to avoid psych med bc hx intolerance.  BP was high this am and required clonidine 0.1 mg which resolved BP px.  She will discuss with PDP later this month.  03/06/24 appt noted: No psych meds bc not tolerated. Here for Spravato  84 mg today.  Expected dissociation well tolerated without NV, HA.  Some dizziness.  Good emotional relief from negativity immediately from Spravato .  Had hypertension this am prior ot Spravato  and was treated with clonidine 0.1 mg which worked.  Results in nurses note. She will talk with PCP on Friday about this bc it has happened several times. Felt need bc relapse dep to increase Spravato  back to twice weekly.  But dep is improving again.  Feels satisfied with tx and wants to continue current plan.  03/08/24 appt noted: No psych meds bc not tolerated. Here for Spravato  84 mg today.  Expected dissociation well tolerated without NV, HA.  Some dizziness.  Good emotional relief from negativity immediately from Spravato .  BP ok today;  Results in nurses note. She will talk with PCP on Friday about this bc it has happened several times. Dep and anxiety are much better with Spravato  and greatly relieved at the benefit for the first time in many years.  03/20/24 appt:  No psych meds bc not tolerated. Here for Spravato  84 mg today.  Expected dissociation well tolerated without NV, HA.  Some dizziness.  Good emotional relief from negativity immediately from Spravato .  BP ok today;  Results in nurses note. PCP didn't change BP meds. Wants to continue twice weekly Spravato  and disc this with provider. Residual dep worse when tries to spread out RX. Feels stressed by work and on [employer]. Residual dep and anxiety  Previous medications: Zoloft , fluoxetine , others,  Clonazepam    ECT-MADRS    Flowsheet Row Video Visit from 01/05/2024 in [name]  MADRS Total Score 31   GAD-7    Flowsheet Row Office Visit from 08/31/2022 in [facility] Office Visit from 03/03/2022 in [facility] Office Visit from 11/25/2021 in [facility] Office Visit from 08/12/2021 in [facility] Office Visit from 04/01/2021 in [facility]  Total GAD-7 Score 12 14 5 8 16    PHQ2-9    Flowsheet Row Video Visit from 01/05/2024 in [name] Office Visit from 08/31/2022 in [facility] Office Visit from 03/03/2022 in [facility] Office Visit from 02/10/2022 in [facility] Office Visit from 11/25/2021 in [facility]  PHQ-2 Total Score 4 4 6 5 2   PHQ-9 Total Score -- 17 14 13 8    Flowsheet Row ED from 05/26/2023 in [facility] Emergency Department at [facility] ED from 12/11/2022  in [facility] Department at [facility] ED from 05/05/2022 in [facility] Emergency Department at [facility]  C-SSRS RISK CATEGORY No Risk No Risk No Risk     Review of Systems:  Review of Systems  Cardiovascular:  Negative for chest pain and palpitations.  Gastrointestinal:  Negative for nausea and vomiting.  Neurological:  Negative for dizziness, weakness and headaches.  Psychiatric/Behavioral:  Positive for dysphoric mood. Negative for decreased concentration and sleep disturbance. The patient is nervous/anxious.     Medications: I have reviewed the patient's current medications.  Current Outpatient Medications  Medication Sig Dispense Refill   ALPRAZolam  (XANAX ) 1 MG tablet TAKE 1 TABLET BY MOUTH 2 TIMES DAILY AS NEEDED FOR ANXIETY. 60 tablet 0   atorvastatin  (LIPITOR) 80 MG tablet Take 1 tablet (80 mg total) by mouth daily. 90 tablet 1   Blood Glucose  Monitoring Suppl (ONETOUCH VERIO) w/Device KIT USE TO CHECK BLOOD SUGAR THREE TIMES DAILY. 1 kit 0   Cholecalciferol (VITAMIN D3) 50 MCG (2000 UT) capsule Take 1 capsule (2,000 Units total) by mouth daily. 90 capsule 1   clindamycin  (CLEOCIN ) 2 % vaginal cream Place 1 Applicatorful vaginally at bedtime. 40 g 0   Continuous Blood Gluc Receiver (DEXCOM G6 RECEIVER) DEVI 1 Bag by Does not apply route in the morning, at noon, and at bedtime. 1 each 0   Continuous Glucose Sensor (DEXCOM G6 SENSOR) MISC USE AS DIRECTED EVERY 10 DAYS 9 each 2   Continuous Glucose Transmitter (DEXCOM G6 TRANSMITTER) MISC USE AS DIRECTED OVER 90 DAYS 1 each 2   Continuous Glucose Transmitter (DEXCOM G6 TRANSMITTER) MISC USE AS DIRECTED OVER 90 DAYS     cyclobenzaprine  (FLEXERIL ) 10 MG tablet Take 1 tablet (10 mg total) by mouth 2 (two) times daily as needed for muscle spasms. 12 tablet 0   Elastic Bandages & Supports (ELBOW COMPRESSION) MISC 1 Application by Does not apply route daily. 1 each 0   glucose blood (ONETOUCH VERIO) test strip USE TO CHECK BLOOD SUGAR THREE TIMES DAILY. 100 each 2   hydrochlorothiazide  (HYDRODIURIL ) 25 MG tablet Take 1 tablet (25 mg total) by mouth daily. 90 tablet 1   hydrOXYzine  (ATARAX ) 25 MG tablet Take 1 tablet (25 mg total) by mouth 3 (three) times daily as needed. 90 tablet 1   Insulin  Glargine (BASAGLAR  KWIKPEN) 100 UNIT/ML Inject 15 Units into the skin daily. 15 mL 0   insulin  lispro (HUMALOG  KWIKPEN) 100 UNIT/ML KwikPen INJECT up to 12u INTO THE SKIN 3 TIMES DAILY WITH MEALS per sliding scale: 200-250: 4 UNITS, 251-300: 6 UNITS, 301-350: 8 UNITS, 351-400: 10 UNITS,>400:12 UNITS AND CALL NP. Max daily dose: 36u 30 mL 0   Insulin  Pen Needle (PEN NEEDLES) 32G X 5 MM MISC Use to inject sliding scale Humalog  three times daily. 100 each 6   JARDIANCE  10 MG TABS tablet Take 10 mg by mouth daily.     lidocaine  (LIDODERM ) 5 % Place 1 patch onto the skin daily. Remove & Discard patch within 12  hours or as directed by MD 30 patch 0   LINZESS  145 MCG CAPS capsule Take 1 capsule (145 mcg total) by mouth daily as needed (constipation). 90 capsule 1   lisinopril  (ZESTRIL ) 20 MG tablet Take 1 tablet (20 mg total) by mouth daily. 90 tablet 1   metFORMIN (GLUCOPHAGE) 500 MG tablet Take 500 mg by mouth daily with breakfast.     metoCLOPramide  (REGLAN ) 10 MG tablet Take 10 mg by mouth  every 6 (six) hours as needed.     metoprolol  tartrate (LOPRESSOR ) 25 MG tablet TAKE 1 TABLET BY MOUTH TWICE A DAY 60 tablet 0   norethindrone  (MICRONOR ) 0.35 MG tablet Take 1 tablet (0.35 mg total) by mouth daily. 28 tablet 0   omeprazole  (PRILOSEC) 20 MG capsule TAKE 1 CAPSULE BY MOUTH DAILY IF NEEDED FOR GASTRIC REFLUX 90 capsule 0   ondansetron  (ZOFRAN -ODT) 4 MG disintegrating tablet TAKE 1 TABLET BY MOUTH EVERY 8 HOURS AS NEEDED FOR NAUSEA AND VOMITING 12 tablet 0   OneTouch Delica Lancets 33G MISC USE TO CHECK BLOOD SUGAR THREE TIMES DAILY. 100 each 2   telmisartan (MICARDIS) 80 MG tablet Take 1 tablet by mouth daily.     No current facility-administered medications for this visit.    Medication Side Effects: None  Allergies: Not on File  Past Medical History:  Diagnosis Date   Anxiety    Diabetes mellitus without complication (HCC)    Gastroparesis    Hypertension    Major depressive disorder     Past Medical History, Surgical history, Social history, and Family history were reviewed and updated as appropriate.   Please see review of systems for further details on the patient's review from today.   Objective:   Physical Exam:  There were no vitals taken for this visit.  Physical Exam Constitutional:      General: She is not in acute distress. Musculoskeletal:        General: No deformity.  Neurological:     Mental Status: She is alert and oriented to person, place, and time.     Coordination: Coordination normal.  Psychiatric:        Attention and Perception: Attention and  perception normal. She does not perceive auditory or visual hallucinations.        Mood and Affect: Mood is anxious and depressed. Affect is not blunt, angry, tearful or inappropriate.        Speech: Speech normal.        Behavior: Behavior normal.        Thought Content: Thought content normal. Thought content is not paranoid or delusional. Thought content does not include homicidal or suicidal ideation. Thought content does not include suicidal plan.        Cognition and Memory: Cognition and memory normal.        Judgment: Judgment normal.     Comments: Insight intact Dep improving again     Lab Review:     Component Value Date/Time   NA 131 (L) 05/25/2023 2132   NA 141 08/31/2022 0954   K 3.5 05/25/2023 2132   CL 94 (L) 05/25/2023 2132   CO2 25 05/25/2023 2132   GLUCOSE 314 (H) 05/25/2023 2132   BUN 10 05/25/2023 2132   BUN 12 08/31/2022 0954   CREATININE 0.87 05/25/2023 2132   CALCIUM  9.5 05/25/2023 2132   PROT 8.4 (H) 05/25/2023 2132   PROT 7.6 08/31/2022 0954   ALBUMIN 4.4 05/25/2023 2132   ALBUMIN 4.3 08/31/2022 0954   AST 71 (H) 05/25/2023 2132   ALT 86 (H) 05/25/2023 2132   ALKPHOS 70 05/25/2023 2132   BILITOT 1.0 05/25/2023 2132   BILITOT 0.3 08/31/2022 0954   GFRNONAA >60 05/25/2023 2132   GFRAA >60 10/10/2019 1441       Component Value Date/Time   WBC 11.8 (H) 05/25/2023 2132   RBC 4.51 05/25/2023 2132   HGB 15.1 (H) 05/25/2023 2132   HGB 13.9 08/31/2022 0954  HCT 42.4 05/25/2023 2132   HCT 41.0 08/31/2022 0954   PLT 187 05/25/2023 2132   PLT 249 08/31/2022 0954   MCV 94.0 05/25/2023 2132   MCV 96 08/31/2022 0954   MCH 33.5 05/25/2023 2132   MCHC 35.6 05/25/2023 2132   RDW 12.5 05/25/2023 2132   RDW 12.6 08/31/2022 0954   LYMPHSABS 0.5 (L) 05/25/2023 2132   LYMPHSABS 2.3 08/31/2022 0954   MONOABS 0.5 05/25/2023 2132   EOSABS 0.2 05/25/2023 2132   EOSABS 0.3 08/31/2022 0954   BASOSABS 0.0 05/25/2023 2132   BASOSABS 0.1 08/31/2022 0954    No  results found for: POCLITH, LITHIUM   No results found for: PHENYTOIN, PHENOBARB, VALPROATE, CBMZ   .res Assessment: Plan:    Amaiyah was seen today for follow-up, depression, anxiety and stress.  Diagnoses and all orders for this visit:  Recurrent major depression resistant to treatment  Generalized anxiety disorder  Panic attacks  Mixed obsessional thoughts and acts  Insomnia, unspecified type  Primary hypertension    Disc her TRD and the SE Spravato .  She is very excited to be receiving this bc poor tolerance AD and no sig benefit with AD. Markedly better with Spravato  and then this weekend recurrence.  Patient was administered Spravato  84 mg intranasally today.  The patient experienced the typical dissociation which gradually resolved over the 2-hour period of observation.  There were no complications.  Specifically the patient did not have nausea or vomiting or headache.  Blood pressures remained within normal ranges at the 40-minute and 2-hour follow-up intervals.  By the time the 2-hour observation period was met the patient was alert and oriented and able to exit without assistance.  Patient feels the Spravato  administration is helpful for the treatment resistant depression and would like to continue the treatment.  See nursing note for further details.  BP issues.  She will discuss with PCP.  Reqwuires clonidine 0.1 mg prn in office and tolerated well.  Continue  84 mg Spravato  bc helps and well tolerated.      No med changes.  Consider alternatives to traditional AD given hx intolerance.  This might help reduce relapse risk.  Example probiotics, Saffron, low dose lithium.  But will wait longer to bring this up in hopes she will become more comfortable with further med trials. Disc Saffron and dosing.  Follow-up twice weekly for Spravato  administration   Lorene Macintosh MD, DFAPA  Please see After Visit Summary for patient specific instructions.  Future  Appointments  Date Time Provider Department Center  03/29/2024  9:30 AM Cottle, Lorene KANDICE Raddle., MD [facility] None  03/29/2024  9:30 AM CP-NURSE [facility] None  04/03/2024  9:30 AM CP-NURSE [facility] None  04/05/2024  9:30 AM CP-NURSE [facility] None  04/10/2024  9:30 AM CP-NURSE [facility] None  04/10/2024  9:30 AM Mozingo, Angeline Mattocks, NP [facility] None  04/12/2024  9:30 AM CP-NURSE [facility] None  04/12/2024  9:30 AM Mozingo, Regina Nattalie, NP [facility] None  04/27/2024 11:30 AM Mozingo, Regina Nattalie, NP [facility] None  05/03/2024 12:00 PM Prentiss Riggs A, NP [facility] None    No orders of the defined types were placed in this encounter.   -------------------------------

## 2024-03-29 ENCOUNTER — Encounter: Payer: Self-pay | Admitting: Psychiatry

## 2024-03-29 ENCOUNTER — Ambulatory Visit: Admitting: Psychiatry

## 2024-03-29 ENCOUNTER — Ambulatory Visit

## 2024-03-29 VITALS — BP 155/95 | HR 77

## 2024-03-29 DIAGNOSIS — I10 Essential (primary) hypertension: Secondary | ICD-10-CM

## 2024-03-29 DIAGNOSIS — F422 Mixed obsessional thoughts and acts: Secondary | ICD-10-CM

## 2024-03-29 DIAGNOSIS — F339 Major depressive disorder, recurrent, unspecified: Secondary | ICD-10-CM

## 2024-03-29 DIAGNOSIS — G4700 Insomnia, unspecified: Secondary | ICD-10-CM

## 2024-03-29 DIAGNOSIS — F411 Generalized anxiety disorder: Secondary | ICD-10-CM

## 2024-03-29 DIAGNOSIS — F410 Panic disorder [episodic paroxysmal anxiety] without agoraphobia: Secondary | ICD-10-CM

## 2024-03-29 NOTE — Progress Notes (Signed)
 NURSES NOTE:         Pt arrived for her 17th Spravato  Treatment for treatment resistant depression, pt will be getting 84 mg (3 of the 28 mg inhalers), for continued maintenance dose. Pt sees [employer] and she will follow up with her for office visits and see her at treatment when she is in the office. Explained to pt how the treatments would be scheduled and answered any questions she had today. Pt's Spravato  is billed through buy and bill medically.  Spravato  medication is stored at treatment center per REMS/FDA guidelines. The medication is required to be locked behind two doors per REMS/FDA protocol. Medication is also disposed of properly after each use per regulations. All documentation for REMS is completed and submitted per FDA/REMS requirements.          Began taking patient's vital signs at 9:25 AM 156/95, pulse 73, SpO2 Sat. 97%, rechecked 5 minutes later after relaxing 136/86, pulse 71. Instructed patient to blow her nose if needed then recline back to a 45 degree angle. Gave patient first dose 28 mg nasal spray, administered in each nostril as directed and observed by nurse, waited 5 more minutes for the second and third doses. After all doses given pt did not complain of any nausea/vomiting, pt did have a drink to help with the taste of Spravato  it gives the metallic taste. Assessed her 40 minute vitals, 10:12 AM, 142/93, pulse 76, SpO2 96%.  Explained she would be monitored for a total time of 120 minutes after beginning treatment. Discharge vitals were taken at 11:34 AM 155/95, P 77, SpO2 98%. Dr. Geoffry came to visit with patient once her thoughts were clearer to discuss how treatment went. Recommend she go home and sleep or just relax on the couch. No driving, no intense activities. Verbalized understanding. Pt will return next Tuesday andThursday. Nurse was with pt a total of 60 minutes for clinical assessment.  Pt will get 84 mg.   (84 mg)  LOT [id number] EXP [id number]

## 2024-03-29 NOTE — Progress Notes (Signed)
 [name] [id number] 12-06-1975 48 y.o.  Subjective:   Patient ID:  Gwendolyn Carrillo is a 48 y.o. (DOB 09/15/1975) female.  Chief Complaint:  Chief Complaint  Patient presents with   Follow-up   Depression   Anxiety   Stress    HPI Gwendolyn Carrillo presents to the office today for follow-up of treatment resistant major depression.  She also has a history of generalized anxiety disorder, OCD, and panic.  She describes a long history of depression and anxiety and has been unable to tolerate most antidepressants.  She is very eager to pursue Spravato  for relief from her depression.    01/31/24 appt noted:  Her PHQ-9 score was 22 today.  She describes symptoms of anhedonia, low motivation, difficulty concentrating, poor productivity, difficulty being around people and wanting to isolate.  She is not suicidal but she has had hopelessness other than this opportunity to pursue Spravato .  She does not feel like she can function at work and is discussing leave of absence with her usual provider [employer], PNP. She was so excited to start treatment today that she had trouble sleeping last night. She received her first administration of Spravato  at 56 mg today.  She had no untoward side effects such as nausea dizziness headache or vomiting.  She did have the expected dissociation but it was not scary.  In fact she experienced a relief from negative emotions such as depression and anxiety while under treatment. She was very drowsy and fell asleep.  She explained it as being due to being sleep deprived.  She is eager to continue the Spravato .  02/02/24 appt noted:  She received her second administration of Spravato  at 56 mg today.  She had no untoward side effects such as nausea dizziness headache or vomiting.  She did have the expected dissociation but it was not scary.  In fact she experienced a relief from negative emotions such as depression and anxiety while under treatment. She was  much less drowsy with it.  Would like to increase dose as planned.  No SI.  Tolerating current tx plan.   02/14/24 appt noted: 2 Spravato  84 mg tx last week as planned Here for Spravato  84 mg today.  Expected dissociation well tolerated without NV, HA.  Some dizziness.  Good emotional relief from negativity immediately from Spravato .  Med: no fluoxetine  bc not tolerated Mood sig better by most in decades with Spravato .  Dep is much better.  Anxiety is better too.  Sleep is ok.  Wants to continue tx.  02/16/24 appt noted:  No psych meds bc not tolerated. Here for Spravato  84 mg today.  Expected dissociation well tolerated without NV, HA.  Some dizziness.  Good emotional relief from negativity immediately from Spravato .  Mood so far dramatically better and anxiety better with Spravato .  No new problems.  Wants to continue Spravato .  02/21/24 appt noted:  No psych meds bc not tolerated. Here for Spravato  84 mg today.  Expected dissociation well tolerated without NV, HA.  Some dizziness.  Good emotional relief from negativity immediately from Spravato .  Dep has gone into remission Spravato  and anxiety is much better .  She is very happy with the tx plan.  No new concerns.    02/28/24 appt noted:  No psych meds bc not tolerated. Here for Spravato  84 mg today.  Expected dissociation well tolerated without NV, HA.  Some dizziness.  Good emotional relief from negativity immediately from Spravato .  Dep unexpectedly  worse Saturday and since.  Having experienced relief she doesn't want to go backwards. There are some stressors contributing to it but she wants to continue Spravato  twice weekly until she gets better.  03/01/24 appt noted: No psych meds bc not tolerated. Here for Spravato  84 mg today.  Expected dissociation well tolerated without NV, HA.  Some dizziness.  Good emotional relief from negativity immediately from Spravato .  Felt need bc relapse dep to increase Spravato  back to twice weekly.    Feels better about getting second spravato  this week.  She believes it will help get her out of the dep like she was before.  Wants to avoid psych med bc hx intolerance.  BP was high this am and required clonidine 0.1 mg which resolved BP px.  She will discuss with PDP later this month.  03/06/24 appt noted: No psych meds bc not tolerated. Here for Spravato  84 mg today.  Expected dissociation well tolerated without NV, HA.  Some dizziness.  Good emotional relief from negativity immediately from Spravato .  Had hypertension this am prior ot Spravato  and was treated with clonidine 0.1 mg which worked.  Results in nurses note. She will talk with PCP on Friday about this bc it has happened several times. Felt need bc relapse dep to increase Spravato  back to twice weekly.  But dep is improving again.  Feels satisfied with tx and wants to continue current plan.  03/08/24 appt noted: No psych meds bc not tolerated. Here for Spravato  84 mg today.  Expected dissociation well tolerated without NV, HA.  Some dizziness.  Good emotional relief from negativity immediately from Spravato .  BP ok today;  Results in nurses note. She will talk with PCP on Friday about this bc it has happened several times. Dep and anxiety are much better with Spravato  and greatly relieved at the benefit for the first time in many years.  03/27/24 appt noted:  No psych meds bc not tolerated. Here for Spravato  84 mg today.  Expected dissociation well tolerated without NV, HA.  Some dizziness.  Good emotional relief from negativity immediately from Spravato .  Results in nurses note. BP mildly elevated at imes today Received clonidine 0.1 mg today and BP normalized. Mood overall  much better with Spravato .However she does have dips if she waits longer than a few days between Spravato  administration.  She feels the twice weekly is more effective at keeping her depression under control.  She has had a little increase in depression since  only receiving 1 Spravato  administration last week.  There are other factors such as work stress that are contributing to a sense of depression and worry.  She has discussed this with her other provider.  03/29/24 appt noted:  No psych meds except alprazolam  1 mg BID prn;  bc not tolerated. Here for Spravato  84 mg today.  Expected dissociation well tolerated without NV, HA.  Some dizziness.  Good emotional relief from negativity immediately from Spravato .  Results in nurses note. BP intermittently high.    Previous medications: Zoloft , fluoxetine , others,  Clonazepam    ECT-MADRS    Flowsheet Row Video Visit from 01/05/2024 in [name]  MADRS Total Score 31   GAD-7    Flowsheet Row Office Visit from 08/31/2022 in [facility] Office Visit from 03/03/2022 in [facility] Office Visit from 11/25/2021 in [facility] Office Visit from 08/12/2021 in [facility] Office Visit from  04/01/2021 in [facility]  Total GAD-7 Score 12 14 5 8 16    PHQ2-9    Flowsheet Row Video Visit from 01/05/2024 in [name] Office Visit from 08/31/2022 in [facility] Office Visit from 03/03/2022 in [facility] Office Visit from 02/10/2022 in [facility] Office Visit from 11/25/2021 in [facility]  PHQ-2 Total Score 4 4 6 5 2   PHQ-9 Total Score -- 17 14 13 8    Flowsheet Row ED from 05/26/2023 in [facility] Emergency Department at [facility] ED from 12/11/2022 in [facility] Emergency Department at [facility] ED from 05/05/2022 in [facility]  C-SSRS RISK CATEGORY No Risk No Risk No Risk     Review of Systems:  Review of Systems  Cardiovascular:  Negative for  chest pain and palpitations.  Gastrointestinal:  Negative for nausea and vomiting.  Neurological:  Negative for dizziness, weakness and headaches.  Psychiatric/Behavioral:  Positive for dysphoric mood. Negative for decreased concentration and sleep disturbance. The patient is nervous/anxious.     Medications: I have reviewed the patient's current medications.  Current Outpatient Medications  Medication Sig Dispense Refill   ALPRAZolam  (XANAX ) 1 MG tablet TAKE 1 TABLET BY MOUTH 2 TIMES DAILY AS NEEDED FOR ANXIETY. 60 tablet 0   atorvastatin  (LIPITOR) 80 MG tablet Take 1 tablet (80 mg total) by mouth daily. 90 tablet 1   Blood Glucose Monitoring Suppl (ONETOUCH VERIO) w/Device KIT USE TO CHECK BLOOD SUGAR THREE TIMES DAILY. 1 kit 0   Cholecalciferol (VITAMIN D3) 50 MCG (2000 UT) capsule Take 1 capsule (2,000 Units total) by mouth daily. 90 capsule 1   clindamycin  (CLEOCIN ) 2 % vaginal cream Place 1 Applicatorful vaginally at bedtime. 40 g 0   Continuous Blood Gluc Receiver (DEXCOM G6 RECEIVER) DEVI 1 Bag by Does not apply route in the morning, at noon, and at bedtime. 1 each 0   Continuous Glucose Sensor (DEXCOM G6 SENSOR) MISC USE AS DIRECTED EVERY 10 DAYS 9 each 2   Continuous Glucose Transmitter (DEXCOM G6 TRANSMITTER) MISC USE AS DIRECTED OVER 90 DAYS 1 each 2   Continuous Glucose Transmitter (DEXCOM G6 TRANSMITTER) MISC USE AS DIRECTED OVER 90 DAYS     cyclobenzaprine  (FLEXERIL ) 10 MG tablet Take 1 tablet (10 mg total) by mouth 2 (two) times daily as needed for muscle spasms. 12 tablet 0   Elastic Bandages & Supports (ELBOW COMPRESSION) MISC 1 Application by Does not apply route daily. 1 each 0   glucose blood (ONETOUCH VERIO) test strip USE TO CHECK BLOOD SUGAR THREE TIMES DAILY. 100 each 2   hydrochlorothiazide  (HYDRODIURIL ) 25 MG tablet Take 1 tablet (25 mg total) by mouth daily. 90 tablet 1   hydrOXYzine  (ATARAX ) 25 MG tablet Take 1 tablet (25 mg total) by mouth 3 (three) times daily as  needed. 90 tablet 1   Insulin  Glargine (BASAGLAR  KWIKPEN) 100 UNIT/ML Inject 15 Units into the skin daily. 15 mL 0   insulin  lispro (HUMALOG  KWIKPEN) 100 UNIT/ML KwikPen INJECT up to 12u INTO THE SKIN 3 TIMES DAILY WITH MEALS per sliding scale: 200-250: 4 UNITS, 251-300: 6 UNITS, 301-350: 8 UNITS, 351-400: 10 UNITS,>400:12 UNITS AND CALL NP. Max daily dose: 36u 30 mL 0   Insulin  Pen Needle (PEN NEEDLES) 32G X 5 MM MISC Use to inject sliding scale Humalog  three times daily. 100 each 6  JARDIANCE  10 MG TABS tablet Take 10 mg by mouth daily.     lidocaine  (LIDODERM ) 5 % Place 1 patch onto the skin daily. Remove & Discard patch within 12 hours or as directed by MD 30 patch 0   LINZESS  145 MCG CAPS capsule Take 1 capsule (145 mcg total) by mouth daily as needed (constipation). 90 capsule 1   lisinopril  (ZESTRIL ) 20 MG tablet Take 1 tablet (20 mg total) by mouth daily. 90 tablet 1   metFORMIN (GLUCOPHAGE) 500 MG tablet Take 500 mg by mouth daily with breakfast.     metoCLOPramide  (REGLAN ) 10 MG tablet Take 10 mg by mouth every 6 (six) hours as needed.     metoprolol  tartrate (LOPRESSOR ) 25 MG tablet TAKE 1 TABLET BY MOUTH TWICE A DAY 60 tablet 0   norethindrone  (MICRONOR ) 0.35 MG tablet Take 1 tablet (0.35 mg total) by mouth daily. 28 tablet 0   omeprazole  (PRILOSEC) 20 MG capsule TAKE 1 CAPSULE BY MOUTH DAILY IF NEEDED FOR GASTRIC REFLUX 90 capsule 0   ondansetron  (ZOFRAN -ODT) 4 MG disintegrating tablet TAKE 1 TABLET BY MOUTH EVERY 8 HOURS AS NEEDED FOR NAUSEA AND VOMITING 12 tablet 0   OneTouch Delica Lancets 33G MISC USE TO CHECK BLOOD SUGAR THREE TIMES DAILY. 100 each 2   telmisartan (MICARDIS) 80 MG tablet Take 1 tablet by mouth daily.     No current facility-administered medications for this visit.    Medication Side Effects: None  Allergies: Not on File  Past Medical History:  Diagnosis Date   Anxiety    Diabetes mellitus without complication (HCC)    Gastroparesis    Hypertension     Major depressive disorder     Past Medical History, Surgical history, Social history, and Family history were reviewed and updated as appropriate.   Please see review of systems for further details on the patient's review from today.   Objective:   Physical Exam:  There were no vitals taken for this visit.  Physical Exam Constitutional:      General: She is not in acute distress. Musculoskeletal:        General: No deformity.  Neurological:     Mental Status: She is alert and oriented to person, place, and time.     Coordination: Coordination normal.  Psychiatric:        Attention and Perception: Attention and perception normal. She does not perceive auditory or visual hallucinations.        Mood and Affect: Mood is anxious and depressed. Affect is not blunt, angry, tearful or inappropriate.        Speech: Speech normal.        Behavior: Behavior normal.        Thought Content: Thought content normal. Thought content is not paranoid or delusional. Thought content does not include homicidal or suicidal ideation. Thought content does not include suicidal plan.        Cognition and Memory: Cognition and memory normal.        Judgment: Judgment normal.     Comments: Insight intact Dep improving again     Lab Review:     Component Value Date/Time   NA 131 (L) 05/25/2023 2132   NA 141 08/31/2022 0954   K 3.5 05/25/2023 2132   CL 94 (L) 05/25/2023 2132   CO2 25 05/25/2023 2132   GLUCOSE 314 (H) 05/25/2023 2132   BUN 10 05/25/2023 2132   BUN 12 08/31/2022 0954   CREATININE 0.87 05/25/2023 2132  CALCIUM  9.5 05/25/2023 2132   PROT 8.4 (H) 05/25/2023 2132   PROT 7.6 08/31/2022 0954   ALBUMIN 4.4 05/25/2023 2132   ALBUMIN 4.3 08/31/2022 0954   AST 71 (H) 05/25/2023 2132   ALT 86 (H) 05/25/2023 2132   ALKPHOS 70 05/25/2023 2132   BILITOT 1.0 05/25/2023 2132   BILITOT 0.3 08/31/2022 0954   GFRNONAA >60 05/25/2023 2132   GFRAA >60 10/10/2019 1441       Component Value  Date/Time   WBC 11.8 (H) 05/25/2023 2132   RBC 4.51 05/25/2023 2132   HGB 15.1 (H) 05/25/2023 2132   HGB 13.9 08/31/2022 0954   HCT 42.4 05/25/2023 2132   HCT 41.0 08/31/2022 0954   PLT 187 05/25/2023 2132   PLT 249 08/31/2022 0954   MCV 94.0 05/25/2023 2132   MCV 96 08/31/2022 0954   MCH 33.5 05/25/2023 2132   MCHC 35.6 05/25/2023 2132   RDW 12.5 05/25/2023 2132   RDW 12.6 08/31/2022 0954   LYMPHSABS 0.5 (L) 05/25/2023 2132   LYMPHSABS 2.3 08/31/2022 0954   MONOABS 0.5 05/25/2023 2132   EOSABS 0.2 05/25/2023 2132   EOSABS 0.3 08/31/2022 0954   BASOSABS 0.0 05/25/2023 2132   BASOSABS 0.1 08/31/2022 0954    No results found for: POCLITH, LITHIUM   No results found for: PHENYTOIN, PHENOBARB, VALPROATE, CBMZ   .res Assessment: Plan:    Zainah was seen today for follow-up, depression, anxiety and stress.  Diagnoses and all orders for this visit:  Recurrent major depression resistant to treatment  Generalized anxiety disorder  Panic attacks  Mixed obsessional thoughts and acts  Insomnia, unspecified type  Primary hypertension   Disc her TRD and the SE Spravato .  She is very excited to be receiving this bc poor tolerance AD and no sig benefit with AD. Markedly better with Spravato  and then this weekend recurrence.  Patient was administered Spravato  84 mg intranasally today.  The patient experienced the typical dissociation which gradually resolved over the 2-hour period of observation.  There were no complications.  Specifically the patient did not have nausea or vomiting or headache.  Blood pressures remained within normal ranges at the 40-minute and 2-hour follow-up intervals.  By the time the 2-hour observation period was met the patient was alert and oriented and able to exit without assistance.  Patient feels the Spravato  administration is helpful for the treatment resistant depression and would like to continue the treatment.  See nursing note for  further details.  BP issues.  She will discuss with PCP.  Reqwuires clonidine 0.1 mg prn in office and tolerated well.  Continue  84 mg Spravato  bc helps and well tolerated.      No med changes.  Consider alternatives to traditional AD given hx intolerance.  This might help reduce relapse risk.  Example probiotics, Saffron, low dose lithium.  But will wait longer to bring this up in hopes she will become more comfortable with further med trials.  Disc Saffron dosing bc she is interested in this for depression off label. The Saffron dosage is 15 - 30 mg daily.  It ideally should be split between morning and evening but if needed can be taken once daily. Also rec probiotic with B. Longum an d she agrees to consider it, bc it is natural.  Follow-up twice weekly for Spravato  administration  Lorene Macintosh MD, DFAPA  Please see After Visit Summary for patient specific instructions.  Future Appointments  Date Time Provider Department Center  04/03/2024  9:30 AM Cottle, Lorene KANDICE Raddle., MD [facility] None  04/03/2024  9:30 AM CP-NURSE [facility] None  04/05/2024  9:30 AM Cottle, Lorene KANDICE Raddle., MD [facility] None  04/05/2024  9:30 AM CP-NURSE [facility] None  04/10/2024  9:30 AM CP-NURSE [facility] None  04/10/2024  9:30 AM Mozingo, Angeline Mattocks, NP [facility] None  04/12/2024  9:30 AM CP-NURSE [facility] None  04/12/2024  9:30 AM Mozingo, Regina Nattalie, NP [facility] None  04/27/2024 11:30 AM Mozingo, Regina Nattalie, NP [facility] None  05/03/2024 12:00 PM Prentiss Riggs A, NP [facility] None    No orders of the defined types were placed in this encounter.   -------------------------------

## 2024-03-31 ENCOUNTER — Other Ambulatory Visit: Payer: Self-pay | Admitting: Adult Health

## 2024-03-31 DIAGNOSIS — G4700 Insomnia, unspecified: Secondary | ICD-10-CM

## 2024-03-31 DIAGNOSIS — F410 Panic disorder [episodic paroxysmal anxiety] without agoraphobia: Secondary | ICD-10-CM

## 2024-04-03 ENCOUNTER — Encounter: Payer: Self-pay | Admitting: Psychiatry

## 2024-04-03 ENCOUNTER — Ambulatory Visit: Admitting: Psychiatry

## 2024-04-03 ENCOUNTER — Ambulatory Visit

## 2024-04-03 VITALS — BP 144/97 | HR 81

## 2024-04-03 DIAGNOSIS — F339 Major depressive disorder, recurrent, unspecified: Secondary | ICD-10-CM

## 2024-04-03 DIAGNOSIS — F411 Generalized anxiety disorder: Secondary | ICD-10-CM

## 2024-04-03 DIAGNOSIS — F422 Mixed obsessional thoughts and acts: Secondary | ICD-10-CM

## 2024-04-03 DIAGNOSIS — F410 Panic disorder [episodic paroxysmal anxiety] without agoraphobia: Secondary | ICD-10-CM

## 2024-04-03 DIAGNOSIS — I10 Essential (primary) hypertension: Secondary | ICD-10-CM

## 2024-04-03 DIAGNOSIS — G4700 Insomnia, unspecified: Secondary | ICD-10-CM

## 2024-04-03 MED ORDER — PRAMIPEXOLE DIHYDROCHLORIDE 0.25 MG PO TABS: 0.2500 mg | ORAL_TABLET | Freq: Two times a day (BID) | ORAL | 0 refills | Status: DC

## 2024-04-03 NOTE — Progress Notes (Signed)
 NURSES NOTE:         Pt arrived for her 18th Spravato  Treatment for treatment resistant depression, pt will be getting 84 mg (3 of the 28 mg inhalers), for continued maintenance dose. Pt sees [employer] and she will follow up with her for office visits and see her at treatment when she is in the office. Explained to pt how the treatments would be scheduled and answered any questions she had today. Pt's Spravato  is billed through buy and bill medically.  Spravato  medication is stored at treatment center per REMS/FDA guidelines. The medication is required to be locked behind two doors per REMS/FDA protocol. Medication is also disposed of properly after each use per regulations. All documentation for REMS is completed and submitted per FDA/REMS requirements.          Began taking patient's vital signs at 9:39 AM 142/87, pulse 74, SpO2 Sat. 98%. Vital signs appropriate to start treatment. Instructed patient to blow her nose if needed then recline back to a 45 degree angle. Gave patient first dose 28 mg nasal spray, administered in each nostril as directed and observed by nurse, waited 5 more minutes for the second and third doses. After all doses given pt did not complain of any nausea/vomiting, pt did have a drink to help with the taste of Spravato  it gives the metallic taste. Assessed her 40 minute vitals, 10:20 AM, 153/93, pulse 77, SpO2 98%.  Explained she would be monitored for a total time of 120 minutes after beginning treatment. Discharge vitals were taken at 11:31 AM 144/97, P 81, SpO2 98%. Dr. Geoffry came to visit with patient once her thoughts were clearer to discuss how treatment went. Recommend she go home and sleep or just relax on the couch. No driving, no intense activities. Verbalized understanding. Pt will return Thursday. Nurse was with pt a total of 60 minutes for clinical assessment.  Pt will get 84 mg.   (84 mg)  LOT [id number] EXP [id number]

## 2024-04-03 NOTE — Progress Notes (Signed)
 Gwendolyn Carrillo [id number] 02-17-1976 48 y.o.  Subjective:   Patient ID:  Gwendolyn Carrillo is a 48 y.o. (DOB April 28, 1975) female.  Chief Complaint:  Chief Complaint  Patient presents with   Follow-up   Depression   Anxiety   Stress    HPI Gwendolyn Carrillo presents to the office today for follow-up of treatment resistant major depression.  She also has a history of generalized anxiety disorder, OCD, and panic.  She describes a long history of depression and anxiety and has been unable to tolerate most antidepressants.  She is very eager to pursue Spravato  for relief from her depression.    01/31/24 appt noted:  Her PHQ-9 score was 22 today.  She describes symptoms of anhedonia, low motivation, difficulty concentrating, poor productivity, difficulty being around people and wanting to isolate.  She is not suicidal but she has had hopelessness other than this opportunity to pursue Spravato .  She does not feel like she can function at work and is discussing leave of absence with her usual provider [employer], PNP. She was so excited to start treatment today that she had trouble sleeping last night. She received her first administration of Spravato  at 56 mg today.  She had no untoward side effects such as nausea dizziness headache or vomiting.  She did have the expected dissociation but it was not scary.  In fact she experienced a relief from negative emotions such as depression and anxiety while under treatment. She was very drowsy and fell asleep.  She explained it as being due to being sleep deprived.  She is eager to continue the Spravato .  02/02/24 appt noted:  She received her second administration of Spravato  at 56 mg today.  She had no untoward side effects such as nausea dizziness headache or vomiting.  She did have the expected dissociation but it was not scary.  In fact she experienced a relief from negative emotions such as depression and anxiety while under treatment. She was  much less drowsy with it.  Would like to increase dose as planned.  No SI.  Tolerating current tx plan.   02/14/24 appt noted: 2 Spravato  84 mg tx last week as planned Here for Spravato  84 mg today.  Expected dissociation well tolerated without NV, HA.  Some dizziness.  Good emotional relief from negativity immediately from Spravato .  Med: no fluoxetine  bc not tolerated Mood sig better by most in decades with Spravato .  Dep is much better.  Anxiety is better too.  Sleep is ok.  Wants to continue tx.  02/16/24 appt noted:  No psych meds bc not tolerated. Here for Spravato  84 mg today.  Expected dissociation well tolerated without NV, HA.  Some dizziness.  Good emotional relief from negativity immediately from Spravato .  Mood so far dramatically better and anxiety better with Spravato .  No new problems.  Wants to continue Spravato .  02/21/24 appt noted:  No psych meds bc not tolerated. Here for Spravato  84 mg today.  Expected dissociation well tolerated without NV, HA.  Some dizziness.  Good emotional relief from negativity immediately from Spravato .  Dep has gone into remission Spravato  and anxiety is much better .  She is very happy with the tx plan.  No new concerns.    02/28/24 appt noted:  No psych meds bc not tolerated. Here for Spravato  84 mg today.  Expected dissociation well tolerated without NV, HA.  Some dizziness.  Good emotional relief from negativity immediately from Spravato .  Dep unexpectedly  worse Saturday and since.  Having experienced relief she doesn't want to go backwards. There are some stressors contributing to it but she wants to continue Spravato  twice weekly until she gets better.  03/01/24 appt noted: No psych meds bc not tolerated. Here for Spravato  84 mg today.  Expected dissociation well tolerated without NV, HA.  Some dizziness.  Good emotional relief from negativity immediately from Spravato .  Felt need bc relapse dep to increase Spravato  back to twice weekly.    Feels better about getting second spravato  this week.  She believes it will help get her out of the dep like she was before.  Wants to avoid psych med bc hx intolerance.  BP was high this am and required clonidine 0.1 mg which resolved BP px.  She will discuss with PDP later this month.  03/06/24 appt noted: No psych meds bc not tolerated. Here for Spravato  84 mg today.  Expected dissociation well tolerated without NV, HA.  Some dizziness.  Good emotional relief from negativity immediately from Spravato .  Had hypertension this am prior ot Spravato  and was treated with clonidine 0.1 mg which worked.  Results in nurses note. She will talk with PCP on Friday about this bc it has happened several times. Felt need bc relapse dep to increase Spravato  back to twice weekly.  But dep is improving again.  Feels satisfied with tx and wants to continue current plan.  03/08/24 appt noted: No psych meds bc not tolerated. Here for Spravato  84 mg today.  Expected dissociation well tolerated without NV, HA.  Some dizziness.  Good emotional relief from negativity immediately from Spravato .  BP ok today;  Results in nurses note. She will talk with PCP on Friday about this bc it has happened several times. Dep and anxiety are much better with Spravato  and greatly relieved at the benefit for the first time in many years.  03/27/24 appt noted:  No psych meds bc not tolerated. Here for Spravato  84 mg today.  Expected dissociation well tolerated without NV, HA.  Some dizziness.  Good emotional relief from negativity immediately from Spravato .  Results in nurses note. BP mildly elevated at imes today Received clonidine 0.1 mg today and BP normalized. Mood overall  much better with Spravato .However she does have dips if she waits longer than a few days between Spravato  administration.  She feels the twice weekly is more effective at keeping her depression under control.  She has had a little increase in depression since  only receiving 1 Spravato  administration last week.  There are other factors such as work stress that are contributing to a sense of depression and worry.  She has discussed this with her other provider.  03/29/24 appt noted:  No psych meds except alprazolam  1 mg BID prn;  bc not tolerated. Here for Spravato  84 mg today.  Expected dissociation well tolerated without NV, HA.  Some dizziness.  Good emotional relief from negativity immediately from Spravato .  Results in nurses note. BP intermittently high. Plan: Disc Saffron dosing bc she is interested in this for depression off label. The Saffron dosage is 15 - 30 mg daily.  It ideally should be split between morning and evening but if needed can be taken once daily. Also rec probiotic with B. Longum an d she agrees to consider it, bc it is natural.  04/03/24 appt noted:  Meds:  alprazolam  1 mg BID prn; probiotic. Here for Spravato  84 mg today.  Expected dissociation well tolerated without NV,  HA.  Some dizziness.  Good emotional relief from negativity immediately from Spravato .  Results in nurses note. BP mildly elevated at imes today She reports having had 2 panic attacks recently since the last visit in the office.  These were stress related.  There is family stress occurring.  Also stress of the holidays. She reports that she believes her depression is related to dopamine.  She has low drive and low enjoyment of things that are traditionally enjoyable.  It is hard to fight the depression off. She is willing to take a med that increases dopamine as long as it is not a traditional antidepressant.  She reports traditional antidepressants make her depression worse and even give her suicidal thoughts. She has not started saffron yet but is considering it.  She is hesitant because of the cost.  Previous medications: Zoloft , fluoxetine , others,  Clonazepam    ECT-MADRS    Flowsheet Row Video Visit from 01/05/2024 in [name]  MADRS Total Score 31   GAD-7    Flowsheet Row Office Visit from 08/31/2022 in [facility] Office Visit from 03/03/2022 in [facility] Office Visit from 11/25/2021 in [facility] Office Visit from 08/12/2021 in [facility] Office Visit from 04/01/2021 in [facility]  Total GAD-7 Score 12 14 5 8 16    PHQ2-9    Flowsheet Row Video Visit from 01/05/2024 in [name] Office Visit from 08/31/2022 in [facility] Office Visit from 03/03/2022 in [facility] Office Visit from 02/10/2022 in [facility] Office Visit from 11/25/2021 in [facility]  PHQ-2 Total Score 4 4 6 5 2   PHQ-9 Total Score -- 17 14 13 8    Flowsheet Row ED from 05/26/2023 in [facility] Emergency Department at [facility] ED from 12/11/2022 in [facility] Emergency Department at [facility] ED from 05/05/2022 in [facility] Department at [facility]  C-SSRS RISK CATEGORY No Risk No Risk No Risk     Review of Systems:  Review of Systems  Cardiovascular:  Negative for chest pain and palpitations.  Gastrointestinal:  Negative for nausea and vomiting.  Neurological:  Negative for dizziness, weakness and headaches.  Psychiatric/Behavioral:  Positive for dysphoric mood. Negative for decreased concentration and sleep disturbance. The patient is nervous/anxious.     Medications: I have reviewed the patient's current medications.  Current Outpatient Medications  Medication Sig Dispense Refill   ALPRAZolam  (XANAX ) 1 MG tablet TAKE 1 TABLET BY MOUTH 2 TIMES DAILY AS NEEDED FOR ANXIETY. 60 tablet 0   atorvastatin  (LIPITOR) 80 MG tablet Take 1 tablet (80 mg total) by mouth daily. 90 tablet 1   metoprolol  tartrate (LOPRESSOR )  25 MG tablet TAKE 1 TABLET BY MOUTH TWICE A DAY 60 tablet 0   pramipexole  (MIRAPEX ) 0.25 MG tablet Take 1 tablet (0.25 mg total) by mouth 2 (two) times daily. 60 tablet 0   Blood Glucose Monitoring Suppl (ONETOUCH VERIO) w/Device KIT USE TO CHECK BLOOD SUGAR THREE TIMES DAILY. 1 kit 0   Cholecalciferol (VITAMIN D3) 50 MCG (2000 UT) capsule Take 1 capsule (2,000 Units total) by mouth daily. 90 capsule 1   clindamycin  (CLEOCIN ) 2 % vaginal cream Place 1 Applicatorful vaginally at bedtime. 40 g 0   Continuous Blood Gluc Receiver (DEXCOM G6 RECEIVER) DEVI 1 Bag by Does not apply  route in the morning, at noon, and at bedtime. 1 each 0   Continuous Glucose Sensor (DEXCOM G6 SENSOR) MISC USE AS DIRECTED EVERY 10 DAYS 9 each 2   Continuous Glucose Transmitter (DEXCOM G6 TRANSMITTER) MISC USE AS DIRECTED OVER 90 DAYS 1 each 2   Continuous Glucose Transmitter (DEXCOM G6 TRANSMITTER) MISC USE AS DIRECTED OVER 90 DAYS     cyclobenzaprine  (FLEXERIL ) 10 MG tablet Take 1 tablet (10 mg total) by mouth 2 (two) times daily as needed for muscle spasms. 12 tablet 0   Elastic Bandages & Supports (ELBOW COMPRESSION) MISC 1 Application by Does not apply route daily. 1 each 0   glucose blood (ONETOUCH VERIO) test strip USE TO CHECK BLOOD SUGAR THREE TIMES DAILY. 100 each 2   hydrochlorothiazide  (HYDRODIURIL ) 25 MG tablet Take 1 tablet (25 mg total) by mouth daily. 90 tablet 1   hydrOXYzine  (ATARAX ) 25 MG tablet Take 1 tablet (25 mg total) by mouth 3 (three) times daily as needed. 90 tablet 1   Insulin  Glargine (BASAGLAR  KWIKPEN) 100 UNIT/ML Inject 15 Units into the skin daily. 15 mL 0   insulin  lispro (HUMALOG  KWIKPEN) 100 UNIT/ML KwikPen INJECT up to 12u INTO THE SKIN 3 TIMES DAILY WITH MEALS per sliding scale: 200-250: 4 UNITS, 251-300: 6 UNITS, 301-350: 8 UNITS, 351-400: 10 UNITS,>400:12 UNITS AND CALL NP. Max daily dose: 36u 30 mL 0   Insulin  Pen Needle (PEN NEEDLES) 32G X 5 MM MISC Use to inject sliding scale Humalog   three times daily. 100 each 6   JARDIANCE  10 MG TABS tablet Take 10 mg by mouth daily.     lidocaine  (LIDODERM ) 5 % Place 1 patch onto the skin daily. Remove & Discard patch within 12 hours or as directed by MD 30 patch 0   LINZESS  145 MCG CAPS capsule Take 1 capsule (145 mcg total) by mouth daily as needed (constipation). 90 capsule 1   lisinopril  (ZESTRIL ) 20 MG tablet Take 1 tablet (20 mg total) by mouth daily. 90 tablet 1   metFORMIN (GLUCOPHAGE) 500 MG tablet Take 500 mg by mouth daily with breakfast.     metoCLOPramide  (REGLAN ) 10 MG tablet Take 10 mg by mouth every 6 (six) hours as needed. (Patient not taking: Reported on 04/03/2024)     norethindrone  (MICRONOR ) 0.35 MG tablet Take 1 tablet (0.35 mg total) by mouth daily. 28 tablet 0   omeprazole  (PRILOSEC) 20 MG capsule TAKE 1 CAPSULE BY MOUTH DAILY IF NEEDED FOR GASTRIC REFLUX 90 capsule 0   ondansetron  (ZOFRAN -ODT) 4 MG disintegrating tablet TAKE 1 TABLET BY MOUTH EVERY 8 HOURS AS NEEDED FOR NAUSEA AND VOMITING 12 tablet 0   OneTouch Delica Lancets 33G MISC USE TO CHECK BLOOD SUGAR THREE TIMES DAILY. 100 each 2   telmisartan (MICARDIS) 80 MG tablet Take 1 tablet by mouth daily.     No current facility-administered medications for this visit.    Medication Side Effects: None  Allergies: Not on File  Past Medical History:  Diagnosis Date   Anxiety    Diabetes mellitus without complication (HCC)    Gastroparesis    Hypertension    Major depressive disorder     Past Medical History, Surgical history, Social history, and Family history were reviewed and updated as appropriate.   Please see review of systems for further details on the patient's review from today.   Objective:   Physical Exam:  There were no vitals taken for this visit.  Physical Exam Constitutional:  General: She is not in acute distress. Musculoskeletal:        General: No deformity.  Neurological:     Mental Status: She is alert and oriented to  person, place, and time.     Coordination: Coordination normal.  Psychiatric:        Attention and Perception: Attention and perception normal. She does not perceive auditory or visual hallucinations.        Mood and Affect: Mood is anxious and depressed. Affect is not blunt, angry, tearful or inappropriate.        Speech: Speech normal.        Behavior: Behavior normal.        Thought Content: Thought content normal. Thought content is not paranoid or delusional. Thought content does not include homicidal or suicidal ideation. Thought content does not include suicidal plan.        Cognition and Memory: Cognition and memory normal.        Judgment: Judgment normal.     Comments: Insight intact Dep a bit worse lately with more stress at home.     Lab Review:     Component Value Date/Time   NA 131 (L) 05/25/2023 2132   NA 141 08/31/2022 0954   K 3.5 05/25/2023 2132   CL 94 (L) 05/25/2023 2132   CO2 25 05/25/2023 2132   GLUCOSE 314 (H) 05/25/2023 2132   BUN 10 05/25/2023 2132   BUN 12 08/31/2022 0954   CREATININE 0.87 05/25/2023 2132   CALCIUM  9.5 05/25/2023 2132   PROT 8.4 (H) 05/25/2023 2132   PROT 7.6 08/31/2022 0954   ALBUMIN 4.4 05/25/2023 2132   ALBUMIN 4.3 08/31/2022 0954   AST 71 (H) 05/25/2023 2132   ALT 86 (H) 05/25/2023 2132   ALKPHOS 70 05/25/2023 2132   BILITOT 1.0 05/25/2023 2132   BILITOT 0.3 08/31/2022 0954   GFRNONAA >60 05/25/2023 2132   GFRAA >60 10/10/2019 1441       Component Value Date/Time   WBC 11.8 (H) 05/25/2023 2132   RBC 4.51 05/25/2023 2132   HGB 15.1 (H) 05/25/2023 2132   HGB 13.9 08/31/2022 0954   HCT 42.4 05/25/2023 2132   HCT 41.0 08/31/2022 0954   PLT 187 05/25/2023 2132   PLT 249 08/31/2022 0954   MCV 94.0 05/25/2023 2132   MCV 96 08/31/2022 0954   MCH 33.5 05/25/2023 2132   MCHC 35.6 05/25/2023 2132   RDW 12.5 05/25/2023 2132   RDW 12.6 08/31/2022 0954   LYMPHSABS 0.5 (L) 05/25/2023 2132   LYMPHSABS 2.3 08/31/2022 0954    MONOABS 0.5 05/25/2023 2132   EOSABS 0.2 05/25/2023 2132   EOSABS 0.3 08/31/2022 0954   BASOSABS 0.0 05/25/2023 2132   BASOSABS 0.1 08/31/2022 0954    No results found for: POCLITH, LITHIUM   No results found for: PHENYTOIN, PHENOBARB, VALPROATE, CBMZ   .res Assessment: Plan:    Gwendolyn Carrillo was seen today for follow-up, depression, anxiety and stress.  Diagnoses and all orders for this visit:  Recurrent major depression resistant to treatment -     pramipexole  (MIRAPEX ) 0.25 MG tablet; Take 1 tablet (0.25 mg total) by mouth 2 (two) times daily.  Panic attacks  Generalized anxiety disorder  Mixed obsessional thoughts and acts  Insomnia, unspecified type  Primary hypertension    Disc her TRD and the SE Spravato .  She is very excited to be receiving this bc poor tolerance AD and no sig benefit with AD. Markedly better with Spravato   but  not 100%.    Patient was administered Spravato  84 mg intranasally today.  The patient experienced the typical dissociation which gradually resolved over the 2-hour period of observation.  There were no complications.  Specifically the patient did not have nausea or vomiting or headache.  Blood pressures remained within normal ranges at the 40-minute and 2-hour follow-up intervals.  By the time the 2-hour observation period was met the patient was alert and oriented and able to exit without assistance.  Patient feels the Spravato  administration is helpful for the treatment resistant depression and would like to continue the treatment.  See nursing note for further details.  BP issues.  She will discuss with PCP.  Reqwuires clonidine 0.1 mg prn in office and tolerated well.  Continue  84 mg Spravato  bc helps and well tolerated.      Consider alternatives to traditional AD given hx intolerance.  This might help reduce relapse risk.  Example probiotics, Saffron, low dose lithium.   Disc Saffron dosing bc she is interested in this for  depression off label. The Saffron dosage is 15 - 30 mg daily.  It ideally should be split between morning and evening but if needed can be taken once daily.  She has not taken yet DT cost Also rec probiotic with B. Longum an d she agreesand she is taking it bc it is natural.  She disc belief her depression is dopamine problem.  Disc at length that there is an off label alternative to traditional AD (which she reports make her dep and SI worse) which does increase DA, pramipexole .  Disc SE risks including N, dizzy etc.  but also rare risks hallucinations, impulsivity.   She is very motivated to try something that increases DA but wants to start very low.  Disc dosage range.  Start pramipexole  0.125 mg HS.  Counseling around other activities that could help dep including exercise, socialization, even routine of work.  Follow-up twice weekly for Spravato  administration  Lorene Macintosh MD, DFAPA  Please see After Visit Summary for patient specific instructions.  Future Appointments  Date Time Provider Department Center  04/05/2024  9:30 AM Cottle, Lorene KANDICE Raddle., MD [facility] None  04/05/2024  9:30 AM CP-NURSE [facility] None  04/10/2024  9:30 AM CP-NURSE [facility] None  04/10/2024  9:30 AM Mozingo, Angeline Mattocks, NP [facility] None  04/12/2024  9:30 AM CP-NURSE [facility] None  04/12/2024  9:30 AM Mozingo, Regina Nattalie, NP [facility] None  04/27/2024 11:30 AM Mozingo, Regina Nattalie, NP [facility] None  05/03/2024 12:00 PM Prentiss Riggs A, NP [facility] None    No orders of the defined types were placed in this encounter.   -------------------------------

## 2024-04-04 NOTE — Telephone Encounter (Signed)
 Please send

## 2024-04-05 ENCOUNTER — Telehealth: Admitting: Adult Health

## 2024-04-05 ENCOUNTER — Encounter

## 2024-04-05 ENCOUNTER — Encounter: Admitting: Psychiatry

## 2024-04-10 ENCOUNTER — Ambulatory Visit (INDEPENDENT_AMBULATORY_CARE_PROVIDER_SITE_OTHER): Admitting: Adult Health

## 2024-04-10 ENCOUNTER — Encounter: Payer: Self-pay | Admitting: Adult Health

## 2024-04-10 ENCOUNTER — Ambulatory Visit: Payer: Self-pay | Admitting: Podiatry

## 2024-04-10 ENCOUNTER — Ambulatory Visit

## 2024-04-10 VITALS — BP 123/80 | HR 88

## 2024-04-10 DIAGNOSIS — F339 Major depressive disorder, recurrent, unspecified: Secondary | ICD-10-CM

## 2024-04-10 NOTE — Progress Notes (Signed)
 Gwendolyn Carrillo [id number] 1975/05/25 48 y.o.  Subjective:   Patient ID:  Gwendolyn Carrillo is a 48 y.o. (DOB [birth year]) female.  Chief Complaint: No chief complaint on file.   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    Lamija was administered Spravato  84 mg intranasally today. Patient was observed by provider throughout Spravato  treatment. The patient experienced the typical dissociation which gradually resolved over the 2-hour period of observation. There were no complications. Specifically, the patient did not have any untoward side effects - feeling disconnected from themself, their thoughts, feelings and things around them, dizziness, nausea, feeling sleepy, decreased feeling of sensitivity (numbness) spinning sensation, feeling anxious, lack of energy, increased blood pressure, feeling happy or very excited, or headache. Blood pressures remained within normal ranges at the 40-minute and 2-hour follow-up intervals. By the time the 2-hour observation period was met the patient was alert and oriented and able to exit without assistance. Patient willing to continue Spravato  administration for the treatment of resistant depression.    Shauntee reports a positive experience today with Spravato  treatment. She describes her mood today as ok. Pleasant. Mood symptoms - reports decreased depressive symptoms. She reports increased anxiety. Denies irritability. She reports varying interest and motivation. She reports decreased rumination. Reports some worry and over thinking. Reports mood as variable, but feels like the Spravato  is helpful for depression. Taking current medications as prescribed. Reports her energy levels lower. Active, does not have a regular exercise routine. Reports she is able to enjoy some usual interests and activities. Appetite adequate. Weight stable. Reports sleeping well most nights. Reports focus and  concentration is variable. Completing tasks. Managing aspects of household. Denies SI or HI. Denies AH or VH.  Will plan on continuation of Spravato  at 84mg  at next treatment.  ECT-MADRS    Flowsheet Row Video Visit from 01/05/2024 in [name]  MADRS Total Score 31   GAD-7    Flowsheet Row Office Visit from 08/31/2022 in [facility] Office Visit from 03/03/2022 in [facility] Office Visit from 11/25/2021 in [facility] Office Visit from 08/12/2021 in [facility] Office Visit from 04/01/2021 in [facility]  Total GAD-7 Score 12 14 5 8 16    PHQ2-9    Flowsheet Row Video Visit from 01/05/2024 in [name] Office Visit from 08/31/2022 in [facility] Office Visit from 03/03/2022 in [facility] Office Visit from 02/10/2022 in [facility] Office Visit from 11/25/2021 in [facility]  PHQ-2 Total Score 4 4 6 5 2   PHQ-9 Total Score -- 17 14 13 8    Flowsheet Row ED from 05/26/2023 in [facility] Department at [facility] ED from 12/11/2022 in [facility] Emergency Department at [facility] ED from 05/05/2022 in [facility] Emergency Department at [facility]  C-SSRS RISK CATEGORY No Risk No Risk No Risk     Review of Systems:  Review of Systems  Musculoskeletal:  Negative for gait problem.  Neurological:  Negative for tremors.  Psychiatric/Behavioral:         Please refer to HPI    Medications: I have reviewed the patient's current medications.  Current Outpatient Medications  Medication Sig Dispense Refill   ALPRAZolam  (XANAX ) 1 MG tablet TAKE 1 TABLET BY MOUTH  TWICE A DAY AS NEEDED FOR ANXIETY 60 tablet 2   atorvastatin  (LIPITOR) 80 MG tablet Take 1 tablet (80  mg total) by mouth daily. 90 tablet 1   Blood Glucose Monitoring Suppl (ONETOUCH VERIO) w/Device KIT USE TO CHECK BLOOD SUGAR THREE TIMES DAILY. 1 kit 0   Cholecalciferol (VITAMIN D3) 50 MCG (2000 UT) capsule Take 1 capsule (2,000 Units total) by mouth daily. 90 capsule 1   clindamycin  (CLEOCIN ) 2 % vaginal cream Place 1 Applicatorful vaginally at bedtime. 40 g 0   Continuous Blood Gluc Receiver (DEXCOM G6 RECEIVER) DEVI 1 Bag by Does not apply route in the morning, at noon, and at bedtime. 1 each 0   Continuous Glucose Sensor (DEXCOM G6 SENSOR) MISC USE AS DIRECTED EVERY 10 DAYS 9 each 2   Continuous Glucose Transmitter (DEXCOM G6 TRANSMITTER) MISC USE AS DIRECTED OVER 90 DAYS 1 each 2   Continuous Glucose Transmitter (DEXCOM G6 TRANSMITTER) MISC USE AS DIRECTED OVER 90 DAYS     cyclobenzaprine  (FLEXERIL ) 10 MG tablet Take 1 tablet (10 mg total) by mouth 2 (two) times daily as needed for muscle spasms. 12 tablet 0   Elastic Bandages & Supports (ELBOW COMPRESSION) MISC 1 Application by Does not apply route daily. 1 each 0   glucose blood (ONETOUCH VERIO) test strip USE TO CHECK BLOOD SUGAR THREE TIMES DAILY. 100 each 2   hydrochlorothiazide  (HYDRODIURIL ) 25 MG tablet Take 1 tablet (25 mg total) by mouth daily. 90 tablet 1   hydrOXYzine  (ATARAX ) 25 MG tablet Take 1 tablet (25 mg total) by mouth 3 (three) times daily as needed. 90 tablet 1   Insulin  Glargine (BASAGLAR  KWIKPEN) 100 UNIT/ML Inject 15 Units into the skin daily. 15 mL 0   insulin  lispro (HUMALOG  KWIKPEN) 100 UNIT/ML KwikPen INJECT up to 12u INTO THE SKIN 3 TIMES DAILY WITH MEALS per sliding scale: 200-250: 4 UNITS, 251-300: 6 UNITS, 301-350: 8 UNITS, 351-400: 10 UNITS,>400:12 UNITS AND CALL NP. Max daily dose: 36u 30 mL 0   Insulin  Pen Needle (PEN NEEDLES) 32G X 5 MM MISC Use to inject sliding scale Humalog  three times daily. 100 each 6   JARDIANCE  10 MG TABS tablet Take 10 mg by mouth daily.     lidocaine  (LIDODERM ) 5 % Place 1 patch  onto the skin daily. Remove & Discard patch within 12 hours or as directed by MD 30 patch 0   LINZESS  145 MCG CAPS capsule Take 1 capsule (145 mcg total) by mouth daily as needed (constipation). 90 capsule 1   lisinopril  (ZESTRIL ) 20 MG tablet Take 1 tablet (20 mg total) by mouth daily. 90 tablet 1   metFORMIN (GLUCOPHAGE) 500 MG tablet Take 500 mg by mouth daily with breakfast.     metoCLOPramide  (REGLAN ) 10 MG tablet Take 10 mg by mouth every 6 (six) hours as needed. (Patient not taking: Reported on 04/03/2024)     metoprolol  tartrate (LOPRESSOR ) 25 MG tablet TAKE 1 TABLET BY MOUTH TWICE A DAY 60 tablet 0   norethindrone  (MICRONOR ) 0.35 MG tablet Take 1 tablet (0.35 mg total) by mouth daily. 28 tablet 0   omeprazole  (PRILOSEC) 20 MG capsule TAKE 1 CAPSULE BY MOUTH DAILY IF NEEDED FOR GASTRIC REFLUX 90 capsule 0   ondansetron  (ZOFRAN -ODT) 4 MG disintegrating tablet TAKE 1 TABLET BY MOUTH EVERY 8 HOURS AS NEEDED FOR NAUSEA AND VOMITING 12 tablet 0   OneTouch Delica Lancets 33G MISC USE TO CHECK BLOOD SUGAR THREE TIMES DAILY. 100 each 2  pramipexole  (MIRAPEX ) 0.25 MG tablet Take 1 tablet (0.25 mg total) by mouth 2 (two) times daily. 60 tablet 0   telmisartan (MICARDIS) 80 MG tablet Take 1 tablet by mouth daily.     No current facility-administered medications for this visit.    Medication Side Effects: None  Allergies: Allergies[1]  Past Medical History:  Diagnosis Date   Anxiety    Diabetes mellitus without complication (HCC)    Gastroparesis    Hypertension    Major depressive disorder     Past Medical History, Surgical history, Social history, and Family history were reviewed and updated as appropriate.   Please see review of systems for further details on the patient's review from today.   Objective:   Physical Exam:  There were no vitals taken for this visit.  Physical Exam Constitutional:      General: She is not in acute distress. Musculoskeletal:        General: No  deformity.  Neurological:     Mental Status: She is alert and oriented to person, place, and time.     Coordination: Coordination normal.  Psychiatric:        Attention and Perception: Attention and perception normal. She does not perceive auditory or visual hallucinations.        Mood and Affect: Mood normal. Mood is not anxious or depressed. Affect is not labile, blunt, angry or inappropriate.        Speech: Speech normal.        Behavior: Behavior normal.        Thought Content: Thought content normal. Thought content is not paranoid or delusional. Thought content does not include homicidal or suicidal ideation. Thought content does not include homicidal or suicidal plan.        Cognition and Memory: Cognition and memory normal.        Judgment: Judgment normal.     Comments: Insight intact     Lab Review:     Component Value Date/Time   NA 131 (L) 05/25/2023 2132   NA 141 08/31/2022 0954   K 3.5 05/25/2023 2132   CL 94 (L) 05/25/2023 2132   CO2 25 05/25/2023 2132   GLUCOSE 314 (H) 05/25/2023 2132   BUN 10 05/25/2023 2132   BUN 12 08/31/2022 0954   CREATININE 0.87 05/25/2023 2132   CALCIUM  9.5 05/25/2023 2132   PROT 8.4 (H) 05/25/2023 2132   PROT 7.6 08/31/2022 0954   ALBUMIN 4.4 05/25/2023 2132   ALBUMIN 4.3 08/31/2022 0954   AST 71 (H) 05/25/2023 2132   ALT 86 (H) 05/25/2023 2132   ALKPHOS 70 05/25/2023 2132   BILITOT 1.0 05/25/2023 2132   BILITOT 0.3 08/31/2022 0954   GFRNONAA >60 05/25/2023 2132   GFRAA >60 10/10/2019 1441       Component Value Date/Time   WBC 11.8 (H) 05/25/2023 2132   RBC 4.51 05/25/2023 2132   HGB 15.1 (H) 05/25/2023 2132   HGB 13.9 08/31/2022 0954   HCT 42.4 05/25/2023 2132   HCT 41.0 08/31/2022 0954   PLT 187 05/25/2023 2132   PLT 249 08/31/2022 0954   MCV 94.0 05/25/2023 2132   MCV 96 08/31/2022 0954   MCH 33.5 05/25/2023 2132   MCHC 35.6 05/25/2023 2132   RDW 12.5 05/25/2023 2132   RDW 12.6 08/31/2022 0954   LYMPHSABS 0.5 (L)  05/25/2023 2132   LYMPHSABS 2.3 08/31/2022 0954   MONOABS 0.5 05/25/2023 2132   EOSABS 0.2 05/25/2023 2132   EOSABS 0.3 08/31/2022  0954   BASOSABS 0.0 05/25/2023 2132   BASOSABS 0.1 08/31/2022 0954    No results found for: POCLITH, LITHIUM   No results found for: PHENYTOIN, PHENOBARB, VALPROATE, CBMZ   .res Assessment: Plan:    RECOMMENDATIONS:     Will continue current medication regimen.   Patient had no questions or concerns today. Reports session went well. She was alert and oriented before leaving the office. I provided 40 minutes of face to face time during this encounter, including time spent before and after the visit in records review, medical decision making, counseling pertinent to today's visit and charting. Patient reports the Spravato  treatment is helpful.    Please see After Visit Summary for patient specific instructions.  Future Appointments  Date Time Provider Department Center  04/10/2024  4:00 PM Silva Juliene SAUNDERS, DPM [facility]  04/12/2024  9:30 AM CP-NURSE [facility] None  04/12/2024  9:30 AM Arlys Scatena Nattalie, NP [facility] None  04/27/2024 11:30 AM Shanetta Nicolls Nattalie, NP [facility] None  05/03/2024 12:00 PM Prentiss Riggs A, NP [facility] None    No orders of the defined types were placed in this encounter.   -------------------------------     [1] Not on File

## 2024-04-10 NOTE — Progress Notes (Signed)
 NURSES NOTE:         Pt arrived for her 19th Spravato  Treatment for treatment resistant depression, pt will be getting 84 mg (3 of the 28 mg inhalers), for continued maintenance dose. Pt sees [employer] and she will follow up with her for office visits and see her at treatment when she is in the office. Explained to pt how the treatments would be scheduled and answered any questions she had today. Pt's Spravato  is billed through buy and bill medically.  Spravato  medication is stored at treatment center per REMS/FDA guidelines. The medication is required to be locked behind two doors per REMS/FDA protocol. Medication is also disposed of properly after each use per regulations. All documentation for REMS is completed and submitted per FDA/REMS requirements.          Began taking patient's vital signs at 9:05 AM 130/84, pulse 86, SpO2 Sat. 99%. Vital signs appropriate to start treatment. Instructed patient to blow her nose if needed then recline back to a 45 degree angle. Gave patient first dose 28 mg nasal spray, administered in each nostril as directed and observed by nurse, waited 5 more minutes for the second and third doses. After all doses given pt did not complain of any nausea/vomiting, pt did have a drink to help with the taste of Spravato  it gives the metallic taste. Assessed her 40 minute vitals, 9:50 AM, 126/84, pulse 88, SpO2 99%.  Explained she would be monitored for a total time of 120 minutes after beginning treatment. Discharge vitals were taken at 11:11 AM 123/80, P 88, SpO2 99%. Angeline met with pt once her thoughts were clearer to discuss how treatment went. She has decided to decrease treatments to once per week starting this week. Recommend she go home and sleep or just relax on the couch. No driving, no intense activities. Verbalized understanding. Pt will return next Tuesday, week of Christmas. Nurse was with pt a total of 60 minutes for clinical assessment.  Pt will get 84  mg.   (84 mg)  LOT [id number] EXP [id number]

## 2024-04-12 ENCOUNTER — Encounter: Admitting: Adult Health

## 2024-04-12 ENCOUNTER — Encounter

## 2024-04-17 ENCOUNTER — Ambulatory Visit

## 2024-04-17 ENCOUNTER — Encounter: Admitting: Psychiatry

## 2024-04-17 VITALS — BP 140/83 | HR 86

## 2024-04-17 DIAGNOSIS — F422 Mixed obsessional thoughts and acts: Secondary | ICD-10-CM

## 2024-04-17 DIAGNOSIS — G4700 Insomnia, unspecified: Secondary | ICD-10-CM

## 2024-04-17 DIAGNOSIS — F410 Panic disorder [episodic paroxysmal anxiety] without agoraphobia: Secondary | ICD-10-CM

## 2024-04-17 DIAGNOSIS — F339 Major depressive disorder, recurrent, unspecified: Secondary | ICD-10-CM

## 2024-04-17 DIAGNOSIS — F411 Generalized anxiety disorder: Secondary | ICD-10-CM

## 2024-04-17 DIAGNOSIS — I10 Essential (primary) hypertension: Secondary | ICD-10-CM

## 2024-04-17 NOTE — Progress Notes (Signed)
 NURSES NOTE:         Pt arrived for her 20th Spravato  Treatment for treatment resistant depression, pt will be getting 84 mg (3 of the 28 mg inhalers), for continued maintenance dose. Pt sees [employer] and she will follow up with her for office visits and see her at treatment when she is in the office. Explained to pt how the treatments would be scheduled and answered any questions she had today. Pt's Spravato  is billed through buy and bill medically.  Spravato  medication is stored at treatment center per REMS/FDA guidelines. The medication is required to be locked behind two doors per REMS/FDA protocol. Medication is also disposed of properly after each use per regulations. All documentation for REMS is completed and submitted per FDA/REMS requirements.          Began taking patient's vital signs at 9:40 AM 149/92, pulse 78, SpO2 Sat. 97%. Vital signs appropriate to start treatment. Instructed patient to blow her nose if needed then recline back to a 45 degree angle. Gave patient first dose 28 mg nasal spray, administered in each nostril as directed and observed by nurse, waited 5 more minutes for the second and third doses. After all doses given pt did not complain of any nausea/vomiting, pt did have a drink to help with the taste of Spravato  it gives the metallic taste. Assessed her 40 minute vitals, 10:30 AM, 155/94, pulse 77, SpO2 97%.  Explained she would be monitored for a total time of 120 minutes after beginning treatment. Discharge vitals were taken at 11:50 AM 140/83, P 86, SpO2 98%. Dr. Geoffry met with pt once her thoughts were clearer to discuss how treatment went. Recommend she go home and sleep or just relax on the couch. No driving, no intense activities. Verbalized understanding. Pt will return next Tuesday, week of New Years. Nurse was with pt a total of 60 minutes for clinical assessment.  Pt will get 84 mg.   (84 mg)  LOT [id number] EXP [id number]

## 2024-04-20 ENCOUNTER — Other Ambulatory Visit: Payer: Self-pay | Admitting: Cardiology

## 2024-04-20 DIAGNOSIS — R002 Palpitations: Secondary | ICD-10-CM

## 2024-04-23 ENCOUNTER — Encounter: Payer: Self-pay | Admitting: Psychiatry

## 2024-04-23 NOTE — Progress Notes (Signed)
 Gwendolyn Carrillo [id number] [birth year] 48 y.o.  Subjective:   Patient ID:  Gwendolyn Carrillo is a 48 y.o. (DOB [birth year]) female.  Chief Complaint:  Chief Complaint  Patient presents with   Follow-up   Depression   Anxiety   Stress   Sleeping Problem    HPI Gwendolyn Carrillo presents to the office today for follow-up of treatment resistant major depression.  She also has a history of generalized anxiety disorder, OCD, and panic.  She describes a long history of depression and anxiety and has been unable to tolerate most antidepressants.  She is very eager to pursue Spravato  for relief from her depression.    01/31/24 appt noted:  Her PHQ-9 score was 22 today.  She describes symptoms of anhedonia, low motivation, difficulty concentrating, poor productivity, difficulty being around people and wanting to isolate.  She is not suicidal but she has had hopelessness other than this opportunity to pursue Spravato .  She does not feel like she can function at work and is discussing leave of absence with her usual provider [employer], PNP. She was so excited to start treatment today that she had trouble sleeping last night. She received her first administration of Spravato  at 56 mg today.  She had no untoward side effects such as nausea dizziness headache or vomiting.  She did have the expected dissociation but it was not scary.  In fact she experienced a relief from negative emotions such as depression and anxiety while under treatment. She was very drowsy and fell asleep.  She explained it as being due to being sleep deprived.  She is eager to continue the Spravato .  02/02/24 appt noted:  She received her second administration of Spravato  at 56 mg today.  She had no untoward side effects such as nausea dizziness headache or vomiting.  She did have the expected dissociation but it was not scary.  In fact she experienced a relief from negative emotions such as depression and anxiety while under  treatment. She was much less drowsy with it.  Would like to increase dose as planned.  No SI.  Tolerating current tx plan.   02/14/24 appt noted: 2 Spravato  84 mg tx last week as planned Here for Spravato  84 mg today.  Expected dissociation well tolerated without NV, HA.  Some dizziness.  Good emotional relief from negativity immediately from Spravato .  Med: no fluoxetine  bc not tolerated Mood sig better by most in decades with Spravato .  Dep is much better.  Anxiety is better too.  Sleep is ok.  Wants to continue tx.  02/16/24 appt noted:  No psych meds bc not tolerated. Here for Spravato  84 mg today.  Expected dissociation well tolerated without NV, HA.  Some dizziness.  Good emotional relief from negativity immediately from Spravato .  Mood so far dramatically better and anxiety better with Spravato .  No new problems.  Wants to continue Spravato .  02/21/24 appt noted:  No psych meds bc not tolerated. Here for Spravato  84 mg today.  Expected dissociation well tolerated without NV, HA.  Some dizziness.  Good emotional relief from negativity immediately from Spravato .  Dep has gone into remission Spravato  and anxiety is much better .  She is very happy with the tx plan.  No new concerns.    02/28/24 appt noted:  No psych meds bc not tolerated. Here for Spravato  84 mg today.  Expected dissociation well tolerated without NV, HA.  Some dizziness.  Good emotional relief from negativity immediately from  Spravato .  Dep unexpectedly worse Saturday and since.  Having experienced relief she doesn't want to go backwards. There are some stressors contributing to it but she wants to continue Spravato  twice weekly until she gets better.  03/01/24 appt noted: No psych meds bc not tolerated. Here for Spravato  84 mg today.  Expected dissociation well tolerated without NV, HA.  Some dizziness.  Good emotional relief from negativity immediately from Spravato .  Felt need bc relapse dep to increase Spravato  back  to twice weekly.   Feels better about getting second spravato  this week.  She believes it will help get her out of the dep like she was before.  Wants to avoid psych med bc hx intolerance.  BP was high this am and required clonidine 0.1 mg which resolved BP px.  She will discuss with PDP later this month.  03/06/24 appt noted: No psych meds bc not tolerated. Here for Spravato  84 mg today.  Expected dissociation well tolerated without NV, HA.  Some dizziness.  Good emotional relief from negativity immediately from Spravato .  Had hypertension this am prior ot Spravato  and was treated with clonidine 0.1 mg which worked.  Results in nurses note. She will talk with PCP on Friday about this bc it has happened several times. Felt need bc relapse dep to increase Spravato  back to twice weekly.  But dep is improving again.  Feels satisfied with tx and wants to continue current plan.  03/08/24 appt noted: No psych meds bc not tolerated. Here for Spravato  84 mg today.  Expected dissociation well tolerated without NV, HA.  Some dizziness.  Good emotional relief from negativity immediately from Spravato .  BP ok today;  Results in nurses note. She will talk with PCP on Friday about this bc it has happened several times. Dep and anxiety are much better with Spravato  and greatly relieved at the benefit for the first time in many years.  03/27/24 appt noted:  No psych meds bc not tolerated. Here for Spravato  84 mg today.  Expected dissociation well tolerated without NV, HA.  Some dizziness.  Good emotional relief from negativity immediately from Spravato .  Results in nurses note. BP mildly elevated at imes today Received clonidine 0.1 mg today and BP normalized. Mood overall  much better with Spravato .However she does have dips if she waits longer than a few days between Spravato  administration.  She feels the twice weekly is more effective at keeping her depression under control.  She has had a little increase in  depression since only receiving 1 Spravato  administration last week.  There are other factors such as work stress that are contributing to a sense of depression and worry.  She has discussed this with her other provider.  03/29/24 appt noted:  No psych meds except alprazolam  1 mg BID prn;  bc not tolerated. Here for Spravato  84 mg today.  Expected dissociation well tolerated without NV, HA.  Some dizziness.  Good emotional relief from negativity immediately from Spravato .  Results in nurses note. BP intermittently high. Plan: Disc Saffron dosing bc she is interested in this for depression off label. The Saffron dosage is 15 - 30 mg daily.  It ideally should be split between morning and evening but if needed can be taken once daily. Also rec probiotic with B. Longum an d she agrees to consider it, bc it is natural.  04/03/24 appt noted:  Meds:  alprazolam  1 mg BID prn; probiotic. Here for Spravato  84 mg today.  Expected dissociation  well tolerated without NV, HA.  Some dizziness.  Good emotional relief from negativity immediately from Spravato .  Results in nurses note. BP mildly elevated at imes today She reports having had 2 panic attacks recently since the last visit in the office.  These were stress related.  There is family stress occurring.  Also stress of the holidays. She reports that she believes her depression is related to dopamine.  She has low drive and low enjoyment of things that are traditionally enjoyable.  It is hard to fight the depression off. She is willing to take a med that increases dopamine as long as it is not a traditional antidepressant.  She reports traditional antidepressants make her depression worse and even give her suicidal thoughts. She has not started saffron yet but is considering it.  She is hesitant because of the cost.  04/17/24 appt noted:  Meds:  alprazolam  1 mg BID prn; probiotic. Here for Spravato  84 mg today.  Expected dissociation well tolerated  without NV, HA.  Some dizziness.  Good emotional relief from negativity immediately from Spravato .  Results in nurses note. BP mildly elevated at imes today She reports having had 2 panic attacks recently since the last visit in the office.  These were stress related.  There is family stress occurring.  Also stress of the holidays. She feels like pramipexole  is helping some and willing to try it. Spravato  helping.  Previous medications: Zoloft , fluoxetine , others,  Clonazepam    ECT-MADRS    Flowsheet Row Video Visit from 01/05/2024 in [name]  MADRS Total Score 31   GAD-7    Flowsheet Row Office Visit from 08/31/2022 in [facility] Office Visit from 03/03/2022 in [facility] Office Visit from 11/25/2021 in [facility] Office Visit from 08/12/2021 in [facility] Office Visit from 04/01/2021 in [facility]  Total GAD-7 Score 12 14 5 8 16    PHQ2-9    Flowsheet Row Video Visit from 01/05/2024 in [name] Office Visit from 08/31/2022 in [facility] Office Visit from 03/03/2022 in [facility] Office Visit from 02/10/2022 in [facility] Office Visit from 11/25/2021 in [facility]  PHQ-2 Total Score 4 4 6 5 2   PHQ-9 Total Score -- 17 14 13 8    Flowsheet Row ED from 05/26/2023 in [facility] Department at [facility] ED from 12/11/2022 in [facility] Emergency Department at [facility] ED from 05/05/2022 in [facility] Emergency Department at [facility]  C-SSRS RISK CATEGORY No Risk No Risk No Risk     Review of Systems:  Review of Systems  Cardiovascular:  Negative for chest pain and palpitations.  Gastrointestinal:  Negative for nausea and vomiting.   Neurological:  Negative for dizziness and weakness.  Psychiatric/Behavioral:  Positive for dysphoric mood. Negative for decreased concentration and sleep disturbance. The patient is nervous/anxious.     Medications: I have reviewed the patient's current medications.  Current Outpatient Medications  Medication Sig Dispense Refill   ALPRAZolam  (XANAX ) 1 MG tablet TAKE 1 TABLET BY MOUTH TWICE A DAY AS NEEDED FOR ANXIETY 60 tablet 2   atorvastatin  (LIPITOR) 80 MG tablet Take 1 tablet (80 mg total) by mouth daily. 90 tablet 1   Blood Glucose Monitoring Suppl (ONETOUCH VERIO) w/Device KIT USE TO CHECK BLOOD SUGAR THREE TIMES DAILY.  1 kit 0   Cholecalciferol (VITAMIN D3) 50 MCG (2000 UT) capsule Take 1 capsule (2,000 Units total) by mouth daily. 90 capsule 1   clindamycin  (CLEOCIN ) 2 % vaginal cream Place 1 Applicatorful vaginally at bedtime. 40 g 0   Continuous Blood Gluc Receiver (DEXCOM G6 RECEIVER) DEVI 1 Bag by Does not apply route in the morning, at noon, and at bedtime. 1 each 0   Continuous Glucose Sensor (DEXCOM G6 SENSOR) MISC USE AS DIRECTED EVERY 10 DAYS 9 each 2   Continuous Glucose Transmitter (DEXCOM G6 TRANSMITTER) MISC USE AS DIRECTED OVER 90 DAYS 1 each 2   Continuous Glucose Transmitter (DEXCOM G6 TRANSMITTER) MISC USE AS DIRECTED OVER 90 DAYS     cyclobenzaprine  (FLEXERIL ) 10 MG tablet Take 1 tablet (10 mg total) by mouth 2 (two) times daily as needed for muscle spasms. 12 tablet 0   Elastic Bandages & Supports (ELBOW COMPRESSION) MISC 1 Application by Does not apply route daily. 1 each 0   glucose blood (ONETOUCH VERIO) test strip USE TO CHECK BLOOD SUGAR THREE TIMES DAILY. 100 each 2   hydrochlorothiazide  (HYDRODIURIL ) 25 MG tablet Take 1 tablet (25 mg total) by mouth daily. 90 tablet 1   hydrOXYzine  (ATARAX ) 25 MG tablet Take 1 tablet (25 mg total) by mouth 3 (three) times daily as needed. 90 tablet 1   Insulin  Glargine (BASAGLAR  KWIKPEN) 100 UNIT/ML Inject 15 Units into the  skin daily. 15 mL 0   insulin  lispro (HUMALOG  KWIKPEN) 100 UNIT/ML KwikPen INJECT up to 12u INTO THE SKIN 3 TIMES DAILY WITH MEALS per sliding scale: 200-250: 4 UNITS, 251-300: 6 UNITS, 301-350: 8 UNITS, 351-400: 10 UNITS,>400:12 UNITS AND CALL NP. Max daily dose: 36u 30 mL 0   Insulin  Pen Needle (PEN NEEDLES) 32G X 5 MM MISC Use to inject sliding scale Humalog  three times daily. 100 each 6   JARDIANCE  10 MG TABS tablet Take 10 mg by mouth daily.     lidocaine  (LIDODERM ) 5 % Place 1 patch onto the skin daily. Remove & Discard patch within 12 hours or as directed by MD 30 patch 0   LINZESS  145 MCG CAPS capsule Take 1 capsule (145 mcg total) by mouth daily as needed (constipation). 90 capsule 1   lisinopril  (ZESTRIL ) 20 MG tablet Take 1 tablet (20 mg total) by mouth daily. 90 tablet 1   metFORMIN (GLUCOPHAGE) 500 MG tablet Take 500 mg by mouth daily with breakfast.     metoCLOPramide  (REGLAN ) 10 MG tablet Take 10 mg by mouth every 6 (six) hours as needed.     metoprolol  tartrate (LOPRESSOR ) 25 MG tablet TAKE 1 TABLET BY MOUTH TWICE A DAY 30 tablet 0   norethindrone  (MICRONOR ) 0.35 MG tablet Take 1 tablet (0.35 mg total) by mouth daily. 28 tablet 0   omeprazole  (PRILOSEC) 20 MG capsule TAKE 1 CAPSULE BY MOUTH DAILY IF NEEDED FOR GASTRIC REFLUX 90 capsule 0   ondansetron  (ZOFRAN -ODT) 4 MG disintegrating tablet TAKE 1 TABLET BY MOUTH EVERY 8 HOURS AS NEEDED FOR NAUSEA AND VOMITING 12 tablet 0   OneTouch Delica Lancets 33G MISC USE TO CHECK BLOOD SUGAR THREE TIMES DAILY. 100 each 2   pramipexole  (MIRAPEX ) 0.25 MG tablet Take 1 tablet (0.25 mg total) by mouth 2 (two) times daily. 60 tablet 0   telmisartan (MICARDIS) 80 MG tablet Take 1 tablet by mouth daily.     No current facility-administered medications for this visit.    Medication Side Effects: None  Allergies:  Not on File  Past Medical History:  Diagnosis Date   Anxiety    Diabetes mellitus without complication (HCC)    Gastroparesis     Hypertension    Major depressive disorder     Past Medical History, Surgical history, Social history, and Family history were reviewed and updated as appropriate.   Please see review of systems for further details on the patient's review from today.   Objective:   Physical Exam:  There were no vitals taken for this visit.  Physical Exam Constitutional:      General: She is not in acute distress. Musculoskeletal:        General: No deformity.  Neurological:     Mental Status: She is alert and oriented to person, place, and time.     Coordination: Coordination normal.  Psychiatric:        Attention and Perception: Attention and perception normal. She does not perceive auditory or visual hallucinations.        Mood and Affect: Mood is anxious and depressed. Affect is not blunt, angry, tearful or inappropriate.        Speech: Speech normal.        Behavior: Behavior normal.        Thought Content: Thought content normal. Thought content is not paranoid or delusional. Thought content does not include homicidal or suicidal ideation. Thought content does not include suicidal plan.        Cognition and Memory: Cognition and memory normal.        Judgment: Judgment normal.     Comments: Insight intact Dep off and on with stress. Benefit Spravato .     Lab Review:     Component Value Date/Time   NA 131 (L) 05/25/2023 2132   NA 141 08/31/2022 0954   K 3.5 05/25/2023 2132   CL 94 (L) 05/25/2023 2132   CO2 25 05/25/2023 2132   GLUCOSE 314 (H) 05/25/2023 2132   BUN 10 05/25/2023 2132   BUN 12 08/31/2022 0954   CREATININE 0.87 05/25/2023 2132   CALCIUM  9.5 05/25/2023 2132   PROT 8.4 (H) 05/25/2023 2132   PROT 7.6 08/31/2022 0954   ALBUMIN 4.4 05/25/2023 2132   ALBUMIN 4.3 08/31/2022 0954   AST 71 (H) 05/25/2023 2132   ALT 86 (H) 05/25/2023 2132   ALKPHOS 70 05/25/2023 2132   BILITOT 1.0 05/25/2023 2132   BILITOT 0.3 08/31/2022 0954   GFRNONAA >60 05/25/2023 2132   GFRAA >60  10/10/2019 1441       Component Value Date/Time   WBC 11.8 (H) 05/25/2023 2132   RBC 4.51 05/25/2023 2132   HGB 15.1 (H) 05/25/2023 2132   HGB 13.9 08/31/2022 0954   HCT 42.4 05/25/2023 2132   HCT 41.0 08/31/2022 0954   PLT 187 05/25/2023 2132   PLT 249 08/31/2022 0954   MCV 94.0 05/25/2023 2132   MCV 96 08/31/2022 0954   MCH 33.5 05/25/2023 2132   MCHC 35.6 05/25/2023 2132   RDW 12.5 05/25/2023 2132   RDW 12.6 08/31/2022 0954   LYMPHSABS 0.5 (L) 05/25/2023 2132   LYMPHSABS 2.3 08/31/2022 0954   MONOABS 0.5 05/25/2023 2132   EOSABS 0.2 05/25/2023 2132   EOSABS 0.3 08/31/2022 0954   BASOSABS 0.0 05/25/2023 2132   BASOSABS 0.1 08/31/2022 0954    No results found for: POCLITH, LITHIUM   No results found for: PHENYTOIN, PHENOBARB, VALPROATE, CBMZ   .res Assessment: Plan:    Gwendolyn Carrillo was seen today for follow-up, depression, anxiety,  stress and sleeping problem.  Diagnoses and all orders for this visit:  Recurrent major depression resistant to treatment  Generalized anxiety disorder  Panic attacks  Mixed obsessional thoughts and acts  Insomnia, unspecified type  Primary hypertension    Disc her TRD and the SE Spravato .  She is very excited to be receiving this bc poor tolerance AD and no sig benefit with AD. Markedly better with Spravato   but not 100%.    Patient was administered Spravato  84 mg intranasally today.  The patient experienced the typical dissociation which gradually resolved over the 2-hour period of observation.  There were no complications.  Specifically the patient did not have nausea or vomiting or headache.  Blood pressures remained within normal ranges at the 40-minute and 2-hour follow-up intervals.  By the time the 2-hour observation period was met the patient was alert and oriented and able to exit without assistance.  Patient feels the Spravato  administration is helpful for the treatment resistant depression and would like to  continue the treatment.  See nursing note for further details.  BP issues.  She will discuss with PCP.  Reqwuires clonidine 0.1 mg prn in office and tolerated well.  Continue  84 mg Spravato  bc helps and well tolerated.      Consider alternatives to traditional AD given hx intolerance.  This might help reduce relapse risk.  Example probiotics, Saffron, low dose lithium.   Disc Saffron dosing bc she is interested in this for depression off label. The Saffron dosage is 15 - 30 mg daily.  It ideally should be split between morning and evening but if needed can be taken once daily.  She has not taken yet DT cost Also rec probiotic with B. Longum an d she agreesand she is taking it bc it is natural.  She disc belief her depression is dopamine problem.  Disc at length that there is an off label alternative to traditional AD (which she reports make her dep and SI worse) which does increase DA, pramipexole .  Disc SE risks including N, dizzy etc.  but also rare risks hallucinations, impulsivity.   She is very motivated to try something that increases DA but wants to start very low.  Disc dosage range.  Start pramipexole  0.125 mg HS.  Encourage increase as tolerated.  Disc SE  Counseling around other activities that could help dep including exercise, socialization, even routine of work.  Follow-up twice weekly for Spravato  administration  Lorene Macintosh MD, DFAPA  Please see After Visit Summary for patient specific instructions.  Future Appointments  Date Time Provider Department Center  04/24/2024  9:30 AM Cottle, Lorene KANDICE Raddle., MD [facility] None  04/24/2024  9:30 AM CP-NURSE [facility] None  04/27/2024 11:30 AM Mozingo, Angeline Mattocks, NP [facility] None  05/03/2024 12:00 PM Prentiss Riggs A, NP [facility] None    No orders of the defined types were placed in this encounter.   -------------------------------

## 2024-04-24 ENCOUNTER — Ambulatory Visit

## 2024-04-24 ENCOUNTER — Ambulatory Visit (INDEPENDENT_AMBULATORY_CARE_PROVIDER_SITE_OTHER): Admitting: Psychiatry

## 2024-04-24 ENCOUNTER — Encounter: Payer: Self-pay | Admitting: Psychiatry

## 2024-04-24 VITALS — BP 136/84 | HR 85

## 2024-04-24 DIAGNOSIS — I10 Essential (primary) hypertension: Secondary | ICD-10-CM

## 2024-04-24 DIAGNOSIS — F339 Major depressive disorder, recurrent, unspecified: Secondary | ICD-10-CM | POA: Diagnosis not present

## 2024-04-24 DIAGNOSIS — F410 Panic disorder [episodic paroxysmal anxiety] without agoraphobia: Secondary | ICD-10-CM

## 2024-04-24 DIAGNOSIS — F422 Mixed obsessional thoughts and acts: Secondary | ICD-10-CM

## 2024-04-24 DIAGNOSIS — F411 Generalized anxiety disorder: Secondary | ICD-10-CM

## 2024-04-24 DIAGNOSIS — G4700 Insomnia, unspecified: Secondary | ICD-10-CM

## 2024-04-24 NOTE — Progress Notes (Signed)
 Gwendolyn Carrillo [id number] 10-Aug-1975 48 y.o.  Subjective:   Patient ID:  Gwendolyn Carrillo is a 48 y.o. (DOB [birth year]) female.  Chief Complaint:  Chief Complaint  Patient presents with   Follow-up   Depression   Anxiety   Medication Reaction    HPI Gwendolyn Carrillo presents to the office today for follow-up of treatment resistant major depression.  She also has a history of generalized anxiety disorder, OCD, and panic.  She describes a long history of depression and anxiety and has been unable to tolerate most antidepressants.  She is very eager to pursue Spravato  for relief from her depression.    01/31/24 appt noted:  Her PHQ-9 score was 22 today.  She describes symptoms of anhedonia, low motivation, difficulty concentrating, poor productivity, difficulty being around people and wanting to isolate.  She is not suicidal but she has had hopelessness other than this opportunity to pursue Spravato .  She does not feel like she can function at work and is discussing leave of absence with her usual provider [employer], PNP. She was so excited to start treatment today that she had trouble sleeping last night. She received her first administration of Spravato  at 56 mg today.  She had no untoward side effects such as nausea dizziness headache or vomiting.  She did have the expected dissociation but it was not scary.  In fact she experienced a relief from negative emotions such as depression and anxiety while under treatment. She was very drowsy and fell asleep.  She explained it as being due to being sleep deprived.  She is eager to continue the Spravato .  02/02/24 appt noted:  She received her second administration of Spravato  at 56 mg today.  She had no untoward side effects such as nausea dizziness headache or vomiting.  She did have the expected dissociation but it was not scary.  In fact she experienced a relief from negative emotions such as depression and anxiety while under  treatment. She was much less drowsy with it.  Would like to increase dose as planned.  No SI.  Tolerating current tx plan.   02/14/24 appt noted: 2 Spravato  84 mg tx last week as planned Here for Spravato  84 mg today.  Expected dissociation well tolerated without NV, HA.  Some dizziness.  Good emotional relief from negativity immediately from Spravato .  Med: no fluoxetine  bc not tolerated Mood sig better by most in decades with Spravato .  Dep is much better.  Anxiety is better too.  Sleep is ok.  Wants to continue tx.  02/16/24 appt noted:  No psych meds bc not tolerated. Here for Spravato  84 mg today.  Expected dissociation well tolerated without NV, HA.  Some dizziness.  Good emotional relief from negativity immediately from Spravato .  Mood so far dramatically better and anxiety better with Spravato .  No new problems.  Wants to continue Spravato .  02/21/24 appt noted:  No psych meds bc not tolerated. Here for Spravato  84 mg today.  Expected dissociation well tolerated without NV, HA.  Some dizziness.  Good emotional relief from negativity immediately from Spravato .  Dep has gone into remission Spravato  and anxiety is much better .  She is very happy with the tx plan.  No new concerns.    02/28/24 appt noted:  No psych meds bc not tolerated. Here for Spravato  84 mg today.  Expected dissociation well tolerated without NV, HA.  Some dizziness.  Good emotional relief from negativity immediately from Spravato .  Dep  unexpectedly worse Saturday and since.  Having experienced relief she doesn't want to go backwards. There are some stressors contributing to it but she wants to continue Spravato  twice weekly until she gets better.  03/01/24 appt noted: No psych meds bc not tolerated. Here for Spravato  84 mg today.  Expected dissociation well tolerated without NV, HA.  Some dizziness.  Good emotional relief from negativity immediately from Spravato .  Felt need bc relapse dep to increase Spravato  back  to twice weekly.   Feels better about getting second spravato  this week.  She believes it will help get her out of the dep like she was before.  Wants to avoid psych med bc hx intolerance.  BP was high this am and required clonidine 0.1 mg which resolved BP px.  She will discuss with PDP later this month.  03/06/24 appt noted: No psych meds bc not tolerated. Here for Spravato  84 mg today.  Expected dissociation well tolerated without NV, HA.  Some dizziness.  Good emotional relief from negativity immediately from Spravato .  Had hypertension this am prior ot Spravato  and was treated with clonidine 0.1 mg which worked.  Results in nurses note. She will talk with PCP on Friday about this bc it has happened several times. Felt need bc relapse dep to increase Spravato  back to twice weekly.  But dep is improving again.  Feels satisfied with tx and wants to continue current plan.  03/08/24 appt noted: No psych meds bc not tolerated. Here for Spravato  84 mg today.  Expected dissociation well tolerated without NV, HA.  Some dizziness.  Good emotional relief from negativity immediately from Spravato .  BP ok today;  Results in nurses note. She will talk with PCP on Friday about this bc it has happened several times. Dep and anxiety are much better with Spravato  and greatly relieved at the benefit for the first time in many years.  03/27/24 appt noted:  No psych meds bc not tolerated. Here for Spravato  84 mg today.  Expected dissociation well tolerated without NV, HA.  Some dizziness.  Good emotional relief from negativity immediately from Spravato .  Results in nurses note. BP mildly elevated at imes today Received clonidine 0.1 mg today and BP normalized. Mood overall  much better with Spravato .However she does have dips if she waits longer than a few days between Spravato  administration.  She feels the twice weekly is more effective at keeping her depression under control.  She has had a little increase in  depression since only receiving 1 Spravato  administration last week.  There are other factors such as work stress that are contributing to a sense of depression and worry.  She has discussed this with her other provider.  03/29/24 appt noted:  No psych meds except alprazolam  1 mg BID prn;  bc not tolerated. Here for Spravato  84 mg today.  Expected dissociation well tolerated without NV, HA.  Some dizziness.  Good emotional relief from negativity immediately from Spravato .  Results in nurses note. BP intermittently high. Plan: Disc Saffron dosing bc she is interested in this for depression off label. The Saffron dosage is 15 - 30 mg daily.  It ideally should be split between morning and evening but if needed can be taken once daily. Also rec probiotic with B. Longum an d she agrees to consider it, bc it is natural.  04/03/24 appt noted:  Meds:  alprazolam  1 mg BID prn; probiotic. Here for Spravato  84 mg today.  Expected dissociation well tolerated without  NV, HA.  Some dizziness.  Good emotional relief from negativity immediately from Spravato .  Results in nurses note. BP mildly elevated at imes today She reports having had 2 panic attacks recently since the last visit in the office.  These were stress related.  There is family stress occurring.  Also stress of the holidays. She reports that she believes her depression is related to dopamine.  She has low drive and low enjoyment of things that are traditionally enjoyable.  It is hard to fight the depression off. She is willing to take a med that increases dopamine as long as it is not a traditional antidepressant.  She reports traditional antidepressants make her depression worse and even give her suicidal thoughts. She has not started saffron yet but is considering it.  She is hesitant because of the cost.  04/17/24 appt noted:  Meds:  alprazolam  1 mg BID prn; probiotic, pramipexole  0.25 mg  Here for Spravato  84 mg today.  Expected dissociation  well tolerated without NV, HA.  Some dizziness.  Good emotional relief from negativity immediately from Spravato .  Results in nurses note. BP mildly elevated at imes today She reports having had 2 panic attacks recently since the last visit in the office.  These were stress related.  There is family stress occurring.  Also stress of the holidays. She feels like pramipexole  is helping some and willing to try it. Spravato  helping.  04/24/24 appt noted:  Med: pramipexole  0.25 mg AM, alprazolam  1 mg BID prn; probiotic, Here for Spravato  84 mg today.  Expected dissociation well tolerated without NV, HA.  Some dizziness.  Good emotional relief from negativity immediately from Spravato .  Results in nurses note.  BP too hight today and received clonidine 0.1 mg AM prior to Spravato  and it helped.   Dep managed and anxiety is better.  Is facing RTW Monday and nervous about it but plans to do so. Also feels ready to try reducing Spravato  to every other week if possible for convenience DT work.  Aware dep might relapse.  Previous medications: Zoloft , fluoxetine , others,  Clonazepam    ECT-MADRS    Flowsheet Row Video Visit from 01/05/2024 in [name]  MADRS Total Score 31   GAD-7    Flowsheet Row Office Visit from 08/31/2022 in [facility] Office Visit from 03/03/2022 in [facility] Office Visit from 11/25/2021 in [facility] Office Visit from 08/12/2021 in [facility] Office Visit from 04/01/2021 in [facility]  Total GAD-7 Score 12 14 5 8 16    PHQ2-9    Flowsheet Row Video Visit from 01/05/2024 in [name] Office Visit from 08/31/2022 in [facility] Office Visit from 03/03/2022 in [facility] Office Visit from 02/10/2022 in [facility] Office Visit from 11/25/2021 in [facility]  PHQ-2 Total Score 4 4 6 5 2   PHQ-9 Total Score -- 17 14 13 8    Flowsheet Row ED from 05/26/2023 in [facility] Emergency Department at [facility] ED from 12/11/2022 in [facility] Emergency Department at [facility] ED from 05/05/2022 in [facility] Emergency Department at [facility]  C-SSRS RISK CATEGORY No Risk No Risk No Risk     Review of Systems:  Review of Systems  Cardiovascular:  Negative for chest pain and palpitations.  Gastrointestinal:  Negative for  nausea and vomiting.  Neurological:  Negative for dizziness, tremors and weakness.  Psychiatric/Behavioral:  Positive for dysphoric mood. Negative for decreased concentration and sleep disturbance. The patient is nervous/anxious.     Medications: I have reviewed the patient's current medications.  Current Outpatient Medications  Medication Sig Dispense Refill   ALPRAZolam  (XANAX ) 1 MG tablet TAKE 1 TABLET BY MOUTH TWICE A DAY AS NEEDED FOR ANXIETY 60 tablet 2   atorvastatin  (LIPITOR) 80 MG tablet Take 1 tablet (80 mg total) by mouth daily. 90 tablet 1   Blood Glucose Monitoring Suppl (ONETOUCH VERIO) w/Device KIT USE TO CHECK BLOOD SUGAR THREE TIMES DAILY. 1 kit 0   Cholecalciferol (VITAMIN D3) 50 MCG (2000 UT) capsule Take 1 capsule (2,000 Units total) by mouth daily. 90 capsule 1   clindamycin  (CLEOCIN ) 2 % vaginal cream Place 1 Applicatorful vaginally at bedtime. 40 g 0   Continuous Blood Gluc Receiver (DEXCOM G6 RECEIVER) DEVI 1 Bag by Does not apply route in the morning, at noon, and at bedtime. 1 each 0   Continuous Glucose Sensor (DEXCOM G6 SENSOR) MISC USE AS DIRECTED EVERY 10 DAYS 9 each 2   Continuous Glucose Transmitter (DEXCOM G6 TRANSMITTER) MISC USE AS DIRECTED OVER 90 DAYS 1 each 2   Continuous Glucose Transmitter (DEXCOM G6 TRANSMITTER) MISC USE AS DIRECTED OVER 90 DAYS     cyclobenzaprine  (FLEXERIL ) 10 MG  tablet Take 1 tablet (10 mg total) by mouth 2 (two) times daily as needed for muscle spasms. 12 tablet 0   Elastic Bandages & Supports (ELBOW COMPRESSION) MISC 1 Application by Does not apply route daily. 1 each 0   glucose blood (ONETOUCH VERIO) test strip USE TO CHECK BLOOD SUGAR THREE TIMES DAILY. 100 each 2   hydrochlorothiazide  (HYDRODIURIL ) 25 MG tablet Take 1 tablet (25 mg total) by mouth daily. 90 tablet 1   hydrOXYzine  (ATARAX ) 25 MG tablet Take 1 tablet (25 mg total) by mouth 3 (three) times daily as needed. 90 tablet 1   Insulin  Glargine (BASAGLAR  KWIKPEN) 100 UNIT/ML Inject 15 Units into the skin daily. 15 mL 0   insulin  lispro (HUMALOG  KWIKPEN) 100 UNIT/ML KwikPen INJECT up to 12u INTO THE SKIN 3 TIMES DAILY WITH MEALS per sliding scale: 200-250: 4 UNITS, 251-300: 6 UNITS, 301-350: 8 UNITS, 351-400: 10 UNITS,>400:12 UNITS AND CALL NP. Max daily dose: 36u 30 mL 0   Insulin  Pen Needle (PEN NEEDLES) 32G X 5 MM MISC Use to inject sliding scale Humalog  three times daily. 100 each 6   JARDIANCE  10 MG TABS tablet Take 10 mg by mouth daily.     lidocaine  (LIDODERM ) 5 % Place 1 patch onto the skin daily. Remove & Discard patch within 12 hours or as directed by MD 30 patch 0   LINZESS  145 MCG CAPS capsule Take 1 capsule (145 mcg total) by mouth daily as needed (constipation). 90 capsule 1   lisinopril  (ZESTRIL ) 20 MG tablet Take 1 tablet (20 mg total) by mouth daily. 90 tablet 1   metFORMIN (GLUCOPHAGE) 500 MG tablet Take 500 mg by mouth daily with breakfast.     metoCLOPramide  (REGLAN ) 10 MG tablet Take 10 mg by mouth every 6 (six) hours as needed.     metoprolol  tartrate (LOPRESSOR ) 25 MG tablet TAKE 1 TABLET BY MOUTH TWICE A DAY 30 tablet 0   norethindrone  (MICRONOR ) 0.35 MG tablet Take 1 tablet (0.35 mg total) by mouth daily. 28 tablet 0   omeprazole  (PRILOSEC) 20 MG capsule TAKE 1  CAPSULE BY MOUTH DAILY IF NEEDED FOR GASTRIC REFLUX 90 capsule 0   ondansetron  (ZOFRAN -ODT) 4 MG disintegrating  tablet TAKE 1 TABLET BY MOUTH EVERY 8 HOURS AS NEEDED FOR NAUSEA AND VOMITING 12 tablet 0   OneTouch Delica Lancets 33G MISC USE TO CHECK BLOOD SUGAR THREE TIMES DAILY. 100 each 2   pramipexole  (MIRAPEX ) 0.25 MG tablet Take 1 tablet (0.25 mg total) by mouth 2 (two) times daily. (Patient taking differently: Take 0.25 mg by mouth every morning.) 60 tablet 0   telmisartan (MICARDIS) 80 MG tablet Take 1 tablet by mouth daily.     No current facility-administered medications for this visit.    Medication Side Effects: None  Allergies: Not on File  Past Medical History:  Diagnosis Date   Anxiety    Diabetes mellitus without complication (HCC)    Gastroparesis    Hypertension    Major depressive disorder     Past Medical History, Surgical history, Social history, and Family history were reviewed and updated as appropriate.   Please see review of systems for further details on the patient's review from today.   Objective:   Physical Exam:  There were no vitals taken for this visit.  Physical Exam Constitutional:      General: She is not in acute distress. Musculoskeletal:        General: No deformity.  Neurological:     Mental Status: She is alert and oriented to person, place, and time.     Coordination: Coordination normal.  Psychiatric:        Attention and Perception: Attention and perception normal. She does not perceive auditory or visual hallucinations.        Mood and Affect: Mood is anxious. Mood is not depressed. Affect is not blunt, angry, tearful or inappropriate.        Speech: Speech normal.        Behavior: Behavior normal.        Thought Content: Thought content normal. Thought content is not paranoid or delusional. Thought content does not include homicidal or suicidal ideation. Thought content does not include suicidal plan.        Cognition and Memory: Cognition and memory normal.        Judgment: Judgment normal.     Comments: Insight intact Dep off and on  with stress. Benefit Spravato .     Lab Review:     Component Value Date/Time   NA 131 (L) 05/25/2023 2132   NA 141 08/31/2022 0954   K 3.5 05/25/2023 2132   CL 94 (L) 05/25/2023 2132   CO2 25 05/25/2023 2132   GLUCOSE 314 (H) 05/25/2023 2132   BUN 10 05/25/2023 2132   BUN 12 08/31/2022 0954   CREATININE 0.87 05/25/2023 2132   CALCIUM  9.5 05/25/2023 2132   PROT 8.4 (H) 05/25/2023 2132   PROT 7.6 08/31/2022 0954   ALBUMIN 4.4 05/25/2023 2132   ALBUMIN 4.3 08/31/2022 0954   AST 71 (H) 05/25/2023 2132   ALT 86 (H) 05/25/2023 2132   ALKPHOS 70 05/25/2023 2132   BILITOT 1.0 05/25/2023 2132   BILITOT 0.3 08/31/2022 0954   GFRNONAA >60 05/25/2023 2132   GFRAA >60 10/10/2019 1441       Component Value Date/Time   WBC 11.8 (H) 05/25/2023 2132   RBC 4.51 05/25/2023 2132   HGB 15.1 (H) 05/25/2023 2132   HGB 13.9 08/31/2022 0954   HCT 42.4 05/25/2023 2132   HCT 41.0 08/31/2022 0954   PLT 187  05/25/2023 2132   PLT 249 08/31/2022 0954   MCV 94.0 05/25/2023 2132   MCV 96 08/31/2022 0954   MCH 33.5 05/25/2023 2132   MCHC 35.6 05/25/2023 2132   RDW 12.5 05/25/2023 2132   RDW 12.6 08/31/2022 0954   LYMPHSABS 0.5 (L) 05/25/2023 2132   LYMPHSABS 2.3 08/31/2022 0954   MONOABS 0.5 05/25/2023 2132   EOSABS 0.2 05/25/2023 2132   EOSABS 0.3 08/31/2022 0954   BASOSABS 0.0 05/25/2023 2132   BASOSABS 0.1 08/31/2022 0954    No results found for: POCLITH, LITHIUM   No results found for: PHENYTOIN, PHENOBARB, VALPROATE, CBMZ   .res Assessment: Plan:    Gwendolyn Carrillo was seen today for follow-up, depression, anxiety and medication reaction.  Diagnoses and all orders for this visit:  Recurrent major depression resistant to treatment  Generalized anxiety disorder  Panic attacks  Mixed obsessional thoughts and acts  Insomnia, unspecified type  Primary hypertension     Disc her TRD and the SE Spravato .  She is very excited to be receiving this bc poor tolerance AD  and no sig benefit with AD. Markedly better with Spravato   but not 100%.    Patient was administered Spravato  84 mg intranasally today.  The patient experienced the typical dissociation which gradually resolved over the 2-hour period of observation.  There were no complications.  Specifically the patient did not have nausea or vomiting or headache.  Blood pressures remained within normal ranges at the 40-minute and 2-hour follow-up intervals.  By the time the 2-hour observation period was met the patient was alert and oriented and able to exit without assistance.  Patient feels the Spravato  administration is helpful for the treatment resistant depression and would like to continue the treatment.  See nursing note for further details.  BP issues.  Continue to discuss with PCP.  Requires clonidine 0.1 mg prn in office and tolerated well.  Continue  84 mg Spravato  bc helps and well tolerated.      Consider alternatives to traditional AD given hx intolerance.  This might help reduce relapse risk.  Example probiotics, Saffron, low dose lithium.   Disc Saffron dosing bc she is interested in this for depression off label. The Saffron dosage is 15 - 30 mg daily.  It ideally should be split between morning and evening but if needed can be taken once daily.  She has not taken yet DT cost Also rec probiotic with B. Longum an d she agreesand she is taking it bc it is natural.  She disc belief her depression is dopamine problem.  Disc at length that there is an off label alternative to traditional AD (which she reports make her dep and SI worse) which does increase DA, pramipexole .  Disc SE risks including N, dizzy etc.  but also rare risks hallucinations, impulsivity.   She is very motivated to try something that increases DA but wants to start very low.  Disc dosage range.  Continue pramipexole  0.25 mg AM.  consider increase as tolerated.  Disc SE She feels this is helpful so far.  Ins SE if taken  late.  Counseling around other activities that could help dep including exercise, socialization, even routine of work.  Follow-up twice weekly for Spravato  administration  Lorene Macintosh MD, DFAPA  Please see After Visit Summary for patient specific instructions.  Future Appointments  Date Time Provider Department Center  04/27/2024 11:30 AM Mozingo, Regina Nattalie, NP [facility] None  05/03/2024 12:00 PM Prentiss Annabella LABOR, NP [facility]  None  05/10/2024  9:30 AM Cottle, Lorene KANDICE Raddle., MD [facility] None  05/10/2024  9:30 AM CP-NURSE [facility] None    No orders of the defined types were placed in this encounter.   -------------------------------

## 2024-04-24 NOTE — Progress Notes (Signed)
 NURSES NOTE:         Pt arrived for her #21 Spravato  Treatment for treatment resistant depression, pt will be getting 84 mg (3 of the 28 mg inhalers), for continued maintenance dose. Pt sees [employer] and she will follow up with her for office visits and see her at treatment when she is in the office. Explained to pt how the treatments would be scheduled and answered any questions she had today. Pt's Spravato  is billed through buy and bill medically.  Spravato  medication is stored at treatment center per REMS/FDA guidelines. The medication is required to be locked behind two doors per REMS/FDA protocol. Medication is also disposed of properly after each use per regulations. All documentation for REMS is completed and submitted per FDA/REMS requirements.          Began taking patient's vital signs at 9:30 AM 157/98, pulse 99, SpO2 Sat. 99%, left pt take 5 minutes to see if her B/P would drop any. Rechecked at 9:35 AM still elevated at 152/99, pulse 91. Dr. Geoffry advised to give clonidine 0.1 mg when starting treatment, pt has previously tolerated clonidine.  Instructed patient to blow her nose if needed then recline back to a 45 degree angle. Gave patient first dose 28 mg nasal spray, administered in each nostril as directed and observed by nurse, waited 5 more minutes for the second and third doses. After all doses given pt did not complain of any nausea/vomiting, pt did have a drink to help with the taste of Spravato  it gives the metallic taste. Assessed her 40 minute vitals, 10:25 AM, 136/87, pulse 87, SpO2 96%.  Explained she would be monitored for a total time of 120 minutes after beginning treatment. Discharge vitals were taken at 11:35 AM 136/84, P 85, SpO2 97%. Dr. Geoffry met with pt once her thoughts were clearer to discuss how treatment went. After discussion it has been decided pt will decrease her treatments to every other week starting 05/10/24 as her first apt back in 2026. Pt is  returning to work in January. Recommend she go home and sleep or just relax on the couch. No driving, no intense activities. Verbalized understanding. Nurse was with pt a total of 60 minutes for clinical assessment.  Pt will get 84 mg.   (84 mg)  LOT [id number] EXP [id number]

## 2024-04-25 ENCOUNTER — Other Ambulatory Visit: Payer: Self-pay | Admitting: Psychiatry

## 2024-04-25 DIAGNOSIS — F339 Major depressive disorder, recurrent, unspecified: Secondary | ICD-10-CM

## 2024-04-27 ENCOUNTER — Telehealth: Admitting: Adult Health

## 2024-04-27 ENCOUNTER — Encounter: Payer: Self-pay | Admitting: Adult Health

## 2024-04-27 DIAGNOSIS — F411 Generalized anxiety disorder: Secondary | ICD-10-CM | POA: Diagnosis not present

## 2024-04-27 DIAGNOSIS — F422 Mixed obsessional thoughts and acts: Secondary | ICD-10-CM | POA: Diagnosis not present

## 2024-04-27 DIAGNOSIS — F410 Panic disorder [episodic paroxysmal anxiety] without agoraphobia: Secondary | ICD-10-CM | POA: Diagnosis not present

## 2024-04-27 DIAGNOSIS — G4700 Insomnia, unspecified: Secondary | ICD-10-CM

## 2024-04-27 DIAGNOSIS — F339 Major depressive disorder, recurrent, unspecified: Secondary | ICD-10-CM

## 2024-04-27 NOTE — Progress Notes (Signed)
 Gwendolyn Carrillo [id number] [birth year] 49 y.o.  Virtual Visit via Video Note  I connected with pt @ on 04/27/2024 at 11:30 AM EST by a video enabled telemedicine application and verified that I am speaking with the correct person using two identifiers.   I discussed the limitations of evaluation and management by telemedicine and the availability of in person appointments. The patient expressed understanding and agreed to proceed.  I discussed the assessment and treatment plan with the patient. The patient was provided an opportunity to ask questions and all were answered. The patient agreed with the plan and demonstrated an understanding of the instructions.   The patient was advised to call back or seek an in-person evaluation if the symptoms worsen or if the condition fails to improve as anticipated.  I provided 25 minutes of non-face-to-face time during this encounter.  The patient was located at home.  The provider was located at [facility].   Angeline LOISE Sayers, NP   Subjective:   Patient ID:  Gwendolyn Carrillo is a 49 y.o. (DOB 07/09/1975) female.  Chief Complaint: No chief complaint on file.   HPI Gwendolyn Carrillo presents for follow-up of MDD, mixed obsessional thoughts, insomnia, panic attacks and GAD.   Describes mood today as better. Pleasant. Reports tearfulness. Mood symptoms - reports decreased depression with the Spravato  treatments. Report decreased anxiety - doing better in situations - not as easily triggered. Denies irritability. Reports improved interest and motivation. Denies recent panic attacks. Reports some worry, rumination and over thinking - more situational. Reports family and financial stressors. Reports  hormonal fluctuations also effecting mood - plans to see OB on May 03, 2024. Reports mood has improved with the Spravato  treatments and medication changes. Stating I feel like I'm doing better. She feels like she is ready to return to  the work setting. Reports seeing a therapist - Verne[facility] - [place]. Taking medications as prescribed.  Reports energy levels have improved. Active, does not have a regular exercise routine with pain in both feet. Reports she is able to enjoy some usual interests and activities. Lives alone. Has 4 children. Family local. Spending time with family.  Appetite adequate. Weight stable - 190 pounds.  Reports sleep has improved. Averages 8 or more hours of sleep. Reports focus and concentration has improved. Reports improved self care. Managing some aspects of household. Typically works full time - remote position with [employer] - out of work currently.  Denies SI or HI.  Denies AH or VH. Denies self harm. Denies substance use. Denies alcohol use.  Previous medications: Zoloft , Clonazepam   Review of Systems:  Review of Systems  Musculoskeletal:  Negative for gait problem.  Neurological:  Negative for tremors.  Psychiatric/Behavioral:         Please refer to HPI    Medications: I have reviewed the patient's current medications.  Current Outpatient Medications  Medication Sig Dispense Refill   ALPRAZolam  (XANAX ) 1 MG tablet TAKE 1 TABLET BY MOUTH TWICE A DAY AS NEEDED FOR ANXIETY 60 tablet 2   atorvastatin  (LIPITOR) 80 MG tablet Take 1 tablet (80 mg total) by mouth daily. 90 tablet 1   Blood Glucose Monitoring Suppl (ONETOUCH VERIO) w/Device KIT USE TO CHECK BLOOD SUGAR THREE TIMES DAILY. 1 kit 0   Cholecalciferol (VITAMIN D3) 50 MCG (2000 UT) capsule Take 1 capsule (2,000 Units total) by mouth daily. 90 capsule 1   clindamycin  (CLEOCIN ) 2 % vaginal cream Place 1 Applicatorful vaginally at bedtime. 40  g 0   Continuous Blood Gluc Receiver (DEXCOM G6 RECEIVER) DEVI 1 Bag by Does not apply route in the morning, at noon, and at bedtime. 1 each 0   Continuous Glucose Sensor (DEXCOM G6 SENSOR) MISC USE AS DIRECTED EVERY 10 DAYS 9 each 2   Continuous Glucose Transmitter (DEXCOM G6 TRANSMITTER) MISC USE  AS DIRECTED OVER 90 DAYS 1 each 2   Continuous Glucose Transmitter (DEXCOM G6 TRANSMITTER) MISC USE AS DIRECTED OVER 90 DAYS     cyclobenzaprine  (FLEXERIL ) 10 MG tablet Take 1 tablet (10 mg total) by mouth 2 (two) times daily as needed for muscle spasms. 12 tablet 0   Elastic Bandages & Supports (ELBOW COMPRESSION) MISC 1 Application by Does not apply route daily. 1 each 0   glucose blood (ONETOUCH VERIO) test strip USE TO CHECK BLOOD SUGAR THREE TIMES DAILY. 100 each 2   hydrochlorothiazide  (HYDRODIURIL ) 25 MG tablet Take 1 tablet (25 mg total) by mouth daily. 90 tablet 1   hydrOXYzine  (ATARAX ) 25 MG tablet Take 1 tablet (25 mg total) by mouth 3 (three) times daily as needed. 90 tablet 1   Insulin  Glargine (BASAGLAR  KWIKPEN) 100 UNIT/ML Inject 15 Units into the skin daily. 15 mL 0   insulin  lispro (HUMALOG  KWIKPEN) 100 UNIT/ML KwikPen INJECT up to 12u INTO THE SKIN 3 TIMES DAILY WITH MEALS per sliding scale: 200-250: 4 UNITS, 251-300: 6 UNITS, 301-350: 8 UNITS, 351-400: 10 UNITS,>400:12 UNITS AND CALL NP. Max daily dose: 36u 30 mL 0   Insulin  Pen Needle (PEN NEEDLES) 32G X 5 MM MISC Use to inject sliding scale Humalog  three times daily. 100 each 6   JARDIANCE  10 MG TABS tablet Take 10 mg by mouth daily.     lidocaine  (LIDODERM ) 5 % Place 1 patch onto the skin daily. Remove & Discard patch within 12 hours or as directed by MD 30 patch 0   LINZESS  145 MCG CAPS capsule Take 1 capsule (145 mcg total) by mouth daily as needed (constipation). 90 capsule 1   lisinopril  (ZESTRIL ) 20 MG tablet Take 1 tablet (20 mg total) by mouth daily. 90 tablet 1   metFORMIN (GLUCOPHAGE) 500 MG tablet Take 500 mg by mouth daily with breakfast.     metoCLOPramide  (REGLAN ) 10 MG tablet Take 10 mg by mouth every 6 (six) hours as needed.     metoprolol  tartrate (LOPRESSOR ) 25 MG tablet TAKE 1 TABLET BY MOUTH TWICE A DAY 30 tablet 0   norethindrone  (MICRONOR ) 0.35 MG tablet Take 1 tablet (0.35 mg total) by mouth daily. 28  tablet 0   omeprazole  (PRILOSEC) 20 MG capsule TAKE 1 CAPSULE BY MOUTH DAILY IF NEEDED FOR GASTRIC REFLUX 90 capsule 0   ondansetron  (ZOFRAN -ODT) 4 MG disintegrating tablet TAKE 1 TABLET BY MOUTH EVERY 8 HOURS AS NEEDED FOR NAUSEA AND VOMITING 12 tablet 0   OneTouch Delica Lancets 33G MISC USE TO CHECK BLOOD SUGAR THREE TIMES DAILY. 100 each 2   pramipexole  (MIRAPEX ) 0.25 MG tablet Take 1 tablet (0.25 mg total) by mouth every morning. 90 tablet 0   telmisartan (MICARDIS) 80 MG tablet Take 1 tablet by mouth daily.     No current facility-administered medications for this visit.    Medication Side Effects: None  Allergies: Allergies[1]  Past Medical History:  Diagnosis Date   Anxiety    Diabetes mellitus without complication (HCC)    Gastroparesis    Hypertension    Major depressive disorder     Family History  Problem  Relation Age of Onset   Hyperlipidemia Mother    Hypertension Mother    Hyperlipidemia Father    Hypertension Father    Diabetes Father    Diabetes Sister    Blindness Sister    Hypertension Brother    Diabetes Brother    Stroke Brother    Diabetes Maternal Aunt    Diabetes Maternal Grandmother    Hypertension Maternal Grandmother    Heart disease Maternal Grandmother    Diabetes Paternal Grandmother    Hypertension Paternal Grandmother    Heart disease Paternal Grandmother     Social History   Socioeconomic History   Marital status: Single    Spouse name: Not on file   Number of children: Not on file   Years of education: Not on file   Highest education level: Not on file  Occupational History   Not on file  Tobacco Use   Smoking status: Some Days    Current packs/day: 0.25    Types: Cigarettes   Smokeless tobacco: Never   Tobacco comments:    I smoke when I'm drinking  Vaping Use   Vaping status: Never Used  Substance and Sexual Activity   Alcohol use: Yes    Comment: occ   Drug use: Not Currently    Types: Marijuana   Sexual  activity: Not Currently    Partners: Male    Comment: First IC <16, Partners >5, Hx of CT+, GC+  Other Topics Concern   Not on file  Social History Narrative   Not on file   Social Drivers of Health   Tobacco Use: High Risk (04/24/2024)   Patient History    Smoking Tobacco Use: Some Days    Smokeless Tobacco Use: Never    Passive Exposure: Not on file  Financial Resource Strain: Not on file  Food Insecurity: Low Risk (03/09/2024)   Received from [facility]   Epic    Within the past 12 months, you worried that your food would run out before you got money to buy more: Never true    Within the past 12 months, the food you bought just didn't last and you didn't have money to get more. : Never true  Transportation Needs: No Transportation Needs (03/09/2024)   Received from [facility]    In the past 12 months, has lack of reliable transportation kept you from medical appointments, meetings, work or from getting things needed for daily living? : No  Physical Activity: Not on file  Stress: Not on file  Social Connections: Unknown (08/25/2021)   Received from [facility]   Social Network    Social Network: Not on file  Intimate Partner Violence: Unknown (07/30/2021)   Received from [facility]   HITS    Physically Hurt: Not on file    Insult or Talk Down To: Not on file    Threaten Physical Harm: Not on file    Scream or Curse: Not on file  Depression (PHQ2-9): Low Risk (01/05/2024)   Depression (PHQ2-9)    PHQ-2 Score: 4  Alcohol Screen: Not on file  Housing: Low Risk (03/09/2024)   Received from [facility]   Epic    What is your living situation today?: I have a steady place to live    Think about the place you live. Do you have problems with any of the following? Choose all that apply:: None/None on this list  Utilities: Low Risk (03/09/2024)   Received from [facility]   Utilities    In the past 12 months has the electric, gas, oil, or water  company threatened to shut off services in your home? : No  Health Literacy: Not on file    Past Medical History, Surgical history, Social history, and Family history were reviewed and updated as appropriate.   Please see review of systems for further details on the patient's review from today.   Objective:   Physical Exam:  There were no vitals taken for this visit.  Physical Exam Constitutional:      General: She is not in acute distress. Musculoskeletal:        General: No deformity.  Neurological:     Mental Status: She is alert and oriented to person, place, and time.     Coordination: Coordination normal.  Psychiatric:        Attention and Perception: Attention and perception normal. She does not perceive auditory or visual hallucinations.        Mood and Affect: Mood normal. Mood is not anxious or depressed. Affect is not labile, blunt, angry or inappropriate.        Speech: Speech normal.        Behavior: Behavior normal.        Thought Content: Thought content normal. Thought content is not paranoid or delusional. Thought content does not include homicidal or suicidal ideation. Thought content does not include homicidal or suicidal plan.        Cognition and Memory: Cognition and memory normal.        Judgment: Judgment normal.     Comments: Insight intact     Lab Review:     Component Value Date/Time   NA 131 (L) 05/25/2023 2132   NA 141 08/31/2022 0954   K 3.5 05/25/2023 2132   CL 94 (L) 05/25/2023 2132   CO2 25 05/25/2023 2132   GLUCOSE 314 (H) 05/25/2023 2132   BUN 10 05/25/2023 2132   BUN 12 08/31/2022 0954   CREATININE 0.87 05/25/2023 2132   CALCIUM  9.5 05/25/2023 2132   PROT 8.4 (H) 05/25/2023 2132   PROT 7.6 08/31/2022 0954   ALBUMIN 4.4 05/25/2023 2132   ALBUMIN 4.3 08/31/2022 0954   AST 71 (H) 05/25/2023 2132   ALT 86 (H) 05/25/2023 2132   ALKPHOS 70 05/25/2023 2132   BILITOT 1.0 05/25/2023 2132   BILITOT 0.3 08/31/2022 0954   GFRNONAA >60  05/25/2023 2132   GFRAA >60 10/10/2019 1441       Component Value Date/Time   WBC 11.8 (H) 05/25/2023 2132   RBC 4.51 05/25/2023 2132   HGB 15.1 (H) 05/25/2023 2132   HGB 13.9 08/31/2022 0954   HCT 42.4 05/25/2023 2132   HCT 41.0 08/31/2022 0954   PLT 187 05/25/2023 2132   PLT 249 08/31/2022 0954   MCV 94.0 05/25/2023 2132   MCV 96 08/31/2022 0954   MCH 33.5 05/25/2023 2132   MCHC 35.6 05/25/2023 2132   RDW 12.5 05/25/2023 2132   RDW 12.6 08/31/2022 0954   LYMPHSABS 0.5 (L) 05/25/2023 2132   LYMPHSABS 2.3 08/31/2022 0954   MONOABS 0.5 05/25/2023 2132   EOSABS 0.2 05/25/2023 2132   EOSABS 0.3 08/31/2022 0954   BASOSABS 0.0 05/25/2023 2132   BASOSABS 0.1 08/31/2022 0954    No results found for: POCLITH, LITHIUM   No results found for: PHENYTOIN, PHENOBARB, VALPROATE, CBMZ   .res Assessment: Plan:    Plan:  PDMP reviewed  Mirapex  - 0.25mg  every morning  Xanax  1mg  BID as needed for anxiety and panic attacks Hydroxyzine  25mg  TID as needed  D/C Prozac  10mg  daily   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  Gene sight testing available.  Working with a paramedic.  RTC 2 months for medication check - will continue Spravato  bi weekly.  25 minutes spent dedicated to the care of this patient on the date of this encounter to include pre-visit review of records, ordering of medication, post visit documentation, and face-to-face time with the patient discussing TRD, mixed obsessional thoughts, insomnia, panic attacks, and GAD. Discussed continuing current medication regimen. Patient may return to work on 04/30/2024 without restrictions.  Patient was advised to contact office with any questions, adverse effects, or acute worsening in signs and symptoms.   Discussed potential benefits, risk, and side effects of benzodiazepines to include potential risk of tolerance and dependence, as well as  possible drowsiness. Advised patient not to drive if experiencing drowsiness and to take lowest possible effective dose to minimize risk of dependence and tolerance.  There are no diagnoses linked to this encounter.   Please see After Visit Summary for patient specific instructions.  Future Appointments  Date Time Provider Department Center  04/27/2024 11:30 AM Japhet Morgenthaler, Angeline Mattocks, NP [facility] None  05/03/2024 12:00 PM Prentiss Annabella LABOR, NP [facility] None  05/10/2024  9:30 AM Cottle, Lorene KANDICE Raddle., MD [facility] None  05/10/2024  9:30 AM CP-NURSE [facility] None    No orders of the defined types were placed in this encounter.     -------------------------------      [1] Not on File

## 2024-05-03 ENCOUNTER — Other Ambulatory Visit: Payer: Self-pay | Admitting: Cardiology

## 2024-05-03 ENCOUNTER — Ambulatory Visit: Payer: Self-pay | Admitting: Nurse Practitioner

## 2024-05-03 DIAGNOSIS — R002 Palpitations: Secondary | ICD-10-CM

## 2024-05-03 NOTE — Progress Notes (Unsigned)
" ° °  Gwendolyn Carrillo March 01, 1976 [id number]   History:  49 y.o. [id number] presents for annual exam. S/P 2009 ablation. POPs. Normal pap history. DM, HLD, HTN managed by PCP. Smokes ~4 cigarettes per day, began smoking at age 65. H/O GAD, anxiety, panic attacks managed by psychiatry. Also see therapist.   Gynecologic History No LMP recorded. Patient has had an ablation.   Contraception/Family planning: oral progesterone-only contraceptive and tubal ligation Sexually active: ***  Health Maintenance Last Pap: 10/2019. Results were: Normal neg HPV Last mammogram: ***. Results were:  Last colonoscopy: Never. Negative Cologuard 08/2022 Last Dexa: Not indicated     01/05/2024    2:52 PM  Depression screen PHQ 2/9  Decreased Interest   Down, Depressed, Hopeless   PHQ - 2 Score      Information is confidential and restricted. Go to Review Flowsheets to unlock data.     Past medical history, past surgical history, family history and social history were all reviewed and documented in the EPIC chart.  ROS:  A ROS was performed and pertinent positives and negatives are included.  Exam:  There were no vitals filed for this visit. There is no height or weight on file to calculate BMI.  General appearance:  Normal Thyroid :  Symmetrical, normal in size, without palpable masses or nodularity. Respiratory  Auscultation:  Clear without wheezing or rhonchi Cardiovascular  Auscultation:  Regular rate, without rubs, murmurs or gallops  Edema/varicosities:  Not grossly evident Abdominal  Soft,nontender, without masses, guarding or rebound.  Liver/spleen:  No organomegaly noted  Hernia:  None appreciated  Skin  Inspection:  Grossly normal Breasts: Examined lying and sitting.   Right: Without masses, retractions, nipple discharge or axillary adenopathy.   Left: Without masses, retractions, nipple discharge or axillary adenopathy. Pelvic: External genitalia:  no lesions              Urethra:   normal appearing urethra with no masses, tenderness or lesions              Bartholins and Skenes: normal                 Vagina: normal appearing vagina with normal color and discharge, no lesions              Cervix: no lesions Bimanual Exam:  Uterus:  no masses or tenderness              Adnexa: no mass, fullness, tenderness              Rectovaginal: Deferred              Anus:  normal, no lesions  Gwendolyn Carrillo, CMA present as chaperone.   Assessment/Plan:  49 y.o. [id number] for annual exam.    No follow-ups on file.   Gwendolyn DELENA Shutter DNP, 10:11 AM 05/03/2024 "

## 2024-05-09 ENCOUNTER — Other Ambulatory Visit: Payer: Self-pay | Admitting: Cardiology

## 2024-05-09 DIAGNOSIS — R002 Palpitations: Secondary | ICD-10-CM

## 2024-05-10 ENCOUNTER — Ambulatory Visit: Admitting: Emergency Medicine

## 2024-05-10 ENCOUNTER — Encounter: Admitting: Psychiatry

## 2024-05-10 ENCOUNTER — Ambulatory Visit (INDEPENDENT_AMBULATORY_CARE_PROVIDER_SITE_OTHER)

## 2024-05-10 VITALS — BP 145/90 | HR 89

## 2024-05-10 DIAGNOSIS — F339 Major depressive disorder, recurrent, unspecified: Secondary | ICD-10-CM

## 2024-05-10 DIAGNOSIS — F410 Panic disorder [episodic paroxysmal anxiety] without agoraphobia: Secondary | ICD-10-CM

## 2024-05-10 DIAGNOSIS — I10 Essential (primary) hypertension: Secondary | ICD-10-CM

## 2024-05-10 DIAGNOSIS — F411 Generalized anxiety disorder: Secondary | ICD-10-CM

## 2024-05-10 DIAGNOSIS — F422 Mixed obsessional thoughts and acts: Secondary | ICD-10-CM

## 2024-05-10 DIAGNOSIS — G4700 Insomnia, unspecified: Secondary | ICD-10-CM

## 2024-05-10 NOTE — Progress Notes (Signed)
 Gwendolyn Carrillo [id number] 1976/03/19 49 y.o.  Subjective:   Patient ID:  Gwendolyn Carrillo is a 49 y.o. (DOB 01-02-1976) female.  Chief Complaint:  Chief Complaint  Patient presents with   Follow-up   Anxiety   Depression   Stress    HPI Gwendolyn Carrillo presents to the office today for follow-up of treatment resistant major depression. She also has a hx of GAD, OCD, and panic.  Current medication regimen: No SE reported. Alprazolam  1 mg BID PRN anxiety and panic attacks Hydroxyzine  25mg  TID PRN Mirapex  .25mg    Pt discontinued Pramipexole  0.25 mg AM. Reports that it is no longer effective.    Patient tolerated Spravato  84 mg well with expected dissociation. No N,V or HA reported. Mild dizziness noted. Pt was tearful after session and stated, I will be turning 49 next week and I do not know why I have been feeling sad for most of my life. However, reports Spravato  has been helping her since 2022. Pre-treatment BP: 150/97; repeat BP:148/96. Received clonidine 0.1 mg PO prior to tx. 40 minutes post: BP 137/94. End of treatment: BP 145/90.  Patient resumed work January 5th, currently [employer] Monday - Wednesday and Friday. Requests weekly Spravato  sessions on Thursdays for ongoing treatment. No adverse effects from recent sessions.  Denies mania, delirium, AVH, SI, HI, or self-harm behaviors. No further complaints at this time.   Previous medications:  Zoloft ,  Fluoxetine  Clonazepam      ECT-MADRS    Flowsheet Row Video Visit from 01/05/2024 in [name]  MADRS Total Score 31   GAD-7    Flowsheet Row Office Visit from 08/31/2022 in [facility] Office Visit from 03/03/2022 in [facility] Office Visit from 11/25/2021 in [facility] Office Visit from 08/12/2021 in [facility] Office Visit from 04/01/2021 in [facility]   Total GAD-7 Score 12 14 5 8 16    PHQ2-9    Flowsheet Row Video Visit from 01/05/2024 in [name] Office Visit from 08/31/2022 in [facility] Office Visit from 03/03/2022 in [facility] Office Visit from 02/10/2022 in [facility] Office Visit from 11/25/2021 in [facility]  PHQ-2 Total Score 4 4 6 5 2   PHQ-9 Total Score -- 17 14 13 8    Flowsheet Row ED from 05/26/2023 in [facility] Emergency Department at [facility] ED from 12/11/2022 in [facility] Emergency Department at [facility] ED from 05/05/2022 in [facility] Emergency Department at [facility]  C-SSRS RISK CATEGORY No Risk No Risk No Risk     Review of Systems:  Review of Systems  Cardiovascular:  Negative for chest pain and palpitations.  Gastrointestinal:  Negative for nausea and vomiting.  Neurological:  Negative for dizziness, tremors and weakness.  Psychiatric/Behavioral:  Positive for dysphoric mood. Negative for decreased concentration and sleep disturbance. The patient is nervous/anxious.     Medications: I have reviewed the patient's current medications.  Current Outpatient Medications  Medication Sig Dispense Refill   ALPRAZolam  (XANAX ) 1 MG tablet TAKE 1 TABLET BY MOUTH TWICE A DAY AS NEEDED FOR ANXIETY 60 tablet 2   atorvastatin  (LIPITOR) 80 MG tablet Take 1 tablet (80 mg total) by mouth daily. 90 tablet 1   Blood Glucose Monitoring Suppl (ONETOUCH VERIO) w/Device KIT USE TO CHECK BLOOD SUGAR THREE TIMES DAILY. 1  kit 0   Cholecalciferol (VITAMIN D3) 50 MCG (2000 UT) capsule Take 1 capsule (2,000 Units total) by mouth daily. 90 capsule 1   clindamycin  (CLEOCIN ) 2 % vaginal cream Place 1 Applicatorful vaginally at bedtime. 40 g 0   Continuous Blood Gluc Receiver (DEXCOM G6 RECEIVER) DEVI 1 Bag by Does not apply route in the morning, at noon, and at bedtime.  1 each 0   Continuous Glucose Sensor (DEXCOM G6 SENSOR) MISC USE AS DIRECTED EVERY 10 DAYS 9 each 2   Continuous Glucose Transmitter (DEXCOM G6 TRANSMITTER) MISC USE AS DIRECTED OVER 90 DAYS 1 each 2   Continuous Glucose Transmitter (DEXCOM G6 TRANSMITTER) MISC USE AS DIRECTED OVER 90 DAYS     cyclobenzaprine  (FLEXERIL ) 10 MG tablet Take 1 tablet (10 mg total) by mouth 2 (two) times daily as needed for muscle spasms. 12 tablet 0   Elastic Bandages & Supports (ELBOW COMPRESSION) MISC 1 Application by Does not apply route daily. 1 each 0   glucose blood (ONETOUCH VERIO) test strip USE TO CHECK BLOOD SUGAR THREE TIMES DAILY. 100 each 2   hydrochlorothiazide  (HYDRODIURIL ) 25 MG tablet Take 1 tablet (25 mg total) by mouth daily. 90 tablet 1   hydrOXYzine  (ATARAX ) 25 MG tablet Take 1 tablet (25 mg total) by mouth 3 (three) times daily as needed. 90 tablet 1   Insulin  Glargine (BASAGLAR  KWIKPEN) 100 UNIT/ML Inject 15 Units into the skin daily. 15 mL 0   insulin  lispro (HUMALOG  KWIKPEN) 100 UNIT/ML KwikPen INJECT up to 12u INTO THE SKIN 3 TIMES DAILY WITH MEALS per sliding scale: 200-250: 4 UNITS, 251-300: 6 UNITS, 301-350: 8 UNITS, 351-400: 10 UNITS,>400:12 UNITS AND CALL NP. Max daily dose: 36u 30 mL 0   Insulin  Pen Needle (PEN NEEDLES) 32G X 5 MM MISC Use to inject sliding scale Humalog  three times daily. 100 each 6   JARDIANCE  10 MG TABS tablet Take 10 mg by mouth daily.     lidocaine  (LIDODERM ) 5 % Place 1 patch onto the skin daily. Remove & Discard patch within 12 hours or as directed by MD 30 patch 0   LINZESS  145 MCG CAPS capsule Take 1 capsule (145 mcg total) by mouth daily as needed (constipation). 90 capsule 1   lisinopril  (ZESTRIL ) 20 MG tablet Take 1 tablet (20 mg total) by mouth daily. 90 tablet 1   metFORMIN (GLUCOPHAGE) 500 MG tablet Take 500 mg by mouth daily with breakfast.     metoCLOPramide  (REGLAN ) 10 MG tablet Take 10 mg by mouth every 6 (six) hours as needed.     metoprolol  tartrate  (LOPRESSOR ) 25 MG tablet TAKE 1 TABLET BY MOUTH TWICE A DAY 30 tablet 0   norethindrone  (MICRONOR ) 0.35 MG tablet Take 1 tablet (0.35 mg total) by mouth daily. 28 tablet 0   omeprazole  (PRILOSEC) 20 MG capsule TAKE 1 CAPSULE BY MOUTH DAILY IF NEEDED FOR GASTRIC REFLUX 90 capsule 0   ondansetron  (ZOFRAN -ODT) 4 MG disintegrating tablet TAKE 1 TABLET BY MOUTH EVERY 8 HOURS AS NEEDED FOR NAUSEA AND VOMITING 12 tablet 0   OneTouch Delica Lancets 33G MISC USE TO CHECK BLOOD SUGAR THREE TIMES DAILY. 100 each 2   pramipexole  (MIRAPEX ) 0.25 MG tablet Take 1 tablet (0.25 mg total) by mouth every morning. 90 tablet 0   telmisartan (MICARDIS) 80 MG tablet Take 1 tablet by mouth daily.     No current facility-administered medications for this visit.    Medication Side Effects: None  Allergies: Not on  File  Past Medical History:  Diagnosis Date   Anxiety    Diabetes mellitus without complication (HCC)    Gastroparesis    Hypertension    Major depressive disorder     Past Medical History, Surgical history, Social history, and Family history were reviewed and updated as appropriate.   Please see review of systems for further details on the patient's review from today.   Objective:   Physical Exam:  There were no vitals taken for this visit.  Physical Exam Constitutional:      General: She is not in acute distress. Musculoskeletal:        General: No deformity.  Neurological:     Mental Status: She is alert and oriented to person, place, and time.     Coordination: Coordination normal.  Psychiatric:        Attention and Perception: Attention and perception normal. She does not perceive auditory or visual hallucinations.        Mood and Affect: Mood is anxious. Mood is not depressed. Affect is not blunt, angry, tearful or inappropriate.        Speech: Speech normal.        Behavior: Behavior normal.        Thought Content: Thought content normal. Thought content is not paranoid or  delusional. Thought content does not include homicidal or suicidal ideation. Thought content does not include suicidal plan.        Cognition and Memory: Cognition and memory normal.        Judgment: Judgment normal.     Comments: Insight intact Dep off and on with stress. Benefit Spravato .     Lab Review:     Component Value Date/Time   NA 131 (L) 05/25/2023 2132   NA 141 08/31/2022 0954   K 3.5 05/25/2023 2132   CL 94 (L) 05/25/2023 2132   CO2 25 05/25/2023 2132   GLUCOSE 314 (H) 05/25/2023 2132   BUN 10 05/25/2023 2132   BUN 12 08/31/2022 0954   CREATININE 0.87 05/25/2023 2132   CALCIUM  9.5 05/25/2023 2132   PROT 8.4 (H) 05/25/2023 2132   PROT 7.6 08/31/2022 0954   ALBUMIN 4.4 05/25/2023 2132   ALBUMIN 4.3 08/31/2022 0954   AST 71 (H) 05/25/2023 2132   ALT 86 (H) 05/25/2023 2132   ALKPHOS 70 05/25/2023 2132   BILITOT 1.0 05/25/2023 2132   BILITOT 0.3 08/31/2022 0954   GFRNONAA >60 05/25/2023 2132   GFRAA >60 10/10/2019 1441       Component Value Date/Time   WBC 11.8 (H) 05/25/2023 2132   RBC 4.51 05/25/2023 2132   HGB 15.1 (H) 05/25/2023 2132   HGB 13.9 08/31/2022 0954   HCT 42.4 05/25/2023 2132   HCT 41.0 08/31/2022 0954   PLT 187 05/25/2023 2132   PLT 249 08/31/2022 0954   MCV 94.0 05/25/2023 2132   MCV 96 08/31/2022 0954   MCH 33.5 05/25/2023 2132   MCHC 35.6 05/25/2023 2132   RDW 12.5 05/25/2023 2132   RDW 12.6 08/31/2022 0954   LYMPHSABS 0.5 (L) 05/25/2023 2132   LYMPHSABS 2.3 08/31/2022 0954   MONOABS 0.5 05/25/2023 2132   EOSABS 0.2 05/25/2023 2132   EOSABS 0.3 08/31/2022 0954   BASOSABS 0.0 05/25/2023 2132   BASOSABS 0.1 08/31/2022 0954    No results found for: POCLITH, LITHIUM   No results found for: PHENYTOIN, PHENOBARB, VALPROATE, CBMZ   .res Assessment: Plan:    Gwendolyn Carrillo was seen today for follow-up, anxiety, depression and  stress.  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  The patient experienced the typical dissociation which gradually resolved over the 2-hour period of observation.  There were no complications.  Specifically the patient did not have nausea or vomiting or headache.  Pre-treatment BP: 150/97; repeat BP:148/96. Received clonidine 0.1 mg PO prior to treatment. 40 minutes post: BP 137/94. End of treatment: BP 145/90.  By the time the 2-hour observation period was met the patient was alert and oriented and able to exit without assistance. Patient feels the Spravato  administration is helpful for the treatment resistant depression and would like to continue the treatment.  See nursing note for further details.  Requests weekly Spravato  sessions on Thursdays for ongoing treatment.   PDMP reviewed. High risk trend. LF of Xanax  - 05/03/2023 Continue Alprazolam  1 mg BID PRN anxiety and panic attacks Continue Hydroxyzine  25mg  TID PRN Mirapex  .25mg   Pt discontinued Pramipexole  0.25 mg AM. Reports that it is no longer effective.   Discussed potential benefits, risk, and side effects of benzodiazepines to include potential risk of tolerance and dependence, as well as possible drowsiness.  Advised patient not to drive if experiencing drowsiness and to take lowest possible effective dose to minimize risk of dependence and tolerance. Continuation of psychotherapy was strongly recommended as important adjunct treatment to medication    FOLLOW UP:  Weekly (Thursday) for Spravato  administration   Jenna Ardoin PA-C, DMSc  Please see After Visit Summary for patient specific instructions.  Future Appointments  Date Time Provider Department Center  08/09/2024  1:00 PM Thapa, [place], MD [id number] None    No orders of the defined types were placed  in this encounter.   -------------------------------

## 2024-05-10 NOTE — Progress Notes (Signed)
 NURSES NOTE:         Pt arrived for her #22 Spravato  Treatment for treatment resistant depression, pt will be getting 84 mg (3 of the 28 mg inhalers), for continued maintenance dose. Pt sees [employer] and she will follow up with her for office visits and see her at treatment when she is in the office. Explained to pt how the treatments would be scheduled and answered any questions she had today. Pt's Spravato  is billed through buy and bill medically.  Spravato  medication is stored at treatment center per REMS/FDA guidelines. The medication is required to be locked behind two doors per REMS/FDA protocol. Medication is also disposed of properly after each use per regulations. All documentation for REMS is completed and submitted per FDA/REMS requirements.          Pt's last treatment was 04/24/24. Began taking patient's vital signs at 9:40 AM 150/97, pulse 101, SpO2 Sat. 99%, 148/96, pulse 102. Pt given Clonidine 0.1 mg at time of starting treatment.  Instructed patient to blow her nose if needed then recline back to a 45 degree angle. Gave patient first dose 28 mg nasal spray, administered in each nostril as directed and observed by nurse, waited 5 more minutes for the second and third doses. After all doses given pt did not complain of any nausea/vomiting, pt did have a drink to help with the taste of Spravato  it gives the metallic taste. Assessed her 40 minute vitals, 10:35 AM, 137/94, pulse 90, SpO2 95%.  Explained she would be monitored for a total time of 120 minutes after beginning treatment. Discharge vitals were taken at 11:35 AM 145/90, P 89, SpO2 98%. Pt did report prior to treatment she would like to start coming back weekly, which I agreed and Angeline and Dr. Geoffry are aware, as well as her stopping the Pramipexole . Arleta Chatters, PA-C met with pt once her thoughts were clearer to discuss how treatment went. Pt very tearful when she left, pt's birthday is upcoming on the [birth year]st. Pt  appears very down and depressed today. Pt reports she feels like she is struggling being isolated at home all the time. Recommend she go home and sleep or just relax on the couch. No driving, no intense activities. Verbalized understanding. Nurse was with pt a total of 60 minutes for clinical assessment.  Pt will get 84 mg.   (84 mg)  LOT [id number] EXP [id number]

## 2024-05-17 ENCOUNTER — Ambulatory Visit: Admitting: Psychiatry

## 2024-05-17 ENCOUNTER — Ambulatory Visit

## 2024-05-17 VITALS — BP 142/82 | HR 92

## 2024-05-17 DIAGNOSIS — F411 Generalized anxiety disorder: Secondary | ICD-10-CM

## 2024-05-17 DIAGNOSIS — G4700 Insomnia, unspecified: Secondary | ICD-10-CM

## 2024-05-17 DIAGNOSIS — F339 Major depressive disorder, recurrent, unspecified: Secondary | ICD-10-CM

## 2024-05-17 DIAGNOSIS — F410 Panic disorder [episodic paroxysmal anxiety] without agoraphobia: Secondary | ICD-10-CM

## 2024-05-17 DIAGNOSIS — F422 Mixed obsessional thoughts and acts: Secondary | ICD-10-CM

## 2024-05-17 DIAGNOSIS — I10 Essential (primary) hypertension: Secondary | ICD-10-CM

## 2024-05-17 NOTE — Progress Notes (Signed)
 NURSES NOTE:         Pt arrived for her #23 Spravato  Treatment for treatment resistant depression, pt will be getting 84 mg (3 of the 28 mg inhalers), for continued maintenance dose. Pt sees [employer] and she will follow up with her for office visits and see her at treatment when she is in the office. Explained to pt how the treatments would be scheduled and answered any questions she had today. Pt's Spravato  is billed through buy and bill medically.  Spravato  medication is stored at treatment center per REMS/FDA guidelines. The medication is required to be locked behind two doors per REMS/FDA protocol. Medication is also disposed of properly after each use per regulations. All documentation for REMS is completed and submitted per FDA/REMS requirements.          Began taking patient's vital signs at 10:10 AM 142/86, pulse 89, SpO2 Sat. 96%.  Instructed patient to blow her nose if needed then recline back to a 45 degree angle. Gave patient first dose 28 mg nasal spray, administered in each nostril as directed and observed by nurse, waited 5 more minutes for the second and third doses. After all doses given pt did not complain of any nausea/vomiting, pt did have a drink to help with the taste of Spravato  it gives the metallic taste. Assessed her 40 minute vitals, 11:04 AM, 144/93, pulse 90, SpO2 98%.  Explained she would be monitored for a total time of 120 minutes after beginning treatment. Discharge vitals were taken at 12:05 AM 142/82, P 92, SpO2 99%. Recommend she go home and sleep or just relax on the couch. No driving, no intense activities. Verbalized understanding. Nurse was with pt a total of 60 minutes for clinical assessment.  Pt will get 84 mg.   (84 mg)  LOT [id number] EXP [id number]

## 2024-05-21 ENCOUNTER — Encounter: Payer: Self-pay | Admitting: Psychiatry

## 2024-05-21 NOTE — Progress Notes (Signed)
 Gwendolyn Carrillo [id number] 12-15-1975 49 y.o.  Subjective:   Patient ID:  Gwendolyn Carrillo is a 49 y.o. (DOB 04-06-1976) female.  Chief Complaint:  Chief Complaint  Patient presents with   Follow-up   Depression   Anxiety    HPI Gwendolyn Carrillo presents to the office today for follow-up of treatment resistant major depression.  She also has a history of generalized anxiety disorder, OCD, and panic.  She describes a long history of depression and anxiety and has been unable to tolerate most antidepressants.  She is very eager to pursue Spravato  for relief from her depression.    01/31/24 appt noted:  Her PHQ-9 score was 22 today.  She describes symptoms of anhedonia, low motivation, difficulty concentrating, poor productivity, difficulty being around people and wanting to isolate.  She is not suicidal but she has had hopelessness other than this opportunity to pursue Spravato .  She does not feel like she can function at work and is discussing leave of absence with her usual provider [employer], PNP. She was so excited to start treatment today that she had trouble sleeping last night. She received her first administration of Spravato  at 56 mg today.  She had no untoward side effects such as nausea dizziness headache or vomiting.  She did have the expected dissociation but it was not scary.  In fact she experienced a relief from negative emotions such as depression and anxiety while under treatment. She was very drowsy and fell asleep.  She explained it as being due to being sleep deprived.  She is eager to continue the Spravato .  02/02/24 appt noted:  She received her second administration of Spravato  at 56 mg today.  She had no untoward side effects such as nausea dizziness headache or vomiting.  She did have the expected dissociation but it was not scary.  In fact she experienced a relief from negative emotions such as depression and anxiety while under treatment. She was much less  drowsy with it.  Would like to increase dose as planned.  No SI.  Tolerating current tx plan.   02/14/24 appt noted: 2 Spravato  84 mg tx last week as planned Here for Spravato  84 mg today.  Expected dissociation well tolerated without NV, HA.  Some dizziness.  Good emotional relief from negativity immediately from Spravato .  Med: no fluoxetine  bc not tolerated Mood sig better by most in decades with Spravato .  Dep is much better.  Anxiety is better too.  Sleep is ok.  Wants to continue tx.  02/16/24 appt noted:  No psych meds bc not tolerated. Here for Spravato  84 mg today.  Expected dissociation well tolerated without NV, HA.  Some dizziness.  Good emotional relief from negativity immediately from Spravato .  Mood so far dramatically better and anxiety better with Spravato .  No new problems.  Wants to continue Spravato .  02/21/24 appt noted:  No psych meds bc not tolerated. Here for Spravato  84 mg today.  Expected dissociation well tolerated without NV, HA.  Some dizziness.  Good emotional relief from negativity immediately from Spravato .  Dep has gone into remission Spravato  and anxiety is much better .  She is very happy with the tx plan.  No new concerns.    02/28/24 appt noted:  No psych meds bc not tolerated. Here for Spravato  84 mg today.  Expected dissociation well tolerated without NV, HA.  Some dizziness.  Good emotional relief from negativity immediately from Spravato .  Dep unexpectedly worse Saturday and  since.  Having experienced relief she doesn't want to go backwards. There are some stressors contributing to it but she wants to continue Spravato  twice weekly until she gets better.  03/01/24 appt noted: No psych meds bc not tolerated. Here for Spravato  84 mg today.  Expected dissociation well tolerated without NV, HA.  Some dizziness.  Good emotional relief from negativity immediately from Spravato .  Felt need bc relapse dep to increase Spravato  back to twice weekly.   Feels  better about getting second spravato  this week.  She believes it will help get her out of the dep like she was before.  Wants to avoid psych med bc hx intolerance.  BP was high this am and required clonidine 0.1 mg which resolved BP px.  She will discuss with PDP later this month.  03/06/24 appt noted: No psych meds bc not tolerated. Here for Spravato  84 mg today.  Expected dissociation well tolerated without NV, HA.  Some dizziness.  Good emotional relief from negativity immediately from Spravato .  Had hypertension this am prior ot Spravato  and was treated with clonidine 0.1 mg which worked.  Results in nurses note. She will talk with PCP on Friday about this bc it has happened several times. Felt need bc relapse dep to increase Spravato  back to twice weekly.  But dep is improving again.  Feels satisfied with tx and wants to continue current plan.  03/08/24 appt noted: No psych meds bc not tolerated. Here for Spravato  84 mg today.  Expected dissociation well tolerated without NV, HA.  Some dizziness.  Good emotional relief from negativity immediately from Spravato .  BP ok today;  Results in nurses note. She will talk with PCP on Friday about this bc it has happened several times. Dep and anxiety are much better with Spravato  and greatly relieved at the benefit for the first time in many years.  03/27/24 appt noted:  No psych meds bc not tolerated. Here for Spravato  84 mg today.  Expected dissociation well tolerated without NV, HA.  Some dizziness.  Good emotional relief from negativity immediately from Spravato .  Results in nurses note. BP mildly elevated at imes today Received clonidine 0.1 mg today and BP normalized. Mood overall  much better with Spravato .However she does have dips if she waits longer than a few days between Spravato  administration.  She feels the twice weekly is more effective at keeping her depression under control.  She has had a little increase in depression since only  receiving 1 Spravato  administration last week.  There are other factors such as work stress that are contributing to a sense of depression and worry.  She has discussed this with her other provider.  03/29/24 appt noted:  No psych meds except alprazolam  1 mg BID prn;  bc not tolerated. Here for Spravato  84 mg today.  Expected dissociation well tolerated without NV, HA.  Some dizziness.  Good emotional relief from negativity immediately from Spravato .  Results in nurses note. BP intermittently high. Plan: Disc Saffron dosing bc she is interested in this for depression off label. The Saffron dosage is 15 - 30 mg daily.  It ideally should be split between morning and evening but if needed can be taken once daily. Also rec probiotic with B. Longum an d she agrees to consider it, bc it is natural.  04/03/24 appt noted:  Meds:  alprazolam  1 mg BID prn; probiotic. Here for Spravato  84 mg today.  Expected dissociation well tolerated without NV, HA.  Some  dizziness.  Good emotional relief from negativity immediately from Spravato .  Results in nurses note. BP mildly elevated at imes today She reports having had 2 panic attacks recently since the last visit in the office.  These were stress related.  There is family stress occurring.  Also stress of the holidays. She reports that she believes her depression is related to dopamine.  She has low drive and low enjoyment of things that are traditionally enjoyable.  It is hard to fight the depression off. She is willing to take a med that increases dopamine as long as it is not a traditional antidepressant.  She reports traditional antidepressants make her depression worse and even give her suicidal thoughts. She has not started saffron yet but is considering it.  She is hesitant because of the cost.  04/17/24 appt noted:  Meds:  alprazolam  1 mg BID prn; probiotic, pramipexole  0.25 mg  Here for Spravato  84 mg today.  Expected dissociation well tolerated without  NV, HA.  Some dizziness.  Good emotional relief from negativity immediately from Spravato .  Results in nurses note. BP mildly elevated at imes today She reports having had 2 panic attacks recently since the last visit in the office.  These were stress related.  There is family stress occurring.  Also stress of the holidays. She feels like pramipexole  is helping some and willing to try it. Spravato  helping.  04/24/24 appt noted:  Med: pramipexole  0.25 mg AM, alprazolam  1 mg BID prn; probiotic, Here for Spravato  84 mg today.  Expected dissociation well tolerated without NV, HA.  Some dizziness.  Good emotional relief from negativity immediately from Spravato .  Results in nurses note.  BP too hight today and received clonidine 0.1 mg AM prior to Spravato  and it helped.   Dep managed and anxiety is better.  Is facing RTW Monday and nervous about it but plans to do so. Also feels ready to try reducing Spravato  to every other week if possible for convenience DT work.  Aware dep might relapse.  05/17/24 appt noted:  Med:   stopped pramipexole ,  alprazolam  1 mg BID prn Here for Spravato  84 mg today.  Expected dissociation well tolerated without NV, HA.  Some dizziness.  Good emotional relief from negativity immediately from Spravato .  Results in nurses note.  Mood is stressed but fines the Spravato  very helpful for mood and anxiety.  Doesn't want to take other meds.  Previous medications: Zoloft , fluoxetine , others,  Clonazepam    ECT-MADRS    Flowsheet Row Video Visit from 01/05/2024 in [name]  MADRS Total Score 31   GAD-7    Flowsheet Row Office Visit from 08/31/2022 in [facility] Office Visit from 03/03/2022 in [facility] Office Visit from 11/25/2021 in [facility] Office Visit from 08/12/2021 in [facility] Office Visit from 04/01/2021 in [facility]  Total GAD-7 Score 12 14 5 8 16    PHQ2-9    Flowsheet Row Video Visit from 01/05/2024 in [name] Office Visit from 08/31/2022 in [facility] Office Visit from 03/03/2022 in [facility] Office Visit from 02/10/2022 in [facility] Office Visit from 11/25/2021 in [facility]  PHQ-2 Total Score 4 4 6 5 2   PHQ-9 Total Score -- 17 14 13 8    Flowsheet Row ED from 05/26/2023 in  [facility] at [facility] from 12/11/2022 in [facility] Emergency Department at [facility] ED from 05/05/2022 in [facility] Emergency Department at [facility]  C-SSRS RISK CATEGORY No Risk No Risk No Risk     Review of Systems:  Review of Systems  Cardiovascular:  Negative for palpitations.  Gastrointestinal:  Negative for nausea and vomiting.  Neurological:  Negative for dizziness, tremors and weakness.  Psychiatric/Behavioral:  Positive for dysphoric mood. Negative for decreased concentration and sleep disturbance. The patient is nervous/anxious.     Medications: I have reviewed the patient's current medications.  Current Outpatient Medications  Medication Sig Dispense Refill   ALPRAZolam  (XANAX ) 1 MG tablet TAKE 1 TABLET BY MOUTH TWICE A DAY AS NEEDED FOR ANXIETY 60 tablet 2   atorvastatin  (LIPITOR) 80 MG tablet Take 1 tablet (80 mg total) by mouth daily. 90 tablet 1   Blood Glucose Monitoring Suppl (ONETOUCH VERIO) w/Device KIT USE TO CHECK BLOOD SUGAR THREE TIMES DAILY. 1 kit 0   Cholecalciferol (VITAMIN D3) 50 MCG (2000 UT) capsule Take 1 capsule (2,000 Units total) by mouth daily. 90 capsule 1   clindamycin  (CLEOCIN ) 2 % vaginal cream Place 1 Applicatorful vaginally at bedtime. 40 g 0   Continuous Blood Gluc Receiver (DEXCOM G6 RECEIVER) DEVI 1 Bag by Does not apply route in the morning, at noon, and  at bedtime. 1 each 0   Continuous Glucose Sensor (DEXCOM G6 SENSOR) MISC USE AS DIRECTED EVERY 10 DAYS 9 each 2   Continuous Glucose Transmitter (DEXCOM G6 TRANSMITTER) MISC USE AS DIRECTED OVER 90 DAYS 1 each 2   Continuous Glucose Transmitter (DEXCOM G6 TRANSMITTER) MISC USE AS DIRECTED OVER 90 DAYS     cyclobenzaprine  (FLEXERIL ) 10 MG tablet Take 1 tablet (10 mg total) by mouth 2 (two) times daily as needed for muscle spasms. 12 tablet 0   Elastic Bandages & Supports (ELBOW COMPRESSION) MISC 1 Application by Does not apply route daily. 1 each 0   glucose blood (ONETOUCH VERIO) test strip USE TO CHECK BLOOD SUGAR THREE TIMES DAILY. 100 each 2   hydrochlorothiazide  (HYDRODIURIL ) 25 MG tablet Take 1 tablet (25 mg total) by mouth daily. 90 tablet 1   hydrOXYzine  (ATARAX ) 25 MG tablet Take 1 tablet (25 mg total) by mouth 3 (three) times daily as needed. 90 tablet 1   Insulin  Glargine (BASAGLAR  KWIKPEN) 100 UNIT/ML Inject 15 Units into the skin daily. 15 mL 0   insulin  lispro (HUMALOG  KWIKPEN) 100 UNIT/ML KwikPen INJECT up to 12u INTO THE SKIN 3 TIMES DAILY WITH MEALS per sliding scale: 200-250: 4 UNITS, 251-300: 6 UNITS, 301-350: 8 UNITS, 351-400: 10 UNITS,>400:12 UNITS AND CALL NP. Max daily dose: 36u 30 mL 0   Insulin  Pen Needle (PEN NEEDLES) 32G X 5 MM MISC Use to inject sliding scale Humalog  three times daily. 100 each 6   JARDIANCE  10 MG TABS tablet Take 10 mg by mouth daily.     lidocaine  (LIDODERM ) 5 % Place 1 patch onto the skin daily. Remove & Discard patch within 12 hours or as directed by MD 30 patch 0   LINZESS  145 MCG CAPS capsule Take 1 capsule (145 mcg total) by mouth daily as needed (constipation). 90 capsule 1   lisinopril  (ZESTRIL ) 20 MG tablet Take 1 tablet (20 mg total) by mouth daily. 90 tablet 1   metFORMIN (GLUCOPHAGE) 500 MG tablet Take 500 mg by mouth daily with breakfast.     metoCLOPramide  (REGLAN ) 10 MG  tablet Take 10 mg by mouth every 6 (six) hours as needed.      metoprolol  tartrate (LOPRESSOR ) 25 MG tablet TAKE 1 TABLET BY MOUTH TWICE A DAY 30 tablet 0   norethindrone  (MICRONOR ) 0.35 MG tablet Take 1 tablet (0.35 mg total) by mouth daily. 28 tablet 0   omeprazole  (PRILOSEC) 20 MG capsule TAKE 1 CAPSULE BY MOUTH DAILY IF NEEDED FOR GASTRIC REFLUX 90 capsule 0   ondansetron  (ZOFRAN -ODT) 4 MG disintegrating tablet TAKE 1 TABLET BY MOUTH EVERY 8 HOURS AS NEEDED FOR NAUSEA AND VOMITING 12 tablet 0   OneTouch Delica Lancets 33G MISC USE TO CHECK BLOOD SUGAR THREE TIMES DAILY. 100 each 2   telmisartan (MICARDIS) 80 MG tablet Take 1 tablet by mouth daily.     No current facility-administered medications for this visit.    Medication Side Effects: None  Allergies: Not on File  Past Medical History:  Diagnosis Date   Anxiety    Diabetes mellitus without complication (HCC)    Gastroparesis    Hypertension    Major depressive disorder     Past Medical History, Surgical history, Social history, and Family history were reviewed and updated as appropriate.   Please see review of systems for further details on the patient's review from today.   Objective:   Physical Exam:  There were no vitals taken for this visit.  Physical Exam Constitutional:      General: She is not in acute distress. Musculoskeletal:        General: No deformity.  Neurological:     Mental Status: She is alert and oriented to person, place, and time.     Coordination: Coordination normal.  Psychiatric:        Attention and Perception: Attention and perception normal. She does not perceive auditory or visual hallucinations.        Mood and Affect: Mood is anxious. Mood is not depressed. Affect is not angry, tearful or inappropriate.        Speech: Speech normal.        Behavior: Behavior normal.        Thought Content: Thought content normal. Thought content is not paranoid or delusional. Thought content does not include homicidal or suicidal ideation. Thought content does  not include suicidal plan.        Cognition and Memory: Cognition and memory normal.        Judgment: Judgment normal.     Comments: Insight intact Dep off and on with stress. Benefit Spravato .     Lab Review:     Component Value Date/Time   NA 131 (L) 05/25/2023 2132   NA 141 08/31/2022 0954   K 3.5 05/25/2023 2132   CL 94 (L) 05/25/2023 2132   CO2 25 05/25/2023 2132   GLUCOSE 314 (H) 05/25/2023 2132   BUN 10 05/25/2023 2132   BUN 12 08/31/2022 0954   CREATININE 0.87 05/25/2023 2132   CALCIUM  9.5 05/25/2023 2132   PROT 8.4 (H) 05/25/2023 2132   PROT 7.6 08/31/2022 0954   ALBUMIN 4.4 05/25/2023 2132   ALBUMIN 4.3 08/31/2022 0954   AST 71 (H) 05/25/2023 2132   ALT 86 (H) 05/25/2023 2132   ALKPHOS 70 05/25/2023 2132   BILITOT 1.0 05/25/2023 2132   BILITOT 0.3 08/31/2022 0954   GFRNONAA >60 05/25/2023 2132   GFRAA >60 10/10/2019 1441       Component Value Date/Time   WBC 11.8 (H) 05/25/2023 2132   RBC 4.51 05/25/2023 2132  HGB 15.1 (H) 05/25/2023 2132   HGB 13.9 08/31/2022 0954   HCT 42.4 05/25/2023 2132   HCT 41.0 08/31/2022 0954   PLT 187 05/25/2023 2132   PLT 249 08/31/2022 0954   MCV 94.0 05/25/2023 2132   MCV 96 08/31/2022 0954   MCH 33.5 05/25/2023 2132   MCHC 35.6 05/25/2023 2132   RDW 12.5 05/25/2023 2132   RDW 12.6 08/31/2022 0954   LYMPHSABS 0.5 (L) 05/25/2023 2132   LYMPHSABS 2.3 08/31/2022 0954   MONOABS 0.5 05/25/2023 2132   EOSABS 0.2 05/25/2023 2132   EOSABS 0.3 08/31/2022 0954   BASOSABS 0.0 05/25/2023 2132   BASOSABS 0.1 08/31/2022 0954    No results found for: POCLITH, LITHIUM   No results found for: PHENYTOIN, PHENOBARB, VALPROATE, CBMZ   .res Assessment: Plan:    Jamita was seen today for follow-up, depression and anxiety.  Diagnoses and all orders for this visit:  Recurrent major depression resistant to treatment  Generalized anxiety disorder  Panic attacks  Mixed obsessional thoughts and acts  Insomnia,  unspecified type  Primary hypertension      Disc her TRD and the SE Spravato .  She is very excited to be receiving this bc poor tolerance AD and no sig benefit with AD. Markedly better with Spravato   but not 100%.    Patient was administered Spravato  84 mg intranasally today.  The patient experienced the typical dissociation which gradually resolved over the 2-hour period of observation.  There were no complications.  Specifically the patient did not have nausea or vomiting or headache.  Blood pressures remained within normal ranges at the 40-minute and 2-hour follow-up intervals.  By the time the 2-hour observation period was met the patient was alert and oriented and able to exit without assistance.  Patient feels the Spravato  administration is helpful for the treatment resistant depression and would like to continue the treatment.  See nursing note for further details.  BP issues.  Continue to discuss with PCP.  Requires clonidine 0.1 mg prn in office and tolerated well.  Continue  84 mg Spravato  bc helps and well tolerated.      Consider alternatives to traditional AD given hx intolerance.  This might help reduce relapse risk.  Example probiotics, Saffron, low dose lithium.   Disc Saffron dosing bc she is interested in this for depression off label. The Saffron dosage is 15 - 30 mg daily.  It ideally should be split between morning and evening but if needed can be taken once daily.  She has not taken yet DT cost Also rec probiotic with B. Longum  bc it is natural.  She disc belief her depression is dopamine problem.  Disc at length that there is an off label alternative to traditional AD (which she reports make her dep and SI worse) which does increase DA, pramipexole .  Disc SE risks including N, dizzy etc.  but also rare risks hallucinations, impulsivity.   She is very motivated to try something that increases DA but wants to start very low.  Disc dosage range.  She stopped  pramipexole  bc of her desires.  Counseling around other activities that could help dep including exercise, socialization, even routine of work.  Follow-up twice weekly for Spravato  administration  Lorene Macintosh MD, DFAPA  Please see After Visit Summary for patient specific instructions.  Future Appointments  Date Time Provider Department Center  05/24/2024 10:00 AM CP-NURSE [facility] None  05/24/2024 10:00 AM Florina Friends, PA-C [facility] None  08/09/2024  1:00 PM Thapa, [place], MD [facility] None    No orders of the defined types were placed in this encounter.   -------------------------------

## 2024-05-24 ENCOUNTER — Encounter: Admitting: Emergency Medicine

## 2024-05-24 ENCOUNTER — Encounter

## 2024-05-31 ENCOUNTER — Encounter

## 2024-05-31 ENCOUNTER — Encounter: Admitting: Emergency Medicine

## 2024-06-07 ENCOUNTER — Encounter

## 2024-06-07 ENCOUNTER — Encounter: Admitting: Emergency Medicine

## 2024-06-14 ENCOUNTER — Encounter: Admitting: Adult Health

## 2024-06-14 ENCOUNTER — Encounter

## 2024-06-21 ENCOUNTER — Encounter: Admitting: Emergency Medicine

## 2024-06-21 ENCOUNTER — Encounter

## 2024-08-09 ENCOUNTER — Ambulatory Visit: Payer: Self-pay | Admitting: Endocrinology
